# Patient Record
Sex: Male | Born: 1935 | Race: Black or African American | Hispanic: No | Marital: Married | Smoking: Former smoker
Health system: Southern US, Community
[De-identification: ages and names within clinical notes are randomized; demographics above are authoritative.]

## PROBLEM LIST (undated history)

## (undated) DIAGNOSIS — I6529 Occlusion and stenosis of unspecified carotid artery: Secondary | ICD-10-CM

## (undated) DIAGNOSIS — N529 Male erectile dysfunction, unspecified: Secondary | ICD-10-CM

## (undated) DIAGNOSIS — K449 Diaphragmatic hernia without obstruction or gangrene: Secondary | ICD-10-CM

## (undated) DIAGNOSIS — K209 Esophagitis, unspecified without bleeding: Secondary | ICD-10-CM

## (undated) DIAGNOSIS — D126 Benign neoplasm of colon, unspecified: Secondary | ICD-10-CM

## (undated) DIAGNOSIS — K2970 Gastritis, unspecified, without bleeding: Secondary | ICD-10-CM

## (undated) DIAGNOSIS — T7840XA Allergy, unspecified, initial encounter: Secondary | ICD-10-CM

## (undated) DIAGNOSIS — K222 Esophageal obstruction: Secondary | ICD-10-CM

## (undated) DIAGNOSIS — K2990 Gastroduodenitis, unspecified, without bleeding: Secondary | ICD-10-CM

## (undated) DIAGNOSIS — K219 Gastro-esophageal reflux disease without esophagitis: Secondary | ICD-10-CM

## (undated) DIAGNOSIS — E119 Type 2 diabetes mellitus without complications: Secondary | ICD-10-CM

## (undated) DIAGNOSIS — I4891 Unspecified atrial fibrillation: Secondary | ICD-10-CM

## (undated) DIAGNOSIS — C61 Malignant neoplasm of prostate: Secondary | ICD-10-CM

## (undated) DIAGNOSIS — C459 Mesothelioma, unspecified: Secondary | ICD-10-CM

## (undated) DIAGNOSIS — I10 Essential (primary) hypertension: Secondary | ICD-10-CM

## (undated) DIAGNOSIS — Z7189 Other specified counseling: Secondary | ICD-10-CM

## (undated) DIAGNOSIS — K5730 Diverticulosis of large intestine without perforation or abscess without bleeding: Secondary | ICD-10-CM

## (undated) DIAGNOSIS — I639 Cerebral infarction, unspecified: Secondary | ICD-10-CM

## (undated) HISTORY — DX: Esophagitis, unspecified: K20.9

## (undated) HISTORY — DX: Esophageal obstruction: K22.2

## (undated) HISTORY — DX: Benign neoplasm of colon, unspecified: D12.6

## (undated) HISTORY — DX: Diaphragmatic hernia without obstruction or gangrene: K44.9

## (undated) HISTORY — DX: Gastro-esophageal reflux disease without esophagitis: K21.9

## (undated) HISTORY — PX: ESOPHAGOGASTRODUODENOSCOPY (EGD) WITH ESOPHAGEAL DILATION: SHX5812

## (undated) HISTORY — PX: PROSTATE SURGERY: SHX751

## (undated) HISTORY — DX: Esophagitis, unspecified without bleeding: K20.90

## (undated) HISTORY — DX: Male erectile dysfunction, unspecified: N52.9

## (undated) HISTORY — DX: Other specified counseling: Z71.89

## (undated) HISTORY — DX: Type 2 diabetes mellitus without complications: E11.9

## (undated) HISTORY — DX: Malignant neoplasm of prostate: C61

## (undated) HISTORY — DX: Cerebral infarction, unspecified: I63.9

## (undated) HISTORY — DX: Gastritis, unspecified, without bleeding: K29.70

## (undated) HISTORY — DX: Allergy, unspecified, initial encounter: T78.40XA

## (undated) HISTORY — DX: Gastroduodenitis, unspecified, without bleeding: K29.90

## (undated) HISTORY — DX: Mesothelioma, unspecified: C45.9

## (undated) HISTORY — DX: Essential (primary) hypertension: I10

## (undated) HISTORY — DX: Occlusion and stenosis of unspecified carotid artery: I65.29

## (undated) HISTORY — DX: Diverticulosis of large intestine without perforation or abscess without bleeding: K57.30

## (undated) HISTORY — DX: Unspecified atrial fibrillation: I48.91

---

## 2000-01-04 ENCOUNTER — Other Ambulatory Visit: Admission: RE | Admit: 2000-01-04 | Discharge: 2000-01-04 | Payer: Self-pay | Admitting: Urology

## 2000-02-22 ENCOUNTER — Encounter: Admission: RE | Admit: 2000-02-22 | Discharge: 2000-05-22 | Payer: Self-pay | Admitting: Radiation Oncology

## 2000-10-16 ENCOUNTER — Encounter (HOSPITAL_COMMUNITY): Admission: RE | Admit: 2000-10-16 | Discharge: 2001-01-14 | Payer: Self-pay | Admitting: Dentistry

## 2000-10-17 ENCOUNTER — Encounter (HOSPITAL_COMMUNITY): Payer: Self-pay | Admitting: Dentistry

## 2003-03-04 ENCOUNTER — Observation Stay (HOSPITAL_COMMUNITY): Admission: EM | Admit: 2003-03-04 | Discharge: 2003-03-05 | Payer: Self-pay | Admitting: Emergency Medicine

## 2004-01-03 ENCOUNTER — Emergency Department (HOSPITAL_COMMUNITY): Admission: EM | Admit: 2004-01-03 | Discharge: 2004-01-03 | Payer: Self-pay | Admitting: Internal Medicine

## 2004-07-22 ENCOUNTER — Ambulatory Visit: Payer: Self-pay | Admitting: Family Medicine

## 2004-09-13 ENCOUNTER — Ambulatory Visit: Payer: Self-pay | Admitting: Family Medicine

## 2005-01-23 ENCOUNTER — Ambulatory Visit: Payer: Self-pay | Admitting: Family Medicine

## 2005-04-11 ENCOUNTER — Ambulatory Visit: Payer: Self-pay | Admitting: Family Medicine

## 2005-05-02 ENCOUNTER — Ambulatory Visit: Payer: Self-pay | Admitting: Family Medicine

## 2005-06-22 ENCOUNTER — Ambulatory Visit: Payer: Self-pay | Admitting: Family Medicine

## 2005-08-15 ENCOUNTER — Ambulatory Visit: Payer: Self-pay | Admitting: Family Medicine

## 2005-09-21 ENCOUNTER — Ambulatory Visit: Payer: Self-pay | Admitting: Family Medicine

## 2006-04-03 ENCOUNTER — Ambulatory Visit: Payer: Self-pay | Admitting: Family Medicine

## 2006-06-08 ENCOUNTER — Ambulatory Visit: Payer: Self-pay | Admitting: Family Medicine

## 2006-12-12 ENCOUNTER — Ambulatory Visit: Payer: Self-pay | Admitting: Family Medicine

## 2007-01-03 ENCOUNTER — Ambulatory Visit: Payer: Self-pay | Admitting: Family Medicine

## 2007-03-20 DIAGNOSIS — I10 Essential (primary) hypertension: Secondary | ICD-10-CM

## 2007-03-20 DIAGNOSIS — E785 Hyperlipidemia, unspecified: Secondary | ICD-10-CM | POA: Insufficient documentation

## 2007-03-26 ENCOUNTER — Ambulatory Visit: Payer: Self-pay | Admitting: Family Medicine

## 2007-03-26 DIAGNOSIS — N529 Male erectile dysfunction, unspecified: Secondary | ICD-10-CM

## 2007-03-26 DIAGNOSIS — Z8546 Personal history of malignant neoplasm of prostate: Secondary | ICD-10-CM

## 2007-03-27 ENCOUNTER — Encounter: Payer: Self-pay | Admitting: Family Medicine

## 2007-05-22 ENCOUNTER — Ambulatory Visit: Payer: Self-pay | Admitting: Family Medicine

## 2007-05-22 DIAGNOSIS — J45909 Unspecified asthma, uncomplicated: Secondary | ICD-10-CM | POA: Insufficient documentation

## 2007-05-22 DIAGNOSIS — K219 Gastro-esophageal reflux disease without esophagitis: Secondary | ICD-10-CM

## 2007-06-05 ENCOUNTER — Ambulatory Visit: Payer: Self-pay | Admitting: Family Medicine

## 2007-08-15 DIAGNOSIS — D126 Benign neoplasm of colon, unspecified: Secondary | ICD-10-CM

## 2007-08-15 HISTORY — DX: Benign neoplasm of colon, unspecified: D12.6

## 2007-10-15 DIAGNOSIS — R4789 Other speech disturbances: Secondary | ICD-10-CM | POA: Insufficient documentation

## 2007-10-18 ENCOUNTER — Ambulatory Visit: Payer: Self-pay | Admitting: Family Medicine

## 2007-11-12 ENCOUNTER — Ambulatory Visit: Payer: Self-pay | Admitting: Gastroenterology

## 2007-12-02 ENCOUNTER — Ambulatory Visit: Payer: Self-pay | Admitting: Gastroenterology

## 2007-12-02 ENCOUNTER — Encounter: Payer: Self-pay | Admitting: Gastroenterology

## 2007-12-02 ENCOUNTER — Encounter: Payer: Self-pay | Admitting: Family Medicine

## 2008-01-16 ENCOUNTER — Ambulatory Visit: Payer: Self-pay | Admitting: Family Medicine

## 2008-01-16 DIAGNOSIS — J309 Allergic rhinitis, unspecified: Secondary | ICD-10-CM

## 2008-03-25 ENCOUNTER — Encounter: Payer: Self-pay | Admitting: Family Medicine

## 2008-03-26 ENCOUNTER — Ambulatory Visit: Payer: Self-pay | Admitting: Family Medicine

## 2008-03-26 LAB — CONVERTED CEMR LAB
ALT: 20 units/L (ref 0–53)
Albumin: 3.8 g/dL (ref 3.5–5.2)
BUN: 19 mg/dL (ref 6–23)
Basophils Absolute: 0 10*3/uL (ref 0.0–0.1)
Basophils Relative: 0 % (ref 0.0–3.0)
Bilirubin Urine: NEGATIVE
CO2: 27 meq/L (ref 19–32)
Calcium: 9.3 mg/dL (ref 8.4–10.5)
Cholesterol: 187 mg/dL (ref 0–200)
Creatinine, Ser: 1.3 mg/dL (ref 0.4–1.5)
Eosinophils Absolute: 0.1 10*3/uL (ref 0.0–0.7)
Glucose, Urine, Semiquant: NEGATIVE
Hemoglobin: 14.2 g/dL (ref 13.0–17.0)
Lymphocytes Relative: 20.9 % (ref 12.0–46.0)
MCHC: 34.6 g/dL (ref 30.0–36.0)
MCV: 96.3 fL (ref 78.0–100.0)
Neutro Abs: 3.3 10*3/uL (ref 1.4–7.7)
PSA: 0.68 ng/mL (ref 0.10–4.00)
RBC: 4.27 M/uL (ref 4.22–5.81)
TSH: 1.59 microintl units/mL (ref 0.35–5.50)
Total Bilirubin: 0.9 mg/dL (ref 0.3–1.2)
pH: 5.5

## 2008-05-15 ENCOUNTER — Ambulatory Visit: Payer: Self-pay | Admitting: Family Medicine

## 2009-03-30 ENCOUNTER — Ambulatory Visit: Payer: Self-pay | Admitting: Internal Medicine

## 2009-03-30 DIAGNOSIS — M549 Dorsalgia, unspecified: Secondary | ICD-10-CM | POA: Insufficient documentation

## 2009-04-09 ENCOUNTER — Ambulatory Visit: Payer: Self-pay | Admitting: Family Medicine

## 2009-04-09 ENCOUNTER — Telehealth: Payer: Self-pay | Admitting: Family Medicine

## 2009-04-15 ENCOUNTER — Ambulatory Visit: Payer: Self-pay | Admitting: Family Medicine

## 2009-05-26 ENCOUNTER — Ambulatory Visit: Payer: Self-pay | Admitting: Family Medicine

## 2009-08-03 DIAGNOSIS — R1115 Cyclical vomiting syndrome unrelated to migraine: Secondary | ICD-10-CM

## 2009-08-04 ENCOUNTER — Emergency Department (HOSPITAL_COMMUNITY): Admission: EM | Admit: 2009-08-04 | Discharge: 2009-08-04 | Payer: Self-pay | Admitting: Emergency Medicine

## 2009-08-04 ENCOUNTER — Ambulatory Visit: Payer: Self-pay | Admitting: Family Medicine

## 2010-04-20 ENCOUNTER — Telehealth: Payer: Self-pay | Admitting: Family Medicine

## 2010-04-20 ENCOUNTER — Ambulatory Visit: Payer: Self-pay | Admitting: Family Medicine

## 2010-08-29 ENCOUNTER — Encounter: Payer: Self-pay | Admitting: Internal Medicine

## 2010-08-29 ENCOUNTER — Ambulatory Visit
Admission: RE | Admit: 2010-08-29 | Discharge: 2010-08-29 | Payer: Self-pay | Source: Home / Self Care | Attending: Family Medicine | Admitting: Family Medicine

## 2010-08-29 ENCOUNTER — Ambulatory Visit (HOSPITAL_COMMUNITY)
Admission: EM | Admit: 2010-08-29 | Discharge: 2010-08-29 | Payer: Self-pay | Source: Home / Self Care | Admitting: Emergency Medicine

## 2010-08-29 DIAGNOSIS — K222 Esophageal obstruction: Secondary | ICD-10-CM | POA: Insufficient documentation

## 2010-08-30 ENCOUNTER — Telehealth: Payer: Self-pay | Admitting: Internal Medicine

## 2010-08-31 ENCOUNTER — Encounter (INDEPENDENT_AMBULATORY_CARE_PROVIDER_SITE_OTHER): Payer: Self-pay | Admitting: *Deleted

## 2010-08-31 ENCOUNTER — Telehealth: Payer: Self-pay | Admitting: Gastroenterology

## 2010-09-06 ENCOUNTER — Ambulatory Visit
Admission: RE | Admit: 2010-09-06 | Discharge: 2010-09-06 | Payer: Self-pay | Source: Home / Self Care | Attending: Gastroenterology | Admitting: Gastroenterology

## 2010-09-11 LAB — CONVERTED CEMR LAB
ALT: 18 units/L (ref 0–53)
ALT: 22 units/L (ref 0–53)
ALT: 26 units/L (ref 0–53)
AST: 15 units/L (ref 0–37)
AST: 21 units/L (ref 0–37)
Albumin: 3.7 g/dL (ref 3.5–5.2)
Alkaline Phosphatase: 53 units/L (ref 39–117)
Alkaline Phosphatase: 56 units/L (ref 39–117)
BUN: 16 mg/dL (ref 6–23)
BUN: 17 mg/dL (ref 6–23)
Basophils Absolute: 0 10*3/uL (ref 0.0–0.1)
Basophils Absolute: 0 10*3/uL (ref 0.0–0.1)
Basophils Relative: 0.5 % (ref 0.0–1.0)
Bilirubin, Direct: 0.1 mg/dL (ref 0.0–0.3)
Bilirubin, Direct: 0.1 mg/dL (ref 0.0–0.3)
Blood in Urine, dipstick: NEGATIVE
CO2: 30 meq/L (ref 19–32)
Calcium: 9 mg/dL (ref 8.4–10.5)
Calcium: 9.1 mg/dL (ref 8.4–10.5)
Calcium: 9.3 mg/dL (ref 8.4–10.5)
Chloride: 104 meq/L (ref 96–112)
Chloride: 108 meq/L (ref 96–112)
Cholesterol: 203 mg/dL — ABNORMAL HIGH (ref 0–200)
Creatinine, Ser: 1.2 mg/dL (ref 0.4–1.5)
Creatinine, Ser: 1.2 mg/dL (ref 0.4–1.5)
Eosinophils Absolute: 0.2 10*3/uL (ref 0.0–0.6)
Eosinophils Relative: 1.6 % (ref 0.0–5.0)
Eosinophils Relative: 3.4 % (ref 0.0–5.0)
GFR calc Af Amer: 75 mL/min
GFR calc non Af Amer: 62 mL/min
GFR calc non Af Amer: 74.64 mL/min (ref >60)
GFR calc non Af Amer: 78.99 mL/min (ref >60)
Glucose, Bld: 109 mg/dL — ABNORMAL HIGH (ref 70–99)
Glucose, Bld: 99 mg/dL (ref 70–99)
HCT: 39.6 % (ref 39.0–52.0)
HCT: 40.6 % (ref 39.0–52.0)
HDL: 39.7 mg/dL (ref >39.00)
HDL: 46.1 mg/dL (ref >39.00)
Hemoglobin: 14.1 g/dL (ref 13.0–17.0)
Hemoglobin: 14.2 g/dL (ref 13.0–17.0)
LDL Cholesterol: 121 mg/dL — ABNORMAL HIGH (ref 0–99)
Lymphocytes Relative: 19.7 % (ref 12.0–46.0)
Lymphocytes Relative: 21.5 % (ref 12.0–46.0)
Lymphs Abs: 1.2 10*3/uL (ref 0.7–4.0)
MCHC: 35.6 g/dL (ref 30.0–36.0)
MCV: 92.6 fL (ref 78.0–100.0)
Monocytes Absolute: 0.8 10*3/uL — ABNORMAL HIGH (ref 0.2–0.7)
Monocytes Relative: 11.1 % (ref 3.0–12.0)
Monocytes Relative: 12.8 % — ABNORMAL HIGH (ref 3.0–11.0)
Neutro Abs: 3.9 10*3/uL (ref 1.4–7.7)
Neutrophils Relative %: 61.8 % (ref 43.0–77.0)
Platelets: 208 10*3/uL (ref 150.0–400.0)
Platelets: 232 10*3/uL (ref 150–400)
Potassium: 3.6 meq/L (ref 3.5–5.1)
Potassium: 3.7 meq/L (ref 3.5–5.1)
Protein, U semiquant: NEGATIVE
RBC: 4.28 M/uL (ref 4.22–5.81)
RDW: 13 % (ref 11.5–14.6)
RDW: 13.9 % (ref 11.5–14.6)
Sodium: 139 meq/L (ref 135–145)
Sodium: 140 meq/L (ref 135–145)
TSH: 1.18 microintl units/mL (ref 0.35–5.50)
TSH: 1.45 microintl units/mL (ref 0.35–5.50)
TSH: 1.96 microintl units/mL (ref 0.35–5.50)
Total Bilirubin: 0.9 mg/dL (ref 0.3–1.2)
Total Bilirubin: 1 mg/dL (ref 0.3–1.2)
Total Bilirubin: 1.2 mg/dL (ref 0.3–1.2)
Total CHOL/HDL Ratio: 5
Total Protein: 7 g/dL (ref 6.0–8.3)
Triglycerides: 134 mg/dL (ref 0.0–149.0)
Urobilinogen, UA: 0.2
VLDL: 25.8 mg/dL (ref 0.0–40.0)
VLDL: 26.8 mg/dL (ref 0.0–40.0)
WBC Urine, dipstick: NEGATIVE
WBC: 5.9 10*3/uL (ref 4.5–10.5)
WBC: 6.2 10*3/uL (ref 4.5–10.5)

## 2010-09-12 ENCOUNTER — Other Ambulatory Visit: Payer: Self-pay | Admitting: Gastroenterology

## 2010-09-12 ENCOUNTER — Other Ambulatory Visit: Payer: Self-pay | Admitting: Internal Medicine

## 2010-09-12 ENCOUNTER — Ambulatory Visit
Admission: RE | Admit: 2010-09-12 | Discharge: 2010-09-12 | Payer: Self-pay | Source: Home / Self Care | Attending: Gastroenterology | Admitting: Gastroenterology

## 2010-09-12 DIAGNOSIS — K2940 Chronic atrophic gastritis without bleeding: Secondary | ICD-10-CM

## 2010-09-12 DIAGNOSIS — A048 Other specified bacterial intestinal infections: Secondary | ICD-10-CM

## 2010-09-14 ENCOUNTER — Encounter: Payer: Self-pay | Admitting: Gastroenterology

## 2010-09-15 NOTE — Miscellaneous (Signed)
Summary: [facility]  Clinical Lists Changes  Observations: Added new observation of ALLERGY REV: Done (09/06/2010 17:07)

## 2010-09-15 NOTE — Progress Notes (Signed)
Summary: Drug Interaction Please advise with Simvastain & Verapamil  Phone Note From Pharmacy   [facility]  [address] [phone]* Reason for Call: Patient requests substitution Summary of Call: [facility] that Simvastatin 80 MG and Verapamil ER 240 MG may interact based on potential interaction Verapamil may inhibit metabolism of of Simvastatin which may result in rhabdomyolysis. Please Advise.......... Initial call taken by: Kathrynn Speed CMA,  April 20, 2010 1:36 PM  Follow-up for Phone Call        changed simvastatin to Pravachol 80 mg nightly, dispense 100 directions one nightly, refills x 3 Follow-up by: Roderick Pee MD,  April 21, 2010 8:40 AM  Additional Follow-up for Phone Call Additional follow up Details #1::        Called pharmacy informed them to change med to Pravachol 80 mg. Additional Follow-up by: Kathrynn Speed CMA,  April 21, 2010 2:19 PM

## 2010-09-15 NOTE — Procedures (Signed)
Summary: Upper Endoscopy  Patient: Thomas [name] Note: All result statuses are Final unless otherwise noted.  Tests: (1) Upper Endoscopy (EGD)   EGD Upper Endoscopy       DONE     [facility]     [address] [place], [place]  [address]           ENDOSCOPY PROCEDURE REPORT           PATIENT:  [name], [name]  MR#:  [id number]     BIRTHDATE:  September 26, 1925, 84 yrs. old  GENDER:  male           ENDOSCOPIST:  Wilhemina Bonito. Eda Keys, MD     Referred by:  Quita Skye, M.D.           PROCEDURE DATE:  08/29/2010     PROCEDURE:  EGD with foreign body removal     ASA CLASS:  Class II     INDICATIONS:  dysphagia, food impaction           MEDICATIONS:   Fentanyl 50 mcg, Versed 5 mg     TOPICAL ANESTHETIC:  Cetacaine Spray           DESCRIPTION OF PROCEDURE:   After the risks benefits and     alternatives of the procedure were thoroughly explained, informed     consent was obtained.  The Pentax Gastroscope [id number] endoscope     was introduced through the mouth and advanced to the second     portion of the duodenum, without limitations.  The instrument was     slowly withdrawn as the mucosa was fully examined.     <<PROCEDUREIMAGES>>           A meat impaction was present in distal esophagus and gently     advanced into stomch.  An underlying stricture was found in the     distal esophagus as well as macerated esophageal mucosa.  A hiatal     hernia was found.  The stomach was entered and closely examined.     The antrum, angularis, and lesser curvature were well visualized,     including a retroflexed view of the cardia and fundus. The stomach     wall was normally distensable. The scope passed easily through the     pylorus into the duodenum.  The duodenal bulb was normal in     appearance, as was the postbulbar duodenum.    Retroflexed views     revealed the hiatal hernia.    The scope was then withdrawn from     the patient and the procedure completed.        COMPLICATIONS:  None           ENDOSCOPIC IMPRESSION:     1) Meat impaction - relieved endoscopically     2) Stricture in the distal esophagus     3) Esophagitis in the distal esophagus     4) Hiatal hernia     5) Normal stomach     6) Normal duodenum           RECOMMENDATIONS:     1) Clear liquids for 24 hour then soft foods until endoscopy     with dilation can be performed.     2) Prilosec OTC 20mg  twice daily     3) Call  the office to arrange out patient esophageal dilation     with Dr Jarold Motto in about 2 weeks  _____________________________     Wilhemina Bonito. Eda Keys, MD           CC:  Quita Skye, M.D.; Jolee Ewing, Christen Butter; Tha     Patient           n.     [name]:   Wilhemina Bonito. Eda Keys at 08/29/2010 08:57 PM           [name], [phone]  Note: An exclamation mark (!) indicates a result that was not dispersed into the flowsheet. Document Creation Date: 08/29/2010 8:57 PM _______________________________________________________________________  (1) Order result status: Final Collection or observation date-time: 08/29/2010 20:47 Requested date-time:  Receipt date-time:  Reported date-time:  Referring Physician:   Ordering Physician: Fransico Setters [id number]) Specimen Source:  Source: Launa Grill Order Number: [id number] Lab site:

## 2010-09-15 NOTE — Assessment & Plan Note (Signed)
Summary: N/V // RS   Vital Signs:  Patient profile:   77 year old male Weight:      187 pounds Temp:     98.6 degrees F oral BP sitting:   150 / 100  (left arm) Cuff size:   regular  Vitals Entered By: Kern Reap CMA Duncan Dull) (August 29, 2010 4:02 PM) CC: nausea, vomitting   CC:  nausea and vomitting.  History of Present Illness: Thomas [name] is an 77 year old male, who comes in today accompanied by his wife and daughter because last night.  He got something stuck in his esophagus and it will not pass.  He's tried drinking water, but it comes right back up.  Review of systems otherwise negative.  I gave him a sublingual nitroglycerin to see if this would help  After about 3 minutes he felt lightheaded.  He was placed in the supine position.  His legs were elevated.  BP 130/90  Allergies: 1)  ! Penicillin 2)  ! Ace Inhibitors  Past History:  Past medical, surgical, family and social histories (including risk factors) reviewed for relevance to current acute and chronic problems. Past medical, surgical, family and social histories (including risk factors) reviewed, and no changes noted (except as noted below).  Past Medical History: Reviewed history from 01/16/2008 and no changes required. Hypertension ED Hyperlipidemia Abnormal PSA Prostate cancer, hx of radiation treatment Allergic rhinitis  Past Surgical History: Reviewed history from 03/20/2007 and no changes required. Colonoscopy-03/20/2003  Family History: Reviewed history from 10/18/2007 and no changes required. father died at 69 of congestive heart failure mother died in her 10s unknown cause 3 brothers one died in the [place] .  The other two in good healthone sister died of breast cancer  Social History: Reviewed history from 10/18/2007 and no changes required. Former Smoker Retired Tax inspector Married Alcohol use-no Drug use-no Regular exercise-yes  Review of Systems      See HPI  Physical  Exam  General:  Well-developed,well-nourished,in no acute distress; alert,appropriate and cooperative throughout examination Abdomen:  Bowel sounds positive,abdomen soft and non-tender without masses, organomegaly or hernias noted.   Impression & Recommendations:  Problem # 1:  STRICTURE AND STENOSIS OF ESOPHAGUS (ICD-530.3) Assessment New  Complete Medication List: 1)  Clonidine Hcl 0.2 Mg Tabs (Clonidine hcl) .Marland Kitchen.. 1 once daily 2)  Verapamil Hcl Cr 240 Mg Tbcr (Verapamil hcl) .... Take 1 tablet by mouth once a day 3)  Aspirin 325 Mg Tabs (Aspirin) .... Take 1 tablet by mouth once a day 4)  Ditropan Xl 10 Mg Tb24 (Oxybutynin chloride) .... Take 1 tablet by mouth once a day 5)  Hydrochlorothiazide 25 Mg Tabs (Hydrochlorothiazide) .... Take 1 tablet by mouth once a day 6)  Advil  .... As needed 7)  Crestor 20 Mg Tabs (Rosuvastatin calcium) .... Take one tab by mouth at bedtime  Patient Instructions: 1)  go directly to the emergency room since there is a total obstruction of your esophagus   Orders Added: 1)  Est. Patient Level IV [[address]]

## 2010-09-15 NOTE — Progress Notes (Signed)
Summary: Appointments   Phone Note Call from Patient Call back at Home Phone [phone]   Caller: Patients daughter Call For: Dr. Marina Goodell Reason for Call: Talk to Nurse Summary of Call: Pts daughter calling, pt had emergency procedure last night and was told to follow up with Marina Goodell in two weeks Initial call taken by: [place] Zaro,  August 30, 2010 9:53 AM  Follow-up for Phone Call        Pt. is to have dil. in 2 weeks by Dr.Patterson per procedure report. Follow-up by: Teryl Lucy RN,  August 30, 2010 10:19 AM  Additional Follow-up for Phone Call Additional follow up Details #1::        Spoke with patient's daughter, Marily Memos, to inform her of appoinments with Dr Jarold Motto for EGD w/ dil on 09/12/10 and with the Pre Visit RN on 09/05/10.  Also, went over Dr Lamar Sprinkles diet instructions order for OTC Prilosec. Marily Memos will call for further questions. Dr Jarold Motto has not seen this patient since 12/02/2007, when he had a EGD w/ [facility]. Additional Follow-up by: Graciella Freer RN,  August 30, 2010 10:52 AM

## 2010-09-15 NOTE — Letter (Signed)
Summary: EGD Instructions  [facility]  [address]   [place], [place] [address]   Phone: [phone]  Fax: [phone]       NIVIN BRANIFF    1925/10/19    MRN: [id number]       Procedure Day Thomas [name]: Monday, 09-12-10     Arrival Time: 1:00 p.m.     Procedure Time: 2:00 p.m.     Location of Procedure:                     x [facility] (4th Floor)    PREPARATION FOR ENDOSCOPY   On 09-12-10  THE DAY OF THE PROCEDURE:  1.   No solid foods, milk or milk products are allowed after midnight the night before your procedure.  2.   Do not drink anything colored red or purple.  Avoid juices with pulp.  No orange juice.  3.  You may drink clear liquids until 12:00 p.m., which is 2 hours before your procedure.                                                                                                CLEAR LIQUIDS INCLUDE: Water Jello Ice Popsicles Tea (sugar ok, no milk/cream) Powdered fruit flavored drinks Coffee (sugar ok, no milk/cream) Gatorade Juice: apple, white grape, white cranberry  Lemonade Clear bullion, consomm, broth Carbonated beverages (any kind) Strained chicken noodle soup Hard Candy   MEDICATION INSTRUCTIONS  Unless otherwise instructed, you should take regular prescription medications with a small sip of water as early as possible the morning of your procedure.   Additional medication instructions: Do not take HCTZ day of procedure.             OTHER INSTRUCTIONS  You will need a responsible adult at least 75 years of age to accompany you and drive you home.   This person must remain in the waiting room during your procedure.  Wear loose fitting clothing that is easily removed.  Leave jewelry and other valuables at home.  However, you may wish to bring a book to read or an iPod/MP3 player to listen to music as you wait for your procedure to start.  Remove all body piercing jewelry and leave at home.  Total  time from sign-in until discharge is approximately 2-3 hours.  You should go home directly after your procedure and rest.  You can resume normal activities the day after your procedure.  The day of your procedure you should not:   Drive   Make legal decisions   Operate machinery   Drink alcohol   Return to work  You will receive specific instructions about eating, activities and medications before you leave.    The above instructions have been reviewed and explained to me by   Ezra Sites RN  September 06, 2010 4:36 PM     I fully understand and can verbalize these instructions _____________________________ Date _________

## 2010-09-15 NOTE — Progress Notes (Signed)
Summary: triage   Phone Note Call from Patient Call back at cell [phone]   Caller: Daughter Jasmine December Call For: Dr Jarold Motto Reason for Call: Talk to Nurse Summary of Call: Patient's daughter states that her dad  has severe diarrhea since colon on January 16, wants to know if that's normal. Initial call taken by: Tawni Levy,  August 31, 2010 3:18 PM  Follow-up for Phone Call        Spoke with daughter Jasmine December who stated patient reported 3 stools today- he was not present to describe the consistency. He was on a liquid diet until 9PM last night and then started on soft foods. Patient has not started Prilosec, they are at the store and I asked her to  buy Imodium to keep on hand. If patient's stools become watery, take Imodium per box and update [place] tomorrow. Per [name]'s request, r/s Pre Visit appointment. Follow-up by: Graciella Freer RN,  August 31, 2010 4:04 PM

## 2010-09-15 NOTE — Assessment & Plan Note (Signed)
Summary: pt will come in fasting/njr   Vital Signs:  Patient profile:   77 year old male Height:      68 inches Weight:      196 pounds BMI:     29.91 Temp:     98.2 degrees F oral BP sitting:   130 / 80  (left arm) Cuff size:   regular  Vitals Entered By: Kathrynn Speed CMA (April 20, 2010 8:52 AM) CC: cpx, fasting for labs, src Is Patient Diabetic? No   CC:  cpx, fasting for labs, and src.  History of Present Illness: Thomas [name] is an 77 year old male, who comes in today for general physical examination because of underlying hypertension and hyperlipidemia, and BPH.  His hypertension is treated with verapamil 240 mg daily, Lipitor, thiazide 25 mg daily, clonidine, .2 daily.  BP 130/80.  He also takes ditropan  10 mg daily for BPH, and one aspirin tablet.  He also takes Zocor 80 nightly for hyperlipidemia.  He sees urologist once yearly  Tetanus 2010, seasonal flu shot today, Pneumovax 2009, colonoscopy, a couple years ago, normal. Here for Medicare AWV:  1.   Risk factors based on Past M, S, F history:..reviewed in detail.  No change 2.   Physical Activities: walks daily 3.   Depression/mood: good mood.  No depression 4.   Hearing: normal 5.   ADL's: reviewed able to function normally take care of household finances 6.   Fall Risk:  reviewed.  None 7.   Home Safety: reviewed.  No guns in the house 8.   Height, weight, &visual acuity:height weight, normal.  Vision normal 9.   Counseling: continue current medications 10.   Labs ordered based on risk factors: done today 11.           Referral Coordination..........none indicated 12.           Care Plan.........Marland Kitchenreviewed medications 13.            Cognitive Assessment ...........normal mentation, living will power of attorney, done  Preventive Screening-Counseling & Management  Alcohol-Tobacco     Smoking Status: quit     Passive Smoke Exposure: no  Current Medications (verified): 1)  Clonidine Hcl 0.2 Mg Tabs  (Clonidine Hcl) .Marland Kitchen.. 1 Once Daily 2)  Verapamil Hcl Cr 240 Mg  Tbcr (Verapamil Hcl) .... Take 1 Tablet By Mouth Once A Day 3)  Aspirin 325 Mg  Tabs (Aspirin) .... Take 1 Tablet By Mouth Once A Day 4)  Ditropan Xl 10 Mg  Tb24 (Oxybutynin Chloride) .... Take 1 Tablet By Mouth Once A Day 5)  Hydrochlorothiazide 25 Mg  Tabs (Hydrochlorothiazide) .... Take 1 Tablet By Mouth Once A Day 6)  Zocor 80 Mg  Tabs (Simvastatin) .... Take 1 Tablet By Mouth Once A Day 7)  Advil .... As Needed  Allergies (verified): 1)  ! Penicillin 2)  ! Ace Inhibitors  Past History:  Past medical, surgical, family and social histories (including risk factors) reviewed, and no changes noted (except as noted below).  Past Medical History: Reviewed history from 01/16/2008 and no changes required. Hypertension ED Hyperlipidemia Abnormal PSA Prostate cancer, hx of radiation treatment Allergic rhinitis  Past Surgical History: Reviewed history from 03/20/2007 and no changes required. Colonoscopy-03/20/2003  Family History: Reviewed history from 10/18/2007 and no changes required. father died at 66 of congestive heart failure mother died in her 65s unknown cause 3 brothers one died in the [place] .  The other two in good healthone sister died  of breast cancer  Social History: Reviewed history from 10/18/2007 and no changes required. Former Smoker Retired Tax inspector Married Alcohol use-no Drug use-no Regular exercise-yes  Review of Systems      See HPI       Flu Vaccine Consent Questions     Do you have a history of severe allergic reactions to this vaccine? no    Any prior history of allergic reactions to egg and/or gelatin? no    Do you have a sensitivity to the preservative Thimersol? no    Do you have a past history of Guillan-Barre Syndrome? no    Do you currently have an acute febrile illness? no    Have you ever had a severe reaction to latex? no    Vaccine information given and explained to patient?  yes    Are you currently pregnant? no    Lot Number:[id number]   Exp Date:02/11/2011   Site Given  Left Deltoid IM   Physical Exam  General:  Well-developed,well-nourished,in no acute distress; alert,appropriate and cooperative throughout examination Head:  Normocephalic and atraumatic without obvious abnormalities. No apparent alopecia or balding. Eyes:  No corneal or conjunctival inflammation noted. EOMI. Perrla. Funduscopic exam benign, without hemorrhages, exudates or papilledema. Vision grossly normal. Ears:  External ear exam shows no significant lesions or deformities.  Otoscopic examination reveals clear canals, tympanic membranes are intact bilaterally without bulging, retraction, inflammation or discharge. Hearing is grossly normal bilaterally. Nose:  External nasal examination shows no deformity or inflammation. Nasal mucosa are pink and moist without lesions or exudates. Mouth:  Oral mucosa and oropharynx without lesions or exudates.  Teeth in good repair. Neck:  No deformities, masses, or tenderness noted. Chest Wall:  No deformities, masses, tenderness or gynecomastia noted. Breasts:  No masses or gynecomastia noted Lungs:  Normal respiratory effort, chest expands symmetrically. Lungs are clear to auscultation, no crackles or wheezes. Heart:  Normal rate and regular rhythm. S1 and S2 normal without gallop, murmur, click, rub or other extra sounds. Abdomen:  Bowel sounds positive,abdomen soft and non-tender without masses, organomegaly or hernias noted. Rectal:  uro Genitalia:  uro Prostate:  uro Msk:  No deformity or scoliosis noted of thoracic or lumbar spine.   Pulses:  R and L carotid,radial,femoral,dorsalis pedis and posterior tibial pulses are full and equal bilaterally Extremities:  No clubbing, cyanosis, edema, or deformity noted with normal full range of motion of all joints.   Neurologic:  No cranial nerve deficits noted. Station and gait are normal. Plantar reflexes  are down-going bilaterally. DTRs are symmetrical throughout. Sensory, motor and coordinative functions appear intact. Skin:  Intact without suspicious lesions or rashes Cervical Nodes:  No lymphadenopathy noted Axillary Nodes:  No palpable lymphadenopathy Inguinal Nodes:  No significant adenopathy Psych:  Cognition and judgment appear intact. Alert and cooperative with normal attention span and concentration. No apparent delusions, illusions, hallucinations   Impression & Recommendations:  Problem # 1:  HYPERTENSION (ICD-401.9) Assessment Improved  His updated medication list for this problem includes:    Clonidine Hcl 0.2 Mg Tabs (Clonidine hcl) .Marland Kitchen... 1 once daily    Verapamil Hcl Cr 240 Mg Tbcr (Verapamil hcl) .Marland Kitchen... Take 1 tablet by mouth once a day    Hydrochlorothiazide 25 Mg Tabs (Hydrochlorothiazide) .Marland Kitchen... Take 1 tablet by mouth once a day  Orders: EKG w/ Interpretation ([address]) Venipuncture ([address]) Prescription Created Electronically [phone]) Medicare -1st Annual Wellness Visit [phone]) Urinalysis-dipstick only (Medicare patient) (91478GN) TLB-Lipid Panel (80061-LIPID) TLB-BMP (Basic Metabolic Panel-BMET) (80048-METABOL) TLB-CBC  Platelet - w/Differential (85025-CBCD) TLB-Hepatic/Liver Function Pnl (80076-HEPATIC) TLB-TSH (Thyroid Stimulating Hormone) (84443-TSH)  Problem # 2:  HYPERLIPIDEMIA (ICD-272.4) Assessment: Improved  His updated medication list for this problem includes:    Zocor 80 Mg Tabs (Simvastatin) .Marland Kitchen... Take 1 tablet by mouth once a day  Orders: EKG w/ Interpretation ([address]) Venipuncture (54098) Prescription Created Electronically [phone]) Medicare -1st Annual Wellness Visit [phone]) Urinalysis-dipstick only (Medicare patient) (62130QM) TLB-Lipid Panel (80061-LIPID) TLB-BMP (Basic Metabolic Panel-BMET) (80048-METABOL) TLB-CBC Platelet - w/Differential (85025-CBCD) TLB-Hepatic/Liver Function Pnl (80076-HEPATIC) TLB-TSH (Thyroid Stimulating Hormone)  (84443-TSH)  Problem # 3:  Preventive Health Care (ICD-V70.0) Assessment: Unchanged  Complete Medication List: 1)  Clonidine Hcl 0.2 Mg Tabs (Clonidine hcl) .Marland Kitchen.. 1 once daily 2)  Verapamil Hcl Cr 240 Mg Tbcr (Verapamil hcl) .... Take 1 tablet by mouth once a day 3)  Aspirin 325 Mg Tabs (Aspirin) .... Take 1 tablet by mouth once a day 4)  Ditropan Xl 10 Mg Tb24 (Oxybutynin chloride) .... Take 1 tablet by mouth once a day 5)  Hydrochlorothiazide 25 Mg Tabs (Hydrochlorothiazide) .... Take 1 tablet by mouth once a day 6)  Zocor 80 Mg Tabs (Simvastatin) .... Take 1 tablet by mouth once a day 7)  Advil  .... As needed  Other Orders: Admin 1st Vaccine (57846) Flu Vaccine 34yrs + ([address]) Specimen Handling (28413)  Patient Instructions: 1)  Please schedule a follow-up appointment in 1 year. Prescriptions: ZOCOR 80 MG  TABS (SIMVASTATIN) Take 1 tablet by mouth once a day  #100 x 3   Entered and Authorized by:   Roderick Pee MD   Signed by:   Roderick Pee MD on 04/20/2010   Method used:   [facility]        [facility]  [address] [phone]* (retail)       [address]       [place], [place]  [address]       Ph: [phone] or [phone]       Fax: [phone]   RxID:   [id number] HYDROCHLOROTHIAZIDE 25 MG  TABS (HYDROCHLOROTHIAZIDE) Take 1 tablet by mouth once a day  #100 x 3   Entered and Authorized by:   Roderick Pee MD   Signed by:   Roderick Pee MD on 04/20/2010   Method used:   [facility]        [facility]  [address] [phone]* (retail)       [address]       [place], [place]  [address]       Ph: [phone] or [phone]       Fax: [phone]   RxID:   [id number] DITROPAN XL 10 MG  TB24 (OXYBUTYNIN CHLORIDE) Take 1 tablet by mouth once a day  #100 x 3   Entered and Authorized by:   Roderick Pee MD   Signed by:   Roderick Pee MD on 04/20/2010   Method used:   [facility]         [facility]  [address] [phone]* (retail)       [address]       [place], [place]  [address]       Ph: [phone] or [phone]       Fax: [phone]   RxID:   [id number] VERAPAMIL HCL CR 240 MG  TBCR (VERAPAMIL HCL) Take 1 tablet by mouth once a day  #100 x 3   Entered and Authorized by:   Roderick Pee MD   Signed by:   Roderick Pee MD on 04/20/2010   Method used:   [facility]ron[facility] to        [facility]  [address] [phone]* (retail)       [address]       [place], [place]  [address]       Ph: [phone] or [phone]       Fax: [phone]   RxID:   [id number] CLONIDINE HCL 0.2 MG TABS (CLONIDINE HCL) 1 once daily  #100 x 3   Entered and Authorized by:   Roderick Pee MD   Signed by:   Roderick Pee MD on 04/20/2010   Method used:   [facility]        [facility]  [address] [phone]* (retail)       [address]       [place], [place]  [address]       Ph: [phone] or [phone]       Fax: [phone]   RxID:   [id number]     Laboratory Results   Urine Tests  Date/Time Recieved: April 20, 2010 12:11 PM  Date/Time Reported: April 20, 2010 12:11 PM   Routine Urinalysis   Color: yellow Appearance: Clear Glucose: negative   (Normal Range: Negative) Bilirubin: negative   (Normal Range: Negative) Ketone: negative   (Normal Range: Negative) Spec. Gravity: 1.025   (Normal Range: 1.003-1.035) Blood: negative   (Normal Range: Negative) pH: 5.0   (Normal Range: 5.0-8.0) Protein: negative   (Normal Range: Negative) Urobilinogen: 0.2   (Normal Range: 0-1) Nitrite: negative   (Normal Range: Negative) Leukocyte Esterace: negative   (Normal Range: Negative)    Comments: Wynona Canes, CMA  April 20, 2010 12:11 PM

## 2010-09-21 NOTE — Letter (Addendum)
Summary: Patient Notice-Endo Biopsy Results  [facility]  [address] [place], [place] [address]   Phone: [phone]  Fax: [phone]        September 14, 2010 MRN: [id number]    Thomas [name] [address] [place], [place]  [address]    Dear Thomas [name],  I am pleased to inform you that the biopsies taken during your recent endoscopic examination did not show any evidence of cancer upon pathologic examination.  Additional information/recommendations:  __No further action is needed at this time.  Please follow-up with      your primary care physician for your other healthcare needs.  __ Please call [phone] to schedule a return visit to review      your condition.  x__ Continue with the treatment plan as outlined on the day of your      exam.  __ You should have a repeat endoscopic examination for this problem              in _ months/years.   Please call us if you are having persistent problems or have questions about your condition that have not been fully answered at this time.  Sincerely,  Mardella Layman MD [facility]  This letter has been electronically signed by your physician.  Appended Document: Patient Notice-Endo Biopsy Results letter mailed

## 2010-09-21 NOTE — Procedures (Addendum)
Summary: Upper Endoscopy w/DIL  Patient: Thomas [name] Note: All result statuses are Final unless otherwise noted.  Tests: (1) Upper Endoscopy w/DIL (UED)  UED Upper Endoscopy w/DIL                             DONE     [facility]     [address]     [place], [place]  [address]           ENDOSCOPY PROCEDURE REPORT           PATIENT:  [name], [name]  MR#:  [id number]     BIRTHDATE:  10-11-1925, 84 yrs. old  GENDER:  male           ENDOSCOPIST:  Vania Rea. Jarold Motto, MD, Clementeen Graham     ASSISTANT:  Maryan Char           PROCEDURE DATE:  09/12/2010     PROCEDURE:  EGD with biopsy for H. pylori, Esophaggoscopy with     balloon dilation (<12mm), Maloney Dilation of the Esophagus     ASA CLASS:  Class II     INDICATIONS:  1) foreign body  2) dysphagia           MEDICATIONS:   Fentanyl 50 mcg IV, Versed 5 mg IV     TOPICAL ANESTHETIC:  Exactacain Spray           DESCRIPTION OF PROCEDURE:   After the risks benefits and     alternatives of the procedure were thoroughly explained, informed     consent was obtained.  The LB GIF-H180 [id number] endoscope was     introduced through the mouth and advanced to the second portion of     the duodenum, without limitations.  The instrument was slowly     withdrawn as the mucosa was carefully examined.     <<PROCEDUREIMAGES>>           Severe gastritis was found in the body and the antrum of the     stomach. CLO AND REGULAR BIOPSIES DONE.  A hiatal hernia was     found. 5 -7 CM HH NOTED.  A stricture was found at the     gastroesophageal junction. It was circumferential and firm. VERY     TIGHT DISTAL STRICTURE DILATED Maloney dilation was performed. 58F     MALONEY.HARD TO PASS.RESCOPED AND CRE GUIDEWIRE BALLON DILATION     6PSI FOR 2 MTS.SIZE    Dilation was then performed at the     gastroesphageal junction           1) Dilator:  Elease Hashimoto  Size(s):     Resistance:  moderate  Heme:  none     Appearance:  no effect seen     2)  Dilator:  Balloon over guidewire  Size(s):     Resistance:  moderate  Heme:  none     Appearance:  adequate           COMPLICATIONS:  None           ENDOSCOPIC IMPRESSION:     1) Severe gastritis in the body and the antrum of the stomach     2) Hiatal hernia     3) Stricture at the gastroesophageal junction     1.R/O H.PYLORI     2.GERD AND STRICTURE     RECOMMENDATIONS:     1) await pathology results  2) dilatations PRN     3) Rx CLO if positive     4) post dilation instructions     PPI RX.           REPEAT EXAM:  No           ______________________________     Vania Rea. Jarold Motto, MD, Clementeen Graham           CC:  Yancey Flemings, MDJeffrey Shawnie Dapper, MD           n.     [name] at 09/12/2010 02:35 PM           [name], [phone]  Note: An exclamation mark (!) indicates a result that was not dispersed into the flowsheet. Document Creation Date: 09/12/2010 2:36 PM _______________________________________________________________________  (1) Order result status: Final Collection or observation date-time: 09/12/2010 14:21 Requested date-time:  Receipt date-time:  Reported date-time:  Referring Physician:   Ordering Physician: Sheryn Bison [phone]) Specimen Source:  Source: Launa Grill Order Number: [id number] Lab site:

## 2010-09-21 NOTE — Miscellaneous (Signed)
Summary: Orders Update  Clinical Lists Changes  Orders: Added new Test order of TLB-H Pylori Screen Gastric Biopsy (83013-CLOTEST) - Signed 

## 2010-11-14 LAB — DIFFERENTIAL
Basophils Absolute: 0 10*3/uL (ref 0.0–0.1)
Eosinophils Absolute: 0.1 10*3/uL (ref 0.0–0.7)
Lymphocytes Relative: 15 % (ref 12–46)
Lymphs Abs: 1.1 10*3/uL (ref 0.7–4.0)
Neutrophils Relative %: 71 % (ref 43–77)

## 2010-11-14 LAB — LIPASE, BLOOD: Lipase: 16 U/L (ref 11–59)

## 2010-11-14 LAB — URINALYSIS, ROUTINE W REFLEX MICROSCOPIC
Ketones, ur: 40 mg/dL — AB
Leukocytes, UA: NEGATIVE
Nitrite: NEGATIVE
Protein, ur: 30 mg/dL — AB
Urobilinogen, UA: 1 mg/dL (ref 0.0–1.0)
pH: 6 (ref 5.0–8.0)

## 2010-11-14 LAB — COMPREHENSIVE METABOLIC PANEL
ALT: 21 U/L (ref 0–53)
CO2: 23 mEq/L (ref 19–32)
Calcium: 9.2 mg/dL (ref 8.4–10.5)
Chloride: 107 mEq/L (ref 96–112)
Creatinine, Ser: 1.26 mg/dL (ref 0.4–1.5)
GFR calc non Af Amer: 55 mL/min — ABNORMAL LOW (ref >60)
Glucose, Bld: 94 mg/dL (ref 70–99)
Total Bilirubin: 1.5 mg/dL — ABNORMAL HIGH (ref 0.3–1.2)

## 2010-11-14 LAB — POCT I-STAT, CHEM 8
Calcium, Ion: 1.15 mmol/L (ref 1.12–1.32)
Chloride: 110 mEq/L (ref 96–112)
Glucose, Bld: 97 mg/dL (ref 70–99)
HCT: 47 % (ref 39.0–52.0)
Hemoglobin: 16 g/dL (ref 13.0–17.0)

## 2010-11-14 LAB — CBC
HCT: 45.3 % (ref 39.0–52.0)
Hemoglobin: 15.7 g/dL (ref 13.0–17.0)
MCHC: 34.6 g/dL (ref 30.0–36.0)
MCV: 96.5 fL (ref 78.0–100.0)
RBC: 4.7 MIL/uL (ref 4.22–5.81)

## 2010-11-14 LAB — LACTIC ACID, PLASMA: Lactic Acid, Venous: 1.8 mmol/L (ref 0.5–2.2)

## 2010-12-27 NOTE — Assessment & Plan Note (Signed)
[name]                         GASTROENTEROLOGY OFFICE NOTE   Thomas [name], Thomas [name]                      MRN:          [id number]  DATE:11/12/2007                            DOB:          October 28, 1925    Thomas [name] is an 77 year old black male who was referred through the  courtesy of Dr. Tawanna Cooler for midabdominal pain and early satiety.   Thomas [name] for the last 3 months has had some midabdominal pain shortly  after eating with early satiety, some nausea but no real vomiting.  He  really denies true dysphagia, dyspepsia, or hepatobiliary complaints.  We have seen him in the past, and he had a GI bleed from diverticulosis  in 2004.  At the time of his colonoscopy on March 20, 2003, he had a  couple of rather large colon polyps removed.  He has not had followup  since that time.  He was sent a letter for followup colonoscopy in  August of 2007.  In any case, his bowels are moving well, and he denies  melena or hematochezia.  I cannot see where he has had previous  endoscopic exam.   At the time of his colonoscopy, he had some hypotension problems and was  referred to Dr. Chales Abrahams.  He underwent extensive cardiac evaluations,  including stress echocardiograms.  It was felt that the patient's  bradycardia at the time of his colonoscopy was related to hypertension  and hyperlipidemia, and he is currently on multiple medications and has  had no further cardiovascular issues.   The patient's specifically denies reflux symptoms, clay-colored stools,  dark urine, icterus, anorexia, or weight loss.  He denies any specific  food intolerances.  On reviewing his chart, I cannot see previous upper  GI series or ultrasound exams.  He did have recent labs by Dr. Tawanna Cooler  which showed a normal CBC and metabolic profile.   PAST MEDICAL HISTORY:  The patient has essential hypertension and  hypercholesterolemia but otherwise has been in good health and has had  no other  medical problems or surgical procedures.  He does have a  history of BPH, prostate cancer, and has had external beam radiation.  He is followed closely by Dr. Alonza Smoker at this time.   FAMILY HISTORY:  Noncontributory.   SOCIAL HISTORY:  He is married and lives with his wife.  He has a high  school education and is retired from [employer].  Does not smoke and uses  ethanol socially.  He denies problems with ethanol abuse or dependency.   REVIEW OF SYSTEMS:  Noncontributory, except for some excessive urination  and shortness of breath with exertion.  He otherwise denies any active  cardiovascular, pulmonary, neurologic, psychiatric, endocrine,  dermatologic problems.   PHYSICAL EXAMINATION:  GENERAL:  He is a healthy-appearing black male  appearing much younger than his stated age.  VITAL SIGNS:  He is 5 feet 10 inches and weighs 197.6 pounds.  Blood  pressure is 126/82, pulse 60 and regular.  SKIN:  I could not appreciate stigmata of chronic liver disease.  NECK:  No thyromegaly.  CHEST:  Entirely clear.  HEART:  He was in a regular rhythm without murmurs, gallops, or rubs.  ABDOMEN:  I could not appreciate hepatosplenomegaly, abdominal masses,  or tenderness.  He did have a small umbilical hernia noted.  Bowel  sounds were normal.  EXTREMITIES:  Peripheral extremities were unremarkable.  RECTUM:  Inspection of his rectum was unremarkable as was his rectal  exam without masses or tenderness.  There was soft stool present, and it  was guaiac-negative.  NEUROLOGIC:  Mental status was clear.   ASSESSMENT:  1. Thomas [name] has midabdominal pain with early satiety and certainly      suggests a partial gastric outlet obstruction.  He does not abuse      nonsteroidal anti-inflammatory drugs and certainly gives no history      of any dyspepsia or peptic ulcer disease-type symptomatology.  2. History of prominent colon polyps with past due colonoscopy      followup due.  3. Previous  gastrointestinal bleeding from diverticular hemorrhage.  4. Well-controlled essential hypertension and hyperlipidemia.  5. History of prostate carcinoma with radiation therapy.   RECOMMENDATIONS:  1. Outpatient endoscopy and colonoscopy at his convenience.  2. Step 3 gastroparesis diet until workup can be completed.  3. Continue multiple other medications per Dr. Tawanna Cooler.     Vania Rea. Jarold Motto, MD, Caleen Essex, FAGA  Electronically Signed    DRP/MedQ  DD: 11/12/2007  DT: 11/12/2007  Job #: [id number]   cc:   Tinnie Gens A. Tawanna Cooler, MD  Veverly Fells. Vernie Ammons, M.D.

## 2010-12-30 NOTE — Discharge Summary (Signed)
   NAME:  Thomas [name], Thomas [name] NO.:  [id number]   MEDICAL RECORD NO.:  [id number]                   PATIENT TYPE:  INP   LOCATION:  5154                                 FACILITY:  [facility]   PHYSICIAN:  Stacie Glaze, M.D. [facility]           DATE OF BIRTH:  [birth year]   DATE OF ADMISSION:  03/04/2003  DATE OF DISCHARGE:                                 DISCHARGE SUMMARY   DISCHARGE DIAGNOSES:  1. Painless rectal bleeding.  2. Probable diverticular bleed.  3. Anemia.   BRIEF ADMISSION HISTORY:  [name] is a 77 year old African-American male  who presented with a one day history of bright red blood per rectum. The  patient was found to have heme positive stools and a hemoglobin of 11.5.   PAST MEDICAL HISTORY:  1. Hypertension.  2. Benign prostatic hypertrophy.  3. Prostate cancer status post XRT.  4. Hyperlipidemia.  5. Status post colonoscopy in 2001 that was reportedly negative.   HOSPITAL COURSE:  1. Gastrointestinal. The patient presented with self-limited painless rectal     bleeding presumed to be diverticular. The patient has remained     hemodynamically stable. It is not felt that he needed GI evaluation at     this time but should perhaps followup with Dr. Jarold Motto as an     outpatient. The patient was felt to be stable for discharge.   DISCHARGE LABORATORY DATA:  Hemoglobin 10.9, hematocrit 31.2.   MEDICATIONS AT DISCHARGE:  1. Ditropan XL 10 mg q.d.  2. Lipitor 20 mg q.d.  3. Isoptin 240 mg q.d.  4. Aspirin 325 mg q.d. to be held for seven days.    FOLLOW UP:  Follow up with Dr. Tawanna Cooler on Monday, March 09, 2003 at 12:30 p.m.  and should have a CBC drawn at that time and perhaps need for referral to  see Dr. Jarold Motto.     Cornell Barman, P.A. [facility]                  Stacie Glaze, M.D. [facility]    LC/MEDQ  D:  03/05/2003  T:  03/05/2003  Job:  [id number]   cc:   Tinnie Gens A. Tawanna Cooler, M.D. [facility]   Vania Rea. Jarold Motto, M.D. [facility]    cc:   Eugenio Hoes.  Tawanna Cooler, M.D. [facility]   Vania Rea. Jarold Motto, M.D. [facility]

## 2011-01-17 ENCOUNTER — Ambulatory Visit (INDEPENDENT_AMBULATORY_CARE_PROVIDER_SITE_OTHER): Payer: Medicare Other | Admitting: Family Medicine

## 2011-01-17 ENCOUNTER — Encounter: Payer: Self-pay | Admitting: Internal Medicine

## 2011-01-17 DIAGNOSIS — J45909 Unspecified asthma, uncomplicated: Secondary | ICD-10-CM

## 2011-01-17 DIAGNOSIS — J309 Allergic rhinitis, unspecified: Secondary | ICD-10-CM

## 2011-01-17 MED ORDER — PREDNISONE 20 MG PO TABS: ORAL_TABLET | ORAL | Status: DC

## 2011-01-17 NOTE — Patient Instructions (Signed)
Beginning the prednisone today.  Return p.r.n.

## 2011-01-17 NOTE — Progress Notes (Signed)
  Subjective:    Patient ID: Thomas [name], male    DOB: [birth year], 77 y.o.   MRN: [id number]  HPI  Thomas [name] is a delightful, 77 year old male, who comes in today for a flareup of his asthma.  He has chronic allergic rhinitis, for which he takes a daily antihistamine and he has an occasional flare of his asthma.  Last Saturday.  He was mowing the grass, which he has been noted to be allergic to in the past and that evening developed coughing and wheezing.  Review of systems otherwise negative    Review of Systems General and no immunologic review of systems otherwise negative   Objective:   Physical Exam    Well developed, well nourished, male in no acute distress.  Inspiratory rate 12.  No shortness of breath.  HEENT negative.  Neck supple.  No adenopathy.  Lungs were clear except for some mild late expiratory wheezing    Assessment & Plan:  Asthma,,,,,, prednisone burst and taper return p.r.n.

## 2011-04-27 ENCOUNTER — Ambulatory Visit (INDEPENDENT_AMBULATORY_CARE_PROVIDER_SITE_OTHER): Payer: Medicare Other | Admitting: Family Medicine

## 2011-04-27 ENCOUNTER — Encounter: Payer: Self-pay | Admitting: Family Medicine

## 2011-04-27 DIAGNOSIS — Z23 Encounter for immunization: Secondary | ICD-10-CM

## 2011-04-27 DIAGNOSIS — Z0000 Encounter for general adult medical examination without abnormal findings: Secondary | ICD-10-CM

## 2011-04-27 DIAGNOSIS — Z8546 Personal history of malignant neoplasm of prostate: Secondary | ICD-10-CM

## 2011-04-27 DIAGNOSIS — I10 Essential (primary) hypertension: Secondary | ICD-10-CM

## 2011-04-27 DIAGNOSIS — E785 Hyperlipidemia, unspecified: Secondary | ICD-10-CM

## 2011-04-27 LAB — BASIC METABOLIC PANEL
CO2: 27 mEq/L (ref 19–32)
Calcium: 9.4 mg/dL (ref 8.4–10.5)
Creatinine, Ser: 1.2 mg/dL (ref 0.4–1.5)
GFR: 71.51 mL/min (ref >60.00)
Sodium: 141 mEq/L (ref 135–145)

## 2011-04-27 LAB — PSA: PSA: 1.57 ng/mL (ref 0.10–4.00)

## 2011-04-27 LAB — CBC WITH DIFFERENTIAL/PLATELET
Basophils Relative: 0.4 % (ref 0.0–3.0)
Eosinophils Absolute: 0.1 10*3/uL (ref 0.0–0.7)
Eosinophils Relative: 1.6 % (ref 0.0–5.0)
Hemoglobin: 15.2 g/dL (ref 13.0–17.0)
Lymphocytes Relative: 20.3 % (ref 12.0–46.0)
MCHC: 33.3 g/dL (ref 30.0–36.0)
Monocytes Relative: 12.5 % — ABNORMAL HIGH (ref 3.0–12.0)
Neutro Abs: 5.2 10*3/uL (ref 1.4–7.7)
Neutrophils Relative %: 65.2 % (ref 43.0–77.0)
RBC: 4.62 Mil/uL (ref 4.22–5.81)
WBC: 8 10*3/uL (ref 4.5–10.5)

## 2011-04-27 LAB — LIPID PANEL
HDL: 52 mg/dL (ref >39.00)
Total CHOL/HDL Ratio: 4

## 2011-04-27 LAB — HEPATIC FUNCTION PANEL
ALT: 16 U/L (ref 0–53)
AST: 15 U/L (ref 0–37)
Albumin: 4.2 g/dL (ref 3.5–5.2)
Alkaline Phosphatase: 53 U/L (ref 39–117)
Bilirubin, Direct: 0.2 mg/dL (ref 0.0–0.3)
Total Protein: 7.5 g/dL (ref 6.0–8.3)

## 2011-04-27 LAB — POCT URINALYSIS DIPSTICK
Bilirubin, UA: NEGATIVE
Ketones, UA: NEGATIVE
Leukocytes, UA: NEGATIVE
Nitrite, UA: NEGATIVE
pH, UA: 5.5

## 2011-04-27 MED ORDER — VERAPAMIL HCL 240 MG PO TBCR: 240.0000 mg | EXTENDED_RELEASE_TABLET | Freq: Every day | ORAL | Status: DC

## 2011-04-27 MED ORDER — SIMVASTATIN 20 MG PO TABS: 20.0000 mg | ORAL_TABLET | Freq: Every evening | ORAL | Status: DC

## 2011-04-27 MED ORDER — HYDROCHLOROTHIAZIDE 25 MG PO TABS: 25.0000 mg | ORAL_TABLET | Freq: Every day | ORAL | Status: DC

## 2011-04-27 MED ORDER — CLONIDINE HCL 0.2 MG PO TABS: 0.2000 mg | ORAL_TABLET | Freq: Two times a day (BID) | ORAL | Status: DC

## 2011-04-27 MED ORDER — OXYBUTYNIN CHLORIDE ER 10 MG PO TB24: 10.0000 mg | ORAL_TABLET | Freq: Every day | ORAL | Status: DC

## 2011-04-27 NOTE — Patient Instructions (Signed)
Continue your current medications.  I would strongly recommend the shingles.  Vaccine.  Return in one year, sooner if any problem

## 2011-04-27 NOTE — Progress Notes (Signed)
  Subjective:    Patient ID: Thomas [name], male    DOB: [birth year], 77 y.o.   MRN: [id number]  HPI Kyser is a P. 77-year-old male, who comes in today for a Medicare wellness examination.  He is married and a nonsmoker.  He takes Catapres, @BARCODE2D (Error - No data available.)@ b.i.d., Lipitor, thiazide 25 mg daily, verapamil, 240 mg daily for hypertension.  BP 140/90.  He also takes to trip and 10 mg daily because of a history of prostate cancer and outlet obstruction.  He gets routine eye care, hearing normal, regular dental care, colonoscopy, normal, tetanus, 2010, Pneumovax, x 2, information given on shingles, flu shot today.  Cognitive function, normal, he walks on a daily basis, home health safety reviewed.  No issues identified.  No guns in the house.  He does have a healthcare power of attorney and living will.   Review of Systems  Constitutional: Negative.   HENT: Negative.   Eyes: Negative.   Respiratory: Negative.   Cardiovascular: Negative.   Gastrointestinal: Negative.   Genitourinary: Negative.   Musculoskeletal: Negative.   Skin: Negative.   Neurological: Negative.   Hematological: Negative.   Psychiatric/Behavioral: Negative.        Objective:   Physical Exam  Constitutional: He is oriented to person, place, and time. He appears well-developed and well-nourished.  HENT:  Head: Normocephalic and atraumatic.  Right Ear: External ear normal.  Left Ear: External ear normal.  Nose: Nose normal.  Mouth/Throat: Oropharynx is clear and moist.  Eyes: Conjunctivae and EOM are normal. Pupils are equal, round, and reactive to light.  Neck: Normal range of motion. Neck supple. No JVD present. No tracheal deviation present. No thyromegaly present.  Cardiovascular: Normal rate, regular rhythm, normal heart sounds and intact distal pulses.  Exam reveals no gallop and no friction rub.   No murmur heard. Pulmonary/Chest: Effort normal and breath sounds normal. No  stridor. No respiratory distress. He has no wheezes. He has no rales. He exhibits no tenderness.  Abdominal: Soft. Bowel sounds are normal. He exhibits no distension and no mass. There is no tenderness. There is no rebound and no guarding.  Genitourinary: Rectum normal and penis normal. Guaiac negative stool. No penile tenderness.       Prostate surgically removed  Musculoskeletal: Normal range of motion. He exhibits no edema and no tenderness.  Lymphadenopathy:    He has no cervical adenopathy.  Neurological: He is alert and oriented to person, place, and time. He has normal reflexes. No cranial nerve deficit. He exhibits normal muscle tone.  Skin: Skin is warm and dry. No rash noted. No erythema. No pallor.  Psychiatric: He has a normal mood and affect. His behavior is normal. Judgment and thought content normal.          Assessment & Plan:  Healthy male.  Hypertension.  Continue current medications.  Hyperlipidemia.  Continue simvastatin 20 mg daily with aspirin.  Check labs today.  History prostate cancer.  History of outlet obstruction.  Continue Ditropan 10 mg daily.  Flu shot today and recommended shingles.  Vaccine

## 2011-05-10 ENCOUNTER — Emergency Department (HOSPITAL_COMMUNITY)
Admission: EM | Admit: 2011-05-10 | Discharge: 2011-05-10 | Disposition: A | Discharge status: Home / Self Care | Payer: Medicare Other | Attending: Emergency Medicine | Admitting: Emergency Medicine

## 2011-05-10 DIAGNOSIS — IMO0002 Reserved for concepts with insufficient information to code with codable children: Secondary | ICD-10-CM | POA: Insufficient documentation

## 2011-05-10 DIAGNOSIS — K219 Gastro-esophageal reflux disease without esophagitis: Secondary | ICD-10-CM | POA: Insufficient documentation

## 2011-05-10 DIAGNOSIS — R059 Cough, unspecified: Secondary | ICD-10-CM | POA: Insufficient documentation

## 2011-05-10 DIAGNOSIS — T18108A Unspecified foreign body in esophagus causing other injury, initial encounter: Secondary | ICD-10-CM | POA: Insufficient documentation

## 2011-05-10 DIAGNOSIS — I10 Essential (primary) hypertension: Secondary | ICD-10-CM | POA: Insufficient documentation

## 2011-05-10 DIAGNOSIS — C61 Malignant neoplasm of prostate: Secondary | ICD-10-CM | POA: Insufficient documentation

## 2011-05-10 DIAGNOSIS — R05 Cough: Secondary | ICD-10-CM | POA: Insufficient documentation

## 2011-05-10 DIAGNOSIS — E785 Hyperlipidemia, unspecified: Secondary | ICD-10-CM | POA: Insufficient documentation

## 2011-05-11 ENCOUNTER — Telehealth: Payer: Self-pay | Admitting: Gastroenterology

## 2011-05-11 NOTE — Telephone Encounter (Signed)
Pt went to the ER last pm for a meat impaction and after a 3 hour wait, he was able to drink water and eat crackers w/o any intervention per notes and his daughter. Hx of GI Bleed from Diverticulosis, COLON with adenomatous polyp removal, Food impactions requiring removal and EGD with dilations in 2009, 08/29/10 and 09/12/10 and Chronic Active H. Pylori Gastritis in 2009 and 09/12/10. Per Dr Jarold Motto, OK to do Direct EGD on 05/17/11. Daughter, Jasmine December informed and instructions mailed to pt's home with Prep packet.

## 2011-05-17 ENCOUNTER — Other Ambulatory Visit: Payer: Self-pay | Admitting: Gastroenterology

## 2011-05-17 ENCOUNTER — Encounter: Payer: Self-pay | Admitting: Gastroenterology

## 2011-05-17 ENCOUNTER — Ambulatory Visit (AMBULATORY_SURGERY_CENTER): Payer: Medicare Other | Admitting: Gastroenterology

## 2011-05-17 VITALS — BP 109/69 | HR 55 | Temp 97.8°F | Resp 20 | Ht 68.0 in | Wt 190.0 lb

## 2011-05-17 DIAGNOSIS — K219 Gastro-esophageal reflux disease without esophagitis: Secondary | ICD-10-CM | POA: Insufficient documentation

## 2011-05-17 DIAGNOSIS — K222 Esophageal obstruction: Secondary | ICD-10-CM

## 2011-05-17 DIAGNOSIS — R1310 Dysphagia, unspecified: Secondary | ICD-10-CM | POA: Insufficient documentation

## 2011-05-17 MED ORDER — SODIUM CHLORIDE 0.9 % IV SOLN: 500.0000 mL | INTRAVENOUS | Status: DC

## 2011-05-17 MED ORDER — OMEPRAZOLE 40 MG PO CPDR: 40.0000 mg | DELAYED_RELEASE_CAPSULE | Freq: Every day | ORAL | Status: DC

## 2011-05-17 NOTE — Patient Instructions (Signed)
Please review discharge instructions (blue and green sheets)  Follow dilation diet today- see handout  Dr. Norval Gable nurse will call in your anti-reflux medication- take 1 tablet 30 minutes before breakfast daily

## 2011-05-17 NOTE — Progress Notes (Signed)
Dr Jarold Motto asked that patient be given omeprazole 40 mg once daily x 11 refills. Rx has been sent.

## 2011-05-18 ENCOUNTER — Telehealth: Payer: Self-pay | Admitting: *Deleted

## 2011-05-18 NOTE — Telephone Encounter (Signed)
Follow up Call- Patient questions:  Do you have a fever, pain , or abdominal swelling? no Pain Score  0 *  Have you tolerated food without any problems? yes  Have you been able to return to your normal activities? yes  Do you have any questions about your discharge instructions: Diet   no Medications  no Follow up visit  no  Do you have questions or concerns about your Care? yes Pt states that he did great after his procedure. Pt states that his lips are moderately swollen. Educated pt to apply ice to lips intermittently to help with swelling. Advised pt to call back if swelling worsens or doesn't seem to get better or if he has any other problems.  Actions: * If pain score is 4 or above: No action needed, pain <4.

## 2011-06-02 ENCOUNTER — Telehealth: Payer: Self-pay | Admitting: *Deleted

## 2011-06-02 DIAGNOSIS — F0390 Unspecified dementia without behavioral disturbance: Secondary | ICD-10-CM

## 2011-06-02 NOTE — Telephone Encounter (Signed)
[name] is calling to see if her father was checked for dementia at his last CPX, and if not, how can they do this.  He seems to be developing symptoms.

## 2011-06-05 ENCOUNTER — Encounter: Payer: Self-pay | Admitting: Neurology

## 2011-06-05 DIAGNOSIS — F0390 Unspecified dementia without behavioral disturbance: Secondary | ICD-10-CM | POA: Insufficient documentation

## 2011-06-05 NOTE — Telephone Encounter (Signed)
Set up neurologic consult with Dr. Denton Meek

## 2011-06-05 NOTE — Telephone Encounter (Signed)
Daughter notified and referral request sent

## 2011-06-21 ENCOUNTER — Ambulatory Visit: Payer: Medicare Other | Admitting: Neurology

## 2011-09-06 ENCOUNTER — Ambulatory Visit (INDEPENDENT_AMBULATORY_CARE_PROVIDER_SITE_OTHER): Payer: Medicare Other | Admitting: Family

## 2011-09-06 ENCOUNTER — Encounter: Payer: Self-pay | Admitting: Family

## 2011-09-06 VITALS — BP 120/80 | Temp 97.7°F | Wt 194.0 lb

## 2011-09-06 DIAGNOSIS — M1990 Unspecified osteoarthritis, unspecified site: Secondary | ICD-10-CM

## 2011-09-06 DIAGNOSIS — M25529 Pain in unspecified elbow: Secondary | ICD-10-CM

## 2011-09-06 MED ORDER — MELOXICAM 15 MG PO TABS: 15.0000 mg | ORAL_TABLET | Freq: Every day | ORAL | Status: DC

## 2011-09-06 NOTE — Progress Notes (Signed)
Subjective:    Patient ID: Thomas [name], male    DOB: 1925/10/15, 78 y.o.   MRN: [id number]  HPI  78 year old [place] American male, patient of Dr. Tawanna Cooler, nonsmoker is in today with complaint of bilateral elbow pain. The left elbow is more tender and painful than the right. He's taken Tylenol and aspirin that has helped in the past. Recent pain that he 9/10, worse with movement. Describes it as constant and achy.  Denies any numbness or tingling.  Review of Systems  Constitutional: Negative.   Respiratory: Negative.   Cardiovascular: Negative.   Gastrointestinal: Negative.   Musculoskeletal:       Left elbow pain  Skin: Negative.   Neurological: Negative.   Hematological: Negative.   Psychiatric/Behavioral: Negative.    Past Medical History  Diagnosis Date  . Hypertension   . Allergy   . ED (erectile dysfunction)   . Cancer     prostate    History   Social History  . Marital Status: Married    Spouse Name: N/A    Number of Children: N/A  . Years of Education: N/A   Occupational History  . Not on file.   Social History Main Topics  . Smoking status: Former Games developer  . Smokeless tobacco: Not on file  . Alcohol Use: No  . Drug Use: No  . Sexually Active:    Other Topics Concern  . Not on file   Social History Narrative  . No narrative on file    No past surgical history on file.  Family History  Problem Relation Age of Onset  . Heart disease Father     Allergies  Allergen Reactions  . Ace Inhibitors     REACTION: Hives  . Penicillins     REACTION: Swelling-face    Current Outpatient Prescriptions on File Prior to Visit  Medication Sig Dispense Refill  . aspirin 325 MG tablet Take 81 mg by mouth daily.       . cloNIDine (CATAPRES) 0.2 MG tablet Take 1 tablet (0.2 mg total) by mouth 2 (two) times daily.  200 tablet  3  . hydrochlorothiazide 25 MG tablet Take 1 tablet (25 mg total) by mouth daily.  100 tablet  3  . ibuprofen (ADVIL,MOTRIN) 200 MG  tablet Take 200 mg by mouth every 6 (six) hours as needed.        Marland Kitchen omeprazole (PRILOSEC) 40 MG capsule Take 1 capsule (40 mg total) by mouth daily.  30 capsule  11  . oxybutynin (DITROPAN-XL) 10 MG 24 hr tablet Take 1 tablet (10 mg total) by mouth daily.  100 tablet  3  . predniSONE (DELTASONE) 20 MG tablet Two tabs x 3 days, one tab x 3 days, one half a tablet x 3 days, then half a tablet Monday, Wednesday, Friday, for a two week taper  40 tablet  1  . simvastatin (ZOCOR) 20 MG tablet Take 1 tablet (20 mg total) by mouth every evening.  100 tablet  3  . verapamil (CALAN-SR) 240 MG CR tablet Take 1 tablet (240 mg total) by mouth at bedtime.  100 tablet  3    BP 120/80  Temp(Src) 97.7 F (36.5 C) (Oral)  Wt 194 lb (87.998 kg)chart    Objective:   Physical Exam  Constitutional: He is oriented to person, place, and time. He appears well-developed and well-nourished.  Neck: Normal range of motion. Neck supple.  Cardiovascular: Normal rate, regular rhythm and normal heart sounds.   Pulmonary/Chest:  Effort normal and breath sounds normal.  Abdominal: Soft. Bowel sounds are normal.  Musculoskeletal: Normal range of motion.  Neurological: He is alert and oriented to person, place, and time.  Skin: Skin is warm and dry.  Psychiatric: He has a normal mood and affect.          Assessment & Plan:  Assessment: Left elbow pain, likely osteoarthritis  Plan: Mobic 15 mg with food by mouth daily x10-14 days. Osteoarthritis pamphlet given. Patient to call the office is symptoms worsen or persist. Recheck as scheduled when necessary.

## 2011-09-06 NOTE — Patient Instructions (Signed)
Osteoarthritis Osteoarthritis is the most common form of arthritis. It is redness, soreness, and swelling (inflammation) affecting the cartilage. Cartilage acts as a cushion, covering the ends of bones where they meet to form a joint. CAUSES  Over time, the cartilage begins to wear away. This causes bone to rub on bone. This produces pain and stiffness in the affected joints. Factors that contribute to this problem are:  Excessive body weight.   Age.   Overuse of joints.  SYMPTOMS   People with osteoarthritis usually experience joint pain, swelling, or stiffness.   Over time, the joint may lose its normal shape.   Small deposits of bone (osteophytes) may grow on the edges of the joint.   Bits of bone or cartilage can break off and float inside the joint space. This may cause more pain and damage.   Osteoarthritis can lead to depression, anxiety, feelings of helplessness, and limitations on daily activities.  The most commonly affected joints are in the:  Ends of the fingers.   Thumbs.   Neck.   Lower back.   Knees.   Hips.  DIAGNOSIS  Diagnosis is mostly based on your symptoms and exam. Tests may be helpful, including:  X-rays of the affected joint.   A computerized magnetic scan (MRI).   Blood tests to rule out other types of arthritis.   Joint fluid tests. This involves using a needle to draw fluid from the joint and examining the fluid under a microscope.  TREATMENT  Goals of treatment are to control pain, improve joint function, maintain a normal body weight, and maintain a healthy lifestyle. Treatment approaches may include:  A prescribed exercise program with rest and joint relief.   Weight control with nutritional education.   Pain relief techniques such as:   Properly applied heat and cold.   Electric pulses delivered to nerve endings under the skin (transcutaneous electrical nerve stimulation, TENS).   Massage.   Certain supplements. Ask your  caregiver before using any supplements, especially in combination with prescribed drugs.   Medicines to control pain, such as:   Acetaminophen.   Nonsteroidal anti-inflammatory drugs (NSAIDs), such as naproxen.   Narcotic or central-acting agents, such as tramadol. This drug carries a risk of addiction and is generally prescribed for short-term use.   Corticosteroids. These can be given orally or as injection. This is a short-term treatment, not recommended for routine use.   Surgery to reposition the bones and relieve pain (osteotomy) or to remove loose pieces of bone and cartilage. Joint replacement may be needed in advanced states of osteoarthritis.  HOME CARE INSTRUCTIONS  Your caregiver can recommend specific types of exercise. These may include:  Strengthening exercises. These are done to strengthen the muscles that support joints affected by arthritis. They can be performed with weights or with exercise bands to add resistance.   Aerobic activities. These are exercises, such as brisk walking or low-impact aerobics, that get your heart pumping. They can help keep your lungs and circulatory system in shape.   Range-of-motion activities. These keep your joints limber.   Balance and agility exercises. These help you maintain daily living skills.  Learning about your condition and being actively involved in your care will help improve the course of your osteoarthritis. SEEK MEDICAL CARE IF:   You feel hot or your skin turns red.   You develop a rash in addition to your joint pain.   You have an oral temperature above 102 F (38.9 C).  FOR   MORE INFORMATION  National Institute of Arthritis and Musculoskeletal and Skin Diseases: www.niams.nih.gov National Institute on Aging: www.nia.nih.gov American College of Rheumatology: www.rheumatology.org Document Released: 07/31/2005 Document Revised: 04/12/2011 Document Reviewed: 11/11/2009 [employer] Patient Information 2012 [employer]. 

## 2011-10-12 ENCOUNTER — Encounter: Payer: Self-pay | Admitting: Neurology

## 2011-10-12 ENCOUNTER — Ambulatory Visit (INDEPENDENT_AMBULATORY_CARE_PROVIDER_SITE_OTHER): Payer: Medicare Other | Admitting: Neurology

## 2011-10-12 ENCOUNTER — Other Ambulatory Visit (INDEPENDENT_AMBULATORY_CARE_PROVIDER_SITE_OTHER): Payer: Medicare Other

## 2011-10-12 ENCOUNTER — Other Ambulatory Visit: Payer: Self-pay | Admitting: Neurology

## 2011-10-12 DIAGNOSIS — G609 Hereditary and idiopathic neuropathy, unspecified: Secondary | ICD-10-CM

## 2011-10-12 DIAGNOSIS — R7309 Other abnormal glucose: Secondary | ICD-10-CM

## 2011-10-12 LAB — COMPREHENSIVE METABOLIC PANEL
Albumin: 3.9 g/dL (ref 3.5–5.2)
CO2: 26 mEq/L (ref 19–32)
Calcium: 9.2 mg/dL (ref 8.4–10.5)
GFR: 66.46 mL/min (ref >60.00)
Glucose, Bld: 99 mg/dL (ref 70–99)
Potassium: 3.5 mEq/L (ref 3.5–5.1)
Sodium: 138 mEq/L (ref 135–145)
Total Protein: 7.3 g/dL (ref 6.0–8.3)

## 2011-10-12 LAB — CBC WITH DIFFERENTIAL/PLATELET
Basophils Relative: 0.4 % (ref 0.0–3.0)
Eosinophils Relative: 2.1 % (ref 0.0–5.0)
HCT: 42.6 % (ref 39.0–52.0)
Lymphs Abs: 1.4 10*3/uL (ref 0.7–4.0)
Monocytes Relative: 14 % — ABNORMAL HIGH (ref 3.0–12.0)
Neutrophils Relative %: 65.7 % (ref 43.0–77.0)
Platelets: 214 10*3/uL (ref 150.0–400.0)
RBC: 4.46 Mil/uL (ref 4.22–5.81)
WBC: 8.1 10*3/uL (ref 4.5–10.5)

## 2011-10-12 LAB — HEMOGLOBIN A1C: Hgb A1c MFr Bld: 6.7 % — ABNORMAL HIGH (ref 4.6–6.5)

## 2011-10-12 NOTE — Patient Instructions (Signed)
Go to the basement to have your labs drawn today.  . 

## 2011-10-12 NOTE — Progress Notes (Signed)
Dear Dr. Tawanna Cooler,  Thank you for having me see Thomas [name] in consultation today at [facility] for his problem with memory loss.  As you may recall, he is a 78 y.o. year old male with a history of hypertension and prostate cancer.  His wife and daughter accompany him.  He has not noticed a change in cognition.  His daughter and wife say that he is repeating questions and sometimes mentioning the same point during a conversation.  He also seems to be more argumentative at times.  They deny problems with him getting lost, having problems driving.  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  He reads the paper daily and can talk about the current events.  They are concerned about dementia.  He denies hallucinations and has had no bladder incontinence or changes in gait.   He denies depression although his wife thinks he may worry about his prostate cancer.  He lost his son in an aircraft accident in 1992 and he says this weighs on him still but his mood has not changed.  Past Medical History  Diagnosis Date  . Hypertension   . Allergy   . ED (erectile dysfunction)   . Cancer     prostate  - no history of head trauma, stroke or seizure  Past Surgical History  Procedure Date  . Prostate surgery   . Dilation of the esophagus     History   Social History  . Marital Status: Married    Spouse Name: N/A    Number of Children: N/A  . Years of Education: N/A   Social History Main Topics  . Smoking status: Former Games developer  . Smokeless tobacco: Never Used  . Alcohol Use: No     daily--one alcohol drink a day  . Drug Use: No  . Sexually Active: None   Other Topics Concern  . None   Social History Narrative  . None   - while he only admits to one drink a day, his family says that he has more.  Family History  Problem Relation Age of Onset  . Heart disease Father   - no history of dementia.  Current Outpatient  Prescriptions on File Prior to Visit  Medication Sig Dispense Refill  . aspirin 325 MG tablet Take 81 mg by mouth daily.       . cloNIDine (CATAPRES) 0.2 MG tablet Take 1 tablet (0.2 mg total) by mouth 2 (two) times daily.  200 tablet  3  . hydrochlorothiazide 25 MG tablet Take 1 tablet (25 mg total) by mouth daily.  100 tablet  3  . ibuprofen (ADVIL,MOTRIN) 200 MG tablet Take 200 mg by mouth every 6 (six) hours as needed.        Marland Kitchen omeprazole (PRILOSEC) 40 MG capsule Take 1 capsule (40 mg total) by mouth daily.  30 capsule  11  . oxybutynin (DITROPAN-XL) 10 MG 24 hr tablet Take 1 tablet (10 mg total) by mouth daily.  100 tablet  3  . simvastatin (ZOCOR) 20 MG tablet Take 1 tablet (20 mg total) by mouth every evening.  100 tablet  3  . verapamil (CALAN-SR) 240 MG CR tablet Take 1 tablet (240 mg total) by mouth at bedtime.  100 tablet  3  . meloxicam (MOBIC) 15 MG tablet Take 1 tablet (15 mg total) by mouth daily.  30 tablet  0  . predniSONE (DELTASONE)  20 MG tablet Two tabs x 3 days, one tab x 3 days, one half a tablet x 3 days, then half a tablet Monday, Wednesday, Friday, for a two week taper  40 tablet  1    Allergies  Allergen Reactions  . Ace Inhibitors     REACTION: Hives  . Penicillins     REACTION: Swelling-face      ROS:  13 systems were reviewed and are notable for no gait changes or bladder or bowel incontinence. All other review of systems are unremarkable.   Examination:  Filed Vitals:   10/12/11 1428  BP: 140/76  Pulse: 60  Weight: 192 lb (87.091 kg)     In general, well appearing older man.  FRS: - snout, - PM, - glabellar  Cardiovascular: The patient has a regular rate and rhythm and no carotid bruits.  Fundoscopy:  Disks are flat. Vessel caliber within normal limits.  Mental status:   MMSE 25/27, one point lost for counting backwards by 2 from 20 -- patient has a difficult time spelling.  Cranial Nerves: Pupils are equally round and reactive to light.  Visual fields full to confrontation. Extraocular movements are intact without nystagmus. Facial sensation and muscles of mastication are intact. Muscles of facial expression are symmetric. Hearing intact to bilateral finger rub. Tongue protrusion, uvula, palate midline.  Shoulder shrug intact  Motor:  The patient has normal bulk and tone, no pronator drift.  There are no adventitious movements.  5/5 muscle strength bilaterally.  Reflexes:   Biceps  Triceps Brachioradialis Knee Ankle  Right 2+  2+  2+   2+ o  Left  2+  2+  2+   2+ o  Toes down  Coordination:  Normal finger to nose.  No dysdiadokinesia.  Sensation is lost to temp and vibration in length dep manner.  Normal position.  Gait and Station are normal. Romberg negative.   Impression/Recs: 1.  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  While I offered NP testing and MRI I don't think this is essential at this time and the patient would like to wait and see.  The family is ok with this as well.  If he gets worse I would pursue further workup. 2.  ?Peripheral neuropathy - I am have sent off PN labs, including B12 which was not checked for his memory.  He is asymptomatic so I just want to rule out reversible causes   We will see the patient back on as needed basis.  Thank you for having [place] see Thomas [name] in consultation.  Feel free to contact me with any questions.  Lupita Raider Modesto Charon, MD [facility], [place] [address] [place], [place] [address] Phone: [phone] Fax: [phone].

## 2011-10-17 LAB — SPEP & IFE WITH QIG
Alpha-1-Globulin: 6.6 % — ABNORMAL HIGH (ref 2.9–4.9)
Alpha-2-Globulin: 14.4 % — ABNORMAL HIGH (ref 7.1–11.8)
Gamma Globulin: 14.2 % (ref 11.1–18.8)
IgM, Serum: 72 mg/dL (ref 41–251)
Total Protein, Serum Electrophoresis: 7 g/dL (ref 6.0–8.3)

## 2011-10-23 ENCOUNTER — Telehealth: Payer: Self-pay | Admitting: Neurology

## 2011-10-23 NOTE — Telephone Encounter (Signed)
Message copied by Benay Spice on Mon Oct 23, 2011  9:40 AM ------      Message from: Milas Gain      Created: Sun Oct 22, 2011  6:17 PM       Let Mr. Housel or his family know that his HbA1C was slightly high.  This indicates that is glucose has been on the high side.  Not sure if this represents diabetes but he should follow up with Dr. Tawanna Cooler about this.

## 2011-10-23 NOTE — Telephone Encounter (Signed)
Called and spoke with the patient's wife. Information given as per Dr. Modesto Charon re: lab results. I let her know that patient's sugar was a little high and that they should follow up with Dr. Tawanna Cooler about that. She states they will. No issues or concerns voiced at this time.

## 2012-04-25 ENCOUNTER — Encounter: Payer: Self-pay | Admitting: Family Medicine

## 2012-04-25 ENCOUNTER — Ambulatory Visit (INDEPENDENT_AMBULATORY_CARE_PROVIDER_SITE_OTHER): Payer: Medicare Other | Admitting: Family Medicine

## 2012-04-25 VITALS — BP 90/64

## 2012-04-25 DIAGNOSIS — Z23 Encounter for immunization: Secondary | ICD-10-CM

## 2012-04-25 DIAGNOSIS — I959 Hypotension, unspecified: Secondary | ICD-10-CM

## 2012-04-25 DIAGNOSIS — I10 Essential (primary) hypertension: Secondary | ICD-10-CM

## 2012-04-25 DIAGNOSIS — R55 Syncope and collapse: Secondary | ICD-10-CM | POA: Insufficient documentation

## 2012-04-25 NOTE — Progress Notes (Signed)
  Subjective:    Patient ID: Thomas [name], male    DOB: 03-May-1926, 78 y.o.   MRN: [id number]  HPI Thomas [name] is an 78 year old male who comes in today accompanied by his wife for evaluation of her blood pressure. In the exam room he stood up he got lightheaded Thomas [name] fortunately caught him before he hit the table. He was never completely unconscious. He was taken to the treatment room BP was not audible palpable ninety over zero. He states he did not take his blood pressure medication this morning. When asked he says he's taking the verapamil in the morning and at bedtime. He is only supposed be taking 1 tablet a day at bedtime   Review of Systems General and cardiovascular review of systems otherwise negative    Objective:   Physical Exam Well-developed well-nourished male in no acute distress HEENT negative cardiopulmonary exam negative EKG unchanged       Assessment & Plan:  Hypotension probably secondary to double in his medication inadvertently plan hold blood pressure medication today restart on Saturday

## 2012-04-25 NOTE — Patient Instructions (Signed)
Rest at home  Hold it your blood pressure medication today and tomorrow  Restart your blood pressure medication on Friday,,,,,,,, Catapres one in the morning and one at bedtime, hydrochlorothiazide one in the morning, verapamil 1 at bedtime  Return on Monday for followup  Check your blood pressure 3 times daily

## 2012-04-26 ENCOUNTER — Telehealth: Payer: Self-pay | Admitting: Family Medicine

## 2012-04-26 DIAGNOSIS — I10 Essential (primary) hypertension: Secondary | ICD-10-CM

## 2012-04-26 DIAGNOSIS — E785 Hyperlipidemia, unspecified: Secondary | ICD-10-CM

## 2012-04-26 DIAGNOSIS — Z8546 Personal history of malignant neoplasm of prostate: Secondary | ICD-10-CM

## 2012-04-26 MED ORDER — HYDROCHLOROTHIAZIDE 25 MG PO TABS: 25.0000 mg | ORAL_TABLET | Freq: Every day | ORAL | Status: DC

## 2012-04-26 NOTE — Telephone Encounter (Signed)
Pts daughter called and said that her dad needs refill of hydrochlorothiazide 25 MG tablet to [facility] on [address]. Pls call in today.

## 2012-04-29 ENCOUNTER — Encounter: Payer: Self-pay | Admitting: Family Medicine

## 2012-04-29 ENCOUNTER — Ambulatory Visit (INDEPENDENT_AMBULATORY_CARE_PROVIDER_SITE_OTHER): Payer: Medicare Other | Admitting: Family Medicine

## 2012-04-29 ENCOUNTER — Ambulatory Visit: Payer: Medicare Other | Admitting: Family Medicine

## 2012-04-29 VITALS — BP 128/82 | Temp 98.3°F | Wt 178.0 lb

## 2012-04-29 DIAGNOSIS — Z23 Encounter for immunization: Secondary | ICD-10-CM

## 2012-04-29 DIAGNOSIS — R55 Syncope and collapse: Secondary | ICD-10-CM

## 2012-04-29 DIAGNOSIS — I10 Essential (primary) hypertension: Secondary | ICD-10-CM

## 2012-04-29 NOTE — Patient Instructions (Addendum)
Decrease the clonidine to only one tablet daily  Blood pressure check daily in the morning  Return in 3 weeks for followup with all your blood pressure reading

## 2012-04-29 NOTE — Progress Notes (Signed)
  Subjective:    Patient ID: Thomas [name], male    DOB: Sep 17, 1925, 78 y.o.   MRN: [id number]  HPI Thomas [name] is a 78 year old male who comes in today for followup of hypertension and a syncopal episode  When he was here last Thursday with his wife he stood up got lightheaded and passed out partially he was not unconscious he missed getting the exam table because [name] his head. We then placed in the bed rest at home and asked his family to recheck his medication.   Review of Systems General and cardiovascular review of systems otherwise negative no more episodes of syncope    Objective:   Physical Exam  Well-developed well-nourished male in no acute distress BP 120/82      Assessment & Plan:

## 2012-05-03 ENCOUNTER — Telehealth: Payer: Self-pay | Admitting: Family Medicine

## 2012-05-03 NOTE — Telephone Encounter (Signed)
Caller: Jasmine December [name]/Patient; Phone: [phone]; Reason for Call: Daughter states patient was seen on 9/16 and was given a medication list.  She has questions about what medications patient suppose to be taking.  Please call her back.  Thanks

## 2012-05-03 NOTE — Telephone Encounter (Signed)
Spoke with daughter

## 2012-05-10 ENCOUNTER — Other Ambulatory Visit (INDEPENDENT_AMBULATORY_CARE_PROVIDER_SITE_OTHER): Payer: Medicare Other

## 2012-05-10 DIAGNOSIS — Z79899 Other long term (current) drug therapy: Secondary | ICD-10-CM

## 2012-05-10 DIAGNOSIS — Z0000 Encounter for general adult medical examination without abnormal findings: Secondary | ICD-10-CM

## 2012-05-10 DIAGNOSIS — Z125 Encounter for screening for malignant neoplasm of prostate: Secondary | ICD-10-CM

## 2012-05-10 LAB — HEPATIC FUNCTION PANEL
ALT: 13 U/L (ref 0–53)
AST: 13 U/L (ref 0–37)
Albumin: 3.5 g/dL (ref 3.5–5.2)
Alkaline Phosphatase: 44 U/L (ref 39–117)
Total Protein: 6.4 g/dL (ref 6.0–8.3)

## 2012-05-10 LAB — CBC WITH DIFFERENTIAL/PLATELET
Basophils Relative: 0.5 % (ref 0.0–3.0)
HCT: 41.7 % (ref 39.0–52.0)
Hemoglobin: 13.8 g/dL (ref 13.0–17.0)
Lymphocytes Relative: 27.1 % (ref 12.0–46.0)
Lymphs Abs: 1.2 10*3/uL (ref 0.7–4.0)
Monocytes Relative: 13.3 % — ABNORMAL HIGH (ref 3.0–12.0)
Neutro Abs: 2.4 10*3/uL (ref 1.4–7.7)
RBC: 4.18 Mil/uL — ABNORMAL LOW (ref 4.22–5.81)
RDW: 14.4 % (ref 11.5–14.6)

## 2012-05-10 LAB — POCT URINALYSIS DIPSTICK
Blood, UA: NEGATIVE
Ketones, UA: NEGATIVE
Protein, UA: NEGATIVE
Spec Grav, UA: <=1.005
Urobilinogen, UA: 0.2
pH, UA: 5.5

## 2012-05-10 LAB — BASIC METABOLIC PANEL
CO2: 22 mEq/L (ref 19–32)
Calcium: 8.6 mg/dL (ref 8.4–10.5)
GFR: 55.97 mL/min — ABNORMAL LOW (ref >60.00)
Glucose, Bld: 121 mg/dL — ABNORMAL HIGH (ref 70–99)
Potassium: 3.8 mEq/L (ref 3.5–5.1)
Sodium: 136 mEq/L (ref 135–145)

## 2012-05-10 LAB — LDL CHOLESTEROL, DIRECT: Direct LDL: 175.1 mg/dL

## 2012-05-10 LAB — LIPID PANEL
Total CHOL/HDL Ratio: 6
VLDL: 28 mg/dL (ref 0.0–40.0)

## 2012-05-10 LAB — TSH: TSH: 1.23 u[IU]/mL (ref 0.35–5.50)

## 2012-05-16 ENCOUNTER — Encounter: Payer: Self-pay | Admitting: Family Medicine

## 2012-05-16 ENCOUNTER — Ambulatory Visit (INDEPENDENT_AMBULATORY_CARE_PROVIDER_SITE_OTHER): Payer: Medicare Other | Admitting: Family Medicine

## 2012-05-16 VITALS — BP 108/70 | Temp 97.5°F | Wt 186.0 lb

## 2012-05-16 DIAGNOSIS — Z8546 Personal history of malignant neoplasm of prostate: Secondary | ICD-10-CM

## 2012-05-16 DIAGNOSIS — K219 Gastro-esophageal reflux disease without esophagitis: Secondary | ICD-10-CM

## 2012-05-16 DIAGNOSIS — F0390 Unspecified dementia without behavioral disturbance: Secondary | ICD-10-CM

## 2012-05-16 DIAGNOSIS — I10 Essential (primary) hypertension: Secondary | ICD-10-CM

## 2012-05-16 DIAGNOSIS — Z0000 Encounter for general adult medical examination without abnormal findings: Secondary | ICD-10-CM

## 2012-05-16 NOTE — Patient Instructions (Signed)
Continue current medications  Followup in 1 year sooner if any problems 

## 2012-05-16 NOTE — Progress Notes (Signed)
  Subjective:    Patient ID: Thomas [name], male    DOB: 10-Jun-1926, 78 y.o.   MRN: [id number]  HPI Daron Offer is a 78 year old married male nonsmoker who comes in today for a Medicare wellness examination  He has underlying hypertension for which she takes Catapres 0.2 daily, hydrochlorothiazide 25 mg daily, verapamil 240 mg daily. BP this afternoon 108/70 he states he is not lightheaded when he stands up   he takes 200 mg of Motrin twice a day for osteoarthritis  He takes Prilosec 40 mg daily because of history of reflux esophagitis and an esophageal stricture.  He has issues with urinary frequency BPH status post treatment for prostate cancer and takes Ditropan 10 mg daily. He also takes an aspirin tablet daily.   Review of Systems  Constitutional: Negative.   HENT: Negative.   Eyes: Negative.   Respiratory: Negative.   Cardiovascular: Negative.   Gastrointestinal: Negative.   Genitourinary: Negative.   Musculoskeletal: Negative.   Skin: Negative.   Neurological: Negative.   Hematological: Negative.   Psychiatric/Behavioral: Negative.        Objective:   Physical Exam  Constitutional: He is oriented to person, place, and time. He appears well-developed and well-nourished.  HENT:  Head: Normocephalic and atraumatic.  Right Ear: External ear normal.  Left Ear: External ear normal.  Nose: Nose normal.  Mouth/Throat: Oropharynx is clear and moist.  Eyes: Conjunctivae normal and EOM are normal. Pupils are equal, round, and reactive to light.  Neck: Normal range of motion. Neck supple. No JVD present. No tracheal deviation present. No thyromegaly present.  Cardiovascular: Normal rate, regular rhythm, normal heart sounds and intact distal pulses.  Exam reveals no gallop and no friction rub.   No murmur heard. Pulmonary/Chest: Effort normal and breath sounds normal. No stridor. No respiratory distress. He has no wheezes. He has no rales. He exhibits no tenderness.  Abdominal: Soft.  Bowel sounds are normal. He exhibits no distension and no mass. There is no tenderness. There is no rebound and no guarding.  Genitourinary: Rectum normal.       Rectal exam done by urologist  Musculoskeletal: Normal range of motion. He exhibits no edema and no tenderness.  Lymphadenopathy:    He has no cervical adenopathy.  Neurological: He is alert and oriented to person, place, and time. He has normal reflexes. No cranial nerve deficit. He exhibits normal muscle tone.  Skin: Skin is warm and dry. No rash noted. No erythema. No pallor.  Psychiatric: He has a normal mood and affect. His behavior is normal. Judgment and thought content normal.          Assessment & Plan:  Healthy male  History of hypertension continue current medications  Reflux esophagitis with history of esophageal stricture continue Prilosec 40 mg daily  Urinary frequency with history of prostate cancer followed by urology continue Ditropan 10 mg daily  Mild dementia no therapy at this time

## 2012-07-05 ENCOUNTER — Other Ambulatory Visit: Payer: Self-pay | Admitting: Family Medicine

## 2012-07-05 ENCOUNTER — Other Ambulatory Visit: Payer: Self-pay | Admitting: Family

## 2012-07-05 ENCOUNTER — Encounter (INDEPENDENT_AMBULATORY_CARE_PROVIDER_SITE_OTHER): Payer: Medicare Other | Admitting: Ophthalmology

## 2012-07-05 DIAGNOSIS — H35039 Hypertensive retinopathy, unspecified eye: Secondary | ICD-10-CM

## 2012-07-05 DIAGNOSIS — H35379 Puckering of macula, unspecified eye: Secondary | ICD-10-CM

## 2012-07-05 DIAGNOSIS — I10 Essential (primary) hypertension: Secondary | ICD-10-CM

## 2012-07-05 DIAGNOSIS — H43819 Vitreous degeneration, unspecified eye: Secondary | ICD-10-CM

## 2012-07-05 DIAGNOSIS — H33309 Unspecified retinal break, unspecified eye: Secondary | ICD-10-CM

## 2012-07-08 ENCOUNTER — Other Ambulatory Visit: Payer: Self-pay | Admitting: Family Medicine

## 2012-07-09 ENCOUNTER — Encounter (INDEPENDENT_AMBULATORY_CARE_PROVIDER_SITE_OTHER): Payer: Medicare Other | Admitting: Ophthalmology

## 2012-07-09 DIAGNOSIS — H33309 Unspecified retinal break, unspecified eye: Secondary | ICD-10-CM

## 2012-07-15 ENCOUNTER — Encounter: Payer: Self-pay | Admitting: Family Medicine

## 2012-07-15 ENCOUNTER — Ambulatory Visit (INDEPENDENT_AMBULATORY_CARE_PROVIDER_SITE_OTHER): Payer: Medicare Other | Admitting: Family Medicine

## 2012-07-15 VITALS — BP 110/80 | Temp 97.9°F | Wt 186.0 lb

## 2012-07-15 DIAGNOSIS — L509 Urticaria, unspecified: Secondary | ICD-10-CM

## 2012-07-15 MED ORDER — PREDNISONE 20 MG PO TABS: ORAL_TABLET | ORAL | Status: DC

## 2012-07-15 NOTE — Progress Notes (Signed)
  Subjective:    Patient ID: Thomas [name], male    DOB: 07/26/1926, 78 y.o.   MRN: [id number]  HPI Daron Offer  is an 78 year old male who comes in today for evaluation of a skin rash  He states about a week ago he developed red raised swollen periodically since on his arms and legs. No shortness of breath. He does not recall having a problem like this in the past. He is taken no new medications and has not taken anything new dietary wise. He's not had a history of allergic reactions in the past   Review of Systems Review of systems otherwise negative    Objective:   Physical Exam Well-developed well-nourished male in no acute distress examination of the skin shows quarter-sized red raised lesions consistent with urticaria       Assessment & Plan:  Urticaria etiology unknown plan prednisone burst and taper

## 2012-07-15 NOTE — Patient Instructions (Signed)
Take the prednisone as directed return when necessary 

## 2012-07-31 ENCOUNTER — Ambulatory Visit (INDEPENDENT_AMBULATORY_CARE_PROVIDER_SITE_OTHER): Payer: Medicare Other | Admitting: Ophthalmology

## 2012-07-31 DIAGNOSIS — H33309 Unspecified retinal break, unspecified eye: Secondary | ICD-10-CM

## 2012-09-20 ENCOUNTER — Encounter: Payer: Self-pay | Admitting: Internal Medicine

## 2012-09-20 ENCOUNTER — Ambulatory Visit (INDEPENDENT_AMBULATORY_CARE_PROVIDER_SITE_OTHER): Payer: Medicare Other | Admitting: Internal Medicine

## 2012-09-20 ENCOUNTER — Ambulatory Visit (INDEPENDENT_AMBULATORY_CARE_PROVIDER_SITE_OTHER)
Admission: RE | Admit: 2012-09-20 | Discharge: 2012-09-20 | Disposition: A | Discharge status: Home / Self Care | Payer: Medicare Other | Source: Ambulatory Visit | Attending: Internal Medicine | Admitting: Internal Medicine

## 2012-09-20 VITALS — BP 116/80 | HR 61 | Temp 97.6°F | Wt 185.0 lb

## 2012-09-20 DIAGNOSIS — R059 Cough, unspecified: Secondary | ICD-10-CM

## 2012-09-20 DIAGNOSIS — J45909 Unspecified asthma, uncomplicated: Secondary | ICD-10-CM

## 2012-09-20 DIAGNOSIS — R05 Cough: Secondary | ICD-10-CM

## 2012-09-20 DIAGNOSIS — J069 Acute upper respiratory infection, unspecified: Secondary | ICD-10-CM

## 2012-09-20 MED ORDER — HYDROCODONE-HOMATROPINE 5-1.5 MG/5ML PO SYRP: 5.0000 mL | ORAL_SOLUTION | ORAL | Status: DC | PRN

## 2012-09-20 NOTE — Progress Notes (Signed)
Chief Complaint  Patient presents with  . Cough    Has been unable to sleep due to cough.    . Generalized Body Aches    HPI: Chief Complaint  Patient presents with  . Cough    Has been unable to sleep due to cough.    . Generalized Body Aches    HPI: Patient comes in today for SDA for  new problem evaluation. PCP NA today . Comes with son , Cough for 4 days and hard    To stop worse at night has body ache feeling and chills but no specific fever ,  otc no help .  ROS: See pertinent positives and negatives per HPI. No cp sob  Denies dx  Asthma or hemoptysis  Min phlegm . Had flu vaccine this year .   Past Medical History  Diagnosis Date  . Hypertension   . Allergy   . ED (erectile dysfunction)   . Cancer     prostate    Family History  Problem Relation Age of Onset  . Heart disease Father     History   Social History  . Marital Status: Married    Spouse Name: N/A    Number of Children: N/A  . Years of Education: N/A   Social History Main Topics  . Smoking status: Former Games developer  . Smokeless tobacco: Never Used  . Alcohol Use: No     Comment: daily--one alcohol drink a day  . Drug Use: No  . Sexually Active: None   Other Topics Concern  . None   Social History Narrative  . None    Outpatient Encounter Prescriptions as of 09/20/2012  Medication Sig Dispense Refill  . aspirin 325 MG tablet Take 81 mg by mouth daily.       . cloNIDine (CATAPRES) 0.2 MG tablet TAKE 1 TABLET (0.2 MG TOTAL) BY MOUTH 2 (TWO) TIMES DAILY.  200 tablet  4  . hydrochlorothiazide (HYDRODIURIL) 25 MG tablet Take 1 tablet (25 mg total) by mouth daily.  100 tablet  3  . ibuprofen (ADVIL,MOTRIN) 200 MG tablet Take 200 mg by mouth every 6 (six) hours as needed.        . meloxicam (MOBIC) 15 MG tablet TAKE 1 TABLET (15 MG TOTAL) BY MOUTH DAILY.  30 tablet  0  . omeprazole (PRILOSEC) 40 MG capsule Take 40 mg by mouth daily.      . predniSONE (DELTASONE) 20 MG tablet 2 tabs x3 days, 1 tab x3  days, a half a tab x3 days, then a half a tablet Monday Wednesday Friday for a 2 week taper  30 tablet  1  . verapamil (CALAN-SR) 240 MG CR tablet TAKE 1 TABLET (240 MG TOTAL) BY MOUTH AT BEDTIME.  100 tablet  3  . HYDROcodone-homatropine (HYCODAN) 5-1.5 MG/5ML syrup Take 5 mLs by mouth every 4 (four) hours as needed for cough.  180 mL  0   No facility-administered encounter medications on file as of 09/20/2012.    EXAM:  BP 116/80  Pulse 61  Temp(Src) 97.6 F (36.4 C) (Oral)  Wt 185 lb (83.915 kg)  BMI 28.14 kg/m2  SpO2 97%  Body mass index is 28.14 kg/(m^2).  GENERAL: vitals reviewed and listed above, alert, oriented, appears well hydrated and in no acute distress  HEENT: atraumatic, conjunctiva  clear, no obvious abnormalities on inspection of external nose and ears  Nose congested face non tender tms normal  OP : no lesion edema or  exudate  Dentition sub par    NECK: no obvious masses on inspection palpation  No adenopathy   LUNGS: clear to auscultation bilaterally, no wheezes, rales or rhonchi, ? Coarse bs bases    CV: HRRR, no clubbing cyanosis or  peripheral edema nl cap refill   MS: moves all extremities without noticeable focal  abnormality  PSYCH: pleasant and cooperative, no obvious depression or anxiety  ASSESSMENT AND PLAN:  Discussed the following assessment and plan:  1. Cough  DG Chest 2 View   DG Chest 2 View   flu like  sx  had vaccine  r/o pna   2. Upper respiratory infection with cough and congestion    3. ASTHMA, EXTRINSIC NOS     pt says doesnt really have this  but apparently has wheezed int past   disc alarm features   If x ray neg then rx as viral rti at risk follow closely as needed .   Expectant management.  -Patient advised to return or notify health care team  if symptoms worsen or persist or new concerns arise.  Patient Instructions  Get x ray as we discussed .    To make sure no pneumonia  If clear then can take cough med and rest  Cough  may last  10 - 14 days or so but if getting concerned contact us if high fever shortness of breath .    Cough, Adult  A cough is a reflex that helps clear your throat and airways. It can help heal the body or may be a reaction to an irritated airway. A cough may only last 2 or 3 weeks (acute) or may last more than 8 weeks (chronic).  CAUSES Acute cough:  Viral or bacterial infections. Chronic cough:  Infections.  Allergies.  Asthma.  Post-nasal drip.  Smoking.  Heartburn or acid reflux.  Some medicines.  Chronic lung problems (COPD).  Cancer. SYMPTOMS   Cough.  Fever.  Chest pain.  Increased breathing rate.  High-pitched whistling sound when breathing (wheezing).  Colored mucus that you cough up (sputum). TREATMENT   A bacterial cough may be treated with antibiotic medicine.  A viral cough must run its course and will not respond to antibiotics.  Your caregiver may recommend other treatments if you have a chronic cough. HOME CARE INSTRUCTIONS   Only take over-the-counter or prescription medicines for pain, discomfort, or fever as directed by your caregiver. Use cough suppressants only as directed by your caregiver.  Use a cold steam vaporizer or humidifier in your bedroom or home to help loosen secretions.  Sleep in a semi-upright position if your cough is worse at night.  Rest as needed.  Stop smoking if you smoke. SEEK IMMEDIATE MEDICAL CARE IF:   You have pus in your sputum.  Your cough starts to worsen.  You cannot control your cough with suppressants and are losing sleep.  You begin coughing up blood.  You have difficulty breathing.  You develop pain which is getting worse or is uncontrolled with medicine.  You have a fever. MAKE SURE YOU:   Understand these instructions.  Will watch your condition.  Will get help right away if you are not doing well or get worse. Document Released: 01/27/2011 Document Revised: 10/23/2011 Document  Reviewed: 01/27/2011 [facility] Patient Information 2013 [place], [place].      Neta Mends. Ranulfo Kall M.D.

## 2012-09-20 NOTE — Patient Instructions (Addendum)
Get x ray as we discussed .    To make sure no pneumonia  If clear then can take cough med and rest  Cough may last  10 - 14 days or so but if getting concerned contact us if high fever shortness of breath .    Cough, Adult  A cough is a reflex that helps clear your throat and airways. It can help heal the body or may be a reaction to an irritated airway. A cough may only last 2 or 3 weeks (acute) or may last more than 8 weeks (chronic).  CAUSES Acute cough:  Viral or bacterial infections. Chronic cough:  Infections.  Allergies.  Asthma.  Post-nasal drip.  Smoking.  Heartburn or acid reflux.  Some medicines.  Chronic lung problems (COPD).  Cancer. SYMPTOMS   Cough.  Fever.  Chest pain.  Increased breathing rate.  High-pitched whistling sound when breathing (wheezing).  Colored mucus that you cough up (sputum). TREATMENT   A bacterial cough may be treated with antibiotic medicine.  A viral cough must run its course and will not respond to antibiotics.  Your caregiver may recommend other treatments if you have a chronic cough. HOME CARE INSTRUCTIONS   Only take over-the-counter or prescription medicines for pain, discomfort, or fever as directed by your caregiver. Use cough suppressants only as directed by your caregiver.  Use a cold steam vaporizer or humidifier in your bedroom or home to help loosen secretions.  Sleep in a semi-upright position if your cough is worse at night.  Rest as needed.  Stop smoking if you smoke. SEEK IMMEDIATE MEDICAL CARE IF:   You have pus in your sputum.  Your cough starts to worsen.  You cannot control your cough with suppressants and are losing sleep.  You begin coughing up blood.  You have difficulty breathing.  You develop pain which is getting worse or is uncontrolled with medicine.  You have a fever. MAKE SURE YOU:   Understand these instructions.  Will watch your condition.  Will get help right away  if you are not doing well or get worse. Document Released: 01/27/2011 Document Revised: 10/23/2011 Document Reviewed: 01/27/2011 [facility] Patient Information 2013 [place], [place].

## 2012-11-19 ENCOUNTER — Encounter: Payer: Self-pay | Admitting: Family Medicine

## 2012-11-19 ENCOUNTER — Ambulatory Visit (INDEPENDENT_AMBULATORY_CARE_PROVIDER_SITE_OTHER): Payer: Medicare Other | Admitting: Family Medicine

## 2012-11-19 ENCOUNTER — Telehealth: Payer: Self-pay | Admitting: Family Medicine

## 2012-11-19 VITALS — BP 140/90 | Temp 98.7°F | Wt 186.0 lb

## 2012-11-19 DIAGNOSIS — I10 Essential (primary) hypertension: Secondary | ICD-10-CM

## 2012-11-19 LAB — BASIC METABOLIC PANEL
Calcium: 9.2 mg/dL (ref 8.4–10.5)
Creatinine, Ser: 1.1 mg/dL (ref 0.4–1.5)
GFR: 82.67 mL/min (ref >60.00)
Glucose, Bld: 97 mg/dL (ref 70–99)
Sodium: 137 mEq/L (ref 135–145)

## 2012-11-19 NOTE — Telephone Encounter (Signed)
Patient to stop HCTZ and continue potassium and follow up in 1 week.   Patient is aware and an appointment made.

## 2012-11-19 NOTE — Telephone Encounter (Signed)
Pt takes:  Potassium Chloride  1 tablespoon /daily

## 2012-11-19 NOTE — Patient Instructions (Signed)
Continue your current medications  We will call you and we get your lab work back

## 2012-11-19 NOTE — Progress Notes (Signed)
  Subjective:    Patient ID: Thomas [name], male    DOB: [birth year], 79 y.o.   MRN: [id number]  HPI Thomas [name] is an 79 year old male married nonsmoker who comes in today for followup of hypertension  His medications are unchanged except he went to the [place] and they gave him a potassium pill because his serum potassium was 3.0. He cannot swallow pills therefore they switched him to a liquid. He's taken a half a teaspoon daily but does not recall the dose  He feels well no complications BP today 140/90   Review of Systems    review of systems negative Objective:   Physical Exam  Well-developed and nourished male no acute distress BP right arm sitting position 140/90 pulse 70 and regular      Assessment & Plan:  Hypertension at goal continue current therapy  History of hypokalemia recheck potassium

## 2012-11-26 ENCOUNTER — Encounter: Payer: Self-pay | Admitting: Family Medicine

## 2012-11-26 ENCOUNTER — Ambulatory Visit (INDEPENDENT_AMBULATORY_CARE_PROVIDER_SITE_OTHER): Payer: Medicare Other | Admitting: Family Medicine

## 2012-11-26 VITALS — BP 108/80 | Temp 98.6°F | Wt 186.0 lb

## 2012-11-26 DIAGNOSIS — I10 Essential (primary) hypertension: Secondary | ICD-10-CM

## 2012-11-26 DIAGNOSIS — E876 Hypokalemia: Secondary | ICD-10-CM

## 2012-11-26 LAB — BASIC METABOLIC PANEL
Chloride: 101 mEq/L (ref 96–112)
Creatinine, Ser: 1.3 mg/dL (ref 0.4–1.5)
GFR: 69.93 mL/min (ref >60.00)

## 2012-11-26 NOTE — Patient Instructions (Signed)
Take all your medications in the morning,,,,,,,,,, 1 aspirin, one clonidine, 1 Prilosec, 1 verapamil, and one teaspoon of potassium  Labs today  I will call you the report in a couple days  Return in one month for followup sooner if any problems

## 2012-11-26 NOTE — Progress Notes (Signed)
  Subjective:    Patient ID: Thomas [name], male    DOB: 21-Sep-1925, 79 y.o.   MRN: [id number]  HPI Thomas [name] is a 79 year old male who comes in today for followup of hypertension and hypokalemia  We saw him a week ago and his serum potassium was low. We stopped the diuretic and gave him a potassium supplement 20 mEq per teaspoon daily. He comes back today for followup. His blood pressure is 108/80 and he states she's lightheaded when he stands up tonight   Review of Systems    review of systems negative Objective:   Physical Exam  Well-developed male no acute distress BP right arm sitting position 108/80      Assessment & Plan:  Hypertension,,,,,,,,,, BP too low decrease clonidine to 1 daily

## 2012-12-04 ENCOUNTER — Ambulatory Visit (INDEPENDENT_AMBULATORY_CARE_PROVIDER_SITE_OTHER): Payer: Medicare Other | Admitting: Ophthalmology

## 2012-12-26 ENCOUNTER — Ambulatory Visit (INDEPENDENT_AMBULATORY_CARE_PROVIDER_SITE_OTHER): Payer: Medicare Other | Admitting: Family Medicine

## 2012-12-26 ENCOUNTER — Encounter: Payer: Self-pay | Admitting: Family Medicine

## 2012-12-26 VITALS — BP 120/90 | Temp 98.2°F | Wt 190.0 lb

## 2012-12-26 DIAGNOSIS — Z8546 Personal history of malignant neoplasm of prostate: Secondary | ICD-10-CM

## 2012-12-26 NOTE — Progress Notes (Signed)
  Subjective:    Patient ID: Avon Mergenthaler, male    DOB: 1926-03-06, 79 y.o.   MRN: [id number]  HPI Mr. Cortopassi is an 79 year old male who comes in today for followup of hypertension and hypokalemia  He's taking clonidine 0.2 twice a day and verapamil 240 mg daily for hypertension BP 120/90. His blood pressure was elevated because he was not taking his medication on a daily basis. His wife is now monitoring his medications  He had hypokalemia. We gave him potassium supplement. Serum potassium back to normal 3.9   Review of Systems    review of systems negative Objective:   Physical Exam  Well-developed well-nourished male in no acute distress weight is steady 190 BP 120/90 right arm sitting position pulse 70 and regular      Assessment & Plan:  Hypertension at goal continue current therapy

## 2012-12-26 NOTE — Patient Instructions (Signed)
Continue one aspirin tablet daily, clonidine one twice daily, Prilosec 1 daily, verapamil 1 daily  Do not take any over-the-counter anti-inflammatories or Mobic  Check your blood pressure once weekly in the morning  Return when necessary

## 2013-02-15 ENCOUNTER — Encounter: Payer: Self-pay | Admitting: Family Medicine

## 2013-02-15 ENCOUNTER — Ambulatory Visit (INDEPENDENT_AMBULATORY_CARE_PROVIDER_SITE_OTHER): Payer: Medicare Other | Admitting: Family Medicine

## 2013-02-15 VITALS — BP 140/90 | HR 60 | Temp 97.1°F | Wt 185.0 lb

## 2013-02-15 DIAGNOSIS — R42 Dizziness and giddiness: Secondary | ICD-10-CM

## 2013-02-15 MED ORDER — ASPIRIN 325 MG PO TABS: ORAL_TABLET | ORAL | Status: DC

## 2013-02-15 NOTE — Patient Instructions (Addendum)

## 2013-02-15 NOTE — Progress Notes (Signed)
  Subjective:     Thomas [name] is a 79 y.o. male who presents for evaluation of dizziness. The symptoms started several days ago and are worse. The attacks occur frequently and last several  minutes. Positions that worsen symptoms: any motion. Previous workup/treatments: none. Associated ear symptoms: none. Associated CNS symptoms: none. Recent infections: none. Head trauma: denied. Drug ingestion: none. Noise exposure: no occupational exposure. Family history: non-contributory.  The following portions of the patient's history were reviewed and updated as appropriate: allergies, current medications, past family history, past medical history, past social history, past surgical history and problem list.  Review of Systems Pertinent items are noted in HPI.    Objective:    BP 140/90  Pulse 60  Temp(Src) 97.1 F (36.2 C) (Oral)  Wt 185 lb (83.915 kg)  BMI 28.14 kg/m2  SpO2 92% General appearance: alert, cooperative, appears stated age and no distress Head: Normocephalic, without obvious abnormality, atraumatic Ears: normal TM's and external ear canals both ears Nose: Nares normal. Septum midline. Mucosa normal. No drainage or sinus tenderness. Throat: lips, mucosa, and tongue normal; teeth and gums normal Neck: no adenopathy, supple, symmetrical, trachea midline and thyroid not enlarged, symmetric, no tenderness/mass/nodules Lungs: clear to auscultation bilaterally Heart: S1, S2 normal Extremities: extremities normal, atraumatic, no cyanosis or edema Neurologic: Alert and oriented X 3, normal strength and tone. Normal symmetric reflexes. Normal coordination and gait      Assessment:    Vertigo  ---  ? Near syncope with increase falls   Plan:    pt will f/u pcp for vertigo and frequent falling workup Pt  And family instructed to take him to ER if symptoms occur again

## 2013-02-17 ENCOUNTER — Ambulatory Visit (INDEPENDENT_AMBULATORY_CARE_PROVIDER_SITE_OTHER): Payer: Medicare Other | Admitting: Family Medicine

## 2013-02-17 ENCOUNTER — Encounter: Payer: Self-pay | Admitting: Family Medicine

## 2013-02-17 ENCOUNTER — Ambulatory Visit: Payer: Medicare Other | Admitting: Family Medicine

## 2013-02-17 VITALS — BP 130/80 | Temp 98.1°F | Wt 186.0 lb

## 2013-02-17 DIAGNOSIS — I10 Essential (primary) hypertension: Secondary | ICD-10-CM

## 2013-02-17 DIAGNOSIS — I679 Cerebrovascular disease, unspecified: Secondary | ICD-10-CM | POA: Insufficient documentation

## 2013-02-17 DIAGNOSIS — R55 Syncope and collapse: Secondary | ICD-10-CM

## 2013-02-17 DIAGNOSIS — G459 Transient cerebral ischemic attack, unspecified: Secondary | ICD-10-CM

## 2013-02-17 LAB — CBC WITH DIFFERENTIAL/PLATELET
Basophils Relative: 0.2 % (ref 0.0–3.0)
Eosinophils Relative: 1.8 % (ref 0.0–5.0)
Lymphocytes Relative: 18 % (ref 12.0–46.0)
MCV: 101.3 fl — ABNORMAL HIGH (ref 78.0–100.0)
Monocytes Absolute: 1 10*3/uL (ref 0.1–1.0)
Monocytes Relative: 17.1 % — ABNORMAL HIGH (ref 3.0–12.0)
Neutrophils Relative %: 62.9 % (ref 43.0–77.0)
RBC: 4.26 Mil/uL (ref 4.22–5.81)
WBC: 5.8 10*3/uL (ref 4.5–10.5)

## 2013-02-17 LAB — BASIC METABOLIC PANEL
BUN: 20 mg/dL (ref 6–23)
Chloride: 104 mEq/L (ref 96–112)
Glucose, Bld: 120 mg/dL — ABNORMAL HIGH (ref 70–99)
Potassium: 3.5 mEq/L (ref 3.5–5.1)

## 2013-02-17 LAB — TSH: TSH: 1.65 u[IU]/mL (ref 0.35–5.50)

## 2013-02-17 NOTE — Progress Notes (Signed)
  Subjective:    Patient ID: Thomas [name], male    DOB: 04/22/1926, 79 y.o.   MRN: [id number]  HPI Thomas [name] is a delightful 79 year old married male nonsmoker who comes in today accompanied by his wife and 2 sons for evaluation of 2 problems  He states that 2 weeks ago he was standing in the kitchen and had been upright for a while. He felt lightheaded and passed out and hurt his back. He did not go to the hospital. He woke up felt fine then 2 days ago on July 5 he was standing in the kitchen making coffee and he noticed a sudden onset of numbness in his right arm and right leg. He had no visual difficulty speech difficulty etc. He sat down about 15 minutes later the numbness went away.  He's had a history of syncope in the past and we've had to readjust his blood pressure medication. BP today 130/80  No chest pain  Neurologic review of systems otherwise negative   Review of Systems Cardiac review of systems negative also    Objective:   Physical Exam  Well-developed and nourished male no acute distress vital signs stable BP 130/80 pulse 70 and regular HEENT were negative the neck was supple no carotid bruits chest is clear to auscultation cardiac exam normal no murmurs or abnormalities of the rhythm.  Extremities normal skin normal peripheral pulses normal muscle strength reflexes gait all within normal limits      Assessment & Plan:  Syncopal episode decrease the verapamil to 120 mg daily decrease Catapres to 1 daily  Symptoms suggestive of TIA begin workup

## 2013-02-17 NOTE — Patient Instructions (Signed)
Rest at home  Decrease the Catapres only take one daily  Decrease the verapamil 120 mg daily  Check your blood pressure in the morning  Labs today  Carotid Doppler evaluation in cardiology this week also neurologic evaluation by a specialist  If you have another episode of numbness or fainting call 911 and come directly to come in hospital

## 2013-02-18 ENCOUNTER — Other Ambulatory Visit: Payer: Self-pay | Admitting: Family Medicine

## 2013-02-18 DIAGNOSIS — R55 Syncope and collapse: Secondary | ICD-10-CM

## 2013-02-21 ENCOUNTER — Encounter (INDEPENDENT_AMBULATORY_CARE_PROVIDER_SITE_OTHER): Payer: Medicare Other

## 2013-02-21 ENCOUNTER — Ambulatory Visit (INDEPENDENT_AMBULATORY_CARE_PROVIDER_SITE_OTHER): Payer: Medicare Other | Admitting: Vascular Surgery

## 2013-02-21 ENCOUNTER — Other Ambulatory Visit: Payer: Self-pay | Admitting: Family Medicine

## 2013-02-21 ENCOUNTER — Encounter: Payer: Self-pay | Admitting: Vascular Surgery

## 2013-02-21 DIAGNOSIS — I6529 Occlusion and stenosis of unspecified carotid artery: Secondary | ICD-10-CM | POA: Insufficient documentation

## 2013-02-21 DIAGNOSIS — I350 Nonrheumatic aortic (valve) stenosis: Secondary | ICD-10-CM

## 2013-02-21 DIAGNOSIS — R55 Syncope and collapse: Secondary | ICD-10-CM

## 2013-02-21 DIAGNOSIS — G459 Transient cerebral ischemic attack, unspecified: Secondary | ICD-10-CM

## 2013-02-21 DIAGNOSIS — I10 Essential (primary) hypertension: Secondary | ICD-10-CM

## 2013-02-21 NOTE — Progress Notes (Addendum)
[facility]  New Carotid Patient  Referred by:  Roderick Pee, MD [address] [place], [place] [address]  Reason for referral: L ICA carotid stenosis  History of Present Illness  Thomas [name] is a 79 y.o. (October 20, 1925) male who presents with chief complaint: dysequilibrium and syncope.  Previous carotid studies demonstrated: RICA 1-39% stenosis, LICA 80-99% stenosis.  Patient has no history of TIA or stroke symptom.  The patient has never had amaurosis fugax or monocular blindness.  The patient has never had facial drooping or hemiplegia.  The patient has never had receptive or expressive aphasia.   The patient notes intermittent non-positional vertigo, which he interprets as dysequilibrium.  He also notes passing out on 3 different occasions without reproducible triggers.  The patient's risks factors for carotid disease include: HTN.  Past Medical History  Diagnosis Date  . Hypertension   . Allergy   . ED (erectile dysfunction)   . Cancer     prostate    Past Surgical History  Procedure Laterality Date  . Dilation of the esophagus    . Prostate surgery      Laser surgery    History   Social History  . Marital Status: Married    Spouse Name: N/A    Number of Children: N/A  . Years of Education: N/A   Occupational History  . Not on file.   Social History Main Topics  . Smoking status: Former Games developer  . Smokeless tobacco: Never Used  . Alcohol Use: No     Comment: daily--one alcohol drink a day  . Drug Use: No  . Sexually Active: Not on file   Other Topics Concern  . Not on file   Social History Narrative  . No narrative on file   Family History  Problem Relation Age of Onset  . Heart disease Father   . Cancer Mother   . Cancer Sister   . Hyperlipidemia Daughter   . Hypertension Daughter   . Hypertension Son     Current Outpatient Prescriptions on File Prior to Visit  Medication Sig Dispense Refill  . aspirin 325 MG  tablet 1 po qd  30 tablet  0  . cloNIDine (CATAPRES) 0.2 MG tablet TAKE 1 TABLET (0.2 MG TOTAL) BY MOUTH 2 (TWO) TIMES DAILY.  200 tablet  4  . ibuprofen (ADVIL,MOTRIN) 200 MG tablet Take 200 mg by mouth every 6 (six) hours as needed.        . meloxicam (MOBIC) 15 MG tablet TAKE 1 TABLET (15 MG TOTAL) BY MOUTH DAILY.  30 tablet  0  . omeprazole (PRILOSEC) 40 MG capsule Take 40 mg by mouth daily.      . verapamil (CALAN-SR) 240 MG CR tablet TAKE 1 TABLET (240 MG TOTAL) BY MOUTH AT BEDTIME.  100 tablet  3   No current facility-administered medications on file prior to visit.    Allergies  Allergen Reactions  . Ace Inhibitors     REACTION: Hives  . Penicillins     REACTION: Swelling-face    REVIEW OF SYSTEMS:  (Positives checked otherwise negative)  CARDIOVASCULAR:  []  chest pain, []  chest pressure, []  palpitations, []  shortness of breath when laying flat, []  shortness of breath with exertion,  []  pain in feet when walking, []  pain in feet when laying flat, []  history of blood clot in veins (DVT), []  history of phlebitis, []  swelling in legs, []  varicose veins  PULMONARY:  []  productive cough, []   asthma, []  wheezing  NEUROLOGIC:  []  weakness in arms or legs, []  numbness in arms or legs, []  difficulty speaking or slurred speech, []  temporary loss of vision in one eye, [x]  dizziness  HEMATOLOGIC:  []  bleeding problems, []  problems with blood clotting too easily  MUSCULOSKEL:  []  joint pain, []  joint swelling  GASTROINTEST:  []  vomiting blood, []  blood in stool     GENITOURINARY:  []  burning with urination, []  blood in urine  PSYCHIATRIC:  []  history of major depression  INTEGUMENTARY:  []  rashes, []  ulcers  CONSTITUTIONAL:  []  fever, []  chills  For VQI Use Only  PRE-ADM LIVING: Home  AMB STATUS: Ambulatory  CAD Sx: None  PRIOR CHF: None  STRESS TEST: [x]  No, [ ]  Normal, [ ]  + ischemia, [ ]  + MI, [ ]  Both  Physical Examination  Filed Vitals:   02/21/13 1602 02/21/13  1605 02/21/13 1610  BP: 189/121 187/116 170/90  Pulse: 77 84   Resp: 18    Height: 5\' 10"  (1.778 m)    Weight: 184 lb (83.462 kg)    SpO2: 97%     Body mass index is 26.4 kg/(m^2).  General: A&O x 3, WD, elderly  Head: [place]/AT  Ear/Nose/Throat: Hearing grossly intact, nares w/o erythema or drainage, oropharynx w/o Erythema/Exudate, Mallampati score: 3  Eyes: PERRLA, EOMI, Post surg chg to pupils,   Neck: Supple, no nuchal rigidity, no palpable LAD  Pulmonary: Sym exp, good air movt, CTAB, no rales, rhonchi, & wheezing  Cardiac: RRR, Nl S1, S2, no Murmurs, rubs or gallops  Vascular: Vessel Right Left  Radial Palpable Palpable  Brachial  Palpable  Palpable  Carotid  Palpable, without bruit  Palpable, without bruit  Aorta  Not palpable N/A  Femoral  Palpable  Palpable  Popliteal  Not palpable  Not palpable  PT  Palpable  Palpable  DP  Palpable Faintly Palpable   Gastrointestinal: soft, NTND, -G/R, - HSM, - masses, - CVAT B  Musculoskeletal: M/S 5/5 throughout except slightly less strength in L shoulder due to pain with AROM, Extremities without ischemic changes   Neurologic: CN 2-12 intact , Pain and light touch intact in extremities , Motor exam as listed above  Psychiatric: Judgment intact, Mood & affect appropriate for pt's clinical situation  Dermatologic: See M/S exam for extremity exam, no rashes otherwise noted  Lymph : No Cervical, Axillary, or Inguinal lymphadenopathy   Non-Invasive Vascular Imaging  [facility] CAROTID DUPLEX (Date: 02/21/2013):   R ICA stenosis: 1-39%  R VA: patent and antegrade  L ICA stenosis: 80-99% (528/286)  L VA:  patent and antegrade  Outside Studies/Documentation 1 page of outside documents were reviewed including: outside carotid duplex.  Medical Decision Making  Thomas [name] is a 79 y.o. male who presents with: asx  L ICA stenosis likely >90%, syncope of unknown etiology, likely hyperlipidemia   Based on the patient's  vascular studies and examination, I have offered the patient: preoperative cardiac risk stratification and optimization prior to L CEA.  I doubt this patient's syncope is do to his carotid disease as syncope due to carotid disease requires near global hypoperfusion.  He doesn't have convincing evidence of vertebral artery disease as the source of his vertigo.  I discussed in depth with the patient the nature of atherosclerosis, and emphasized the importance of maximal medical management including strict control of blood pressure, blood glucose, and lipid levels, obtaining regular exercise, antiplatelet agents, and cessation of smoking.    The  patient is currently on an antiplatelet: ASA .    The patient is currently not on a statin.  Based on previous lipid panels, he likely would benefit from such.  We will discuss this on subsequent follow-up.  The patient is aware that without maximal medical management the underlying atherosclerotic disease process will progress, limiting the benefit of any interventions.  He will follow up next Friday to determine if we can proceed with subsequent intervention.  Thank you for allowing [place] to participate in this patient's care.  Leonides Sake, MD [facility] Office: [phone] Pager: [phone]  02/21/2013, 5:06 PM

## 2013-02-27 ENCOUNTER — Ambulatory Visit (INDEPENDENT_AMBULATORY_CARE_PROVIDER_SITE_OTHER): Payer: Medicare Other | Admitting: Internal Medicine

## 2013-02-27 ENCOUNTER — Encounter: Payer: Self-pay | Admitting: Internal Medicine

## 2013-02-27 ENCOUNTER — Encounter: Payer: Self-pay | Admitting: Vascular Surgery

## 2013-02-27 VITALS — BP 148/98 | HR 67 | Ht 70.0 in | Wt 183.0 lb

## 2013-02-27 DIAGNOSIS — I6529 Occlusion and stenosis of unspecified carotid artery: Secondary | ICD-10-CM

## 2013-02-27 MED ORDER — ROSUVASTATIN CALCIUM 10 MG PO TABS: 10.0000 mg | ORAL_TABLET | Freq: Every day | ORAL | Status: DC

## 2013-02-27 NOTE — Patient Instructions (Signed)
New Medication    Crestor 10mg  each evening at bedtime  Return to our office in 6 weeks for fasting cholesterol levels   Follow-up with your Primary care Dr. Tawanna Cooler

## 2013-02-27 NOTE — Progress Notes (Signed)
HPI Patinet is an 79 yo who was referred for cardiac risk stratification.  Patinet being evaluated for possible endarterectomy The patient has no history of CAD  He does have CV disease with severe stenosis of L ICA  He reports that he swims at the [facility] several times per week.  Swims about 6 laps when he does this.(crawl) He walks  He plays some golf  Notes no change in his ability to do this.  No CP  No SOB   No dizziness.  He has had 2 spells recently of unsteadiness  One occurred while making coffee  He felt himself getting a little unsteady  Larey Seat  Did not pass out.  Aware of all events. He had onother spell where he felt numb on R side  Sat down  Resolved in about 15 min  Did not pass out  Denies palpitations. ONly one episode of documented palpitations  Occurred last fall in Dr Barbette Or office Occurred after rapid standing. None since. Allergies  Allergen Reactions  . Ace Inhibitors     REACTION: Hives  . Penicillins     REACTION: Swelling-face    Current Outpatient Prescriptions  Medication Sig Dispense Refill  . aspirin 325 MG tablet 1 po qd  30 tablet  0  . cloNIDine (CATAPRES) 0.2 MG tablet TAKE 1 TABLET (0.2 MG TOTAL) BY MOUTH 2 (TWO) TIMES DAILY.  200 tablet  4  . ibuprofen (ADVIL,MOTRIN) 200 MG tablet Take 200 mg by mouth every 6 (six) hours as needed.        . meloxicam (MOBIC) 15 MG tablet TAKE 1 TABLET (15 MG TOTAL) BY MOUTH DAILY.  30 tablet  0  . omeprazole (PRILOSEC) 40 MG capsule Take 40 mg by mouth daily.      . verapamil (CALAN-SR) 240 MG CR tablet TAKE 1 TABLET (240 MG TOTAL) BY MOUTH AT BEDTIME.  100 tablet  3   No current facility-administered medications for this visit.    Past Medical History  Diagnosis Date  . Hypertension   . Allergy   . ED (erectile dysfunction)   . Cancer     prostate    Past Surgical History  Procedure Laterality Date  . Dilation of the esophagus    . Prostate surgery      Laser surgery    Family History  Problem Relation  Age of Onset  . Heart disease Father   . Cancer Mother   . Cancer Sister   . Hyperlipidemia Daughter   . Hypertension Daughter   . Hypertension Son     History   Social History  . Marital Status: Married    Spouse Name: N/A    Number of Children: N/A  . Years of Education: N/A   Occupational History  . Not on file.   Social History Main Topics  . Smoking status: Former Games developer  . Smokeless tobacco: Never Used  . Alcohol Use: No     Comment: daily--one alcohol drink a day  . Drug Use: No  . Sexually Active: Not on file   Other Topics Concern  . Not on file   Social History Narrative  . No narrative on file    Review of Systems:  All systems reviewed.  They are negative to the above problem except as previously stated.  Vital Signs: BP 148/98  Pulse 67  Ht 5\' 10"  (1.778 m)  Wt 183 lb (83.008 kg)  BMI 26.26 kg/m2  Physical Exam Patinet is in NAD  HEENT:  Normocephalic, atraumatic. EOMI, PERRLA.  Neck: JVP is normal.  No bruits.  Lungs: clear to auscultation. No rales no wheezes.  Heart: Regular rate and rhythm. Normal S1, S2. No S3.   No significant murmurs. PMI not displaced.  Abdomen:  Supple, nontender. Normal bowel sounds. No masses. No hepatomegaly.  Extremities:   Good distal pulses throughout. No lower extremity edema.  Musculoskeletal :moving all extremities.  Neuro:   alert and oriented x3.  CN II-XII grossly intact.  EKG SR 67 bpm.  T wave inversion III, AVF, V5, V6.  No signif change from previous ECG  Assessment and Plan: 1.  Preop evaluation.  Patient is an 79 yo with severe carotid stenoses.  He is being evaluated for possible surgery. From a cardiac standpoint I think he is at a low risk for periop cardiac event.  He probably has CAD but he is relatively active and is asymtomatic I would not schedule any further cardiac tests prior.  2.  HL  The patient has signif hyperlipidemia with LDL greater than 175  Last level prior to this was 145  He  denies any differences in his diet. With these levels he should be on a statin.  I have recomm Crestor 10 mg  I have given him samples  Will plan to check lipid panel and AST in 6 wks  3.  CV disease.  Has f/u appt with Dr Imogene Burn on Friday.  4.  HTN  Follow.    Will be available as needed   The patient normally follows up with Jim Desanctis.

## 2013-02-28 ENCOUNTER — Encounter: Payer: Self-pay | Admitting: Vascular Surgery

## 2013-02-28 ENCOUNTER — Other Ambulatory Visit: Payer: Self-pay | Admitting: *Deleted

## 2013-02-28 ENCOUNTER — Ambulatory Visit (INDEPENDENT_AMBULATORY_CARE_PROVIDER_SITE_OTHER): Payer: Medicare Other | Admitting: Vascular Surgery

## 2013-02-28 DIAGNOSIS — I6529 Occlusion and stenosis of unspecified carotid artery: Secondary | ICD-10-CM

## 2013-02-28 NOTE — Progress Notes (Signed)
[facility] [place]  Established Carotid Patient  History of Present Illness  Thomas [name] is a 79 y.o. (12/27/1925) male who presents with chief complaint: L ICA stenosis.  Previous carotid studies demonstrated: RICA 1-39% stenosis, LICA 80-99% stenosis.  Pt has had no CVA or TIA sx since last visit.  He returns from his cardiology visit.  He has been deemed low risk by his cardiologist.    Past Medical History  Diagnosis Date  . Hypertension   . Allergy   . ED (erectile dysfunction)   . Cancer     prostate    Past Surgical History  Procedure Laterality Date  . Dilation of the esophagus    . Prostate surgery      Laser surgery    History   Social History  . Marital Status: Married    Spouse Name: N/A    Number of Children: N/A  . Years of Education: N/A   Occupational History  . Not on file.   Social History Main Topics  . Smoking status: Former Smoker  . Smokeless tobacco: Never Used  . Alcohol Use: No     Comment: daily--one alcohol drink a day  . Drug Use: No  . Sexually Active: Not on file   Other Topics Concern  . Not on file   Social History Narrative  . No narrative on file    Family History  Problem Relation Age of Onset  . Heart disease Father   . Cancer Mother   . Cancer Sister   . Hyperlipidemia Daughter   . Hypertension Daughter   . Hypertension Son    Current Outpatient Prescriptions on File Prior to Visit  Medication Sig Dispense Refill  . aspirin 325 MG tablet 1 po qd  30 tablet  0  . cloNIDine (CATAPRES) 0.2 MG tablet TAKE 1 TABLET (0.2 MG TOTAL) BY MOUTH 2 (TWO) TIMES DAILY.  200 tablet  4  . ibuprofen (ADVIL,MOTRIN) 200 MG tablet Take 200 mg by mouth every 6 (six) hours as needed.        . meloxicam (MOBIC) 15 MG tablet TAKE 1 TABLET (15 MG TOTAL) BY MOUTH DAILY.  30 tablet  0  . omeprazole (PRILOSEC) 40 MG capsule Take 40 mg by mouth daily.      . rosuvastatin (CRESTOR) 10 MG tablet Take 1 tablet (10 mg  total) by mouth daily.  90 tablet  3  . verapamil (CALAN-SR) 240 MG CR tablet TAKE 1 TABLET (240 MG TOTAL) BY MOUTH AT BEDTIME.  100 tablet  3   No current facility-administered medications on file prior to visit.    Allergies  Allergen Reactions  . Ace Inhibitors     REACTION: Hives  . Penicillins     REACTION: Swelling-face    REVIEW OF SYSTEMS: (Positives checked otherwise negative)  CARDIOVASCULAR: [] chest pain, [] chest pressure, [] palpitations, [] shortness of breath when laying flat, [] shortness of breath with exertion, [] pain in feet when walking, [] pain in feet when laying flat, [] history of blood clot in veins (DVT), [] history of phlebitis, [] swelling in legs, [] varicose veins  PULMONARY: [] productive cough, [] asthma, [] wheezing  NEUROLOGIC: [] weakness in arms or legs, [] numbness in arms or legs, [] difficulty speaking or slurred speech, [] temporary loss of vision in one eye, [x] dizziness  HEMATOLOGIC: [] bleeding problems, [] problems with blood clotting too easily  MUSCULOSKEL: [] joint pain, []   joint swelling  GASTROINTEST: [] vomiting blood, [] blood in stool  GENITOURINARY: [] burning with urination, [] blood in urine  PSYCHIATRIC: [] history of major depression  INTEGUMENTARY: [] rashes, [] ulcers  CONSTITUTIONAL: [] fever, [] chills   For VQI Use Only  PRE-ADM LIVING: Home  AMB STATUS: Ambulatory  CAD Sx: None  PRIOR CHF: None  STRESS TEST: [x] No, [ ] Normal, [ ] + ischemia, [ ] + MI, [ ] Both  Physical Examination  Filed Vitals:   02/28/13 1534 02/28/13 1536  BP: 162/85 151/82  Pulse: 72   Height: 5' 10" (1.778 m)   Weight: 185 lb 6.4 oz (84.097 kg)   SpO2: 95%    Body mass index is 26.6 kg/(m^2).  General: A&O x 3, WDWN  Eyes: PERRLA, EOMI  Pulmonary: Sym exp, good air movt, CTAB, no rales, rhonchi, & wheezing  Cardiac: RRR, Nl S1, S2, no Murmurs, rubs or gallops  Vascular:  Vessel  Right  Left   Radial  Palpable  Palpable    Brachial  Palpable  Palpable   Carotid  Palpable, without bruit  Palpable, without bruit   Aorta  Not palpable  N/A   Femoral  Palpable  Palpable   Popliteal  Not palpable  Not palpable   PT  Palpable  Palpable   DP  Palpable  Faintly Palpable    Gastrointestinal: soft, NTND, -G/R, - HSM, - masses, - CVAT B  Musculoskeletal: M/S 5/5 throughout , Extremities without ischemic changes   Neurologic: CN 2-12 intact , Pain and light touch intact in extremities , Motor exam as listed above  Medical Decision Making  Thomas [name] is a 79 y.o. male who presents with: asx high grade L ICA stenosis.   Based on the patient's vascular studies and examination, I have offered the patient: L CEA. I discussed with the patient the risks, benefits, and alternatives to carotid endarterectomy.   The patient is not a good candidate for carotid artery stenting due to the findings of CREST with age>70 years old.   I discussed the procedural details of carotid endarterectomy with the patient.  The patient is aware that the risks of carotid endarterectomy include but are not limited to: bleeding, infection, stroke, myocardial infarction, death, cranial nerve injuries both temporary and permanent, neck hematoma, possible airway compromise, labile blood pressure post-operatively, cerebral hyperperfusion syndrome, and possible need for additional interventions in the future.  On ultrasound, his L carotid bifurcation is found to be at the angle of the jaw, so he is at some risk for cranial nerve injury.  I discussed the implication with the patient and he understands the risks. The patient is aware of the risks and agrees to proceed forward with the procedure.  I discussed in depth with the patient the nature of atherosclerosis, and emphasized the importance of maximal medical management including strict control of blood pressure, blood glucose, and lipid levels, antiplatelet agents, obtaining regular exercise, and  cessation of smoking.  The patient is aware that without maximal medical management the underlying atherosclerotic disease process will progress, limiting the benefit of any interventions.  Thank you for allowing us to participate in this patient's care.  Brian Chen, MD [facility] [place] Office: [phone] Pager: [phone]  02/28/2013, 5:19 PM     

## 2013-03-03 ENCOUNTER — Telehealth: Payer: Self-pay | Admitting: Family Medicine

## 2013-03-03 ENCOUNTER — Encounter (HOSPITAL_COMMUNITY): Payer: Self-pay | Admitting: Pharmacy Technician

## 2013-03-03 NOTE — Telephone Encounter (Signed)
Noted  

## 2013-03-03 NOTE — Pre-Procedure Instructions (Signed)
Keatin Benham  03/03/2013   Your procedure is scheduled on:  Wednesday, July 23rd.  Report to [facility] at 6:30AM.  Call this number if you have problems the morning of surgery: [phone]   Remember:   Do not eat food or drink liquids after midnight.   Take these medicines the morning of surgery with A SIP OF WATER: cloNIDine (CATAPRES), Omeprazole    Do not wear jewelry, make-up or nail polish.  Do not wear lotions, powders, or perfumes.   Men may shave face and neck.  Do not bring valuables to the hospital.  [facility] is not responsible  for any belongings or valuables.  Contacts, dentures or bridgework may not be worn into surgery.  Leave suitcase in the car. After surgery it may be brought to your room.  For patients admitted to the hospital, checkout time is 11:00 AM the day of discharge.    Special Instructions: Shower using CHG 2 nights before surgery and the night before surgery.  If you shower the day of surgery use CHG.  Use special wash - you have one bottle of CHG for all showers.  You should use approximately 1/3 of the bottle for each shower.   Please read over the following fact sheets that you were given: Pain Booklet, Coughing and Deep Breathing, Blood Transfusion Information and Surgical Site Infection Prevention

## 2013-03-03 NOTE — Telephone Encounter (Signed)
Pt daughter called and stated that he will be proceeding with vascular surgery on 7/23 at 6:30am. FYI.

## 2013-03-04 ENCOUNTER — Encounter (HOSPITAL_COMMUNITY)
Admission: RE | Admit: 2013-03-04 | Discharge: 2013-03-04 | Disposition: A | Discharge status: Home / Self Care | Payer: Medicare Other | Source: Ambulatory Visit | Attending: Vascular Surgery | Admitting: Vascular Surgery

## 2013-03-04 ENCOUNTER — Encounter (HOSPITAL_COMMUNITY): Payer: Self-pay

## 2013-03-04 LAB — SURGICAL PCR SCREEN: MRSA, PCR: NEGATIVE

## 2013-03-04 LAB — COMPREHENSIVE METABOLIC PANEL
ALT: 13 U/L (ref 0–53)
AST: 15 U/L (ref 0–37)
CO2: 25 mEq/L (ref 19–32)
Calcium: 9.4 mg/dL (ref 8.4–10.5)
Chloride: 102 mEq/L (ref 96–112)
GFR calc non Af Amer: 47 mL/min — ABNORMAL LOW (ref >90)
Potassium: 3.8 mEq/L (ref 3.5–5.1)
Sodium: 137 mEq/L (ref 135–145)

## 2013-03-04 LAB — CBC
MCH: 33.6 pg (ref 26.0–34.0)
Platelets: 203 10*3/uL (ref 150–400)
RBC: 4.28 MIL/uL (ref 4.22–5.81)
WBC: 5.8 10*3/uL (ref 4.0–10.5)

## 2013-03-04 LAB — URINALYSIS, ROUTINE W REFLEX MICROSCOPIC
Glucose, UA: NEGATIVE mg/dL
Leukocytes, UA: NEGATIVE
Protein, ur: NEGATIVE mg/dL
Urobilinogen, UA: 0.2 mg/dL (ref 0.0–1.0)

## 2013-03-04 LAB — APTT: aPTT: 31 seconds (ref 24–37)

## 2013-03-04 LAB — TYPE AND SCREEN: ABO/RH(D): O POS

## 2013-03-04 MED ORDER — VANCOMYCIN HCL 10 G IV SOLR
1500.0000 mg | INTRAVENOUS | Status: AC
  Administered 2013-03-05: 1500 mg via INTRAVENOUS
  Filled 2013-03-04: qty 1500

## 2013-03-04 NOTE — Progress Notes (Signed)
Carol at office notified of positive PCR will start Mupirocin in am

## 2013-03-04 NOTE — Progress Notes (Signed)
03/04/13 0833  OBSTRUCTIVE SLEEP APNEA  Have you ever been diagnosed with sleep apnea through a sleep study? No  Do you snore loudly (loud enough to be heard through closed doors)?  0  Do you often feel tired, fatigued, or sleepy during the daytime? 0  Has anyone observed you stop breathing during your sleep? 0  Do you have, or are you being treated for high blood pressure? 1  BMI more than 35 kg/m2? 0  Age over 77 years old? 1  Neck circumference greater than 40 cm/18 inches? 1  Gender: 1  Obstructive Sleep Apnea Score 4  Score 4 or greater  Results sent to PCP

## 2013-03-05 ENCOUNTER — Inpatient Hospital Stay (HOSPITAL_COMMUNITY): Payer: Medicare Other | Admitting: Anesthesiology

## 2013-03-05 ENCOUNTER — Inpatient Hospital Stay (HOSPITAL_COMMUNITY)
Admission: RE | Admit: 2013-03-05 | Discharge: 2013-03-06 | DRG: 039 | Disposition: A | Discharge status: Home / Self Care | Payer: Medicare Other | Source: Ambulatory Visit | Attending: Vascular Surgery | Admitting: Vascular Surgery

## 2013-03-05 ENCOUNTER — Encounter (HOSPITAL_COMMUNITY): Payer: Self-pay | Admitting: Anesthesiology

## 2013-03-05 ENCOUNTER — Telehealth: Payer: Self-pay | Admitting: Vascular Surgery

## 2013-03-05 ENCOUNTER — Encounter (HOSPITAL_COMMUNITY): Payer: Self-pay | Admitting: *Deleted

## 2013-03-05 ENCOUNTER — Encounter (HOSPITAL_COMMUNITY)
Admission: RE | Disposition: A | Discharge status: Home / Self Care | Payer: Self-pay | Source: Ambulatory Visit | Attending: Vascular Surgery

## 2013-03-05 ENCOUNTER — Telehealth: Payer: Self-pay | Admitting: Family Medicine

## 2013-03-05 DIAGNOSIS — I6529 Occlusion and stenosis of unspecified carotid artery: Principal | ICD-10-CM | POA: Diagnosis present

## 2013-03-05 DIAGNOSIS — Z809 Family history of malignant neoplasm, unspecified: Secondary | ICD-10-CM

## 2013-03-05 DIAGNOSIS — Z888 Allergy status to other drugs, medicaments and biological substances status: Secondary | ICD-10-CM

## 2013-03-05 DIAGNOSIS — Z01812 Encounter for preprocedural laboratory examination: Secondary | ICD-10-CM

## 2013-03-05 DIAGNOSIS — Z8249 Family history of ischemic heart disease and other diseases of the circulatory system: Secondary | ICD-10-CM

## 2013-03-05 DIAGNOSIS — Z880 Allergy status to penicillin: Secondary | ICD-10-CM

## 2013-03-05 DIAGNOSIS — N529 Male erectile dysfunction, unspecified: Secondary | ICD-10-CM | POA: Diagnosis present

## 2013-03-05 DIAGNOSIS — Z7982 Long term (current) use of aspirin: Secondary | ICD-10-CM

## 2013-03-05 DIAGNOSIS — Z7902 Long term (current) use of antithrombotics/antiplatelets: Secondary | ICD-10-CM

## 2013-03-05 DIAGNOSIS — Z8546 Personal history of malignant neoplasm of prostate: Secondary | ICD-10-CM

## 2013-03-05 DIAGNOSIS — Z79899 Other long term (current) drug therapy: Secondary | ICD-10-CM

## 2013-03-05 DIAGNOSIS — Z87891 Personal history of nicotine dependence: Secondary | ICD-10-CM

## 2013-03-05 DIAGNOSIS — I10 Essential (primary) hypertension: Secondary | ICD-10-CM | POA: Diagnosis present

## 2013-03-05 HISTORY — PX: ENDARTERECTOMY: SHX5162

## 2013-03-05 SURGERY — ENDARTERECTOMY, CAROTID
Anesthesia: General | Site: Neck | Laterality: Left | Wound class: Clean

## 2013-03-05 MED ORDER — CLOPIDOGREL BISULFATE 75 MG PO TABS: 75.0000 mg | ORAL_TABLET | Freq: Every day | ORAL | Status: DC

## 2013-03-05 MED ORDER — LIDOCAINE HCL (PF) 1 % IJ SOLN
INTRAMUSCULAR | Status: AC
  Filled 2013-03-05: qty 30

## 2013-03-05 MED ORDER — HYDRALAZINE HCL 20 MG/ML IJ SOLN
10.0000 mg | INTRAMUSCULAR | Status: DC | PRN
  Administered 2013-03-05: 10 mg via INTRAVENOUS
  Filled 2013-03-05: qty 1

## 2013-03-05 MED ORDER — SODIUM CHLORIDE 0.9 % IV SOLN: 500.0000 mL | Freq: Once | INTRAVENOUS | Status: AC | PRN

## 2013-03-05 MED ORDER — MUPIROCIN 2 % EX OINT
TOPICAL_OINTMENT | Freq: Two times a day (BID) | CUTANEOUS | Status: DC
  Filled 2013-03-05: qty 22

## 2013-03-05 MED ORDER — PROPOFOL 10 MG/ML IV BOLUS
INTRAVENOUS | Status: DC | PRN
  Administered 2013-03-05: 200 mg via INTRAVENOUS

## 2013-03-05 MED ORDER — GLYCOPYRROLATE 0.2 MG/ML IJ SOLN
INTRAMUSCULAR | Status: DC | PRN
  Administered 2013-03-05: 0.4 mg via INTRAVENOUS

## 2013-03-05 MED ORDER — METOPROLOL TARTRATE 1 MG/ML IV SOLN: 2.0000 mg | INTRAVENOUS | Status: DC | PRN

## 2013-03-05 MED ORDER — PHENOL 1.4 % MT LIQD: 1.0000 | OROMUCOSAL | Status: DC | PRN

## 2013-03-05 MED ORDER — OXYCODONE HCL 5 MG/5ML PO SOLN: 5.0000 mg | Freq: Once | ORAL | Status: DC | PRN

## 2013-03-05 MED ORDER — ALUM & MAG HYDROXIDE-SIMETH 200-200-20 MG/5ML PO SUSP: 15.0000 mL | ORAL | Status: DC | PRN

## 2013-03-05 MED ORDER — HEPARIN SODIUM (PORCINE) 1000 UNIT/ML IJ SOLN
INTRAMUSCULAR | Status: DC | PRN
  Administered 2013-03-05: 7000 [IU] via INTRAVENOUS

## 2013-03-05 MED ORDER — LABETALOL HCL 5 MG/ML IV SOLN
10.0000 mg | INTRAVENOUS | Status: DC | PRN
  Administered 2013-03-05: 10 mg via INTRAVENOUS
  Filled 2013-03-05 (×2): qty 4

## 2013-03-05 MED ORDER — ACETAMINOPHEN 325 MG PO TABS: 325.0000 mg | ORAL_TABLET | ORAL | Status: DC | PRN

## 2013-03-05 MED ORDER — NEOSTIGMINE METHYLSULFATE 1 MG/ML IJ SOLN
INTRAMUSCULAR | Status: DC | PRN
  Administered 2013-03-05: 3 mg via INTRAVENOUS

## 2013-03-05 MED ORDER — METOCLOPRAMIDE HCL 5 MG/ML IJ SOLN: 10.0000 mg | Freq: Once | INTRAMUSCULAR | Status: DC | PRN

## 2013-03-05 MED ORDER — VERAPAMIL HCL ER 240 MG PO TBCR
240.0000 mg | EXTENDED_RELEASE_TABLET | Freq: Every day | ORAL | Status: DC
Start: 2013-03-05 — End: 2013-03-06
  Administered 2013-03-05: 240 mg via ORAL
  Filled 2013-03-05 (×2): qty 1

## 2013-03-05 MED ORDER — SODIUM CHLORIDE 0.9 % IR SOLN
Status: DC | PRN
  Administered 2013-03-05: 09:00:00

## 2013-03-05 MED ORDER — CLOPIDOGREL BISULFATE 75 MG PO TABS
75.0000 mg | ORAL_TABLET | Freq: Every day | ORAL | Status: DC
  Administered 2013-03-06: 75 mg via ORAL
  Filled 2013-03-05 (×2): qty 1

## 2013-03-05 MED ORDER — GUAIFENESIN-DM 100-10 MG/5ML PO SYRP: 15.0000 mL | ORAL_SOLUTION | ORAL | Status: DC | PRN

## 2013-03-05 MED ORDER — DOCUSATE SODIUM 100 MG PO CAPS
100.0000 mg | ORAL_CAPSULE | Freq: Every day | ORAL | Status: DC
  Filled 2013-03-05: qty 1

## 2013-03-05 MED ORDER — ROCURONIUM BROMIDE 100 MG/10ML IV SOLN
INTRAVENOUS | Status: DC | PRN
  Administered 2013-03-05: 50 mg via INTRAVENOUS

## 2013-03-05 MED ORDER — ONDANSETRON HCL 4 MG/2ML IJ SOLN
INTRAMUSCULAR | Status: DC | PRN
  Administered 2013-03-05: 4 mg via INTRAVENOUS

## 2013-03-05 MED ORDER — HYDRALAZINE HCL 20 MG/ML IJ SOLN
INTRAMUSCULAR | Status: AC
  Administered 2013-03-05: 10 mg via INTRAVENOUS
  Filled 2013-03-05: qty 1

## 2013-03-05 MED ORDER — PROTAMINE SULFATE 10 MG/ML IV SOLN
INTRAVENOUS | Status: DC | PRN
  Administered 2013-03-05 (×3): 10 mg via INTRAVENOUS

## 2013-03-05 MED ORDER — LIDOCAINE HCL 4 % MT SOLN
OROMUCOSAL | Status: DC | PRN
  Administered 2013-03-05: 4 mL via TOPICAL

## 2013-03-05 MED ORDER — CLOPIDOGREL BISULFATE 300 MG PO TABS
300.0000 mg | ORAL_TABLET | Freq: Once | ORAL | Status: AC
  Administered 2013-03-05: 300 mg via ORAL
  Filled 2013-03-05: qty 1

## 2013-03-05 MED ORDER — MUPIROCIN 2 % EX OINT
TOPICAL_OINTMENT | CUTANEOUS | Status: AC
  Administered 2013-03-05: 1 via NASAL
  Filled 2013-03-05: qty 22

## 2013-03-05 MED ORDER — VANCOMYCIN HCL IN DEXTROSE 1-5 GM/200ML-% IV SOLN: 1000.0000 mg | Freq: Two times a day (BID) | INTRAVENOUS | Status: DC

## 2013-03-05 MED ORDER — FENTANYL CITRATE 0.05 MG/ML IJ SOLN
INTRAMUSCULAR | Status: DC | PRN
  Administered 2013-03-05: 50 ug via INTRAVENOUS
  Administered 2013-03-05: 150 ug via INTRAVENOUS
  Administered 2013-03-05: 50 ug via INTRAVENOUS

## 2013-03-05 MED ORDER — VANCOMYCIN HCL IN DEXTROSE 1-5 GM/200ML-% IV SOLN
1000.0000 mg | Freq: Once | INTRAVENOUS | Status: AC
  Administered 2013-03-06: 1000 mg via INTRAVENOUS
  Filled 2013-03-05: qty 200

## 2013-03-05 MED ORDER — ACETAMINOPHEN 325 MG RE SUPP
325.0000 mg | RECTAL | Status: DC | PRN
  Filled 2013-03-05: qty 2

## 2013-03-05 MED ORDER — THROMBIN 20000 UNITS EX SOLR
CUTANEOUS | Status: AC
  Filled 2013-03-05: qty 20000

## 2013-03-05 MED ORDER — LIDOCAINE HCL (CARDIAC) 20 MG/ML IV SOLN
INTRAVENOUS | Status: DC | PRN
  Administered 2013-03-05: 100 mg via INTRAVENOUS

## 2013-03-05 MED ORDER — ONDANSETRON HCL 4 MG/2ML IJ SOLN
4.0000 mg | Freq: Four times a day (QID) | INTRAMUSCULAR | Status: DC | PRN
  Administered 2013-03-05 – 2013-03-06 (×2): 4 mg via INTRAVENOUS
  Filled 2013-03-05 (×2): qty 2

## 2013-03-05 MED ORDER — ATORVASTATIN CALCIUM 20 MG PO TABS
20.0000 mg | ORAL_TABLET | Freq: Every day | ORAL | Status: DC
  Administered 2013-03-05: 20 mg via ORAL
  Filled 2013-03-05 (×2): qty 1

## 2013-03-05 MED ORDER — LACTATED RINGERS IV SOLN
INTRAVENOUS | Status: DC | PRN
  Administered 2013-03-05 (×2): via INTRAVENOUS

## 2013-03-05 MED ORDER — 0.9 % SODIUM CHLORIDE (POUR BTL) OPTIME
TOPICAL | Status: DC | PRN
  Administered 2013-03-05: 2000 mL

## 2013-03-05 MED ORDER — DEXTRAN 40 IN SALINE 10-0.9 % IV SOLN
INTRAVENOUS | Status: AC
  Filled 2013-03-05: qty 500

## 2013-03-05 MED ORDER — HYDROMORPHONE HCL PF 1 MG/ML IJ SOLN: 0.5000 mg | INTRAMUSCULAR | Status: DC | PRN

## 2013-03-05 MED ORDER — THROMBIN 20000 UNITS EX SOLR
OROMUCOSAL | Status: DC | PRN
  Administered 2013-03-05: 11:00:00 via TOPICAL

## 2013-03-05 MED ORDER — OXYCODONE-ACETAMINOPHEN 5-325 MG PO TABS: 1.0000 | ORAL_TABLET | ORAL | Status: DC | PRN

## 2013-03-05 MED ORDER — SODIUM CHLORIDE 0.9 % IV SOLN: INTRAVENOUS | Status: DC

## 2013-03-05 MED ORDER — ASPIRIN EC 81 MG PO TBEC
81.0000 mg | DELAYED_RELEASE_TABLET | Freq: Every day | ORAL | Status: DC
  Administered 2013-03-06: 81 mg via ORAL
  Filled 2013-03-05: qty 1

## 2013-03-05 MED ORDER — DEXTRAN 40 IN SALINE 10-0.9 % IV SOLN
INTRAVENOUS | Status: DC | PRN
  Administered 2013-03-05: 500 mL

## 2013-03-05 MED ORDER — OXYCODONE HCL 5 MG PO TABS: 5.0000 mg | ORAL_TABLET | Freq: Once | ORAL | Status: DC | PRN

## 2013-03-05 MED ORDER — POTASSIUM CHLORIDE CRYS ER 20 MEQ PO TBCR: 20.0000 meq | EXTENDED_RELEASE_TABLET | Freq: Once | ORAL | Status: AC | PRN

## 2013-03-05 MED ORDER — DEXTROSE-NACL 5-0.45 % IV SOLN
INTRAVENOUS | Status: DC
  Administered 2013-03-05 – 2013-03-06 (×2): via INTRAVENOUS

## 2013-03-05 MED ORDER — FENTANYL CITRATE 0.05 MG/ML IJ SOLN: 25.0000 ug | INTRAMUSCULAR | Status: DC | PRN

## 2013-03-05 MED ORDER — CLONIDINE HCL 0.2 MG PO TABS
0.2000 mg | ORAL_TABLET | Freq: Two times a day (BID) | ORAL | Status: DC
  Administered 2013-03-05 – 2013-03-06 (×2): 0.2 mg via ORAL
  Filled 2013-03-05 (×3): qty 1

## 2013-03-05 MED ORDER — OXYCODONE-ACETAMINOPHEN 5-325 MG PO TABS
1.0000 | ORAL_TABLET | ORAL | Status: DC | PRN
  Administered 2013-03-05 – 2013-03-06 (×3): 1 via ORAL
  Filled 2013-03-05 (×3): qty 1

## 2013-03-05 MED ORDER — ASPIRIN 81 MG PO TABS: 81.0000 mg | ORAL_TABLET | Freq: Every day | ORAL | Status: DC

## 2013-03-05 SURGICAL SUPPLY — 56 items
ADH SKN CLS APL DERMABOND .7 (GAUZE/BANDAGES/DRESSINGS) ×1
BAG DECANTER FOR FLEXI CONT (MISCELLANEOUS) ×2 IMPLANT
CANISTER SUCTION 2500CC (MISCELLANEOUS) ×2 IMPLANT
CATH ROBINSON RED A/P 18FR (CATHETERS) ×2 IMPLANT
CATH SUCT 10FR WHISTLE TIP (CATHETERS) ×2 IMPLANT
CLIP TI MEDIUM 24 (CLIP) ×2 IMPLANT
CLIP TI WIDE RED SMALL 24 (CLIP) ×2 IMPLANT
CLOTH BEACON ORANGE TIMEOUT ST (SAFETY) ×2 IMPLANT
COVER PROBE W GEL 5X96 (DRAPES) IMPLANT
COVER SURGICAL LIGHT HANDLE (MISCELLANEOUS) ×2 IMPLANT
CRADLE DONUT ADULT HEAD (MISCELLANEOUS) ×2 IMPLANT
DERMABOND ADVANCED (GAUZE/BANDAGES/DRESSINGS) ×1
DERMABOND ADVANCED .7 DNX12 (GAUZE/BANDAGES/DRESSINGS) ×1 IMPLANT
DRAPE WARM FLUID 44X44 (DRAPE) ×2 IMPLANT
ELECT CAUTERY BLADE 6.4 (BLADE) ×1 IMPLANT
ELECT REM PT RETURN 9FT ADLT (ELECTROSURGICAL) ×2
ELECTRODE REM PT RTRN 9FT ADLT (ELECTROSURGICAL) ×1 IMPLANT
GLOVE BIO SURGEON STRL SZ7 (GLOVE) ×2 IMPLANT
GLOVE BIOGEL PI IND STRL 6.5 (GLOVE) IMPLANT
GLOVE BIOGEL PI IND STRL 7.0 (GLOVE) IMPLANT
GLOVE BIOGEL PI IND STRL 7.5 (GLOVE) ×1 IMPLANT
GLOVE BIOGEL PI INDICATOR 6.5 (GLOVE) ×3
GLOVE BIOGEL PI INDICATOR 7.0 (GLOVE) ×1
GLOVE BIOGEL PI INDICATOR 7.5 (GLOVE) ×1
GLOVE ECLIPSE 6.5 STRL STRAW (GLOVE) ×1 IMPLANT
GLOVE SS BIOGEL STRL SZ 6.5 (GLOVE) IMPLANT
GLOVE SUPERSENSE BIOGEL SZ 6.5 (GLOVE) ×1
GOWN STRL NON-REIN LRG LVL3 (GOWN DISPOSABLE) ×5 IMPLANT
KIT BASIN OR (CUSTOM PROCEDURE TRAY) ×2 IMPLANT
KIT ROOM TURNOVER OR (KITS) ×2 IMPLANT
NS IRRIG 1000ML POUR BTL (IV SOLUTION) ×4 IMPLANT
PACK CAROTID (CUSTOM PROCEDURE TRAY) ×2 IMPLANT
PAD ARMBOARD 7.5X6 YLW CONV (MISCELLANEOUS) ×4 IMPLANT
PENCIL BUTTON HOLSTER BLD 10FT (ELECTRODE) ×1 IMPLANT
SET COLLECT BLD 21X3/4 12 (NEEDLE) ×1 IMPLANT
SET COLLECT BLD 21X3/4 12 PB (MISCELLANEOUS) ×1 IMPLANT
SHUNT CAROTID BYPASS 10 (VASCULAR PRODUCTS) IMPLANT
SHUNT CAROTID BYPASS 12FRX15.5 (VASCULAR PRODUCTS) IMPLANT
SPONGE SURGIFOAM ABS GEL 100 (HEMOSTASIS) IMPLANT
STOPCOCK 4 WAY LG BORE MALE ST (IV SETS) IMPLANT
SUT ETHILON 3 0 PS 1 (SUTURE) IMPLANT
SUT MNCRL AB 4-0 PS2 18 (SUTURE) ×2 IMPLANT
SUT PROLENE 6 0 BV (SUTURE) ×3 IMPLANT
SUT PROLENE 7 0 BV 1 (SUTURE) IMPLANT
SUT SILK 3 0 TIES 17X18 (SUTURE) ×2
SUT SILK 3-0 18XBRD TIE BLK (SUTURE) IMPLANT
SUT VIC AB 3-0 SH 27 (SUTURE) ×2
SUT VIC AB 3-0 SH 27X BRD (SUTURE) ×1 IMPLANT
SYR TB 1ML LUER SLIP (SYRINGE) IMPLANT
SYSTEM CHEST DRAIN TLS 7FR (DRAIN) IMPLANT
TOWEL OR 17X24 6PK STRL BLUE (TOWEL DISPOSABLE) ×3 IMPLANT
TOWEL OR 17X26 10 PK STRL BLUE (TOWEL DISPOSABLE) ×2 IMPLANT
TRAY FOLEY CATH 14FRSI W/METER (CATHETERS) ×2 IMPLANT
TUBING ART PRESS 48 MALE/FEM (TUBING) ×1 IMPLANT
TUBING EXTENTION W/L.L. (IV SETS) IMPLANT
WATER STERILE IRR 1000ML POUR (IV SOLUTION) ×2 IMPLANT

## 2013-03-05 NOTE — Anesthesia Procedure Notes (Signed)
Procedure Name: Intubation Date/Time: 03/05/2013 8:34 AM Performed by: Arlice Colt B Pre-anesthesia Checklist: Patient identified, Emergency Drugs available, Suction available, Patient being monitored and Timeout performed Patient Re-evaluated:Patient Re-evaluated prior to inductionOxygen Delivery Method: Circle system utilized Preoxygenation: Pre-oxygenation with 100% oxygen Intubation Type: IV induction Ventilation: Mask ventilation without difficulty and Oral airway inserted - appropriate to patient size Laryngoscope Size: Mac and 3 Grade View: Grade I Tube type: Oral Tube size: 7.5 mm Number of attempts: 1 Airway Equipment and Method: Stylet and LTA kit utilized Placement Confirmation: ETT inserted through vocal cords under direct vision,  positive ETCO2 and breath sounds checked- equal and bilateral Secured at: 22 cm Tube secured with: Tape Dental Injury: Teeth and Oropharynx as per pre-operative assessment

## 2013-03-05 NOTE — Telephone Encounter (Signed)
Message copied by Jena Gauss on Wed Mar 05, 2013  4:01 PM ------      Message from: Marlowe Shores      Created: Wed Mar 05, 2013 11:38 AM       2-3 weeks Dr. Imogene Burn - CEA ------

## 2013-03-05 NOTE — Telephone Encounter (Signed)
Pt's surgery went well and he will be at [facility] until tomorrow. Per pt's family.

## 2013-03-05 NOTE — Op Note (Signed)
OPERATIVE NOTE  PROCEDURE:   1.  left carotid endarterectomy with primary repair 2.  left intraoperative carotid ultrasound  PRE-OPERATIVE DIAGNOSIS: left asymptomatic carotid stenosis >80%  POST-OPERATIVE DIAGNOSIS: same as above   SURGEON: Leonides Sake, MD  ASSISTANT(S): Della Goo, [facility]   ANESTHESIA: general  ESTIMATED BLOOD LOSS: 50 cc  FINDING(S): 1.  Continuous Doppler audible flow signatures are appropriate for each carotid artery. 2.  No evidence of intimal flap visualized on transverse or longitudinal ultrasonography. 3.  Ulcerated carotid plaque.  SPECIMEN(S):  None  INDICATIONS:   Thomas [name] is a 79 y.o. male who presents with left asymptomatic carotid stenosis >80%.  I discussed with the patient the risks, benefits, and alternatives to carotid endarterectomy.  I discussed the procedural details of carotid endarterectomy with the patient.  The patient is aware that the risks of carotid endarterectomy include but are not limited to: bleeding, infection, stroke, myocardial infarction, death, cranial nerve injuries both temporary and permanent, neck hematoma, possible airway compromise, labile blood pressure post-operatively, cerebral hyperperfusion syndrome, and possible need for additional interventions in the future. The patient is aware of the risks and agrees to proceed forward with the procedure.  DESCRIPTION: After full informed written consent was obtained from the patient, the patient was brought back to the operating room and placed supine upon the operating table.  Prior to induction, the patient received IV antibiotics.  After obtaining adequate anesthesia, the patient was placed into semi-Fowler position with a shoulder roll in place and the patient's neck slightly hyperextended and rotated away from the surgical site.  The patient was prepped in the standard fashion for a left carotid endarterectomy.  I made an incision anterior to the sternocleidomastoid  muscle and dissected down through the subcutaneous tissue.  The platysmas was opened with electrocautery.  Then I dissected down to the internal jugular vein.  This was dissected posteriorly until I obtained visualization of the common carotid artery.  This artery was found more posterior than normal.  The common carotid artery was dissected out and then an umbilical tape was placed around the artery and I loosely applied a Rumel tourniquet.  I then dissected in a periadventitial fashion along the common carotid artery up to the bifurcation.  I then identified the external carotid artery and the superior thyroid artery.  A 2-0 silk tie was looped around the superior thyroid artery, and I also dissected out the external carotid artery and placed a vessel loop around it.  In continuing the dissection to the internal carotid artery, I identified the facial vein.  This was ligated and then transected, giving me improved exposure of the internal carotid artery.  In the process of this dissection, the hypoglossal nerve was identified.  I then dissected out the internal carotid artery until I identified an area of soft tissue in the internal carotid artery.  Due to the high carotid bifurcation and right angle take off the internal carotid artery, I did not think a Rumel tourniquet could be applied to the internal carotid artery.   I dissected slightly distal to relatively disease free portion of the internal carotid artery and placed a vessel loop around the artery.  At this point, we gave the patient a therapeutic bolus of Heparin intravenously (roughly 80 units/kg).  After waiting 3 minutes, then I clamped the external carotid artery and then the common carotid artery.  Using a butterfly needle connected to the arterial pressure circuit, I cannulated the common carotid artery distal to  the clamp.  The stump pressure was measured at: 75 mm Hg.  Based on this measurement, I felt no shunt was needed.  I clamped the internal  carotid artery distal the dissected segment.  I then made an arteriotomy in the common carotid artery with a 11 blade, and extended the arteriotomy with a Potts scissor down into the common carotid artery, then I carried the arteriotomy through the bifurcation into the internal carotid artery until I reached an area that was not diseased.  At this point, I started the endarterectomy in the common carotid artery with a Cytogeneticist and carried this dissection down into the common carotid artery circumferentially.  Then I transected the plaque at a segment where it was adherent.  I then carried this dissection up into the external carotid artery.  The plaque was extracted by unclamping the external carotid artery and everting the artery.  The dissection was then carried into the internal carotid artery, extracting the remaining portion of the carotid plaque.  I passed the plaque off the field as a specimen.  I then spent the next 60 minutes removing intimal flaps and loose debris.  The distal internal carotid artery intima was very friable.  I extended the arteriotomy higher and was able to extract the remaining intima, feathering out the disease in the internal carotid artery.  Eventually I reached the point where the residual plaque was densely adherent and any further dissection would compromise the integrity of the wall.  After verifying that there was no more loose intimal flaps or debris, I re-interrogated the entirety of this carotid artery.  At this point, I was satisfied that the minimal remaining disease was densely adherent to the wall and wall integrity was intact.  This common carotid artery and proximal internal carotid artery were ectatic, so I felt a patch would make the artery aneurysm.  I repaired the artery with two running stitches of 6-0 Prolene, one from each end.  Prior to completing this repair, I backbled the internal carotid artery, then the external carotid artery, and then finally  common carotid artery.  First, I released the clamp on the external carotid artery, then I released it on the common carotid artery.  After waiting a few seconds, I then released it on the internal carotid artery.  I then interrogated this patient's arteries with the continuous Doppler.  The audible waveforms in each artery were consistent with the expected characteristics for each artery.  The Sonosite probe was then sterilely draped and used to interrogate the carotid artery in both longitudinal and transverse views.  No intimal flap was visualized.  At this point, I washed out the wound, and placed thrombin and Gelfoam throughout.  I also gave the patient 30 mg of protamine to reverse his anticoagulation.   After waiting a few minutes, I removed the thrombin and Gelfoam and washed out the wound.  There was no more active bleeding in the surgical site.   I then reapproximated the platysma muscle with a running stitch of 3-0 Vicryl.  The skin was then reapproximated with a running subcuticular 4-0 Monocryl stitch.  The skin was then cleaned, dried and Dermabond was used to reinforce the skin closure.  The patient woke without any problems, neurologically intact.     COMPLICATIONS: none  CONDITION: stable  Leonides Sake, MD [facility] [place] Office: [phone] Pager: [phone]  03/05/2013, 11:23 AM

## 2013-03-05 NOTE — Anesthesia Postprocedure Evaluation (Signed)
Anesthesia Post Note  Patient: Thomas [name]  Procedure(s) Performed: Procedure(s) (LRB): ENDARTERECTOMY CAROTID (Left)  Anesthesia type: General  Patient location: PACU  Post pain: Pain level controlled  Post assessment: Patient's Cardiovascular Status Stable  Last Vitals:  Filed Vitals:   03/05/13 1225  BP: 142/76  Pulse: 69  Temp:   Resp: 14    Post vital signs: Reviewed and stable  Level of consciousness: alert  Complications: No apparent anesthesia complications

## 2013-03-05 NOTE — Progress Notes (Signed)
ANTIBIOTIC CONSULT NOTE - FOLLOW UP  Pharmacy Consult for Vancomycin Indication: post-operative prophylaxis  Allergies  Allergen Reactions  . Ace Inhibitors Hives  . Penicillins Swelling    - of the face.    Patient Measurements: Height: 5\' 10"  (177.8 cm) Weight: 192 lb 3.9 oz (87.2 kg) IBW/kg (Calculated) : 73  Vital Signs: Temp: 98.5 F (36.9 C) (07/23 1648) Temp src: Oral (07/23 1648) BP: 180/84 mmHg (07/23 1648) Pulse Rate: 85 (07/23 1648) Intake/Output from previous day:   Intake/Output from this shift: Total I/O In: 1700 [I.V.:1700] Out: 950 [Urine:900; Blood:50]  Labs:  Recent Labs  03/04/13 0850  WBC 5.8  HGB 14.4  PLT 203  CREATININE 1.33   Estimated Creatinine Clearance: 41.9 ml/min (by C-G formula based on Cr of 1.33). No results found for this basename: VANCOTROUGH, Leodis Binet, VANCORANDOM, GENTTROUGH, GENTPEAK, GENTRANDOM, TOBRATROUGH, TOBRAPEAK, TOBRARND, AMIKACINPEAK, AMIKACINTROU, AMIKACIN,  in the last 72 hours   Microbiology: Recent Results (from the past 720 hour(s))  SURGICAL PCR SCREEN     Status: Abnormal   Collection Time    03/04/13  8:51 AM      Result Value Range Status   MRSA, PCR NEGATIVE  NEGATIVE Final   Staphylococcus aureus POSITIVE (*) NEGATIVE Final   Comment:            The Xpert SA Assay (FDA     approved for NASAL specimens     in patients over 89 years of age),     is one component of     a comprehensive surveillance     program.  Test performance has     been validated by [employer] for patients greater     than or equal to 79 year old.     It is not intended     to diagnose infection nor to     guide or monitor treatment.    Anti-infectives   Start     Dose/Rate Route Frequency Ordered Stop   03/06/13 0000  vancomycin (VANCOCIN) IVPB 1000 mg/200 mL premix     1,000 mg 200 mL/hr over 60 Minutes Intravenous  Once 03/05/13 1655     03/05/13 1630  vancomycin (VANCOCIN) IVPB 1000 mg/200 mL premix  Status:   Discontinued     1,000 mg 200 mL/hr over 60 Minutes Intravenous Every 12 hours 03/05/13 1624 03/05/13 1655   03/04/13 1403  vancomycin (VANCOCIN) 1,500 mg in sodium chloride 0.9 % 500 mL IVPB     1,500 mg 250 mL/hr over 120 Minutes Intravenous 120 min pre-op 03/04/13 1403 03/05/13 0815      Assessment: 79 year old male s/p left carotid endarterectomy to continue Vancomycin for post-operative prophylaxis for 1 dose.  He has some renal insufficiency so I will adjust his dose time.  Goal of Therapy:  Vancomycin trough level 10-15 mcg/ml  Plan:  Vancomycin 1gm x 1 at midnight As no additional doses are anticipated pharmacy will sign off.  Thank you for the consult.  Estella Husk, Pharm.D., BCPS, AAHIVP Clinical Pharmacist Phone: [phone] or [phone] 03/05/2013, 4:58 PM

## 2013-03-05 NOTE — Transfer of Care (Signed)
Immediate Anesthesia Transfer of Care Note  Patient: Thomas [name]  Procedure(s) Performed: Procedure(s): ENDARTERECTOMY CAROTID (Left)  Patient Location: PACU  Anesthesia Type:General  Level of Consciousness: awake, alert  and oriented  Airway & Oxygen Therapy: Patient Spontanous Breathing and Patient connected to nasal cannula oxygen  Post-op Assessment: Report given to PACU RN and Post -op Vital signs reviewed and stable  Post vital signs: Reviewed and stable  Complications: No apparent anesthesia complications

## 2013-03-05 NOTE — OR Nursing (Signed)
Patient was neuro intact at 0810 pre-op assessment. Patient states that he had pass out spells prior to now but no other deficits.

## 2013-03-05 NOTE — H&P (View-Only) (Signed)
[name]  Established Carotid Patient  History of Present Illness  Thomas [name] is a 79 y.o. (06-05-1926) male who presents with chief complaint: L ICA stenosis.  Previous carotid studies demonstrated: RICA 1-39% stenosis, LICA 80-99% stenosis.  Pt has had no CVA or TIA sx since last visit.  He returns from his cardiology visit.  He has been deemed low risk by his cardiologist.    Past Medical History  Diagnosis Date  . Hypertension   . Allergy   . ED (erectile dysfunction)   . Cancer     prostate    Past Surgical History  Procedure Laterality Date  . Dilation of the esophagus    . Prostate surgery      Laser surgery    History   Social History  . Marital Status: Married    Spouse Name: N/A    Number of Children: N/A  . Years of Education: N/A   Occupational History  . Not on file.   Social History Main Topics  . Smoking status: Former Games developer  . Smokeless tobacco: Never Used  . Alcohol Use: No     Comment: daily--one alcohol drink a day  . Drug Use: No  . Sexually Active: Not on file   Other Topics Concern  . Not on file   Social History Narrative  . No narrative on file    Family History  Problem Relation Age of Onset  . Heart disease Father   . Cancer Mother   . Cancer Sister   . Hyperlipidemia Daughter   . Hypertension Daughter   . Hypertension Son    Current Outpatient Prescriptions on File Prior to Visit  Medication Sig Dispense Refill  . aspirin 325 MG tablet 1 po qd  30 tablet  0  . cloNIDine (CATAPRES) 0.2 MG tablet TAKE 1 TABLET (0.2 MG TOTAL) BY MOUTH 2 (TWO) TIMES DAILY.  200 tablet  4  . ibuprofen (ADVIL,MOTRIN) 200 MG tablet Take 200 mg by mouth every 6 (six) hours as needed.        . meloxicam (MOBIC) 15 MG tablet TAKE 1 TABLET (15 MG TOTAL) BY MOUTH DAILY.  30 tablet  0  . omeprazole (PRILOSEC) 40 MG capsule Take 40 mg by mouth daily.      . rosuvastatin (CRESTOR) 10 MG tablet Take 1 tablet (10 mg  total) by mouth daily.  90 tablet  3  . verapamil (CALAN-SR) 240 MG CR tablet TAKE 1 TABLET (240 MG TOTAL) BY MOUTH AT BEDTIME.  100 tablet  3   No current facility-administered medications on file prior to visit.    Allergies  Allergen Reactions  . Ace Inhibitors     REACTION: Hives  . Penicillins     REACTION: Swelling-face    REVIEW OF SYSTEMS: (Positives checked otherwise negative)  CARDIOVASCULAR: []  chest pain, []  chest pressure, []  palpitations, []  shortness of breath when laying flat, []  shortness of breath with exertion, []  pain in feet when walking, []  pain in feet when laying flat, []  history of blood clot in veins (DVT), []  history of phlebitis, []  swelling in legs, []  varicose veins  PULMONARY: []  productive cough, []  asthma, []  wheezing  NEUROLOGIC: []  weakness in arms or legs, []  numbness in arms or legs, []  difficulty speaking or slurred speech, []  temporary loss of vision in one eye, [x]  dizziness  HEMATOLOGIC: []  bleeding problems, []  problems with blood clotting too easily  MUSCULOSKEL: []  joint pain, []   joint swelling  GASTROINTEST: []  vomiting blood, []  blood in stool  GENITOURINARY: []  burning with urination, []  blood in urine  PSYCHIATRIC: []  history of major depression  INTEGUMENTARY: []  rashes, []  ulcers  CONSTITUTIONAL: []  fever, []  chills   For VQI Use Only  PRE-ADM LIVING: Home  AMB STATUS: Ambulatory  CAD Sx: None  PRIOR CHF: None  STRESS TEST: [x]  No, [ ]  Normal, [ ]  + ischemia, [ ]  + MI, [ ]  Both  Physical Examination  Filed Vitals:   02/28/13 1534 02/28/13 1536  BP: 162/85 151/82  Pulse: 72   Height: 5\' 10"  (1.778 m)   Weight: 185 lb 6.4 oz (84.097 kg)   SpO2: 95%    Body mass index is 26.6 kg/(m^2).  General: A&O x 3, WDWN  Eyes: PERRLA, EOMI  Pulmonary: Sym exp, good air movt, CTAB, no rales, rhonchi, & wheezing  Cardiac: RRR, Nl S1, S2, no Murmurs, rubs or gallops  Vascular:  Vessel  Right  Left   Radial  Palpable  Palpable    Brachial  Palpable  Palpable   Carotid  Palpable, without bruit  Palpable, without bruit   Aorta  Not palpable  N/A   Femoral  Palpable  Palpable   Popliteal  Not palpable  Not palpable   PT  Palpable  Palpable   DP  Palpable  Faintly Palpable    Gastrointestinal: soft, NTND, -G/R, - HSM, - masses, - CVAT B  Musculoskeletal: M/S 5/5 throughout , Extremities without ischemic changes   Neurologic: CN 2-12 intact , Pain and light touch intact in extremities , Motor exam as listed above  Medical Decision Making  Thomas [name] is a 79 y.o. male who presents with: asx high grade L ICA stenosis.   Based on the patient's vascular studies and examination, I have offered the patient: L CEA. I discussed with the patient the risks, benefits, and alternatives to carotid endarterectomy.   The patient is not a good candidate for carotid artery stenting due to the findings of CREST with age>77 years old.   I discussed the procedural details of carotid endarterectomy with the patient.  The patient is aware that the risks of carotid endarterectomy include but are not limited to: bleeding, infection, stroke, myocardial infarction, death, cranial nerve injuries both temporary and permanent, neck hematoma, possible airway compromise, labile blood pressure post-operatively, cerebral hyperperfusion syndrome, and possible need for additional interventions in the future.  On ultrasound, his L carotid bifurcation is found to be at the angle of the jaw, so he is at some risk for cranial nerve injury.  I discussed the implication with the patient and he understands the risks. The patient is aware of the risks and agrees to proceed forward with the procedure.  I discussed in depth with the patient the nature of atherosclerosis, and emphasized the importance of maximal medical management including strict control of blood pressure, blood glucose, and lipid levels, antiplatelet agents, obtaining regular exercise, and  cessation of smoking.  The patient is aware that without maximal medical management the underlying atherosclerotic disease process will progress, limiting the benefit of any interventions.  Thank you for allowing [place] to participate in this patient's care.  Leonides Sake, MD [facility] [place] Office: [phone] Pager: [phone]  02/28/2013, 5:19 PM

## 2013-03-05 NOTE — Interval H&P Note (Signed)
[facility] [place]  History and Physical Update  The patient was interviewed and re-examined.  The patient's previous History and Physical has been reviewed and is unchanged from my consult on: 02/28/13.  There is no change in the plan of care: L CEA.  Leonides Sake, MD [facility] [place] Office: [phone] Pager: [phone]  03/05/2013, 8:02 AM

## 2013-03-05 NOTE — Discharge Summary (Signed)
[facility] Discharge Summary   Patient ID:  Thomas [name] MRN: [id number] DOB/AGE: Jul 21, 1926 79 y.o.  Admit date: 03/05/2013 Discharge date: 03/06/13 Date of Surgery: 03/05/2013 Surgeon: Surgeon(s): Fransisco Hertz, MD  Admission Diagnosis: Left ICA STENOSIS  Discharge Diagnoses:  Left ICA STENOSIS  Secondary Diagnoses: Past Medical History  Diagnosis Date  . Hypertension   . Allergy   . ED (erectile dysfunction)   . Cancer     prostate    Procedure(s): LEFT ENDARTERECTOMY CAROTID - No Patch  Discharged Condition: good  HPI: Thomas [name] is a 79 y.o. (16-Jul-1926) male who presents with chief complaint: dysequilibrium and syncope. Previous carotid studies demonstrated: RICA 1-39% stenosis, LICA 80-99% stenosis. Patient has no history of TIA or stroke symptom. The patient has never had amaurosis fugax or monocular blindness. The patient has never had facial drooping or hemiplegia. The patient has never had receptive or expressive aphasia. The patient notes intermittent non-positional vertigo, which he interprets as dysequilibrium. He also notes passing out on 3 different occasions without reproducible triggers. The patient's risks factors for carotid disease include: HTN.  Thomas [name]  Seen in F/U with chief complaint: L ICA stenosis. Previous carotid studies demonstrated: RICA 1-39% stenosis, LICA 80-99% stenosis. Pt has had no CVA or TIA sx since last visit. He returns from his cardiology visit. He has been deemed low risk by his cardiologist for surgery.   Hospital Course:  Thomas [name] is a 79 y.o. male is S/P Left Procedure(s): ENDARTERECTOMY CAROTID Extubated: POD # 0 Physical exam: Neuro exam intact and WNL No tongue dev; no facial droop  Post-op wounds healing well Pt. Ambulating, voiding and taking PO diet without difficulty. Pt pain controlled with PO pain meds. Labs as below Complications:none  Consults:     Significant Diagnostic  Studies: CBC Lab Results  Component Value Date   WBC 5.8 03/04/2013   HGB 14.4 03/04/2013   HCT 41.7 03/04/2013   MCV 97.4 03/04/2013   PLT 203 03/04/2013    BMET    Component Value Date/Time   NA 137 03/04/2013 0850   K 3.8 03/04/2013 0850   CL 102 03/04/2013 0850   CO2 25 03/04/2013 0850   GLUCOSE 132* 03/04/2013 0850   BUN 19 03/04/2013 0850   CREATININE 1.33 03/04/2013 0850   CALCIUM 9.4 03/04/2013 0850   GFRNONAA 47* 03/04/2013 0850   GFRAA 55* 03/04/2013 0850   COAG Lab Results  Component Value Date   INR 1.11 03/04/2013     Disposition:  Discharge to :Home Discharge Orders   Future Appointments Provider Department Dept Phone   04/15/2013 9:15 AM [facility] Main Office [place]) [phone]   Future Orders Complete By Expires     CAROTID [place]: Call MD for difficulty swallowing or speaking; weakness in arms or legs that is a new symtom; severe headache.  If you have increased swelling in the neck and/or  are having difficulty breathing, CALL 911  As directed     Call MD for:  redness, tenderness, or signs of infection (pain, swelling, bleeding, redness, odor or green/yellow discharge around incision site)  As directed     Call MD for:  severe or increased pain, loss or decreased feeling  in affected limb(s)  As directed     Call MD for:  temperature >100.5  As directed     Driving Restrictions  As directed     Comments:      No driving for 2 weeks  Increase activity slowly  As directed     Comments:      Walk with assistance use walker or cane as needed    Lifting restrictions  As directed     Comments:      No lifting for 4 weeks    May shower   As directed     Scheduling Instructions:      Friday    No dressing needed  As directed     Resume previous diet  As directed     may wash over wound with mild soap and water  As directed         Medication List         aspirin 81 MG tablet  Take 1 tablet (81 mg total) by mouth daily.      cloNIDine 0.2 MG tablet  Commonly known as:  CATAPRES  Take 0.2 mg by mouth 2 (two) times daily.     clopidogrel 75 MG tablet  Commonly known as:  PLAVIX  Take 1 tablet (75 mg total) by mouth daily.     omeprazole 40 MG capsule  Commonly known as:  PRILOSEC  Take 40 mg by mouth daily.     oxyCODONE-acetaminophen 5-325 MG per tablet  Commonly known as:  ROXICET  Take 1 tablet by mouth every 4 (four) hours as needed for pain.     rosuvastatin 10 MG tablet  Commonly known as:  CRESTOR  Take 10 mg by mouth daily.     verapamil 240 MG CR tablet  Commonly known as:  CALAN-SR  Take 240 mg by mouth at bedtime.       Verbal and written Discharge instructions given to the patient. Wound care per Discharge AVS     Follow-up Information   Follow up with Nilda Simmer, MD In 2 weeks. (office will arrange-sent)    Contact information:   [address] [place] [address] [phone]       Signed: Marlowe Shores 03/05/2013, 11:39 AM  Addendum  I have independently interviewed and examined the patient, and I agree with the physician assistant's discharge summary.  This patient underwent an uneventful L CEA with primary repair.  The left internal carotid artery was found to be ectatic so I felt patching would make it aneurysmal.  The plaque was ulcerated, soft and near occluding.  The patient's hospital course was benign and he had no neck hematoma on exam POD#1.  He also was completely neurologically intact.  He will follow up in the office in two weeks.  I recommended we continue with Plavix and ASA for one month given the composition of the plaque and resulting thrombogenic surface after the endarterectomy.  Leonides Sake, MD [facility] [place] Office: [phone] Pager: [phone]  03/06/2013, 8:31 AM    --- For VQI Registry use --- Instructions: Press F2 to tab through selections.  Delete question if not applicable.   Modified Rankin  score at D/C (0-6): Rankin Score=0  IV medication needed for:  1. Hypertension: No 2. Hypotension: No  Post-op Complications: No  1. Post-op CVA or TIA: No  2. CN injury: No  3. Myocardial infarction: No  4.  CHF: No  5.  Dysrhythmia (new): No  6. Wound infection: No  7. Reperfusion symptoms: No  8. Return to OR: No   Discharge medications: Statin use:  Yes ASA use:  Yes Beta blocker use:  Yes ACE-Inhibitor use: NO P2Y12 Antagonist use: [ ]  None, [  x] Plavix - started post-op, [ ]  Plasugrel, [ ]  Ticlopinine, [ ]  Ticagrelor, [ ]  Other, [ ]  No for medical reason, [ ]  Non-compliant, [ ]  Not-indicated Anti-coagulant use:  [x ] None, [ ]  Warfarin, [ ]  Rivaroxaban, [ ]  Dabigatran, [ ]  Other, [ ]  No for medical reason, [ ]  Non-compliant, [ ]  Not-indicated

## 2013-03-05 NOTE — Anesthesia Preprocedure Evaluation (Addendum)
Anesthesia Evaluation  Patient identified by MRN, date of birth, ID band Patient awake    Reviewed: Allergy & Precautions, H&P , NPO status , Patient's Chart, lab work & pertinent test results, reviewed documented beta blocker date and time   Airway Mallampati: II TM Distance: >3 FB Neck ROM: full    Dental  (+) Poor Dentition, Missing, Loose, Chipped and Dental Advisory Given   Pulmonary asthma ,  breath sounds clear to auscultation        Cardiovascular hypertension, Pt. on medications + Peripheral Vascular Disease Rhythm:regular     Neuro/Psych TIAnegative psych ROS   GI/Hepatic Neg liver ROS, GERD-  Medicated and Controlled,  Endo/Other  negative endocrine ROS  Renal/GU negative Renal ROS  negative genitourinary   Musculoskeletal   Abdominal   Peds  Hematology negative hematology ROS (+)   Anesthesia Other Findings See surgeon's H&P   Reproductive/Obstetrics negative OB ROS                          Anesthesia Physical Anesthesia Plan  ASA: III  Anesthesia Plan: General   Post-op Pain Management:    Induction: Intravenous  Airway Management Planned: Oral ETT  Additional Equipment: Arterial line  Intra-op Plan:   Post-operative Plan: Extubation in OR  Informed Consent: I have reviewed the patients History and Physical, chart, labs and discussed the procedure including the risks, benefits and alternatives for the proposed anesthesia with the patient or authorized representative who has indicated his/her understanding and acceptance.   Dental Advisory Given  Plan Discussed with: CRNA, Surgeon and Anesthesiologist  Anesthesia Plan Comments:        Anesthesia Quick Evaluation

## 2013-03-06 ENCOUNTER — Encounter (HOSPITAL_COMMUNITY): Payer: Self-pay | Admitting: Vascular Surgery

## 2013-03-06 LAB — BASIC METABOLIC PANEL
CO2: 24 mEq/L (ref 19–32)
Calcium: 8.2 mg/dL — ABNORMAL LOW (ref 8.4–10.5)
GFR calc non Af Amer: 55 mL/min — ABNORMAL LOW (ref >90)
Potassium: 3.1 mEq/L — ABNORMAL LOW (ref 3.5–5.1)
Sodium: 135 mEq/L (ref 135–145)

## 2013-03-06 LAB — CBC
MCH: 33.2 pg (ref 26.0–34.0)
Platelets: 185 10*3/uL (ref 150–400)
RBC: 3.89 MIL/uL — ABNORMAL LOW (ref 4.22–5.81)

## 2013-03-06 NOTE — Progress Notes (Signed)
Discharge instructions given to patient's wife and daughter with [employer]back.  No complaints, discharged home with family.

## 2013-03-06 NOTE — Progress Notes (Addendum)
VASCULAR AND VEIN SURGERY POST - OP CEA PROGRESS NOTE  Date of Surgery: 03/05/2013  Surgeon(s): Fransisco Hertz, MD 1 Day Post-Op left cea .  HPI: Thomas [name] is a 79 y.o. male who is 1 Day Post-Op . Patient is doing well.  Patient denies headache; Patient denies difficulty swallowing; denies weakness in upper or lower extremities; Pt. denies other symptoms of stroke or TIA.  IMAGING: None   Significant Diagnostic Studies: CBC Lab Results  Component Value Date   WBC 7.5 03/06/2013   HGB 12.9* 03/06/2013   HCT 37.8* 03/06/2013   MCV 97.2 03/06/2013   PLT 185 03/06/2013    BMET    Component Value Date/Time   NA 135 03/06/2013 0430   K 3.1* 03/06/2013 0430   CL 103 03/06/2013 0430   CO2 24 03/06/2013 0430   GLUCOSE 125* 03/06/2013 0430   BUN 12 03/06/2013 0430   CREATININE 1.17 03/06/2013 0430   CALCIUM 8.2* 03/06/2013 0430   GFRNONAA 55* 03/06/2013 0430   GFRAA 64* 03/06/2013 0430    COAG Lab Results  Component Value Date   INR 1.11 03/04/2013   No results found for this basename: PTT      Intake/Output Summary (Last 24 hours) at 03/06/13 0753 Last data filed at 03/06/13 0630  Gross per 24 hour  Intake 3243.33 ml  Output   2200 ml  Net 1043.33 ml    Physical Exam: Filed Vitals:   03/05/13 2122 03/05/13 2355 03/06/13 0340 03/06/13 0801  BP: 134/70 142/72 126/75   Pulse: 81 77 66   Temp:  99.3 F (37.4 C) 98.6 F (37 C) 98.3 F (36.8 C)  TempSrc:  Oral Oral Oral  Resp: 22 20 17    Height:      Weight:      SpO2: 95% 94% 95%     Pt is A&O x 3 Gait is normal Speech is fluent left Neck Wound is healing well Patient with Negative tongue deviation and Negative facial droop Pt has good and equal strength in all extremities  Assessment/Plan:: Thomas [name] is a 79 y.o. male is S/P Left CEA Pt is voiding, ambulating and taking po well    Discharge to: Home Follow-up in 2 weeks   ROCZNIAK,REGINA J  03/06/2013 7:53 AM  Addendum  I have independently  interviewed and examined the patient, and I agree with the physician assistant's findings.  Pt without headache, stable hemodynamics, and intact neurologic status.  Ok to D/C home after voiding.  F/U in 2 weeks.  Leonides Sake, MD [facility] [place] Office: [phone] Pager: [phone]  03/06/2013, 8:30 AM

## 2013-03-06 NOTE — Progress Notes (Signed)
Utilization review completed.  

## 2013-03-10 ENCOUNTER — Telehealth: Payer: Self-pay

## 2013-03-10 NOTE — Telephone Encounter (Signed)
Pt's. daughter to report that pt. c/o swelling in area beneath chin.  States there is a little swelling in the incisional area, but most of the swelling is beneath chin.  Denies any redness, bruising, or discoloration of swollen area.  Reports that site of swelling is soft.  Daughter states "it looks like it has gone down from yesterday.  Denies any problems with swallowing; reports"he is eating okay."   Denies any difficulty breathing.  Reports "has had a little spotting of blood from the incision, when he lays down."   Advised that some swelling in the early post-op period is not unusual.  Enc. to continue to monitor site, and report any increased swelling/bruising, redness, inflammation, increase in tenderness, or any other concerns.  Advised pt. may apply ice to the area of swelling for short intervals.  Daughter verb. understanding.

## 2013-03-26 ENCOUNTER — Encounter: Payer: Self-pay | Admitting: Vascular Surgery

## 2013-03-27 ENCOUNTER — Encounter: Payer: Self-pay | Admitting: Vascular Surgery

## 2013-03-27 ENCOUNTER — Ambulatory Visit (INDEPENDENT_AMBULATORY_CARE_PROVIDER_SITE_OTHER): Payer: Medicare Other | Admitting: Vascular Surgery

## 2013-03-27 DIAGNOSIS — I6529 Occlusion and stenosis of unspecified carotid artery: Secondary | ICD-10-CM

## 2013-03-27 NOTE — Progress Notes (Signed)
[facility] [place]  Postoperative Visit  History of Present Illness  Thomas [name] is a 79 y.o. male who presents for postoperative follow-up for: L CEA (Date: 03/05/13).  The patient's neck incision is healed.  The patient has had no stroke or TIA symptoms.  For VQI Use Only  PRE-ADM LIVING: Home  AMB STATUS: Ambulatory  History   Social History  . Marital Status: Married    Spouse Name: N/A    Number of Children: N/A  . Years of Education: N/A   Occupational History  . Not on file.   Social History Main Topics  . Smoking status: Former Games developer  . Smokeless tobacco: Never Used     Comment: Was 40 years ago  . Alcohol Use: Yes     Comment: daily--one alcohol drink a day  . Drug Use: No  . Sexual Activity: Not on file   Other Topics Concern  . Not on file   Social History Narrative  . No narrative on file    Current Outpatient Prescriptions on File Prior to Visit  Medication Sig Dispense Refill  . aspirin 81 MG tablet Take 1 tablet (81 mg total) by mouth daily.      . cloNIDine (CATAPRES) 0.2 MG tablet Take 0.2 mg by mouth 2 (two) times daily.      . clopidogrel (PLAVIX) 75 MG tablet Take 1 tablet (75 mg total) by mouth daily.  30 tablet  0  . omeprazole (PRILOSEC) 40 MG capsule Take 40 mg by mouth daily.      Marland Kitchen oxyCODONE-acetaminophen (ROXICET) 5-325 MG per tablet Take 1 tablet by mouth every 4 (four) hours as needed for pain.  30 tablet  0  . rosuvastatin (CRESTOR) 10 MG tablet Take 10 mg by mouth daily.      . verapamil (CALAN-SR) 240 MG CR tablet Take 240 mg by mouth at bedtime.       No current facility-administered medications on file prior to visit.    Physical Examination  Filed Vitals:   03/27/13 1602  BP: 110/72  Pulse:   Temp:     L Neck: Incision is healed Neuro: CN 2-12 are intact , Motor strength is 5/5 bilaterally, sensation is grossly intact  Medical Decision Making  Jaque Dacy is a 79 y.o. male who  presents s/p L CEA, R ICA < 50%  The patient's neck incision is healed and pt with no stroke symptoms. I discussed in depth with the patient the nature of atherosclerosis, and emphasized the importance of maximal medical management including strict control of blood pressure, blood glucose, and lipid levels, obtaining regular exercise, anti-platelet use and cessation of smoking.   The patient is currently on an antiplatelet: ASA, Plavix. The patient is currently on a statin: Crestor. The patient is aware that without maximal medical management the underlying atherosclerotic disease process will progress, limiting the benefit of any interventions. The patient's surveillance will included routine carotid duplex studies which will be completed in: 6 months, at which time the patient will be re-evaluated.   I emphasized the importance of routine surveillance of the carotid arteries as recurrence of stenosis is possible, especially with proper management of underlying atherosclerotic disease. The patient agrees to participate in their maximal medical care and routine surveillance.  Thank you for allowing [place] to participate in this patient's care.  Leonides Sake, MD [facility] [place] Office: [phone] Pager: [phone]

## 2013-03-28 ENCOUNTER — Other Ambulatory Visit: Payer: Self-pay | Admitting: *Deleted

## 2013-03-28 DIAGNOSIS — Z48812 Encounter for surgical aftercare following surgery on the circulatory system: Secondary | ICD-10-CM

## 2013-04-15 ENCOUNTER — Other Ambulatory Visit (INDEPENDENT_AMBULATORY_CARE_PROVIDER_SITE_OTHER): Payer: Medicare Other

## 2013-04-15 DIAGNOSIS — I6529 Occlusion and stenosis of unspecified carotid artery: Secondary | ICD-10-CM

## 2013-04-15 LAB — HEPATIC FUNCTION PANEL
AST: 16 U/L (ref 0–37)
Albumin: 3.8 g/dL (ref 3.5–5.2)
Alkaline Phosphatase: 53 U/L (ref 39–117)
Total Protein: 7.3 g/dL (ref 6.0–8.3)

## 2013-04-15 LAB — LIPID PANEL
Cholesterol: 161 mg/dL (ref 0–200)
LDL Cholesterol: 98 mg/dL (ref 0–99)
Triglycerides: 70 mg/dL (ref 0.0–149.0)

## 2013-04-22 ENCOUNTER — Other Ambulatory Visit: Payer: Self-pay | Admitting: *Deleted

## 2013-04-22 DIAGNOSIS — E7800 Pure hypercholesterolemia, unspecified: Secondary | ICD-10-CM

## 2013-05-29 ENCOUNTER — Ambulatory Visit (INDEPENDENT_AMBULATORY_CARE_PROVIDER_SITE_OTHER): Payer: Medicare Other

## 2013-05-29 DIAGNOSIS — Z23 Encounter for immunization: Secondary | ICD-10-CM

## 2013-06-26 ENCOUNTER — Other Ambulatory Visit (INDEPENDENT_AMBULATORY_CARE_PROVIDER_SITE_OTHER): Payer: Medicare Other

## 2013-06-26 DIAGNOSIS — E7800 Pure hypercholesterolemia, unspecified: Secondary | ICD-10-CM

## 2013-07-01 ENCOUNTER — Encounter: Payer: Self-pay | Admitting: *Deleted

## 2013-07-21 ENCOUNTER — Telehealth: Payer: Self-pay | Admitting: Family Medicine

## 2013-07-21 ENCOUNTER — Encounter: Payer: Self-pay | Admitting: Family Medicine

## 2013-07-21 ENCOUNTER — Emergency Department (HOSPITAL_COMMUNITY): Payer: Medicare Other

## 2013-07-21 ENCOUNTER — Emergency Department (HOSPITAL_COMMUNITY)
Admission: EM | Admit: 2013-07-21 | Discharge: 2013-07-21 | Disposition: A | Discharge status: Home / Self Care | Payer: Medicare Other | Attending: Emergency Medicine | Admitting: Emergency Medicine

## 2013-07-21 ENCOUNTER — Ambulatory Visit (INDEPENDENT_AMBULATORY_CARE_PROVIDER_SITE_OTHER): Payer: Medicare Other | Admitting: Family Medicine

## 2013-07-21 ENCOUNTER — Encounter (HOSPITAL_COMMUNITY): Payer: Self-pay | Admitting: Emergency Medicine

## 2013-07-21 VITALS — BP 160/90 | Temp 98.3°F | Wt 184.0 lb

## 2013-07-21 DIAGNOSIS — K5000 Crohn's disease of small intestine without complications: Secondary | ICD-10-CM | POA: Insufficient documentation

## 2013-07-21 DIAGNOSIS — I129 Hypertensive chronic kidney disease with stage 1 through stage 4 chronic kidney disease, or unspecified chronic kidney disease: Secondary | ICD-10-CM | POA: Insufficient documentation

## 2013-07-21 DIAGNOSIS — N189 Chronic kidney disease, unspecified: Secondary | ICD-10-CM | POA: Insufficient documentation

## 2013-07-21 DIAGNOSIS — R109 Unspecified abdominal pain: Secondary | ICD-10-CM

## 2013-07-21 DIAGNOSIS — I498 Other specified cardiac arrhythmias: Secondary | ICD-10-CM | POA: Insufficient documentation

## 2013-07-21 DIAGNOSIS — Z7982 Long term (current) use of aspirin: Secondary | ICD-10-CM | POA: Insufficient documentation

## 2013-07-21 DIAGNOSIS — K529 Noninfective gastroenteritis and colitis, unspecified: Secondary | ICD-10-CM

## 2013-07-21 DIAGNOSIS — Z880 Allergy status to penicillin: Secondary | ICD-10-CM | POA: Insufficient documentation

## 2013-07-21 DIAGNOSIS — Z79899 Other long term (current) drug therapy: Secondary | ICD-10-CM | POA: Insufficient documentation

## 2013-07-21 DIAGNOSIS — R1031 Right lower quadrant pain: Secondary | ICD-10-CM

## 2013-07-21 DIAGNOSIS — Z87891 Personal history of nicotine dependence: Secondary | ICD-10-CM | POA: Insufficient documentation

## 2013-07-21 DIAGNOSIS — E876 Hypokalemia: Secondary | ICD-10-CM | POA: Insufficient documentation

## 2013-07-21 LAB — CBC WITH DIFFERENTIAL/PLATELET
Eosinophils Relative: 3 % (ref 0–5)
Hemoglobin: 12 g/dL — ABNORMAL LOW (ref 13.0–17.0)
Lymphocytes Relative: 17 % (ref 12–46)
Lymphs Abs: 1 10*3/uL (ref 0.7–4.0)
MCV: 95.8 fL (ref 78.0–100.0)
Monocytes Relative: 20 % — ABNORMAL HIGH (ref 3–12)
Neutro Abs: 3.5 10*3/uL (ref 1.7–7.7)
Platelets: 172 10*3/uL (ref 150–400)
RBC: 3.81 MIL/uL — ABNORMAL LOW (ref 4.22–5.81)
RDW: 13.5 % (ref 11.5–15.5)
WBC: 5.8 10*3/uL (ref 4.0–10.5)

## 2013-07-21 LAB — POCT URINALYSIS DIPSTICK
Bilirubin, UA: NEGATIVE
Glucose, UA: NEGATIVE
Ketones, UA: NEGATIVE
Nitrite, UA: NEGATIVE
Protein, UA: 30
Spec Grav, UA: 1.02
Urobilinogen, UA: 1

## 2013-07-21 LAB — URINALYSIS, ROUTINE W REFLEX MICROSCOPIC
Hgb urine dipstick: NEGATIVE
Ketones, ur: NEGATIVE mg/dL
Leukocytes, UA: NEGATIVE
Nitrite: NEGATIVE
Protein, ur: NEGATIVE mg/dL
Urobilinogen, UA: 1 mg/dL (ref 0.0–1.0)
pH: 5.5 (ref 5.0–8.0)

## 2013-07-21 LAB — LIPASE, BLOOD: Lipase: 13 U/L (ref 11–59)

## 2013-07-21 LAB — COMPREHENSIVE METABOLIC PANEL
ALT: 10 U/L (ref 0–53)
Alkaline Phosphatase: 58 U/L (ref 39–117)
CO2: 22 mEq/L (ref 19–32)
GFR calc Af Amer: 56 mL/min — ABNORMAL LOW (ref >90)
GFR calc non Af Amer: 48 mL/min — ABNORMAL LOW (ref >90)
Glucose, Bld: 100 mg/dL — ABNORMAL HIGH (ref 70–99)
Potassium: 3.2 mEq/L — ABNORMAL LOW (ref 3.5–5.1)
Sodium: 136 mEq/L (ref 135–145)

## 2013-07-21 MED ORDER — CIPROFLOXACIN HCL 500 MG PO TABS: 500.0000 mg | ORAL_TABLET | Freq: Two times a day (BID) | ORAL | Status: DC

## 2013-07-21 MED ORDER — POTASSIUM CHLORIDE CRYS ER 20 MEQ PO TBCR
40.0000 meq | EXTENDED_RELEASE_TABLET | Freq: Once | ORAL | Status: AC
  Administered 2013-07-21: 40 meq via ORAL
  Filled 2013-07-21: qty 2

## 2013-07-21 MED ORDER — CIPROFLOXACIN HCL 500 MG PO TABS
500.0000 mg | ORAL_TABLET | Freq: Once | ORAL | Status: AC
  Administered 2013-07-21: 500 mg via ORAL
  Filled 2013-07-21: qty 1

## 2013-07-21 MED ORDER — IOHEXOL 300 MG/ML  SOLN
100.0000 mL | Freq: Once | INTRAMUSCULAR | Status: AC | PRN
  Administered 2013-07-21: 100 mL via INTRAVENOUS

## 2013-07-21 MED ORDER — IOHEXOL 300 MG/ML  SOLN
50.0000 mL | Freq: Once | INTRAMUSCULAR | Status: AC | PRN
  Administered 2013-07-21: 50 mL via ORAL

## 2013-07-21 MED ORDER — METRONIDAZOLE 500 MG PO TABS
500.0000 mg | ORAL_TABLET | Freq: Once | ORAL | Status: AC
  Administered 2013-07-21: 500 mg via ORAL
  Filled 2013-07-21: qty 1

## 2013-07-21 MED ORDER — METRONIDAZOLE 500 MG PO TABS: 500.0000 mg | ORAL_TABLET | Freq: Two times a day (BID) | ORAL | Status: DC

## 2013-07-21 NOTE — Progress Notes (Signed)
   Subjective:    Patient ID: Thomas [name], male    DOB: [birth year], 79 y.o.   MRN: [id number]  HPI [name] is a 79 year old male who comes in today accompanied by his wife and daughter for evaluation of abdominal pain  He states he felt well until last Friday when he began having cramps in the right lower corner. He says the cramping went on for about 6 hours and stopped and then he developed severe pain. Since that time the pain has been fairly constant. He's had no fever chills nausea vomiting or diarrhea and deep she's been eating okay. He said no urinary tract symptoms. He's never had abdominal surgery.   Review of Systems GI GU review of systems otherwise negative    Objective:   Physical Exam Well-developed well nourished male no acute distress examination the abdomen shows the abdomen is soft the bowel sounds are normal there is tenderness right lower quadrant question rebound no palpable masses       Assessment & Plan:  Right lower quadrant abdominal pain question appendicitis....... to the ER for workup

## 2013-07-21 NOTE — Patient Instructions (Signed)
Go directly to the emergency room for evaluation and concerned it may be appendicitis

## 2013-07-21 NOTE — ED Notes (Signed)
Pt reports RLQ since Friday. Sent by PCP for possible appendicitis. Denies n/v/d.

## 2013-07-21 NOTE — Telephone Encounter (Signed)
Please schedule patient at 4 pm today.  Daughter is aware.

## 2013-07-21 NOTE — ED Provider Notes (Addendum)
CSN: [id number]     Arrival date & time 07/21/13  1720 History   First MD Initiated Contact with Patient 07/21/13 1743     Chief Complaint  Patient presents with  . Abdominal Pain   (Consider location/radiation/quality/duration/timing/severity/associated sxs/prior Treatment) HPI Complains of right lower quadrant pain onset 3 days ago pain is nonradiating worse with changing position or walking improved with remaining still. He treated himself with oxycodone one tablet 9 AM today. No vomiting no nausea no anorexia last bowel movement today, normal no urinary symptoms no other associated symptoms. He has no pain presently as he is lying still. Past Medical History  Diagnosis Date  . Hypertension   . Allergy   . ED (erectile dysfunction)   . Cancer     prostate   prostate cancer Past Surgical History  Procedure Laterality Date  . Dilation of the esophagus    . Prostate surgery      Laser surgery  . Endarterectomy Left 03/05/2013    Procedure: ENDARTERECTOMY CAROTID;  Surgeon: Fransisco Hertz, MD;  Location: [facility] OR;  Service: Vascular;  Laterality: Left;   Family History  Problem Relation Age of Onset  . Heart disease Father   . Cancer Mother   . Cancer Sister   . Hyperlipidemia Daughter   . Hypertension Daughter   . Hypertension Son    History  Substance Use Topics  . Smoking status: Former Games developer  . Smokeless tobacco: Never Used     Comment: Was 40 years ago  . Alcohol Use: Yes     Comment: daily--one alcohol drink a day    Review of Systems  Constitutional: Negative.   HENT: Negative.   Respiratory: Negative.   Cardiovascular: Negative.   Gastrointestinal: Positive for abdominal pain.  Musculoskeletal: Negative.   Skin: Negative.   Neurological: Negative.   Psychiatric/Behavioral: Negative.   All other systems reviewed and are negative.    Allergies  Ace inhibitors and Penicillins  Home Medications   Current Outpatient Rx  Name  Route  Sig  Dispense  Refill   . aspirin 81 MG tablet   Oral   Take 1 tablet (81 mg total) by mouth daily.         . cloNIDine (CATAPRES) 0.2 MG tablet   Oral   Take 0.2 mg by mouth 2 (two) times daily.         . clopidogrel (PLAVIX) 75 MG tablet   Oral   Take 1 tablet (75 mg total) by mouth daily.   30 tablet   0   . omeprazole (PRILOSEC) 40 MG capsule   Oral   Take 40 mg by mouth daily.         Marland Kitchen oxyCODONE-acetaminophen (ROXICET) 5-325 MG per tablet   Oral   Take 1 tablet by mouth every 4 (four) hours as needed for pain.   30 tablet   0   . rosuvastatin (CRESTOR) 20 MG tablet   Oral   Take 1 tablet (20 mg total) by mouth daily.         . verapamil (CALAN-SR) 240 MG CR tablet   Oral   Take 240 mg by mouth at bedtime.          BP 150/76  Pulse 52  Temp(Src) 98 F (36.7 C) (Oral)  Resp 16  SpO2 95% Physical Exam  Nursing note and vitals reviewed. Constitutional: He appears well-developed and well-nourished.  HENT:  Head: Normocephalic and atraumatic.  Eyes: Conjunctivae are normal. Pupils  are equal, round, and reactive to light.  Neck: Neck supple. No tracheal deviation present. No thyromegaly present.  Cardiovascular: Regular rhythm.   No murmur heard. Mildly bradycardic  Pulmonary/Chest: Effort normal and breath sounds normal.  Abdominal: Soft. Bowel sounds are normal. He exhibits no distension and no mass. There is tenderness. There is no rebound and no guarding.  Tender right lower quadrant  Genitourinary: Penis normal.  No signs of hernia  Musculoskeletal: Normal range of motion. He exhibits no edema and no tenderness.  Neurological: He is alert. Coordination normal.  Skin: Skin is warm and dry. No rash noted.  Psychiatric: He has a normal mood and affect.    ED Course  Procedures (including critical care time) Labs Review Labs Reviewed  CBC WITH DIFFERENTIAL  COMPREHENSIVE METABOLIC PANEL  LIPASE, BLOOD   Imaging Review No results found.  EKG Interpretation    None      850 p.m. patient resting comfortably. No distress. Results for orders placed during the hospital encounter of 07/21/13  CBC WITH DIFFERENTIAL      Result Value Range   WBC 5.8  4.0 - 10.5 K/uL   RBC 3.81 (*) 4.22 - 5.81 MIL/uL   Hemoglobin 12.0 (*) 13.0 - 17.0 g/dL   HCT 09.8 (*) 11.9 - 14.7 %   MCV 95.8  78.0 - 100.0 fL   MCH 31.5  26.0 - 34.0 pg   MCHC 32.9  30.0 - 36.0 g/dL   RDW 82.9  56.2 - 13.0 %   Platelets 172  150 - 400 K/uL   Neutrophils Relative % 61  43 - 77 %   Neutro Abs 3.5  1.7 - 7.7 K/uL   Lymphocytes Relative 17  12 - 46 %   Lymphs Abs 1.0  0.7 - 4.0 K/uL   Monocytes Relative 20 (*) 3 - 12 %   Monocytes Absolute 1.1 (*) 0.1 - 1.0 K/uL   Eosinophils Relative 3  0 - 5 %   Eosinophils Absolute 0.2  0.0 - 0.7 K/uL   Basophils Relative 0  0 - 1 %   Basophils Absolute 0.0  0.0 - 0.1 K/uL  COMPREHENSIVE METABOLIC PANEL      Result Value Range   Sodium 136  135 - 145 mEq/L   Potassium 3.2 (*) 3.5 - 5.1 mEq/L   Chloride 99  96 - 112 mEq/L   CO2 22  19 - 32 mEq/L   Glucose, Bld 100 (*) 70 - 99 mg/dL   BUN 18  6 - 23 mg/dL   Creatinine, Ser 8.65  0.50 - 1.35 mg/dL   Calcium 8.9  8.4 - 78.4 mg/dL   Total Protein 7.2  6.0 - 8.3 g/dL   Albumin 3.3 (*) 3.5 - 5.2 g/dL   AST 13  0 - 37 U/L   ALT 10  0 - 53 U/L   Alkaline Phosphatase 58  39 - 117 U/L   Total Bilirubin 0.6  0.3 - 1.2 mg/dL   GFR calc non Af Amer 48 (*) >90 mL/min   GFR calc Af Amer 56 (*) >90 mL/min  LIPASE, BLOOD      Result Value Range   Lipase 13  11 - 59 U/L  URINALYSIS, ROUTINE W REFLEX MICROSCOPIC      Result Value Range   Color, Urine YELLOW  YELLOW   APPearance CLEAR  CLEAR   Specific Gravity, Urine 1.021  1.005 - 1.030   pH 5.5  5.0 - 8.0  Glucose, UA NEGATIVE  NEGATIVE mg/dL   Hgb urine dipstick NEGATIVE  NEGATIVE   Bilirubin Urine NEGATIVE  NEGATIVE   Ketones, ur NEGATIVE  NEGATIVE mg/dL   Protein, ur NEGATIVE  NEGATIVE mg/dL   Urobilinogen, UA 1.0  0.0 - 1.0 mg/dL    Nitrite NEGATIVE  NEGATIVE   Leukocytes, UA NEGATIVE  NEGATIVE   Ct Abdomen Pelvis W Contrast  07/21/2013   CLINICAL DATA:  Appendicitis. Right lower quadrant pain. History prostate cancer.  EXAM: CT ABDOMEN AND PELVIS WITH CONTRAST  TECHNIQUE: Multidetector CT imaging of the abdomen and pelvis was performed using the standard protocol following bolus administration of intravenous contrast.  CONTRAST:  50mL OMNIPAQUE IOHEXOL 300 MG/ML SOLN, OMNIPAQUE IOHEXOL 300 MG/ML SOLN  COMPARISON:  CT 08/04/2009.  FINDINGS: Lung Bases: Dependent atelectasis. Nodular atelectasis with unchanged pleural calcification in the posterior left lower lobe. Coronary artery atherosclerosis is present. If office based assessment of coronary risk factors has not been performed, it is now recommended. Mitral annular calcification is present.  Liver: Tiny cyst in segment 4A of the liver appears unchanged. No other lesions are identified.  Spleen:  Unchanged cyst in the posterior spleen.  Gallbladder:  Contracted.  No calcified stones.  Common bile duct:  Normal.  Pancreas:  Normal.  Adrenal glands:  Normal.  Kidneys: Mild renal atrophy. No calculi or hydronephrosis. Normal enhancement. Normal delayed excretion of contrast from the kidneys. Both ureters appear within normal limits.  Stomach:  Decompressed.  Small bowel: Duodenum appears normal. No mesenteric adenopathy. Proximal small bowel appears normal. Mural edema and inflammatory changes present in the terminal ileum, near the ileocecal valve. This is new compared to the prior CT. The mural thickening is circumferential and there is mesenteric fat stranding nearby. No obstruction or bowel dilation.  There is an ingested bone fragment in the terminal ileum however there are no associated nearby inflammatory changes (image 52 series 4). No intra-abdominal free air or mesenteric abscess identified.  Colon: Normal appendix. Fluid is present within the cecum, nonspecific. Severe  colonic diverticulosis. No inflammatory changes of colon.  Pelvic Genitourinary:  Urinary bladder appears normal.  Bones: SI joint degenerative disease. No aggressive osseous lesions.  Vasculature: Infrarenal abdominal aortic ectasia.  No aneurysm.  Body Wall: Fat containing periumbilical hernia. Fat containing bilateral inguinal hernias.  IMPRESSION: 1. Inflammatory changes of the ileum, without a definite cause identified. The differential considerations included enteric infection, inflammatory bowel disease or less likely ischemia. Superior mesenteric artery appears opacified. Inflammatory change associated with ingested bone fragment is also in the differential considerations. No intra-abdominal free air or abscess. 2. Ectatic abdominal aorta at risk for aneurysm development. Recommend follow up by [place] in 5 years. This recommendation follows ACR consensus guidelines: White Paper of the ACR Incidental Findings Committee II on Vascular Findings. J Am Coll Radiol 2013; 10:789-794. 3. Unchanged hepatic and splenic cysts. 4. Normal appendix.   Electronically Signed   By: Andreas Newport M.D.   On: 07/21/2013 20:09     MDM  No diagnosis found. cipro dose discussed with pharmacist Doubt ischemic colitis. No pain out of proprtion to ternderness after 3 days sx. No leukocytosis Plan rx cipro , flagyl,potassium relplacement in the ED. F/u Dr. Tawanna Cooler next week. Pt has oxycodone at home which he can take for pain Dx #1 ileitis #2 hypokalemia #3 renal insufficiency    Doug Sou, MD 07/21/13 2102  Doug Sou, MD 07/21/13 2103

## 2013-07-21 NOTE — Progress Notes (Signed)
Pre visit review using our clinic review tool, if applicable. No additional management support is needed unless otherwise documented below in the visit note. 

## 2013-07-21 NOTE — Telephone Encounter (Signed)
Pt has abdominal pain. Pt has been eating needs and daughter thinks he is not supposed to. Prefers to see dr tod. Pt in a lot of pain. pls advise

## 2013-07-21 NOTE — Telephone Encounter (Signed)
done

## 2013-07-24 ENCOUNTER — Telehealth: Payer: Self-pay | Admitting: Family Medicine

## 2013-07-24 NOTE — Telephone Encounter (Signed)
Pt went to ED, had intestinal infection. Treated.  Instructed to fu w/ pcp in 7 days. pls advise. Not on 12/17 or 18, and after 3pm.

## 2013-07-24 NOTE — Telephone Encounter (Signed)
Spoke with daughter and and an appointment made

## 2013-08-02 ENCOUNTER — Encounter: Payer: Self-pay | Admitting: Internal Medicine

## 2013-08-02 ENCOUNTER — Ambulatory Visit (INDEPENDENT_AMBULATORY_CARE_PROVIDER_SITE_OTHER): Payer: Medicare Other | Admitting: Internal Medicine

## 2013-08-02 VITALS — BP 158/100 | HR 97 | Temp 99.4°F | Wt 180.4 lb

## 2013-08-02 DIAGNOSIS — K529 Noninfective gastroenteritis and colitis, unspecified: Secondary | ICD-10-CM

## 2013-08-02 DIAGNOSIS — R112 Nausea with vomiting, unspecified: Secondary | ICD-10-CM

## 2013-08-02 DIAGNOSIS — K5289 Other specified noninfective gastroenteritis and colitis: Secondary | ICD-10-CM

## 2013-08-02 MED ORDER — METRONIDAZOLE 500 MG PO TABS: 500.0000 mg | ORAL_TABLET | Freq: Two times a day (BID) | ORAL | Status: DC

## 2013-08-02 MED ORDER — CIPROFLOXACIN HCL 500 MG PO TABS: 500.0000 mg | ORAL_TABLET | Freq: Two times a day (BID) | ORAL | Status: DC

## 2013-08-02 MED ORDER — ONDANSETRON HCL 4 MG PO TABS: 4.0000 mg | ORAL_TABLET | Freq: Three times a day (TID) | ORAL | Status: DC | PRN

## 2013-08-02 MED ORDER — ONDANSETRON HCL 4 MG/2ML IJ SOLN
8.0000 mg | Freq: Once | INTRAMUSCULAR | Status: AC
  Administered 2013-08-02: 8 mg via INTRAVENOUS

## 2013-08-02 NOTE — Patient Instructions (Addendum)
It was good to see you today.  We have reviewed your prior records including labs and tests today  Injection of Zofran given to you in office today to help control nausea and vomiting symptoms  Begin antibiotics Cipro and Flagyl for the next 10 days, also Zofran to use at home if needed for nausea and vomiting  Your prescription(s) have been submitted to your pharmacy. Please take as directed and contact our office if you believe you are having problem(s) with the medication(s).  Other medications reviewed and updated, no other changes  We'll make referral to gastroenterology specialist to evaluate your recurrent ileitis symptoms. I office will call regarding this appointment once scheduled  if your symptoms continue to worsen (pain, fever, etc), or if you are unable take anything by mouth (pills, fluids, etc), you should go to the emergency room for further evaluation and treatment.  Nausea and Vomiting Nausea is a sick feeling that often comes before throwing up (vomiting). Vomiting is a reflex where stomach contents come out of your mouth. Vomiting can cause severe loss of body fluids (dehydration). Children and elderly adults can become dehydrated quickly, especially if they also have diarrhea. Nausea and vomiting are symptoms of a condition or disease. It is important to find the cause of your symptoms. CAUSES   Direct irritation of the stomach lining. This irritation can result from increased acid production (gastroesophageal reflux disease), infection, food poisoning, taking certain medicines (such as nonsteroidal anti-inflammatory drugs), alcohol use, or tobacco use.  Signals from the brain.These signals could be caused by a headache, heat exposure, an inner ear disturbance, increased pressure in the brain from injury, infection, a tumor, or a concussion, pain, emotional stimulus, or metabolic problems.  An obstruction in the gastrointestinal tract (bowel obstruction).  Illnesses  such as diabetes, hepatitis, gallbladder problems, appendicitis, kidney problems, cancer, sepsis, atypical symptoms of a heart attack, or eating disorders.  Medical treatments such as chemotherapy and radiation.  Receiving medicine that makes you sleep (general anesthetic) during surgery. DIAGNOSIS Your caregiver may ask for tests to be done if the problems do not improve after a few days. Tests may also be done if symptoms are severe or if the reason for the nausea and vomiting is not clear. Tests may include:  Urine tests.  Blood tests.  Stool tests.  Cultures (to look for evidence of infection).  X-rays or other imaging studies. Test results can help your caregiver make decisions about treatment or the need for additional tests. TREATMENT You need to stay well hydrated. Drink frequently but in small amounts.You may wish to drink water, sports drinks, clear broth, or eat frozen ice pops or gelatin dessert to help stay hydrated.When you eat, eating slowly may help prevent nausea.There are also some antinausea medicines that may help prevent nausea. HOME CARE INSTRUCTIONS   Take all medicine as directed by your caregiver.  If you do not have an appetite, do not force yourself to eat. However, you must continue to drink fluids.  If you have an appetite, eat a normal diet unless your caregiver tells you differently.  Eat a variety of complex carbohydrates (rice, wheat, potatoes, bread), lean meats, yogurt, fruits, and vegetables.  Avoid high-fat foods because they are more difficult to digest.  Drink enough water and fluids to keep your urine clear or pale yellow.  If you are dehydrated, ask your caregiver for specific rehydration instructions. Signs of dehydration may include:  Severe thirst.  Dry lips and mouth.  Dizziness.  Dark urine.  Decreasing urine frequency and amount.  Confusion.  Rapid breathing or pulse. SEEK IMMEDIATE MEDICAL CARE IF:   You have blood  or brown flecks (like coffee grounds) in your vomit.  You have black or bloody stools.  You have a severe headache or stiff neck.  You are confused.  You have severe abdominal pain.  You have chest pain or trouble breathing.  You do not urinate at least once every 8 hours.  You develop cold or clammy skin.  You continue to vomit for longer than 24 to 48 hours.  You have a fever. MAKE SURE YOU:   Understand these instructions.  Will watch your condition.  Will get help right away if you are not doing well or get worse. Document Released: 07/31/2005 Document Revised: 10/23/2011 Document Reviewed: 12/28/2010 [facility] Patient Information 2014 [place], [place].

## 2013-08-02 NOTE — Progress Notes (Signed)
Pre-visit discussion using our clinic review tool. No additional management support is needed unless otherwise documented below in the visit note.  

## 2013-08-02 NOTE — Progress Notes (Signed)
Subjective:    Patient ID: Thomas [name], male    DOB: April 13, 1926, 80 y.o.   MRN: [id number]  Emesis  This is a recurrent problem. The current episode started today (5 AM). Episode frequency: every 30 min. The problem has been waxing and waning. The emesis has an appearance of bile. There has been no fever. Pertinent negatives include no abdominal pain, chest pain, chills, coughing, diarrhea, dizziness, fever or headaches. Risk factors: recent ER visit for same 07/21/13: ilieitis on CT. He has tried nothing for the symptoms.    Past Medical History  Diagnosis Date  . Hypertension   . Allergy   . ED (erectile dysfunction)   . Cancer     prostate    Review of Systems  Constitutional: Negative for fever and chills.  Respiratory: Negative for cough.   Cardiovascular: Negative for chest pain.  Gastrointestinal: Positive for vomiting. Negative for abdominal pain and diarrhea.  Neurological: Negative for dizziness and headaches.       Objective:   Physical Exam BP 158/100  Pulse 97  Temp(Src) 99.4 F (37.4 C) (Oral)  Wt 180 lb 6.4 oz (81.829 kg)  SpO2 98% Wt Readings from Last 3 Encounters:  08/02/13 180 lb 6.4 oz (81.829 kg)  07/21/13 184 lb (83.462 kg)  03/27/13 180 lb 8 oz (81.874 kg)   Constitutional: he appears well-developed and well-nourished. No distress. Dtr Jasmine December at side Cardiovascular: Normal rate, regular rhythm and normal heart sounds.  No murmur heard. No BLE edema. Pulmonary/Chest: Effort normal and breath sounds normal. No respiratory distress. he has no wheezes.  Abdominal: Firm but NTND and +BS. Bowel sounds are normal. he exhibits no distension. There is no tenderness. no masses  Psychiatric: he has a normal mood and affect. behavior is normal. Judgment and thought content normal.  Lab Results  Component Value Date   WBC 5.8 07/21/2013   HGB 12.0* 07/21/2013   HCT 36.5* 07/21/2013   PLT 172 07/21/2013   GLUCOSE 100* 07/21/2013   CHOL 154 06/26/2013   TRIG 98.0 06/26/2013   HDL 46.00 06/26/2013   LDLDIRECT 175.1 05/10/2012   LDLCALC 88 06/26/2013   ALT 10 07/21/2013   AST 13 07/21/2013   NA 136 07/21/2013   K 3.2* 07/21/2013   CL 99 07/21/2013   CREATININE 1.30 07/21/2013   BUN 18 07/21/2013   CO2 22 07/21/2013   TSH 1.65 02/17/2013   PSA 1.72 05/10/2012   INR 1.11 03/04/2013   HGBA1C 6.7* 10/12/2011   Ct Abdomen Pelvis W Contrast  07/21/2013   CLINICAL DATA:  Appendicitis. Right lower quadrant pain. History prostate cancer.  EXAM: CT ABDOMEN AND PELVIS WITH CONTRAST  TECHNIQUE: Multidetector CT imaging of the abdomen and pelvis was performed using the standard protocol following bolus administration of intravenous contrast.  CONTRAST:  50mL OMNIPAQUE IOHEXOL 300 MG/ML SOLN, OMNIPAQUE IOHEXOL 300 MG/ML SOLN  COMPARISON:  CT 08/04/2009.  FINDINGS: Lung Bases: Dependent atelectasis. Nodular atelectasis with unchanged pleural calcification in the posterior left lower lobe. Coronary artery atherosclerosis is present. If office based assessment of coronary risk factors has not been performed, it is now recommended. Mitral annular calcification is present.  Liver: Tiny cyst in segment 4A of the liver appears unchanged. No other lesions are identified.  Spleen:  Unchanged cyst in the posterior spleen.  Gallbladder:  Contracted.  No calcified stones.  Common bile duct:  Normal.  Pancreas:  Normal.  Adrenal glands:  Normal.  Kidneys: Mild renal atrophy. No calculi  or hydronephrosis. Normal enhancement. Normal delayed excretion of contrast from the kidneys. Both ureters appear within normal limits.  Stomach:  Decompressed.  Small bowel: Duodenum appears normal. No mesenteric adenopathy. Proximal small bowel appears normal. Mural edema and inflammatory changes present in the terminal ileum, near the ileocecal valve. This is new compared to the prior CT. The mural thickening is circumferential and there is mesenteric fat stranding nearby. No obstruction or bowel  dilation.  There is an ingested bone fragment in the terminal ileum however there are no associated nearby inflammatory changes (image 52 series 4). No intra-abdominal free air or mesenteric abscess identified.  Colon: Normal appendix. Fluid is present within the cecum, nonspecific. Severe colonic diverticulosis. No inflammatory changes of colon.  Pelvic Genitourinary:  Urinary bladder appears normal.  Bones: SI joint degenerative disease. No aggressive osseous lesions.  Vasculature: Infrarenal abdominal aortic ectasia.  No aneurysm.  Body Wall: Fat containing periumbilical hernia. Fat containing bilateral inguinal hernias.  IMPRESSION: 1. Inflammatory changes of the ileum, without a definite cause identified. The differential considerations included enteric infection, inflammatory bowel disease or less likely ischemia. Superior mesenteric artery appears opacified. Inflammatory change associated with ingested bone fragment is also in the differential considerations. No intra-abdominal free air or abscess. 2. Ectatic abdominal aorta at risk for aneurysm development. Recommend follow up by [place] in 5 years. This recommendation follows ACR consensus guidelines: White Paper of the ACR Incidental Findings Committee II on Vascular Findings. J Am Coll Radiol 2013; 10:789-794. 3. Unchanged hepatic and splenic cysts. 4. Normal appendix.   Electronically Signed   By: Andreas Newport M.D.   On: 07/21/2013 20:09       Assessment & Plan:    Nausea and vomiting, every 30 minutes  Intractable symptoms over past 6 hours  Recent episode similar symptoms less than 2 weeks ago, ER evaluation for same reviewed - diagnosed with ileitis based on CT scans, symptoms improved with Cipro and Flagyl course until this AM  IM Zofran 8 mg given today  Prescription to resume Cipro and Flagyl for 10 days and by mouth Zofran as needed - erx done  Refer to GI to evaluate recurrent ileitis symptoms, ?etiology  Followup with primary as  planned January 6  Patient and family understand good emergency room if symptoms worse or unimproved and if unable to stay hydrated between now and outpatient evaluation of same

## 2013-08-04 ENCOUNTER — Telehealth: Payer: Self-pay | Admitting: Family Medicine

## 2013-08-04 ENCOUNTER — Encounter: Payer: Self-pay | Admitting: Gastroenterology

## 2013-08-04 ENCOUNTER — Telehealth: Payer: Self-pay | Admitting: *Deleted

## 2013-08-04 NOTE — Telephone Encounter (Signed)
Calling to check on gastroenterology Appt - given from list of his Appts.

## 2013-08-04 NOTE — Telephone Encounter (Signed)
[facility] Triage Call Report Triage Record Num: [id number] Operator: Caswell Corwin Patient Name: Thomas [name] Call Date & Time: 08/02/2013 1:57:52PM Patient Phone: [phone] PCP: Rene Paci Patient Gender: Male PCP Fax : [phone] Patient DOB: [birth year] Practice Name: [facility] Reason for Call: Caller: [name]/Other; PCP: Rene Paci (Adults only); CB#: [phone]; Call regarding Medication issue, pts Rx not called in. He was just seen by Dr. Marquette Old not at pharmacy. Callled [facility] at [phone] and RX's have just come through. Protocol(s) Used: Medication Questions - Adult Recommended Outcome per Protocol: Call Provider within 4 Hours Reason for Outcome: Prescription ordered today and not available at pharmacy putting patient at clinical risk Care Advice: ~ 12/

## 2013-08-04 NOTE — Telephone Encounter (Signed)
Looks like can pool told him

## 2013-08-05 ENCOUNTER — Other Ambulatory Visit: Payer: Self-pay | Admitting: *Deleted

## 2013-08-05 NOTE — Telephone Encounter (Signed)
Received fax pt needing PA on his generic zofran. Completed PA on cover-my-meds waiting on approval status...Raechel Chute

## 2013-08-12 NOTE — Telephone Encounter (Signed)
Never received PA back with approval status. Check on PA on cover my meds it states PA has been cancel. Pt is not under insurance plan...Thomas [name]

## 2013-08-15 ENCOUNTER — Encounter: Payer: Self-pay | Admitting: *Deleted

## 2013-08-19 ENCOUNTER — Ambulatory Visit (INDEPENDENT_AMBULATORY_CARE_PROVIDER_SITE_OTHER): Payer: Medicare Other | Admitting: Family Medicine

## 2013-08-19 ENCOUNTER — Encounter: Payer: Self-pay | Admitting: Family Medicine

## 2013-08-19 VITALS — BP 104/70 | HR 58 | Temp 98.1°F | Wt 178.0 lb

## 2013-08-19 DIAGNOSIS — I10 Essential (primary) hypertension: Secondary | ICD-10-CM

## 2013-08-19 NOTE — Patient Instructions (Signed)
Hold the clonidine and the verapamil  Rest at home today and tomorrow  Check his blood pressure 3 times daily  Return on Thursday for followup with a record of all his blood pressure readings and the device  Also bring all the pill bottles

## 2013-08-19 NOTE — Progress Notes (Signed)
Pre visit review using our clinic review tool, if applicable. No additional management support is needed unless otherwise documented below in the visit note. 

## 2013-08-19 NOTE — Progress Notes (Signed)
   Subjective:    Patient ID: Thomas [name], male    DOB: 1926-04-01, 80 y.o.   MRN: [id number]  HPI Thomas [name] is an 80 year old male who comes in today accompanied by his daughter following a hospitalization for abdominal pain nausea and vomiting. This is his third episode. He's due for a GI consult with Dr. Sharlett Iles on Friday for further evaluation.  His blood pressure is 104/70 pulse 70 and regular on clonidine 0.2 twice a day verapamil 240 daily  After reviewing his medical history including his hospitalization and treatment and what to dictate a note. He had a near syncopal episode. We immediately put his head down feet up in the air and he came to rather quickly. Pulse is 70 and regular BP still low 104/70   Review of Systems    review of systems otherwise negative Objective:   Physical Exam Well-developed well-nourished male no acute distress vital signs stable he is afebrile except for BP too low       Assessment & Plan:  Rest at home  Hold the clonidine and verapamil  Check his blood pressure 3 times daily  Return on Thursday for followup with a record of all his blood pressure readings and the device  Also bring all the pill bottles

## 2013-08-21 ENCOUNTER — Ambulatory Visit (INDEPENDENT_AMBULATORY_CARE_PROVIDER_SITE_OTHER): Payer: Medicare Other | Admitting: Family Medicine

## 2013-08-21 ENCOUNTER — Encounter: Payer: Self-pay | Admitting: Family Medicine

## 2013-08-21 VITALS — BP 180/90 | Temp 99.8°F | Wt 181.0 lb

## 2013-08-21 DIAGNOSIS — I10 Essential (primary) hypertension: Secondary | ICD-10-CM

## 2013-08-21 MED ORDER — CLONIDINE HCL 0.2 MG PO TABS: ORAL_TABLET | ORAL | Status: DC

## 2013-08-21 NOTE — Telephone Encounter (Signed)
Pt was seen today and had to schedule a follow-up for BP check and his daughter wants someone to call her if there's a cancellation because she wanted to bring him to appt but she doesn't get off work until after 3:30 p.m. Her name is Patriciaann Clan and she is on the [facility] Please call her at [phone] if someone cancels.

## 2013-08-21 NOTE — Patient Instructions (Signed)
...   Restart the clonidine 0.2 mg........... one tablet daily in the morning  BP check every morning followup in 3 weeks

## 2013-08-21 NOTE — Progress Notes (Signed)
   Subjective:    Patient ID: Jesua Tamblyn, male    DOB: 08-12-1926, 80 y.o.   MRN: [id number]  HPI Garvis is an 80 year old married male nonsmoker who comes in today for reevaluation of hypertension  His blood pressures always been stable on clonidine 0.2 twice a day and verapamil 240 mg daily. He been on those medications for many years. Monday he came in because he didn't feel well. His blood pressure was 100/60 he asked he had a mini syncopal episode in the office. We will [place] put his feet up and year he didn't pass out. We kept him in bed rest at home and held all his blood pressure medication. He comes in today his BP right arm sitting position is 140/80 pulse is 70 and regular   Review of Systems    review of systems negative Objective:   Physical Exam  Well-developed well-nourished male no acute distress vital signs stable is afebrile BP 140/80 right arm sitting position      Assessment & Plan:  Hypertension restart medication but clonidine point daily followup in 3 weeks

## 2013-08-22 ENCOUNTER — Ambulatory Visit (INDEPENDENT_AMBULATORY_CARE_PROVIDER_SITE_OTHER): Payer: Medicare Other | Admitting: Gastroenterology

## 2013-08-22 ENCOUNTER — Encounter: Payer: Self-pay | Admitting: Gastroenterology

## 2013-08-22 ENCOUNTER — Telehealth: Payer: Self-pay | Admitting: Family Medicine

## 2013-08-22 VITALS — BP 120/68 | HR 64 | Ht 70.0 in | Wt 175.0 lb

## 2013-08-22 DIAGNOSIS — I880 Nonspecific mesenteric lymphadenitis: Secondary | ICD-10-CM

## 2013-08-22 DIAGNOSIS — Z7901 Long term (current) use of anticoagulants: Secondary | ICD-10-CM

## 2013-08-22 DIAGNOSIS — K219 Gastro-esophageal reflux disease without esophagitis: Secondary | ICD-10-CM

## 2013-08-22 DIAGNOSIS — Z86010 Personal history of colonic polyps: Secondary | ICD-10-CM

## 2013-08-22 NOTE — Patient Instructions (Signed)
You will receive a call from [facility] regarding your Cologuard screening test

## 2013-08-22 NOTE — Telephone Encounter (Signed)
Relevant patient education assigned to patient using Emmi. ° °

## 2013-08-22 NOTE — Progress Notes (Signed)
This is a 80 year old African American male with a long history of acid reflux and peptic strictures of his esophagus requiring numerous dilations.  He was recently admitted with protracted nausea and vomiting and was found to have suspected mesenteric lymphadenitis on CT scan.  He responded to NG suction and IV fluids and is currently asymptomatic.  He denies any upper or lower GI complaints.  He specifically denies nausea vomiting, dysphagia, acid reflux, melena or hematochezia or bowel or regularity.  He a previous colonoscopy and polypectomy in 2009.  At that time he had a small 6 mm cecal polyp removed.  Family history is noncontributory.  The patient currently has a normal appetite without any associated weight loss.  Review of his labs shows no evidence of significant anemia or other abnormalities. He is anticoagulated because of a previous TIA.  Colonoscopy also is revealed diverticulosis.  He has had previous treatment for prostate cancer.  He denies abuse or use of alcohol, cigarettes, or NSAIDs.  Current Medications, Allergies, Past Medical History, Past Surgical History, Family History and Social History were reviewed in [employer] record.  ROS: All systems were reviewed and are negative unless otherwise stated in the HPI.   Allergies  Allergen Reactions  . Ace Inhibitors Hives  . Penicillins Swelling    - of the face.   Outpatient Prescriptions Prior to Visit  Medication Sig Dispense Refill  . aspirin 81 MG tablet Take 1 tablet (81 mg total) by mouth daily.      . cloNIDine (CATAPRES) 0.2 MG tablet By mouth every morning  100 tablet  3  . clopidogrel (PLAVIX) 75 MG tablet Take 1 tablet (75 mg total) by mouth daily.  30 tablet  0  . omeprazole (PRILOSEC) 40 MG capsule Take 40 mg by mouth daily.      Marland Kitchen oxyCODONE-acetaminophen (ROXICET) 5-325 MG per tablet Take 1 tablet by mouth every 4 (four) hours as needed for pain.  30 tablet  0  . potassium chloride SA  (K-DUR,KLOR-CON) 20 MEQ tablet Take 20 mEq by mouth once.      . rosuvastatin (CRESTOR) 20 MG tablet Take 1 tablet (20 mg total) by mouth daily.      . verapamil (CALAN-SR) 240 MG CR tablet Take 240 mg by mouth at bedtime.       No facility-administered medications prior to visit.   Past Medical History  Diagnosis Date  . Hypertension   . Allergy   . ED (erectile dysfunction)   . Prostate cancer   . Hiatal hernia   . GERD (gastroesophageal reflux disease)   . Esophageal stricture   . Unspecified gastritis and gastroduodenitis without mention of hemorrhage   . Adenomatous colon polyp 2009  . Diverticulosis of colon (without mention of hemorrhage)   . Esophagitis    Past Surgical History  Procedure Laterality Date  . Esophagogastroduodenoscopy (egd) with esophageal dilation    . Prostate surgery      Laser surgery  . Endarterectomy Left 03/05/2013    Procedure: ENDARTERECTOMY CAROTID;  Surgeon: Conrad Hardin, MD;  Location: [place];  Service: Vascular;  Laterality: Left;   History   Social History  . Marital Status: Married    Spouse Name: N/A    Number of Children: N/A  . Years of Education: N/A   Social History Main Topics  . Smoking status: Former Research scientist (life sciences)  . Smokeless tobacco: Never Used     Comment: Was 40 years ago  . Alcohol  Use: Yes     Comment: daily--one alcohol drink a day  . Drug Use: No  . Sexual Activity: None   Other Topics Concern  . None   Social History Narrative  . None   Family History  Problem Relation Age of Onset  . Heart disease Father   . Cancer Mother   . Cancer Sister   . Hyperlipidemia Daughter   . Hypertension Daughter   . Hypertension Son             Physical Exam: Blood pressure 120/68, pulse 64 and regular and weight 175.  Healthy-appearing patient in no acute distress appearing his stated age.  His chest is clear he appears to be in a regular rhythm without murmurs gallops or rubs.  His abdomen is soft and nontender without  organomegaly.  Bowel sounds are entirely normal.  Rectal exam is deferred.  Mental status is normal    Assessment and Plan: His chronic GERD is well controlled with daily PPI therapy which I have urged him to continue.  We will do COLOGUARD stool DNA test for colon cancer although I do not think he has cecal carcinoma.  He is a poor candidate for colonoscopy at his age with his anticoagulated status.  Patient was a previous smoker but does not have known COPD.  His had previous carotid endarterectomy.  I do not think endoscopy is needed this times since he has no dysphagia, but he should continue with daily PPI therapy.  If this colorguard test is positive, we will schedule followup colonoscopy with Dr. Ardis Hughs.  Readjustment in his anticoagulants before the procedure.  Reflux regime also review with patient his wife and his son were present throughout the interview and exam.  His recent bowel obstruction seems to be consistent with mesenteric lymphadenitis which resolved spontaneously.  He did receive a brief course of IV metronidazole and ciprofloxacin.  Complete discharge summary is not available for review at this time.

## 2013-08-25 ENCOUNTER — Ambulatory Visit: Payer: Medicare Other | Admitting: Family Medicine

## 2013-08-28 NOTE — Telephone Encounter (Signed)
Pt has been resc to 330pm

## 2013-09-11 ENCOUNTER — Encounter: Payer: Self-pay | Admitting: Family Medicine

## 2013-09-11 ENCOUNTER — Ambulatory Visit (INDEPENDENT_AMBULATORY_CARE_PROVIDER_SITE_OTHER): Payer: Medicare Other | Admitting: Family Medicine

## 2013-09-11 VITALS — BP 140/80 | HR 74 | Temp 98.2°F | Wt 174.0 lb

## 2013-09-11 DIAGNOSIS — M545 Low back pain, unspecified: Secondary | ICD-10-CM

## 2013-09-11 DIAGNOSIS — I10 Essential (primary) hypertension: Secondary | ICD-10-CM

## 2013-09-11 LAB — POCT URINALYSIS DIPSTICK
BILIRUBIN UA: NEGATIVE
Blood, UA: NEGATIVE
GLUCOSE UA: NEGATIVE
Ketones, UA: NEGATIVE
LEUKOCYTES UA: NEGATIVE
Nitrite, UA: NEGATIVE
Protein, UA: NEGATIVE
Spec Grav, UA: 1.02
Urobilinogen, UA: 0.2
pH, UA: 6.5

## 2013-09-11 NOTE — Patient Instructions (Signed)
Continue your current medications  Check a morning blood pressure Monday Wednesday Friday  Followup in 3 months sooner if any problems

## 2013-09-11 NOTE — Progress Notes (Signed)
   Subjective:    Patient ID: Thomas [name], male    DOB: 14-Jan-1926, 80 y.o.   MRN: [id number]  HPI Thomas [name] is a 80 year old married male nonsmoker who comes in today for evaluation of hypertension  We saw him a couple weeks ago his blood pressure dropped in the 110/70 range and he was having near syncope and indeed he had a near syncopal episode in the office. We are [place] down his put his feet up in the air and he did not lose consciousness nor fall. We cut his clonidine down from 2 a day to one a day we've maintained his [place] at 240 mg daily and he comes back today for followup. He says he feels well blood pressure at home 140/90 no lightheaded spells.   Review of Systems    review of systems otherwise negative Objective:   Physical Exam  Well-developed well-nourished male no acute distress vital signs stable he is afebrile BP today 140/90      Assessment & Plan:  Hypertension at goal continue current therapy followup when necessary

## 2013-10-02 ENCOUNTER — Encounter: Payer: Self-pay | Admitting: Vascular Surgery

## 2013-10-03 ENCOUNTER — Other Ambulatory Visit (HOSPITAL_COMMUNITY): Payer: Medicare Other

## 2013-10-03 ENCOUNTER — Ambulatory Visit: Payer: Medicare Other | Admitting: Vascular Surgery

## 2013-10-28 ENCOUNTER — Emergency Department (HOSPITAL_COMMUNITY)
Admission: EM | Admit: 2013-10-28 | Discharge: 2013-10-28 | Disposition: A | Discharge status: Home / Self Care | Payer: No Typology Code available for payment source | Attending: Emergency Medicine | Admitting: Emergency Medicine

## 2013-10-28 ENCOUNTER — Emergency Department (HOSPITAL_COMMUNITY): Payer: No Typology Code available for payment source

## 2013-10-28 ENCOUNTER — Encounter (HOSPITAL_COMMUNITY): Payer: Self-pay | Admitting: Emergency Medicine

## 2013-10-28 DIAGNOSIS — I10 Essential (primary) hypertension: Secondary | ICD-10-CM | POA: Insufficient documentation

## 2013-10-28 DIAGNOSIS — Z880 Allergy status to penicillin: Secondary | ICD-10-CM | POA: Insufficient documentation

## 2013-10-28 DIAGNOSIS — Z8546 Personal history of malignant neoplasm of prostate: Secondary | ICD-10-CM | POA: Insufficient documentation

## 2013-10-28 DIAGNOSIS — Y9389 Activity, other specified: Secondary | ICD-10-CM | POA: Insufficient documentation

## 2013-10-28 DIAGNOSIS — K219 Gastro-esophageal reflux disease without esophagitis: Secondary | ICD-10-CM | POA: Insufficient documentation

## 2013-10-28 DIAGNOSIS — Z87891 Personal history of nicotine dependence: Secondary | ICD-10-CM | POA: Insufficient documentation

## 2013-10-28 DIAGNOSIS — T07XXXA Unspecified multiple injuries, initial encounter: Secondary | ICD-10-CM | POA: Insufficient documentation

## 2013-10-28 DIAGNOSIS — Z87448 Personal history of other diseases of urinary system: Secondary | ICD-10-CM | POA: Insufficient documentation

## 2013-10-28 DIAGNOSIS — Z860100 Personal history of colon polyps, unspecified: Secondary | ICD-10-CM | POA: Insufficient documentation

## 2013-10-28 DIAGNOSIS — Z7902 Long term (current) use of antithrombotics/antiplatelets: Secondary | ICD-10-CM | POA: Insufficient documentation

## 2013-10-28 DIAGNOSIS — Y92410 Unspecified street and highway as the place of occurrence of the external cause: Secondary | ICD-10-CM | POA: Insufficient documentation

## 2013-10-28 DIAGNOSIS — Z7982 Long term (current) use of aspirin: Secondary | ICD-10-CM | POA: Insufficient documentation

## 2013-10-28 DIAGNOSIS — Z79899 Other long term (current) drug therapy: Secondary | ICD-10-CM | POA: Insufficient documentation
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
## 2013-10-28 NOTE — Discharge Instructions (Signed)
Contusion Your testing today is negative for serious injury. Follow up with your doctor. Use tylenol or motrin as needed for pain. Return to the ED if you develop new or worsening symptoms. A contusion is a deep bruise. Contusions are the result of an injury that caused bleeding under the skin. The contusion may turn blue, purple, or yellow. Minor injuries will give you a painless contusion, but more severe contusions may stay painful and swollen for a few weeks.  CAUSES  A contusion is usually caused by a blow, trauma, or direct force to an area of the body. SYMPTOMS   Swelling and redness of the injured area.  Bruising of the injured area.  Tenderness and soreness of the injured area.  Pain. DIAGNOSIS  The diagnosis can be made by taking a history and physical exam. An X-ray, CT scan, or MRI may be needed to determine if there were any associated injuries, such as fractures. TREATMENT  Specific treatment will depend on what area of the body was injured. In general, the best treatment for a contusion is resting, icing, elevating, and applying cold compresses to the injured area. Over-the-counter medicines may also be recommended for pain control. Ask your caregiver what the best treatment is for your contusion. HOME CARE INSTRUCTIONS   Put ice on the injured area.  Put ice in a plastic bag.  Place a towel between your skin and the bag.  Leave the ice on for 15-20 minutes, 03-04 times a day.  Only take over-the-counter or prescription medicines for pain, discomfort, or fever as directed by your caregiver. Your caregiver may recommend avoiding anti-inflammatory medicines (aspirin, ibuprofen, and naproxen) for 48 hours because these medicines may increase bruising.  Rest the injured area.  If possible, elevate the injured area to reduce swelling. SEEK IMMEDIATE MEDICAL CARE IF:   You have increased bruising or swelling.  You have pain that is getting worse.  Your swelling or pain  is not relieved with medicines. MAKE SURE YOU:   Understand these instructions.  Will watch your condition.  Will get help right away if you are not doing well or get worse. Document Released: 05/10/2005 Document Revised: 10/23/2011 Document Reviewed: 06/05/2011 [facility] Patient Information 2014 [place], [place].
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
## 2013-10-30 ENCOUNTER — Ambulatory Visit: Payer: Medicare Other | Admitting: Family Medicine

## 2013-11-03 ENCOUNTER — Ambulatory Visit (INDEPENDENT_AMBULATORY_CARE_PROVIDER_SITE_OTHER): Payer: Self-pay | Admitting: Family Medicine

## 2013-11-03 ENCOUNTER — Encounter: Payer: Self-pay | Admitting: Family Medicine

## 2013-11-03 VITALS — BP 110/80 | Temp 98.0°F | Wt 179.0 lb

## 2013-11-03 DIAGNOSIS — I10 Essential (primary) hypertension: Secondary | ICD-10-CM

## 2013-11-03 DIAGNOSIS — R55 Syncope and collapse: Secondary | ICD-10-CM

## 2013-11-03 NOTE — Progress Notes (Signed)
Pre visit review using our clinic review tool, if applicable. No additional management support is needed unless otherwise documented below in the visit note. 

## 2013-11-03 NOTE — Patient Instructions (Signed)
Call and set up a time to see a neurologist for followup  Do not take verapamil tomorrow restarted on Wednesday by taking a half a pill a day  Check your blood pressure daily in the morning  Return to see me in 3 weeks  Do not drive until cleared by neurology
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
## 2013-11-05 ENCOUNTER — Encounter: Payer: Self-pay | Admitting: Neurology

## 2013-11-05 ENCOUNTER — Ambulatory Visit (INDEPENDENT_AMBULATORY_CARE_PROVIDER_SITE_OTHER): Payer: Medicare Other | Admitting: Neurology

## 2013-11-05 VITALS — BP 140/80 | HR 78 | Temp 98.2°F | Ht 70.0 in | Wt 182.6 lb

## 2013-11-05 DIAGNOSIS — R55 Syncope and collapse: Secondary | ICD-10-CM

## 2013-11-05 NOTE — Patient Instructions (Signed)
1. Schedule routine EEG 2. Continue to monitor BP at home 3. Follow-up in 3 months
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
## 2013-11-06 ENCOUNTER — Encounter: Payer: Self-pay | Admitting: Vascular Surgery

## 2013-11-07 ENCOUNTER — Ambulatory Visit (HOSPITAL_COMMUNITY)
Admission: RE | Admit: 2013-11-07 | Discharge: 2013-11-07 | Disposition: A | Discharge status: Home / Self Care | Payer: Medicare Other | Source: Ambulatory Visit | Attending: Vascular Surgery | Admitting: Vascular Surgery

## 2013-11-07 ENCOUNTER — Encounter: Payer: Self-pay | Admitting: Vascular Surgery

## 2013-11-07 ENCOUNTER — Ambulatory Visit (INDEPENDENT_AMBULATORY_CARE_PROVIDER_SITE_OTHER): Payer: Medicare Other | Admitting: Vascular Surgery

## 2013-11-07 VITALS — BP 139/91 | HR 72 | Ht 70.0 in | Wt 180.2 lb

## 2013-11-07 DIAGNOSIS — I658 Occlusion and stenosis of other precerebral arteries: Secondary | ICD-10-CM | POA: Insufficient documentation

## 2013-11-07 DIAGNOSIS — I6529 Occlusion and stenosis of unspecified carotid artery: Secondary | ICD-10-CM | POA: Insufficient documentation

## 2013-11-07 DIAGNOSIS — Z48812 Encounter for surgical aftercare following surgery on the circulatory system: Secondary | ICD-10-CM | POA: Insufficient documentation

## 2013-11-07 NOTE — Progress Notes (Signed)
    Established Carotid Patient  History of Present Illness  Thomas [name] is a 80 y.o. (Dec 17, 1925) male who presents with chief complaint: routine surveillance.  Pt s/p L CEA. Patient has no history of TIA or stroke symptom.  The patient has never had amaurosis fugax or monocular blindness.  The patient has never had facial drooping or hemiplegia.  The patient has never had receptive or expressive aphasia.    The patient's PMH, PSH, SH, FamHx, Med, and Allergies are unchanged from 03/05/13.  On ROS today: no TIA or CVA sx, no intermittent claudication or rest pain  Physical Examination  Filed Vitals:   11/07/13 1036 11/07/13 1039  BP: 126/88 139/91  Pulse: 72   Height: 5\' 10"  (1.778 m)   Weight: 180 lb 3.2 oz (81.738 kg)   SpO2: 100%    Body mass index is 25.86 kg/(m^2).  General: A&O x 3, WDWN  Eyes: PERRLA, EOMI  Neck: Supple, no nuchal rigidity, no palpable LAD, well healed incision  Pulmonary: Sym exp, good air movt, CTAB, no rales, rhonchi, & wheezing  Cardiac: RRR, Nl S1, S2, no Murmurs, rubs or gallops  Vascular:  Vessel  Right  Left   Radial  Palpable  Palpable   Brachial  Palpable  Palpable   Carotid  Palpable, without bruit  Palpable, without bruit   Aorta  Not palpable  N/A   Femoral  Palpable  Palpable   Popliteal  Not palpable  Not palpable   PT  Palpable  Palpable   DP  Palpable  Faintly Palpable    Gastrointestinal: soft, NTND, -G/R, - HSM, - masses, - CVAT B  Musculoskeletal: M/S 5/5 throughout , Extremities without ischemic changes   Neurologic: CN 2-12 intact , Pain and light touch intact in extremities , Motor exam as listed above  Non-Invasive Vascular Imaging  CAROTID DUPLEX (Date: 11/07/2013 ):   R ICA stenosis: <40%  R VA: patent and antegrade  L ICA stenosis: patent CEA  L VA: patent and antegrade  Medical Decision Making  Thomas [name] is a 80 y.o. male who presents with: asx R ICA stenosis <40%., s/p L CEA   Based on the  patient's vascular studies and examination, I have offered the patient: q6 month B carotid duplex for VQI purposes.  I discussed in depth with the patient the nature of atherosclerosis, and emphasized the importance of maximal medical management including strict control of blood pressure, blood glucose, and lipid levels, antiplatelet agents, obtaining regular exercise, and cessation of smoking.    The patient is aware that without maximal medical management the underlying atherosclerotic disease process will progress, limiting the benefit of any interventions. The patient is currently on a statin: Crestor. The patient is currently on an anti-platelet: ASA.  Thank you for allowing [place] to participate in this patient's care.  Adele Barthel, MD [facility] Office: [phone] Pager: [phone]  11/07/2013, 10:55 AM

## 2013-11-10 NOTE — Addendum Note (Signed)
Addended by: Mena Goes on: 11/10/2013 05:30 PM   Modules accepted: Orders

## 2013-11-13 ENCOUNTER — Ambulatory Visit (HOSPITAL_COMMUNITY)
Admission: RE | Admit: 2013-11-13 | Discharge: 2013-11-13 | Disposition: A | Discharge status: Home / Self Care | Payer: Medicare Other | Source: Ambulatory Visit | Attending: Neurology | Admitting: Neurology

## 2013-11-13 DIAGNOSIS — R404 Transient alteration of awareness: Secondary | ICD-10-CM

## 2013-11-13 DIAGNOSIS — R55 Syncope and collapse: Secondary | ICD-10-CM | POA: Insufficient documentation

## 2013-11-20 NOTE — Procedures (Signed)
ELECTROENCEPHALOGRAM REPORT  Date of Study: 11/13/2013  Patient's Name: Thomas [name] MRN: [id number] Date of Birth: 05-03-1926  Referring Provider: Dr. Ellouise Newer  Clinical History: This is an 80 year old man with recurrent episodes of loss of consciousness.   Medications: Aspirin, clonidine, Plavix, Flagyl, Crestor, verapamil  Technical Summary: A multichannel digital EEG recording measured by the international 10-20 system with electrodes applied with paste and impedances below 5000 ohms performed in our laboratory with EKG monitoring in an awake and asleep patient.  Hyperventilation was not performed.  Photic stimulation was performed.  The digital EEG was referentially recorded, reformatted, and digitally filtered in a variety of bipolar and referential montages for optimal display.  Spike detection software was employed.  Description: The patient is awake and asleep during the recording.  During maximal wakefulness, there is a symmetric, medium voltage 10 Hz posterior dominant rhythm that attenuates with eye opening.  The record is symmetric.  During drowsiness and sleep, there is an increase in theta slowing of the background.  Vertex waves and symmetric sleep spindles were seen.  Photic stimulation did not elicit any abnormalities.  There were no epileptiform discharges or electrographic seizures seen.    EKG lead was unremarkable.  Impression: This awake and asleep EEG is normal.    Clinical Correlation: A normal EEG does not exclude a clinical diagnosis of epilepsy. If further clinical questions remain, prolonged EEG may be helpful.  Clinical correlation is advised.   Ellouise Newer, M.D.

## 2013-11-21 ENCOUNTER — Ambulatory Visit: Payer: Medicare Other | Admitting: Neurology

## 2013-11-24 ENCOUNTER — Encounter: Payer: Self-pay | Admitting: Family Medicine

## 2013-11-24 ENCOUNTER — Ambulatory Visit (INDEPENDENT_AMBULATORY_CARE_PROVIDER_SITE_OTHER): Payer: Medicare Other | Admitting: Family Medicine

## 2013-11-24 VITALS — BP 120/70 | Temp 98.5°F | Wt 180.0 lb

## 2013-11-24 DIAGNOSIS — H7290 Unspecified perforation of tympanic membrane, unspecified ear: Secondary | ICD-10-CM

## 2013-11-24 DIAGNOSIS — R059 Cough, unspecified: Secondary | ICD-10-CM | POA: Insufficient documentation

## 2013-11-24 DIAGNOSIS — R05 Cough: Secondary | ICD-10-CM

## 2013-11-24 DIAGNOSIS — H7291 Unspecified perforation of tympanic membrane, right ear: Secondary | ICD-10-CM | POA: Insufficient documentation

## 2013-11-24 MED ORDER — HYDROCODONE-HOMATROPINE 5-1.5 MG/5ML PO SYRP: ORAL_SOLUTION | ORAL | Status: DC

## 2013-11-24 MED ORDER — CLARITHROMYCIN 500 MG PO TABS: 500.0000 mg | ORAL_TABLET | Freq: Two times a day (BID) | ORAL | Status: DC

## 2013-11-24 NOTE — Progress Notes (Signed)
   Subjective:    Patient ID: Thomas [name], male    DOB: 1926-07-29, 80 y.o.   MRN: [id number]  HPI Thomas [name] is a 80 year old male who comes in today for evaluation of a cough for 6 days in drainage from his right ear times one day  6 days ago he developed allergy symptoms may be viral with a condition rhinitis and cough. Yesterday he noticed sudden onset of fluid draining out of his right ear. No fever no pain no vertigo no hearing loss   Review of Systems Review of systems negative    Objective:   Physical Exam  Well-developed and nourished in no acute distress vital signs stable he is afebrile ENT exam the pertinent in the has a ruptured right TM. Left normal lungs clear      Assessment & Plan:  Thyroid allergy symptoms with right perforation of tympanic membrane,,,,,,,, see orders

## 2013-11-24 NOTE — Patient Instructions (Signed)
Biaxin 500,,,,,,,,, one twice daily till bilateral empty  Hydromet,,,,,,, 1/2-1 teaspoon 3 times daily. For cough  Return in one week for followup

## 2013-12-01 ENCOUNTER — Encounter: Payer: Self-pay | Admitting: Family Medicine

## 2013-12-01 ENCOUNTER — Ambulatory Visit (INDEPENDENT_AMBULATORY_CARE_PROVIDER_SITE_OTHER): Payer: Medicare Other | Admitting: Family Medicine

## 2013-12-01 VITALS — BP 150/80 | Temp 98.5°F | Wt 178.0 lb

## 2013-12-01 DIAGNOSIS — H7291 Unspecified perforation of tympanic membrane, right ear: Secondary | ICD-10-CM

## 2013-12-01 DIAGNOSIS — H7290 Unspecified perforation of tympanic membrane, unspecified ear: Secondary | ICD-10-CM

## 2013-12-01 NOTE — Patient Instructions (Signed)
Call Dr. Radene Journey,,,,,,,, your nose and throat

## 2013-12-01 NOTE — Progress Notes (Signed)
   Subjective:    Patient ID: Thomas [name], male    DOB: September 19, 1925, 80 y.o.   MRN: [id number]  HPI Thomas [name] is a 80 year old male who comes in today accompanied by his wife, his daughter, and his son, for evaluation of hearing loss in his right ear  We saw him a while back he had an infection burst is tympanic membrane spontaneously and we felt that it would heal. We put him on antibiotics for couple weeks. He's back today saying he still can't hear out of his right ear   Review of Systems Negative    Objective:   Physical Exam  Well-developed well-nourished male no acute distress vital signs stable he is afebrile left ear normal right ear shows evidence of persistent serous otitis media. The perforation is well healed      Assessment & Plan:  Persistent fluid right ear........ [facility] consult

## 2013-12-01 NOTE — Progress Notes (Signed)
Pre visit review using our clinic review tool, if applicable. No additional management support is needed unless otherwise documented below in the visit note. 

## 2013-12-16 ENCOUNTER — Encounter: Payer: Self-pay | Admitting: Family Medicine

## 2013-12-16 ENCOUNTER — Ambulatory Visit (INDEPENDENT_AMBULATORY_CARE_PROVIDER_SITE_OTHER): Payer: Medicare Other | Admitting: Family Medicine

## 2013-12-16 VITALS — BP 130/90 | Temp 98.7°F | Wt 178.0 lb

## 2013-12-16 DIAGNOSIS — I10 Essential (primary) hypertension: Secondary | ICD-10-CM

## 2013-12-16 DIAGNOSIS — Z8546 Personal history of malignant neoplasm of prostate: Secondary | ICD-10-CM

## 2013-12-16 DIAGNOSIS — H7290 Unspecified perforation of tympanic membrane, unspecified ear: Secondary | ICD-10-CM

## 2013-12-16 DIAGNOSIS — R55 Syncope and collapse: Secondary | ICD-10-CM

## 2013-12-16 DIAGNOSIS — H7291 Unspecified perforation of tympanic membrane, right ear: Secondary | ICD-10-CM

## 2013-12-16 MED ORDER — OXYBUTYNIN CHLORIDE ER 10 MG PO TB24: 10.0000 mg | ORAL_TABLET | Freq: Every day | ORAL | Status: DC

## 2013-12-16 NOTE — Patient Instructions (Signed)
Ditropan 5 mg.........Marland Kitchen 1 tablet Monday Wednesday Friday at bedtime  Return when necessary

## 2013-12-16 NOTE — Progress Notes (Signed)
   Subjective:    Patient ID: Thomas [name], male    DOB: 08-Jun-1926, 80 y.o.   MRN: [id number]  HPI Thomas [name] is a 80 year old male who comes in today accompanied by her son for evaluation of multiple issues  We have been adjusting his medication because he's had some episodes of hypotension with syncope. On Catapres 0.2 daily and verapamil 240 mg each bedtime his blood pressure is 130/90 and no more syncopal episodes.  He takes oxybutynin 5 mg from the [facility] and doesn't have a refill  We saw him last he had some fluid in his right ear. He went to ENT the perfect his eardrum fluid drained but that reaccumulated. Now he can hear again that right ear   Review of Systems    negative Objective:   Physical Exam  Well-developed well-nourished male in no acute distress HEENT pertinent and his right ear is full of fluid. Neck was supple no adenopathy BP 130/90 pulse 70 and regular      Assessment & Plan:  Hypertension at goal continue current therapy  Serous otitis media,,,,,,,,, followup by ENT this Thursday  History of prostate cancer oxybutynin 5 mg daily,

## 2013-12-23 ENCOUNTER — Telehealth: Payer: Self-pay | Admitting: Family Medicine
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
## 2013-12-23 NOTE — Telephone Encounter (Signed)
Spoke with daughter.  Patient should not drive per family and Dr Honor Junes conversation.  Patient is aware that he should follow up with Dr Sherren Mocha in September.  Dr Sherren Mocha also explained that there is a DMV test the patient can take or he can go back to neurology for further instructions.

## 2013-12-26 ENCOUNTER — Telehealth: Payer: Self-pay | Admitting: Family Medicine

## 2013-12-26 NOTE — Telephone Encounter (Signed)
Son would like to know who will sign pt's paperwork to get restrictions on his driving? Pt is driving again and dr todd told him not to drive for 6 months due to seizures. Would like to know the best way to handle this bc its a sensitive matter.   [place] is here from  [place], so pls call asap

## 2013-12-26 NOTE — Telephone Encounter (Signed)
Left message on machine returning sons call

## 2014-05-14 ENCOUNTER — Encounter: Payer: Self-pay | Admitting: Vascular Surgery

## 2014-05-15 ENCOUNTER — Other Ambulatory Visit: Payer: Self-pay | Admitting: Vascular Surgery

## 2014-05-15 ENCOUNTER — Encounter: Payer: Self-pay | Admitting: Vascular Surgery

## 2014-05-15 ENCOUNTER — Ambulatory Visit (HOSPITAL_COMMUNITY)
Admission: RE | Admit: 2014-05-15 | Discharge: 2014-05-15 | Disposition: A | Discharge status: Home / Self Care | Payer: Medicare Other | Source: Ambulatory Visit | Attending: Vascular Surgery | Admitting: Vascular Surgery

## 2014-05-15 ENCOUNTER — Ambulatory Visit (INDEPENDENT_AMBULATORY_CARE_PROVIDER_SITE_OTHER): Payer: Medicare Other | Admitting: Vascular Surgery

## 2014-05-15 VITALS — BP 142/88 | HR 57 | Ht 70.0 in | Wt 181.6 lb

## 2014-05-15 DIAGNOSIS — Z48812 Encounter for surgical aftercare following surgery on the circulatory system: Secondary | ICD-10-CM

## 2014-05-15 DIAGNOSIS — I6522 Occlusion and stenosis of left carotid artery: Secondary | ICD-10-CM

## 2014-05-15 DIAGNOSIS — I6529 Occlusion and stenosis of unspecified carotid artery: Secondary | ICD-10-CM

## 2014-05-15 NOTE — Addendum Note (Signed)
Addended by: Dorthula Rue L on: 05/15/2014 02:19 PM   Modules accepted: Orders

## 2014-05-15 NOTE — Progress Notes (Signed)
Established Carotid Patient  History of Present Illness  Thomas [name] is a 80 y.o. (1926-05-19) male who presents with chief complaint: routine surveillance.  Previous carotid studies demonstrated: RICA <70% stenosis, LICA: widely patent s/p CEA.  Patient has no history of TIA or stroke symptom.  The patient has never had amaurosis fugax or monocular blindness.  The patient has never had facial drooping or hemiplegia.  The patient has never had receptive or expressive aphasia.    The patient's PMH, PSH, SH, FamHx, Med, and Allergies are unchanged from 11/07/13.  On ROS today: no TIA or CVA sx, no intermittent claudication or rest pain  Physical Examination  Filed Vitals:   05/15/14 1115 05/15/14 1117  BP: 140/82 142/88  Pulse: 57   Height: 5\' 10"  (1.778 m)   Weight: 181 lb 9.6 oz (82.373 kg)   SpO2: 96%    Body mass index is 26.06 kg/(m^2).  General: A&O x 3, WDWN  Eyes: PERRLA, EOMI  Neck: Supple, no nuchal rigidity, no palpable LAD  Pulmonary: Sym exp, good air movt, CTAB, no rales, rhonchi, & wheezing  Cardiac: RRR, Nl S1, S2, no Murmurs, rubs or gallops  Vascular: Vessel Right Left  Radial Palpable Palpable  Brachial Palpable Palpable  Carotid Palpable, without bruit Palpable, without bruit  Aorta Not palpable N/A  Femoral Palpable Palpable  Popliteal Not palpable Not palpable  PT Palpable Palpable  DP Palpable Palpable   Gastrointestinal: soft, NTND, -G/R, - HSM, - masses, - CVAT B,   Musculoskeletal: M/S 5/5 throughout , Extremities without ischemic changes   Neurologic: CN 2-12 intact , Pain and light touch intact in extremities , Motor exam as listed above  Non-Invasive Vascular Imaging  CAROTID DUPLEX (Date: 05/15/2014 ):   R ICA stenosis: <40%  R VA: patent and antegrade  L ICA: widely patent s/p CEA  L VA: patent and antegrade  Medical Decision Making  Thomas [name] is a 80 y.o. male who presents with: asx R ICA stenosis <40%, s/p L CEA  without any signs of restenosis   Based on the patient's vascular studies and examination, I have offered the patient: annual surveillance.  I discussed in depth with the patient the nature of atherosclerosis, and emphasized the importance of maximal medical management including strict control of blood pressure, blood glucose, and lipid levels, antiplatelet agents, obtaining regular exercise, and cessation of smoking.    The patient is aware that without maximal medical management the underlying atherosclerotic disease process will progress, limiting the benefit of any interventions. The patient is currently on a statin: Crestor. The patient is currently on an anti-platelet: ASA, Plavix.  Thank you for allowing [place] to participate in this patient's care.  Adele Barthel, MD [facility] Office: [phone] Pager: [phone]  05/15/2014, 11:32 AM  --- For VQI Registry use --- Instructions: Press F2 to tab through selections.  Delete question if not applicable.   Modified Rankin score at D/C (0-6): Rankin Score=0  IV medication needed for:  1. Hypertension: No 2. Hypotension: No  Post-op Complications: No  Discharge medications: Statin use:  Yes ASA use:  Yes Beta blocker use:  No  for medical reason Not indicated ACE-Inhibitor use:  No  for medical reason Not indicated P2Y12 Antagonist use: [ ]  None, [x ] Plavix, [ ]  Plasugrel, [ ]  Ticlopinine, [ ]  Ticagrelor, [ ]  Other, [ ]  No for medical reason, [ ]  Non-compliant, [ ]  Not-indicated Anti-coagulant use:  [ x] None, [ ]   Warfarin, [ ]  Rivaroxaban, [ ]  Dabigatran, [ ]  Other, [ ]  No for medical reason, [ ]  Non-compliant, [ ]  Not-indicated

## 2014-06-02 ENCOUNTER — Ambulatory Visit (INDEPENDENT_AMBULATORY_CARE_PROVIDER_SITE_OTHER): Payer: Medicare Other

## 2014-06-02 DIAGNOSIS — Z23 Encounter for immunization: Secondary | ICD-10-CM

## 2014-10-15 ENCOUNTER — Telehealth: Payer: Self-pay | Admitting: Family Medicine

## 2014-10-15 NOTE — Telephone Encounter (Signed)
[facility] - Client [facility] Patient Name: Thomas [name] DOB: 04-02-1926 Initial Comment Caller States father has been spitting up for the past few days and its clear. its not really vomiting just spitting up. no other symptoms. Nurse Assessment Nurse: Malva Cogan, RN, Juliann Pulse Date/Time Eilene Ghazi Time): 10/15/2014 4:03:37 PM Confirm and document reason for call. If symptomatic, describe symptoms. ---Caller states that she is not currently with her father, advised that she needs to call back when she is with her father as triager needs to obtain permission from her father to speak with her about his medical conditions & also that to adequately & thoroughly triage her father's symptoms that she needs to be with him to do so. Has the patient traveled out of the country within the last 30 days? ---No Does the patient require triage? ---No Please document clinical information provided and list any resource used. ---See previous note. Guidelines Guideline Title Affirmed Question Affirmed Notes Final Disposition User Clinical Call Malva Cogan, RN, Juliann Pulse Comments Caller provided incorrect DOB for her father, triager located under [facility] at same address with DOB being 02/09/1926. D/W charge nurse Alcario Drought. about discrepancy with DOB, advised by Estill Bamberg to change pt's DOB as caller was not sure of his DOB when asked & there is no other Arnette Norris with DOB that caller provided but address is same as one that has DOB of 10/06/1925.

## 2014-10-16 ENCOUNTER — Ambulatory Visit (INDEPENDENT_AMBULATORY_CARE_PROVIDER_SITE_OTHER): Payer: Medicare Other | Admitting: Internal Medicine

## 2014-10-16 ENCOUNTER — Encounter: Payer: Self-pay | Admitting: Internal Medicine

## 2014-10-16 VITALS — BP 110/72 | HR 57 | Temp 98.0°F | Resp 18 | Ht 70.0 in | Wt 186.0 lb

## 2014-10-16 DIAGNOSIS — K222 Esophageal obstruction: Secondary | ICD-10-CM

## 2014-10-16 DIAGNOSIS — I10 Essential (primary) hypertension: Secondary | ICD-10-CM

## 2014-10-16 MED ORDER — OMEPRAZOLE 40 MG PO CPDR: 40.0000 mg | DELAYED_RELEASE_CAPSULE | Freq: Every day | ORAL | Status: DC

## 2014-10-16 NOTE — Patient Instructions (Signed)
Avoids foods high in acid such as tomatoes citrus juices, and spicy foods.  Avoid eating within two hours of lying down or before exercising.  Do not overheat.  Try smaller more frequent meals.   Soft diet over the next few days and then advance as tolerated  Continue omeprazole daily as discussed  Return in one month for follow-up  Call if any recurrent episodes of vomiting

## 2014-10-16 NOTE — Progress Notes (Signed)
Subjective:    Patient ID: Thomas [name], male    DOB: 1926/02/26, 81 y.o.   MRN: [id number]  HPI  Wt Readings from Last 3 Encounters:  10/16/14 186 lb (84.369 kg)  05/15/14 181 lb 9.6 oz (82.373 kg)  12/16/13 178 lb (80.45 kg)   81 year old patient who has a history of a hiatal hernia and recurrent esophageal strictures requiring dilatations.  For the past 2 days he has had some solid food dysphagia.  Solid food has become lodged on 2 occasions requiring emesis for relief of symptoms.  Today he has tolerated cereal without difficulty and has had no recurrent vomiting.  No weight loss, or pain with swallowing His last esophageal dilatation was in October 2012.  He has self discontinued PPI therapy.  Some months ago.  Past Medical History  Diagnosis Date  . Hypertension   . Allergy   . ED (erectile dysfunction)   . Prostate cancer   . Hiatal hernia   . GERD (gastroesophageal reflux disease)   . Esophageal stricture   . Unspecified gastritis and gastroduodenitis without mention of hemorrhage   . Adenomatous colon polyp 2009  . Diverticulosis of colon (without mention of hemorrhage)   . Esophagitis   . Peripheral vascular disease   . Carotid artery occlusion     History   Social History  . Marital Status: Married    Spouse Name: N/A  . Number of Children: N/A  . Years of Education: N/A   Occupational History  . Not on file.   Social History Main Topics  . Smoking status: Former Research scientist (life sciences)  . Smokeless tobacco: Never Used     Comment: Was 40 years ago  . Alcohol Use: Yes     Comment: daily--one alcohol drink a day  . Drug Use: No  . Sexual Activity: Not on file   Other Topics Concern  . Not on file   Social History Narrative    Past Surgical History  Procedure Laterality Date  . Esophagogastroduodenoscopy (egd) with esophageal dilation    . Prostate surgery      Laser surgery  . Endarterectomy Left 03/05/2013    Procedure: ENDARTERECTOMY CAROTID;  Surgeon:  Conrad Neskowin, MD;  Location: [facility];  Service: Vascular;  Laterality: Left;  . Carotid endarterectomy      Family History  Problem Relation Age of Onset  . Heart disease Father   . Cancer Mother   . Cancer Sister   . Hyperlipidemia Daughter   . Hypertension Daughter   . Hypertension Son     Allergies  Allergen Reactions  . Ace Inhibitors Hives  . Penicillins Swelling    Swelling  of the face.    Current Outpatient Prescriptions on File Prior to Visit  Medication Sig Dispense Refill  . aspirin 81 MG tablet Take 1 tablet (81 mg total) by mouth daily.    . cloNIDine (CATAPRES) 0.2 MG tablet By mouth every morning 100 tablet 3  . oxybutynin (DITROPAN) 5 MG tablet Take 5 mg by mouth every 8 (eight) hours as needed for bladder spasms.    . verapamil (CALAN-SR) 240 MG CR tablet Take 240 mg by mouth at bedtime.    Marland Kitchen omeprazole (PRILOSEC) 40 MG capsule Take 40 mg by mouth daily.     No current facility-administered medications on file prior to visit.    BP 110/72 mmHg  Pulse 57  Temp(Src) 98 F (36.7 C) (Oral)  Resp 18  Ht 5\' 10"  (1.778 m)  Wt 186 lb (84.369 kg)  BMI 26.69 kg/m2  SpO2 96%     Review of Systems  Constitutional: Negative for fever, chills, appetite change and fatigue.  HENT: Negative for congestion, dental problem, ear pain, hearing loss, sore throat, tinnitus, trouble swallowing and voice change.   Eyes: Negative for pain, discharge and visual disturbance.  Respiratory: Negative for cough, chest tightness, wheezing and stridor.   Cardiovascular: Negative for chest pain, palpitations and leg swelling.  Gastrointestinal: Positive for vomiting. Negative for nausea, abdominal pain, diarrhea, constipation, blood in stool and abdominal distention.  Genitourinary: Negative for urgency, hematuria, flank pain, discharge, difficulty urinating and genital sores.  Musculoskeletal: Negative for myalgias, back pain, joint swelling, arthralgias, gait problem and neck  stiffness.  Skin: Negative for rash.  Neurological: Negative for dizziness, syncope, speech difficulty, weakness, numbness and headaches.  Hematological: Negative for adenopathy. Does not bruise/bleed easily.  Psychiatric/Behavioral: Negative for behavioral problems and dysphoric mood. The patient is not nervous/anxious.        Objective:   Physical Exam  Constitutional: He is oriented to person, place, and time. He appears well-developed.  HENT:  Head: Normocephalic.  Right Ear: External ear normal.  Left Ear: External ear normal.  Eyes: Conjunctivae and EOM are normal.  Neck: Normal range of motion.  Cardiovascular: Normal rate and normal heart sounds.   Pulmonary/Chest: Breath sounds normal.  Abdominal: Soft. Bowel sounds are normal. He exhibits no distension and no mass. There is no tenderness. There is no rebound and no guarding.  Musculoskeletal: Normal range of motion. He exhibits no edema or tenderness.  Neurological: He is alert and oriented to person, place, and time.  Psychiatric: He has a normal mood and affect. His behavior is normal.          Assessment & Plan:   History of esophageal hernia, esophageal stricture.  History of solid food dysphagia.  Will place on a mechanical soft diet and advance diet as tolerated.  Will resume PPI therapy.  Antireflux measures discussed.  Likely will need recurrent esophageal dilatation Hypertension, stable  Recheck one month

## 2014-12-09 ENCOUNTER — Encounter: Payer: Self-pay | Admitting: Cardiology

## 2015-05-18 ENCOUNTER — Encounter: Payer: Self-pay | Admitting: Family Medicine

## 2015-05-18 ENCOUNTER — Ambulatory Visit (INDEPENDENT_AMBULATORY_CARE_PROVIDER_SITE_OTHER): Payer: Medicare Other | Admitting: Family Medicine

## 2015-05-18 ENCOUNTER — Telehealth: Payer: Self-pay | Admitting: *Deleted

## 2015-05-18 VITALS — BP 118/78 | Temp 98.3°F | Ht 66.5 in | Wt 184.0 lb

## 2015-05-18 DIAGNOSIS — Z0000 Encounter for general adult medical examination without abnormal findings: Secondary | ICD-10-CM

## 2015-05-18 DIAGNOSIS — Z8546 Personal history of malignant neoplasm of prostate: Secondary | ICD-10-CM | POA: Diagnosis not present

## 2015-05-18 DIAGNOSIS — I10 Essential (primary) hypertension: Secondary | ICD-10-CM | POA: Diagnosis not present

## 2015-05-18 DIAGNOSIS — K222 Esophageal obstruction: Secondary | ICD-10-CM

## 2015-05-18 DIAGNOSIS — Z23 Encounter for immunization: Secondary | ICD-10-CM

## 2015-05-18 LAB — POCT URINALYSIS DIPSTICK
Bilirubin, UA: NEGATIVE
Glucose, UA: NEGATIVE
KETONES UA: NEGATIVE
Leukocytes, UA: NEGATIVE
Nitrite, UA: NEGATIVE
PH UA: 5.5
SPEC GRAV UA: 1.025
Urobilinogen, UA: 0.2

## 2015-05-18 LAB — CBC WITH DIFFERENTIAL/PLATELET
BASOS PCT: 0.5 % (ref 0.0–3.0)
Basophils Absolute: 0 10*3/uL (ref 0.0–0.1)
EOS ABS: 0.1 10*3/uL (ref 0.0–0.7)
Eosinophils Relative: 1.4 % (ref 0.0–5.0)
HCT: 47 % (ref 39.0–52.0)
Hemoglobin: 15.5 g/dL (ref 13.0–17.0)
Lymphocytes Relative: 19.7 % (ref 12.0–46.0)
Lymphs Abs: 1.1 10*3/uL (ref 0.7–4.0)
MCHC: 33 g/dL (ref 30.0–36.0)
MCV: 98.2 fl (ref 78.0–100.0)
Monocytes Absolute: 0.8 10*3/uL (ref 0.1–1.0)
Monocytes Relative: 14.9 % — ABNORMAL HIGH (ref 3.0–12.0)
NEUTROS ABS: 3.6 10*3/uL (ref 1.4–7.7)
Neutrophils Relative %: 63.5 % (ref 43.0–77.0)
PLATELETS: 239 10*3/uL (ref 150.0–400.0)
RBC: 4.78 Mil/uL (ref 4.22–5.81)
RDW: 14.6 % (ref 11.5–15.5)
WBC: 5.7 10*3/uL (ref 4.0–10.5)

## 2015-05-18 LAB — BASIC METABOLIC PANEL
BUN: 14 mg/dL (ref 6–23)
CO2: 28 mEq/L (ref 19–32)
Calcium: 9.4 mg/dL (ref 8.4–10.5)
Chloride: 102 mEq/L (ref 96–112)
Creatinine, Ser: 1.4 mg/dL (ref 0.40–1.50)
GFR: 61.57 mL/min (ref >60.00)
Glucose, Bld: 106 mg/dL — ABNORMAL HIGH (ref 70–99)
Potassium: 4.5 mEq/L (ref 3.5–5.1)
Sodium: 140 mEq/L (ref 135–145)

## 2015-05-18 LAB — TSH: TSH: 1.07 u[IU]/mL (ref 0.35–4.50)

## 2015-05-18 LAB — PSA: PSA: 4.16 ng/mL — AB (ref 0.10–4.00)

## 2015-05-18 MED ORDER — VERAPAMIL HCL ER 240 MG PO TBCR: 240.0000 mg | EXTENDED_RELEASE_TABLET | Freq: Every day | ORAL | Status: DC

## 2015-05-18 MED ORDER — CLONIDINE HCL 0.1 MG PO TABS: 0.1000 mg | ORAL_TABLET | Freq: Three times a day (TID) | ORAL | Status: DC

## 2015-05-18 MED ORDER — OMEPRAZOLE 40 MG PO CPDR: 40.0000 mg | DELAYED_RELEASE_CAPSULE | Freq: Every day | ORAL | Status: DC

## 2015-05-18 MED ORDER — OXYBUTYNIN CHLORIDE 5 MG PO TABS: ORAL_TABLET | ORAL | Status: DC

## 2015-05-18 NOTE — Telephone Encounter (Signed)
That's fine- will have to wait until next available for patient >65

## 2015-05-18 NOTE — Telephone Encounter (Signed)
Could not reach pt. No answering machine on home phone

## 2015-05-18 NOTE — Progress Notes (Signed)
Pre visit review using our clinic review tool, if applicable. No additional management support is needed unless otherwise documented below in the visit note. 

## 2015-05-18 NOTE — Patient Instructions (Signed)
Decrease the clonidine from 0.2 mg daily to 0.1 mg daily  Continue other medications  Check your blood pressure daily at home  Return in 4 weeks for follow-up......... when you return bring a record of all your blood pressure readings from home and the device that you're using to check your blood pressure  Walk 30 minutes daily outside

## 2015-05-18 NOTE — Progress Notes (Signed)
   Subjective:    Patient ID: Thomas [name], male    DOB: March 21, 1926, 81 y.o.   MRN: [id number]  HPI Thomas [name] is a 81 year old married male nonsmoker who comes in today for general physical examination because of a history of hypertension, reflux esophagitis, overactive bladder and a history of prostate cancer among other problems  He says overall he is doing well and has no complaints.  Medication reviewed the min no changes  Vaccinations updated by Apolonio Schneiders       Review of Systems  Constitutional: Negative.   HENT: Negative.   Eyes: Negative.   Respiratory: Negative.   Cardiovascular: Negative.   Gastrointestinal: Negative.   Endocrine: Negative.   Genitourinary: Negative.   Musculoskeletal: Negative.   Skin: Negative.   Allergic/Immunologic: Negative.   Neurological: Negative.   Hematological: Negative.   Psychiatric/Behavioral: Negative.        Objective:   Physical Exam  Constitutional: He is oriented to person, place, and time. He appears well-developed and well-nourished.  HENT:  Head: Normocephalic and atraumatic.  Right Ear: External ear normal.  Left Ear: External ear normal.  Nose: Nose normal.  Mouth/Throat: Oropharynx is clear and moist.  Eyes: Conjunctivae and EOM are normal. Pupils are equal, round, and reactive to light.  Neck: Normal range of motion. Neck supple. No JVD present. No tracheal deviation present. No thyromegaly present.  Cardiovascular: Normal rate, regular rhythm, normal heart sounds and intact distal pulses.  Exam reveals no gallop and no friction rub.   No murmur heard. Pulmonary/Chest: Effort normal and breath sounds normal. No stridor. No respiratory distress. He has no wheezes. He has no rales. He exhibits no tenderness.  Abdominal: Soft. Bowel sounds are normal. He exhibits no distension and no mass. There is no tenderness. There is no rebound and no guarding.  Genitourinary: Rectum normal and penis normal. Guaiac negative stool. No  penile tenderness.  Prostate surgically removed because of cancer. Stool guaiac-negative  Musculoskeletal: Normal range of motion. He exhibits no edema or tenderness.  Lymphadenopathy:    He has no cervical adenopathy.  Neurological: He is alert and oriented to person, place, and time. He has normal reflexes. No cranial nerve deficit. He exhibits normal muscle tone.  Skin: Skin is warm and dry. No rash noted. No erythema. No pallor.  Psychiatric: He has a normal mood and affect. His behavior is normal. Judgment and thought content normal.  Nursing note and vitals reviewed.         Assessment & Plan:   healthy male  Hypertension ........ BP too low decrease clonidine to 0.1 mg daily follow-up in 4 weeks  History reflux esophagitis .Marland Kitchen... Continue Prilosec  Overactive bladder Ditropan 5 mg daily when necessary

## 2015-05-18 NOTE — Telephone Encounter (Signed)
Patient would like to switch to Dr Hunter. 

## 2015-05-19 ENCOUNTER — Encounter: Payer: Self-pay | Admitting: Family

## 2015-05-21 ENCOUNTER — Ambulatory Visit: Payer: Medicare Other | Admitting: Family

## 2015-05-21 ENCOUNTER — Encounter (HOSPITAL_COMMUNITY): Payer: Medicare Other

## 2015-05-26 ENCOUNTER — Telehealth: Payer: Self-pay | Admitting: Family Medicine

## 2015-05-27 NOTE — Telephone Encounter (Signed)
[facility] Night - Client TELEPHONE ADVICE RECORD [facility] Patient Name: Thomas [name] Gender: Male DOB: 1925-09-06 Age: 81 Y 12 M 10 D Return Phone Number: [phone] (Primary), [phone] (Secondary) Address[place] [address] Client [facility] Night - Client Client Site [facility] - Night Physician [place], [place] Type Call Call Type Triage / Clinical Caller Name Sheriff Rodenberg Relationship To Patient Spouse Return Phone Number [phone] (Primary) Chief Complaint Blood Pressure High Initial Comment Caller states her husbands blood pressure was low, but now it's high. Most recent blood pressure is 190 over 112. Please call Tertiary ph listed [place] Not Listed wife will call for appt tomorrow or Friday PreDisposition Home Care Nurse Assessment Nurse: Venetia Maxon, RN, Manuela Schwartz Date/Time Eilene Ghazi Time): 05/26/2015 5:19:27 PM Confirm and document reason for call. If symptomatic, describe symptoms. ---Caller states her husbands blood pressure was low, but now it's high. Most recent blood pressure is 190 over 112. Please call Tertiary ph listed She states took it at [facility]. no headache or chest. pain. last office visit was 05/21/15 Has the patient traveled out of the country within the last 30 days? ---No Does the patient have any new or worsening symptoms? ---Yes Will a triage be completed? ---Yes Related visit to physician within the last 2 weeks? ---No Does the PT have any chronic conditions? (i.e. diabetes, asthma, etc.) ---Yes List chronic conditions. ---HTN at PCP office his B/ P was low Guidelines Guideline Title Affirmed Question Affirmed Notes Nurse Date/Time (Eastern Time) High Blood Pressure BP # 160/100 Venetia Maxon, RN, Manuela Schwartz 05/26/2015 5:23:13 PM Disp. Time Eilene Ghazi Time) Disposition Final User 05/26/2015 5:32:09 PM Send To RN Personal Venetia Maxon, RN,  Manuela Schwartz 05/26/2015 5:53:12 PM Call Completed Venetia Maxon, RN, Manuela Schwartz 05/26/2015 5:28:52 PM See PCP When Office is Open (within 3 days) Yes Venetia Maxon, RN, Liliane Bade NOTE: All timestamps contained within this report are represented as [place] Standard Time. CONFIDENTIALTY NOTICE: This fax transmission is intended only for the addressee. It contains information that is legally privileged, confidential or otherwise protected from use or disclosure. If you are not the intended recipient, you are strictly prohibited from reviewing, disclosing, copying using or disseminating any of this information or taking any action in reliance on or regarding this information. If you have received this fax in error, please notify us immediately by telephone so that we can arrange for its return to [place]. Phone: [phone], Toll-Free: [phone], Fax: [phone] Page: 2 of 2 Call Id: [id number] Caller Understands: Yes Disagree/Comply: Comply Care Advice Given Per Guideline SEE PCP WITHIN 3 DAYS: * DECREASE SODIUM INTAKE: Aim to eat less than

## 2015-05-27 NOTE — Telephone Encounter (Signed)
Pt has been sch for 05-31-15

## 2015-05-31 ENCOUNTER — Ambulatory Visit (INDEPENDENT_AMBULATORY_CARE_PROVIDER_SITE_OTHER): Payer: Medicare Other | Admitting: Family Medicine

## 2015-05-31 ENCOUNTER — Encounter: Payer: Self-pay | Admitting: Family Medicine

## 2015-05-31 VITALS — BP 140/80 | HR 66 | Temp 97.9°F | Wt 183.0 lb

## 2015-05-31 DIAGNOSIS — I10 Essential (primary) hypertension: Secondary | ICD-10-CM | POA: Diagnosis not present

## 2015-05-31 MED ORDER — HYDROCODONE-HOMATROPINE 5-1.5 MG/5ML PO SYRP: ORAL_SOLUTION | ORAL | Status: DC

## 2015-05-31 NOTE — Patient Instructions (Signed)
Clonidine 0.1 mg...........Marland Kitchen 1 daily in the morning  Verapamil 240 mg........Marland Kitchen 1 daily at bedtime  Check your blood pressure in the morning......Marland Kitchen Monday Wednesday Friday  Return in 2 months for follow-up  When you return bring a record of all your blood pressure readings and the device

## 2015-05-31 NOTE — Progress Notes (Signed)
Pre visit review using our clinic review tool, if applicable. No additional management support is needed unless otherwise documented below in the visit note. 

## 2015-05-31 NOTE — Progress Notes (Signed)
   Subjective:    Patient ID: Thomas [name], male    DOB: 28-Mar-1926, 81 y.o.   MRN: [id number]  HPI Thomas [name] is a 81 year old male who comes in today for follow-up of hypertension  We have been working with him over the last couple weeks because his blood pressure dropped too low. He was recently on Catapres 0.13 times daily. We've increased that to one tablet daily. We've left the verapamil alone at 240 mg daily. BP today 140/80 on his device and are   Review of Systems    negative Objective:   Physical Exam  Well-developed well-nourished male no acute distress vital signs stable he is afebrile specifically BP 140/80      Assessment & Plan:  Hypertension at goal.......... continue current therapy......... check blood pressure Monday Wednesday Friday follow-up in 2 months

## 2015-06-16 ENCOUNTER — Ambulatory Visit: Payer: Medicare Other | Admitting: Family Medicine

## 2015-08-04 ENCOUNTER — Encounter: Payer: Self-pay | Admitting: Family

## 2015-08-13 ENCOUNTER — Ambulatory Visit: Payer: Medicare Other | Admitting: Family

## 2015-08-13 ENCOUNTER — Inpatient Hospital Stay (HOSPITAL_COMMUNITY): Admission: RE | Admit: 2015-08-13 | Payer: Medicare Other | Source: Ambulatory Visit

## 2015-08-24 ENCOUNTER — Ambulatory Visit: Payer: Medicare Other | Admitting: Family Medicine

## 2015-09-01 ENCOUNTER — Ambulatory Visit (INDEPENDENT_AMBULATORY_CARE_PROVIDER_SITE_OTHER): Payer: Medicare Other | Admitting: Family Medicine

## 2015-09-01 VITALS — Temp 97.4°F | Wt 184.0 lb

## 2015-09-01 DIAGNOSIS — I10 Essential (primary) hypertension: Secondary | ICD-10-CM | POA: Diagnosis not present

## 2015-09-01 NOTE — Patient Instructions (Signed)
Clonidine 0.1...........Marland Kitchen 1 tablet daily in the morning   [name] 240 mg........Marland Kitchen 1 tablet  At bedtime  Check your blood pressure daily in the morning   Return in 4-6 weeks for follow-up   When you return bring a record of all your blood pressure readings and the device

## 2015-09-01 NOTE — Progress Notes (Signed)
   Subjective:    Patient ID: Thomas [name], male    DOB: 21-Oct-1925, 82 y.o.   MRN: [id number]  HPI  Thomas [name] is a 82 year old male who comes in today for follow-up of hypertension   we saw him in the fall 2016. We had him on clonidine 0.1 mg daily in the morning and Thomas [name] 240 mg at bedtime. He cut his medication in half. He is only taken half of the [name]. For reasons I don't understand. BP now is up to 160/110   Review of Systems  review of systems negative    Objective:   Physical Exam   well-developed well-nourished male no acute distress vital signs stable he is afebrile BP 160/100      Assessment & Plan:   hypertension not at goal......... Continue clonidine in the morning........... Take a full tablet of Thomas [name] as previously directed... BP check every morning.......... Follow-up in 4-6 weeks

## 2015-09-01 NOTE — Progress Notes (Signed)
Pre visit review using our clinic review tool, if applicable. No additional management support is needed unless otherwise documented below in the visit note. 

## 2015-09-03 ENCOUNTER — Encounter: Payer: Self-pay | Admitting: Family

## 2015-09-13 ENCOUNTER — Encounter: Payer: Self-pay | Admitting: Family

## 2015-09-13 ENCOUNTER — Ambulatory Visit (INDEPENDENT_AMBULATORY_CARE_PROVIDER_SITE_OTHER): Payer: Medicare Other | Admitting: Family

## 2015-09-13 ENCOUNTER — Ambulatory Visit (HOSPITAL_COMMUNITY)
Admission: RE | Admit: 2015-09-13 | Discharge: 2015-09-13 | Disposition: A | Discharge status: Home / Self Care | Payer: Medicare Other | Source: Ambulatory Visit | Attending: Family | Admitting: Family

## 2015-09-13 VITALS — BP 181/100 | HR 62 | Temp 97.7°F | Resp 20 | Ht 68.5 in | Wt 182.0 lb

## 2015-09-13 DIAGNOSIS — IMO0001 Reserved for inherently not codable concepts without codable children: Secondary | ICD-10-CM

## 2015-09-13 DIAGNOSIS — I6522 Occlusion and stenosis of left carotid artery: Secondary | ICD-10-CM

## 2015-09-13 DIAGNOSIS — I10 Essential (primary) hypertension: Secondary | ICD-10-CM | POA: Insufficient documentation

## 2015-09-13 DIAGNOSIS — Z9889 Other specified postprocedural states: Secondary | ICD-10-CM | POA: Diagnosis not present

## 2015-09-13 DIAGNOSIS — Z48812 Encounter for surgical aftercare following surgery on the circulatory system: Secondary | ICD-10-CM | POA: Insufficient documentation

## 2015-09-13 DIAGNOSIS — R030 Elevated blood-pressure reading, without diagnosis of hypertension: Secondary | ICD-10-CM

## 2015-09-13 NOTE — Progress Notes (Signed)
Filed Vitals:   09/13/15 1219 09/13/15 1220 09/13/15 1223  BP: 172/82 190/98 181/100  Pulse: 62    Temp: 97.7 F (36.5 C)    TempSrc: Oral    Resp: 20    Height: 5' 8.5" (1.74 m)    Weight: 182 lb (82.555 kg)

## 2015-09-13 NOTE — Progress Notes (Signed)
Chief Complaint: Extracranial Carotid Artery Stenosis   History of Present Illness  Thomas [name] is a 82 y.o. male patient of Dr. Bridgett Larsson who presents with chief complaint: routine surveillance. Previous carotid studies demonstrated: RICA [id number] stenosis, LICA: widely patent s/p CEA.   The patient denies any history of TIA or stroke symptoms, specifically the patient denies a history of amaurosis fugax or monocular blindness, denies a history unilateral  of facial drooping, denies a history of hemiplegia, and denies a history of receptive or expressive aphasia.   He denies any claudication symptoms with walking.  Pt denies chest pain, states he has been slightly dyspneic in the last couple of weeks, states he saw his PCP for this reason. Pt denies headaches or dizziness. The patient denies New Medical or Surgical History.  Pt Diabetic: no Pt smoker: former smoker, quit in the 1960's   Pt meds include: Statin : no ASA: yes Other anticoagulants/antiplatelets: no   Past Medical History  Diagnosis Date  . Hypertension   . Allergy   . ED (erectile dysfunction)   . Prostate cancer ([facility])   . Hiatal hernia   . GERD (gastroesophageal reflux disease)   . Esophageal stricture   . Unspecified gastritis and gastroduodenitis without mention of hemorrhage   . Adenomatous colon polyp 2009  . Diverticulosis of colon (without mention of hemorrhage)   . Esophagitis   . Peripheral vascular disease ([facility])   . Carotid artery occlusion     Social History Social History  Substance Use Topics  . Smoking status: Former Research scientist (life sciences)  . Smokeless tobacco: Never Used     Comment: Was 40 years ago  . Alcohol Use: Yes     Comment: daily--one alcohol drink a day    Family History Family History  Problem Relation Age of Onset  . Heart disease Father   . Cancer Mother   . Cancer Sister   . Hyperlipidemia Daughter   . Hypertension Daughter   . Hypertension Son     Surgical History Past  Surgical History  Procedure Laterality Date  . Esophagogastroduodenoscopy (egd) with esophageal dilation    . Prostate surgery      Laser surgery  . Endarterectomy Left 03/05/2013    Procedure: ENDARTERECTOMY CAROTID;  Surgeon: Conrad Benwood, MD;  Location: [facility];  Service: Vascular;  Laterality: Left;  . Carotid endarterectomy      Allergies  Allergen Reactions  . Ace Inhibitors Hives  . Penicillins Swelling    Swelling  of the face.    Current Outpatient Prescriptions  Medication Sig Dispense Refill  . aspirin 81 MG tablet Take 1 tablet (81 mg total) by mouth daily.    . cloNIDine (CATAPRES) 0.1 MG tablet Take 1 tablet (0.1 mg total) by mouth 3 (three) times daily. 90 tablet 3  . omeprazole (PRILOSEC) 40 MG capsule Take 1 capsule (40 mg total) by mouth daily. 90 capsule 4  . oxybutynin (DITROPAN) 5 MG tablet 1 tablet daily when necessary 100 tablet 3  . verapamil (CALAN-SR) 240 MG CR tablet Take 1 tablet (240 mg total) by mouth at bedtime. 100 tablet 3   No current facility-administered medications for this visit.    Review of Systems : See HPI for pertinent positives and negatives.  Physical Examination  Filed Vitals:   09/13/15 1219 09/13/15 1220 09/13/15 1223  BP: 172/82 190/98 181/100  Pulse: 62    Temp: 97.7 F (36.5 C)    TempSrc: Oral    Resp: 20  Height: 5' 8.5" (1.74 m)    Weight: 182 lb (82.555 kg)     Body mass index is 27.27 kg/(m^2).   General: A&O x 3, WDWN, advanced stage of dental decay of all teeth  Eyes: PERRLA  Neck: Supple, no nuchal rigidity  Pulmonary: Sym exp, good air movt in bilateral anterior and right posterior fields; no air movement in all left posterior fields.  Cardiac: RRR, Nl S1, S2, no detected murmur, rubs or gallops  Vascular: Vessel Right Left  Radial Palpable Palpable  Brachial Palpable Palpable  Carotid Palpable, without bruit Palpable, without bruit  Aorta Not palpable N/A  Popliteal Not palpable  Not palpable  PT Faintly Palpable Faintly Palpable  DP Faintly Palpable Faintly Palpable   Gastrointestinal: soft, NTND, -G/R, - HSM, - palpable masses, - CVAT B,   Musculoskeletal: M/S 5/5 throughout , Extremities without ischemic changes   Neurologic: CN 2-12 intact , Pain and light touch intact in extremities , Motor exam as listed above          Non-Invasive Vascular Imaging CAROTID DUPLEX 09/13/2015   Right ICA: 1 - 39 % stenosis. Left ICA: CEA site with no restenosis. No significant change compared to 05/15/14   Assessment: Thomas [name] is a 82 y.o. male who has no hx of stroke or TIA. Today's carotid duplex suggests minimal right ICA stenosis and no restenosis of left CEA ICA.   I advised pt and his family to call pt's PCP today and let his office know that his blood pressure is quite elevated now, and that no air movement can be auscultated in all left posterior fields. He denies cough, he states that he seen his PCP re his dyspnea recently.  Plan: Follow-up in 1 year with Carotid Duplex scan.   I discussed in depth with the patient the nature of atherosclerosis, and emphasized the importance of maximal medical management including strict control of blood pressure, blood glucose, and lipid levels, obtaining regular exercise, and continued cessation of smoking.  The patient is aware that without maximal medical management the underlying atherosclerotic disease process will progress, limiting the benefit of any interventions. The patient was given information about stroke prevention and what symptoms should prompt the patient to seek immediate medical care. Thank you for allowing [place] to participate in this patient's care.  Clemon Chambers, RN, MSN, FNP-C [facility] Office: [facility] Physician: Trula Slade  09/13/2015 12:19 PM

## 2015-09-13 NOTE — Patient Instructions (Signed)
Stroke Prevention Some medical conditions and behaviors are associated with an increased chance of having a stroke. You may prevent a stroke by making healthy choices and managing medical conditions. HOW CAN I REDUCE MY RISK OF HAVING A STROKE?   Stay physically active. Get at least 30 minutes of activity on most or all days.  Do not smoke. It may also be helpful to avoid exposure to secondhand smoke.  Limit alcohol use. Moderate alcohol use is considered to be:  No more than 2 drinks per day for men.  No more than 1 drink per day for nonpregnant women.  Eat healthy foods. This involves:  Eating 5 or more servings of fruits and vegetables a day.  Making dietary changes that address high blood pressure (hypertension), high cholesterol, diabetes, or obesity.  Manage your cholesterol levels.  Making food choices that are high in fiber and low in saturated fat, trans fat, and cholesterol may control cholesterol levels.  Take any prescribed medicines to control cholesterol as directed by your health care provider.  Manage your diabetes.  Controlling your carbohydrate and sugar intake is recommended to manage diabetes.  Take any prescribed medicines to control diabetes as directed by your health care provider.  Control your hypertension.  Making food choices that are low in salt (sodium), saturated fat, trans fat, and cholesterol is recommended to manage hypertension.  Ask your health care provider if you need treatment to lower your blood pressure. Take any prescribed medicines to control hypertension as directed by your health care provider.  If you are 18-39 years of age, have your blood pressure checked every 3-5 years. If you are 40 years of age or older, have your blood pressure checked every year.  Maintain a healthy weight.  Reducing calorie intake and making food choices that are low in sodium, saturated fat, trans fat, and cholesterol are recommended to manage  weight.  Stop drug abuse.  Avoid taking birth control pills.  Talk to your health care provider about the risks of taking birth control pills if you are over 35 years old, smoke, get migraines, or have ever had a blood clot.  Get evaluated for sleep disorders (sleep apnea).  Talk to your health care provider about getting a sleep evaluation if you snore a lot or have excessive sleepiness.  Take medicines only as directed by your health care provider.  For some people, aspirin or blood thinners (anticoagulants) are helpful in reducing the risk of forming abnormal blood clots that can lead to stroke. If you have the irregular heart rhythm of atrial fibrillation, you should be on a blood thinner unless there is a good reason you cannot take them.  Understand all your medicine instructions.  Make sure that other conditions (such as anemia or atherosclerosis) are addressed. SEEK IMMEDIATE MEDICAL CARE IF:   You have sudden weakness or numbness of the face, arm, or leg, especially on one side of the body.  Your face or eyelid droops to one side.  You have sudden confusion.  You have trouble speaking (aphasia) or understanding.  You have sudden trouble seeing in one or both eyes.  You have sudden trouble walking.  You have dizziness.  You have a loss of balance or coordination.  You have a sudden, severe headache with no known cause.  You have new chest pain or an irregular heartbeat. Any of these symptoms may represent a serious problem that is an emergency. Do not wait to see if the symptoms will   go away. Get medical help at once. Call your local emergency services (911 in [place] Do not drive yourself to the hospital.   This information is not intended to replace advice given to you by your health care provider. Make sure you discuss any questions you have with your health care provider.   Document Released: 09/07/2004 Document Revised: 08/21/2014 Document Reviewed:  01/31/2013 Elsevier Interactive Patient Education 2016 Elsevier Inc.  

## 2015-09-14 ENCOUNTER — Encounter (HOSPITAL_COMMUNITY): Payer: Medicare Other

## 2015-09-14 ENCOUNTER — Ambulatory Visit: Payer: Medicare Other | Admitting: Family

## 2015-09-14 NOTE — Addendum Note (Signed)
Addended by: Thresa Ross C on: 09/14/2015 10:06 AM   Modules accepted: Orders

## 2015-09-15 ENCOUNTER — Encounter: Payer: Self-pay | Admitting: Family Medicine

## 2015-09-15 ENCOUNTER — Other Ambulatory Visit: Payer: Self-pay | Admitting: *Deleted

## 2015-09-15 ENCOUNTER — Ambulatory Visit (INDEPENDENT_AMBULATORY_CARE_PROVIDER_SITE_OTHER): Payer: Medicare Other | Admitting: Family Medicine

## 2015-09-15 VITALS — BP 140/90 | Temp 98.2°F | Wt 187.0 lb

## 2015-09-15 DIAGNOSIS — I10 Essential (primary) hypertension: Secondary | ICD-10-CM | POA: Diagnosis not present

## 2015-09-15 MED ORDER — AMLODIPINE BESYLATE 2.5 MG PO TABS: 2.5000 mg | ORAL_TABLET | Freq: Every day | ORAL | Status: DC

## 2015-09-15 NOTE — Progress Notes (Signed)
Pre visit review using our clinic review tool, if applicable. No additional management support is needed unless otherwise documented below in the visit note. 

## 2015-09-15 NOTE — Progress Notes (Signed)
   Subjective:    Patient ID: Thomas [name], male    DOB: 17-Oct-1925, 82 y.o.   MRN: [id number]  HPI Thomas [name] is a 82 year old married male nonsmoker who comes in today company by his wife for reevaluation of hypertension  We most recently sent in to [facility]. He's had a history of left carotid surgery. On physical examination I can hear a bruit. Evaluation shows no significant restenosis. BP at that time was elevated. BP here today 170/9 8 right arm lying down  He tells me the nurse told him that he had no airflow in the back of his lungs. Are at or note the left which she said. However he doesn't have any pulmonary symptoms   Review of Systems Review of systems otherwise negative    Objective:   Physical Exam  Well-developed well-nourished male no acute distress vital signs stable he is afebrile lung exam is perfectly normal he has breath sounds normal breath sounds on both lung fields no wheezing  BP right SUPINE position 180/90      Assessment & Plan:  Hypertension not at goal......... add Norvasc 2.5 mg daily follow-up in one month

## 2015-09-15 NOTE — Patient Instructions (Signed)
Continue current blood pressure medication  Add Norvasc 2.5 mg.......... one tablet daily in the morning  Check your blood pressure daily in the morning  Return in 4 weeks for follow-up........Marland Kitchen bring a record of all your blood pressure readings and the device

## 2015-10-25 ENCOUNTER — Encounter: Payer: Self-pay | Admitting: Family Medicine

## 2015-10-25 ENCOUNTER — Ambulatory Visit (INDEPENDENT_AMBULATORY_CARE_PROVIDER_SITE_OTHER): Payer: Medicare Other | Admitting: Family Medicine

## 2015-10-25 VITALS — BP 140/90 | Temp 98.0°F | Wt 185.0 lb

## 2015-10-25 DIAGNOSIS — I10 Essential (primary) hypertension: Secondary | ICD-10-CM | POA: Diagnosis not present

## 2015-10-25 NOTE — Progress Notes (Signed)
   Subjective:    Patient ID: Kabe Arther, male    DOB: [birth year], 82 y.o.   MRN: [id number]  HPI  Mr. Lipuma is a 82 year old married male nonsmoker who comes in today for follow-up of hypertension  He was supposed to be on clonidine 1 tablet 3 times a day. But he is only taken it twice a day was missing his afternoon dose. We therefore changed and had him take 1 tablet in the morning and 2 at bedtime. This is increased his compliance. He also takes Norvasc 2.5 mg daily along with verapamil 240 mg daily. BP now is 140/90  Review of Systems    review of systems negative Objective:   Physical Exam Well-developed well-nourished male no acute distress vital signs stable he is afebrile       Assessment & Plan:  Hypertension at goal......... continue current therapy follow-up physical exam fall 2017

## 2015-10-25 NOTE — Patient Instructions (Signed)
Continue current medications  Call in August to get set up for your physical examination in October,,,,,,,,, Tommi Rumps or Almyra Free are 2 new adult nurse practitioner's or Dr. Martinique

## 2015-10-25 NOTE — Progress Notes (Signed)
Pre visit review using our clinic review tool, if applicable. No additional management support is needed unless otherwise documented below in the visit note. 

## 2016-02-07 ENCOUNTER — Ambulatory Visit (INDEPENDENT_AMBULATORY_CARE_PROVIDER_SITE_OTHER): Payer: Medicare Other | Admitting: Family Medicine

## 2016-02-07 ENCOUNTER — Encounter: Payer: Self-pay | Admitting: Family Medicine

## 2016-02-07 ENCOUNTER — Ambulatory Visit (INDEPENDENT_AMBULATORY_CARE_PROVIDER_SITE_OTHER)
Admission: RE | Admit: 2016-02-07 | Discharge: 2016-02-07 | Disposition: A | Discharge status: Home / Self Care | Payer: Medicare Other | Source: Ambulatory Visit | Attending: Family Medicine | Admitting: Family Medicine

## 2016-02-07 VITALS — BP 140/90 | HR 81 | Temp 98.3°F | Ht 68.5 in | Wt 183.2 lb

## 2016-02-07 DIAGNOSIS — R05 Cough: Secondary | ICD-10-CM

## 2016-02-07 DIAGNOSIS — R059 Cough, unspecified: Secondary | ICD-10-CM

## 2016-02-07 MED ORDER — BENZONATATE 100 MG PO CAPS: 100.0000 mg | ORAL_CAPSULE | Freq: Three times a day (TID) | ORAL | Status: DC

## 2016-02-07 MED ORDER — AZITHROMYCIN 250 MG PO TABS: ORAL_TABLET | ORAL | Status: DC

## 2016-02-07 NOTE — Patient Instructions (Signed)
Please go to [facility] for your X-ray. Also, an antibiotic has been prescribed for you with a medication for your cough.  If symptoms do not improve, worsen, or you develop a fever >101, please follow up for further evaluation and treatment.

## 2016-02-07 NOTE — Progress Notes (Signed)
Pre visit review using our clinic review tool, if applicable. No additional management support is needed unless otherwise documented below in the visit note. 

## 2016-02-07 NOTE — Progress Notes (Addendum)
Subjective:    Patient ID: Thomas [name], male    DOB: April 09, 1926, 82 y.o.   MRN: [id number]  HPI  Thomas [name] is an 82 year old male who presents today with a cough that has been present for 2 to 3 weeks. Cough is productive occasionally with clear/yellow sputum . Denies fever, chills, sweats, sinus pressure/pain, ear pain, tooth pain, N/V/D, SOB, symptoms of GERD, and dyspnea. He is not taking an ACE I.  Cough is worse at night and last night he reports one episode of SOB with coughing that required him to sit up in bed for his "coughing spell" and "breath" to improve. Pertinent history of HTN, GERD, stricture and stenosis of esophagus,esophageal dysphagia, and seasonal allergies.  No history of asthma/bronchitis. He is a nonsmoker. No treatments have been tried at home.   Review of Systems  Constitutional: Negative for fever and chills.  HENT: Negative for congestion, ear pain, postnasal drip, rhinorrhea, sinus pressure, sneezing and sore throat.   Respiratory: Positive for cough and shortness of breath.   Cardiovascular: Negative for chest pain and palpitations.  Gastrointestinal: Negative for nausea, vomiting, abdominal pain and diarrhea.  Musculoskeletal: Negative for myalgias.  Skin: Negative for rash.  Neurological: Negative for dizziness and headaches.   Past Medical History  Diagnosis Date  . Hypertension   . Allergy   . ED (erectile dysfunction)   . Prostate cancer ([place])   . Hiatal hernia   . GERD (gastroesophageal reflux disease)   . Esophageal stricture   . Unspecified gastritis and gastroduodenitis without mention of hemorrhage   . Adenomatous colon polyp 2009  . Diverticulosis of colon (without mention of hemorrhage)   . Esophagitis   . Peripheral vascular disease ([place])   . Carotid artery occlusion      Social History   Social History  . Marital Status: Married    Spouse Name: N/A  . Number of Children: N/A  . Years of Education: N/A   Occupational  History  . Not on file.   Social History Main Topics  . Smoking status: Former Research scientist (life sciences)  . Smokeless tobacco: Never Used     Comment: Was 40 years ago  . Alcohol Use: Yes     Comment: daily--one alcohol drink a day  . Drug Use: No  . Sexual Activity: Not on file   Other Topics Concern  . Not on file   Social History Narrative    Past Surgical History  Procedure Laterality Date  . Esophagogastroduodenoscopy (egd) with esophageal dilation    . Prostate surgery      Laser surgery  . Endarterectomy Left 03/05/2013    Procedure: ENDARTERECTOMY CAROTID;  Surgeon: Conrad Mammoth, MD;  Location: [facility];  Service: Vascular;  Laterality: Left;  . Carotid endarterectomy      Family History  Problem Relation Age of Onset  . Heart disease Father   . Cancer Mother   . Cancer Sister   . Hyperlipidemia Daughter   . Hypertension Daughter   . Hypertension Son     Allergies  Allergen Reactions  . Ace Inhibitors Hives  . Penicillins Swelling    Swelling  of the face.    Current Outpatient Prescriptions on File Prior to Visit  Medication Sig Dispense Refill  . amLODipine (NORVASC) 2.5 MG tablet Take 1 tablet (2.5 mg total) by mouth daily. 90 tablet 3  . aspirin 81 MG tablet Take 1 tablet (81 mg total) by mouth daily.    Marland Kitchen  cloNIDine (CATAPRES) 0.1 MG tablet Take 1 tablet (0.1 mg total) by mouth 3 (three) times daily. 90 tablet 3  . omeprazole (PRILOSEC) 40 MG capsule Take 1 capsule (40 mg total) by mouth daily. 90 capsule 4  . oxybutynin (DITROPAN) 5 MG tablet 1 tablet daily when necessary 100 tablet 3  . verapamil (CALAN-SR) 240 MG CR tablet Take 1 tablet (240 mg total) by mouth at bedtime. 100 tablet 3   No current facility-administered medications on file prior to visit.    BP 140/90 mmHg  Pulse 81  Temp(Src) 98.3 F (36.8 C) (Oral)  Ht 5' 8.5" (1.74 m)  Wt 183 lb 3 oz (83.093 kg)  BMI 27.45 kg/m2  SpO2 94%        Objective:   Physical Exam  Constitutional: He appears  well-developed and well-nourished.  HENT:  Right Ear: Tympanic membrane normal.  Left Ear: Tympanic membrane normal.  Nose: No rhinorrhea. Right sinus exhibits no maxillary sinus tenderness and no frontal sinus tenderness. Left sinus exhibits no maxillary sinus tenderness and no frontal sinus tenderness.  Mouth/Throat: Mucous membranes are normal. No oropharyngeal exudate or posterior oropharyngeal erythema.  Eyes: Pupils are equal, round, and reactive to light. No scleral icterus.  Cardiovascular: Normal rate, regular rhythm and intact distal pulses.   Pulmonary/Chest: Effort normal. No respiratory distress.  Rhonchi and rales noted in left lower lobe.   Musculoskeletal: He exhibits no edema.  Lymphadenopathy:    He has cervical adenopathy.  Skin: Skin is warm and dry. No rash noted.        Assessment & Plan:  1. Cough Symptoms of cough and episode of SOB that was present last night support empiric antibiotic therapy. Patient does not appear acutely ill and has not had recent antibiotic therapy. Chest X-ray will be obtained to rule out pneumonia. Advised patient that he will be notified of the results of his chest X-ray, however he should begin antibiotic therapy today. Further advised patient to follow up for evaluation and treatment if symptoms do not improve in 2 to 3 days, worsen, or he develops a fever >101. Patient and wife voiced understanding and agreed with plan.  - azithromycin (ZITHROMAX) 250 MG tablet; Take 2 tablets by mouth at once today and one tablet by mouth daily for 4 days.  Dispense: 6 tablet; Refill: 0 - DG Chest 2 View; Future - benzonatate (TESSALON) 100 MG capsule; Take 1 capsule (100 mg total) by mouth 3 (three) times daily.  Dispense: 20 capsule; Refill: 0  Delano Metz, FNP-C

## 2016-02-08 ENCOUNTER — Telehealth: Payer: Self-pay | Admitting: Family Medicine

## 2016-02-08 ENCOUNTER — Other Ambulatory Visit: Payer: Self-pay | Admitting: Family Medicine

## 2016-02-08 DIAGNOSIS — J90 Pleural effusion, not elsewhere classified: Secondary | ICD-10-CM

## 2016-02-08 DIAGNOSIS — I10 Essential (primary) hypertension: Secondary | ICD-10-CM

## 2016-02-08 NOTE — Telephone Encounter (Signed)
Pleural effusion found on X-ray. Chest CT ordered today. Patient will be notified by Hilda Blades for time of scan.

## 2016-02-08 NOTE — Telephone Encounter (Signed)
Called patient. Spoke to spouse. Patient hard of hearing on phone. Explained per NP-Julia Kordsmeier: Xray showed some fluid around his lung. This may be due to pneumonia however a CT scan is needed for further evaluation.Advised spouse to have him  to complete his antibiotic that was provided. Spouse already notified of CT scan time and instructions. Patient is to come in to office to have BMET and BUN labs before CT appt on 02/10/16. Advised would have scheduler call to place patient on lab schedule. Spouse verbalized understanding. Ordered labs.

## 2016-02-08 NOTE — Telephone Encounter (Signed)
Also pt need results of his xray  -pt aware of appt  Per [facility] CT - pt need labs prior to his appt ,  Pt scheduled for 02/10/2016@3 :[facility] 3rd floor  Address: [address], [place], [place] [address]  [phone]

## 2016-02-09 ENCOUNTER — Other Ambulatory Visit (INDEPENDENT_AMBULATORY_CARE_PROVIDER_SITE_OTHER): Payer: Medicare Other

## 2016-02-09 DIAGNOSIS — I10 Essential (primary) hypertension: Secondary | ICD-10-CM | POA: Diagnosis not present

## 2016-02-09 LAB — BASIC METABOLIC PANEL
BUN: 13 mg/dL (ref 6–23)
CHLORIDE: 101 meq/L (ref 96–112)
CO2: 28 meq/L (ref 19–32)
Calcium: 9.1 mg/dL (ref 8.4–10.5)
Creatinine, Ser: 1.29 mg/dL (ref 0.40–1.50)
GFR: 67.55 mL/min (ref >60.00)
Glucose, Bld: 96 mg/dL (ref 70–99)
POTASSIUM: 4 meq/L (ref 3.5–5.1)
Sodium: 136 mEq/L (ref 135–145)

## 2016-02-09 LAB — BUN: BUN: 13 mg/dL (ref 6–23)

## 2016-02-10 ENCOUNTER — Ambulatory Visit (INDEPENDENT_AMBULATORY_CARE_PROVIDER_SITE_OTHER)
Admission: RE | Admit: 2016-02-10 | Discharge: 2016-02-10 | Disposition: A | Discharge status: Home / Self Care | Payer: Medicare Other | Source: Ambulatory Visit | Attending: Family Medicine | Admitting: Family Medicine

## 2016-02-10 DIAGNOSIS — J90 Pleural effusion, not elsewhere classified: Secondary | ICD-10-CM | POA: Diagnosis not present

## 2016-02-10 MED ORDER — IOPAMIDOL (ISOVUE-300) INJECTION 61%
100.0000 mL | Freq: Once | INTRAVENOUS | Status: AC | PRN
  Administered 2016-02-10: 80 mL via INTRAVENOUS

## 2016-02-14 ENCOUNTER — Telehealth: Payer: Self-pay | Admitting: Family Medicine

## 2016-02-14 ENCOUNTER — Other Ambulatory Visit: Payer: Self-pay | Admitting: Family Medicine

## 2016-02-14 DIAGNOSIS — J90 Pleural effusion, not elsewhere classified: Secondary | ICD-10-CM

## 2016-02-14 NOTE — Telephone Encounter (Signed)
FYI  Pt scheduled for 02-15-2014@9 :15 Dr wert --pt aware informed pt of scheduled appt  [place]: pulmonary 2nd floor  Address: [address], [place], [place] [address]  [phone]

## 2016-02-16 ENCOUNTER — Other Ambulatory Visit (INDEPENDENT_AMBULATORY_CARE_PROVIDER_SITE_OTHER): Payer: Medicare Other

## 2016-02-16 ENCOUNTER — Encounter (INDEPENDENT_AMBULATORY_CARE_PROVIDER_SITE_OTHER): Payer: Self-pay

## 2016-02-16 ENCOUNTER — Ambulatory Visit (INDEPENDENT_AMBULATORY_CARE_PROVIDER_SITE_OTHER): Payer: Medicare Other | Admitting: Internal Medicine

## 2016-02-16 ENCOUNTER — Encounter: Payer: Self-pay | Admitting: Internal Medicine

## 2016-02-16 VITALS — BP 142/90 | HR 70 | Ht 71.0 in | Wt 181.0 lb

## 2016-02-16 DIAGNOSIS — J90 Pleural effusion, not elsewhere classified: Secondary | ICD-10-CM

## 2016-02-16 DIAGNOSIS — J948 Other specified pleural conditions: Secondary | ICD-10-CM

## 2016-02-16 DIAGNOSIS — R0600 Dyspnea, unspecified: Secondary | ICD-10-CM

## 2016-02-16 DIAGNOSIS — C450 Mesothelioma of pleura: Secondary | ICD-10-CM | POA: Insufficient documentation

## 2016-02-16 LAB — HEPATIC FUNCTION PANEL
ALBUMIN: 4 g/dL (ref 3.5–5.2)
ALK PHOS: 65 U/L (ref 39–117)
ALT: 12 U/L (ref 0–53)
AST: 15 U/L (ref 0–37)
BILIRUBIN DIRECT: 0.1 mg/dL (ref 0.0–0.3)
BILIRUBIN TOTAL: 0.6 mg/dL (ref 0.2–1.2)
Total Protein: 7.8 g/dL (ref 6.0–8.3)

## 2016-02-16 LAB — CBC WITH DIFFERENTIAL/PLATELET
BASOS ABS: 0 10*3/uL (ref 0.0–0.1)
Basophils Relative: 0.1 % (ref 0.0–3.0)
Eosinophils Absolute: 0 10*3/uL (ref 0.0–0.7)
Eosinophils Relative: 0.8 % (ref 0.0–5.0)
HCT: 40 % (ref 39.0–52.0)
Hemoglobin: 13.7 g/dL (ref 13.0–17.0)
LYMPHS ABS: 0.9 10*3/uL (ref 0.7–4.0)
Lymphocytes Relative: 16.2 % (ref 12.0–46.0)
MCHC: 34.1 g/dL (ref 30.0–36.0)
MCV: 93.7 fl (ref 78.0–100.0)
MONO ABS: 0.9 10*3/uL (ref 0.1–1.0)
MONOS PCT: 16.4 % — AB (ref 3.0–12.0)
NEUTROS PCT: 66.5 % (ref 43.0–77.0)
Neutro Abs: 3.5 10*3/uL (ref 1.4–7.7)
Platelets: 265 10*3/uL (ref 150.0–400.0)
RBC: 4.27 Mil/uL (ref 4.22–5.81)
RDW: 14.1 % (ref 11.5–15.5)
WBC: 5.3 10*3/uL (ref 4.0–10.5)

## 2016-02-16 LAB — TSH: TSH: 4.14 u[IU]/mL (ref 0.35–4.50)

## 2016-02-16 LAB — BASIC METABOLIC PANEL
BUN: 15 mg/dL (ref 6–23)
CALCIUM: 9.5 mg/dL (ref 8.4–10.5)
CHLORIDE: 100 meq/L (ref 96–112)
CO2: 30 meq/L (ref 19–32)
CREATININE: 1.33 mg/dL (ref 0.40–1.50)
GFR: 65.21 mL/min (ref >60.00)
Glucose, Bld: 109 mg/dL — ABNORMAL HIGH (ref 70–99)
Potassium: 4.1 mEq/L (ref 3.5–5.1)
Sodium: 136 mEq/L (ref 135–145)

## 2016-02-16 LAB — BRAIN NATRIURETIC PEPTIDE: PRO B NATRI PEPTIDE: 69 pg/mL (ref 0.0–100.0)

## 2016-02-16 LAB — SEDIMENTATION RATE: SED RATE: 37 mm/h — AB (ref 0–20)

## 2016-02-16 NOTE — Progress Notes (Signed)
Subjective:    Patient ID: Thomas [name], male    DOB: [birth year], 82 y.o.   MRN: [id number]  HPI   7 yobm never smoker with hbp otherwise healthy with new onset cough  around 1st June 2017 followed by orthopnea a fews later with dx of L effusion on cxr 02/06/16 referred to pulmonary clinic 02/16/2016 by Dr Sherren Mocha Almyra Free PA   02/16/2016 1st [facility] office visit/ Jacari Kirsten  Re new L effusion Chief Complaint  Patient presents with  . Pulmonary Consult    Referred by Dr. Noah Charon for eval of pleural effusion. Pt c/o non prod cough and DOE x 2 wks.   onset was insidious x 5 weeks initially with cough then orthopnea but this improved on diuretics despite absence of pedal edema  Does have h/o serving in [employer] including [place] but denies knowledge of asbestos exp. Worked in sonar room   No obvious day to day or daytime variability or assoc excess/ purulent sputum or mucus plugs or hemoptysis or cp or chest tightness, subjective wheeze or overt sinus or hb symptoms. No unusual exp hx or h/o childhood pna/ asthma or knowledge of premature birth.  Sleeping ok without nocturnal  or early am exacerbation  of respiratory  c/o's or need for noct saba. Also denies any obvious fluctuation of symptoms with weather or environmental changes or other aggravating or alleviating factors except as outlined above   Current Medications, Allergies, Complete Past Medical History, Past Surgical History, Family History, and Social History were reviewed in [facility] record.          Review of Systems  Constitutional: Negative for fever, chills, activity change, appetite change and unexpected weight change.  HENT: Positive for dental problem. Negative for congestion, postnasal drip, rhinorrhea, sneezing, sore throat, trouble swallowing and voice change.   Eyes: Negative for visual disturbance.  Respiratory: Positive for cough and shortness of breath. Negative for choking.     Cardiovascular: Negative for chest pain and leg swelling.  Gastrointestinal: Negative for nausea, vomiting and abdominal pain.  Genitourinary: Negative for difficulty urinating.  Musculoskeletal: Negative for arthralgias.  Skin: Negative for rash.  Psychiatric/Behavioral: Negative for behavioral problems and confusion.       Objective:   Physical Exam Stoic amb bm nad  Wt Readings from Last 3 Encounters:  02/16/16 181 lb (82.101 kg)  02/07/16 183 lb 3 oz (83.093 kg)  10/25/15 185 lb (83.915 kg)    Vital signs reviewed  HEENT: nl dentition, turbinates, and oropharynx. Nl external ear canals without cough reflex   NECK :  without JVD/Nodes/TM/ nl carotid upstrokes bilaterally   LUNGS: no acc muscle use,  Nl contour chest decreased bs/ dullness L bottom third s localized or gen wheeze  CV:  RRR  no s3 or murmur or increase in P2, no edema   ABD:  soft and nontender with nl inspiratory excursion in the supine position. No bruits or organomegaly, bowel sounds nl  MS:  Nl gait/ ext warm without deformities, calf tenderness, cyanosis or clubbing No obvious joint restrictions   SKIN: warm and dry without lesions    NEURO:  alert, approp, nl sensorium with  no motor deficits        I personally reviewed images and agree with radiology impression as follows:  CT Chest   02/10/16 Moderate to large left pleural effusion with left pleural enhancement - malignant effusion is not excluded. Associated moderate -severe left mid and lower lung atelectasis.  Cardiomegaly and coronary artery disease.  Aortic atherosclerosis.     Labs ordered/ reviewed:      Chemistry      Component Value Date/Time   NA 136 02/16/2016 1003   K 4.1 02/16/2016 1003   CL 100 02/16/2016 1003   CO2 30 02/16/2016 1003   BUN 15 02/16/2016 1003   CREATININE 1.33 02/16/2016 1003      Component Value Date/Time   CALCIUM 9.5 02/16/2016 1003   ALKPHOS 65 02/16/2016 1003   AST 15 02/16/2016 1003    ALT 12 02/16/2016 1003   BILITOT 0.6 02/16/2016 1003                                                                                             Alb            4.0       02/16/2016    Lab Results  Component Value Date   WBC 5.3 02/16/2016   HGB 13.7 02/16/2016   HCT 40.0 02/16/2016   MCV 93.7 02/16/2016   PLT 265.0 02/16/2016       Lab Results  Component Value Date   TSH 4.14 02/16/2016     Lab Results  Component Value Date   PROBNP 69.0 02/16/2016       Lab Results  Component Value Date   ESRSEDRATE 37* 02/16/2016   ESRSEDRATE 57* 10/12/2011           Assessment & Plan:

## 2016-02-16 NOTE — Patient Instructions (Signed)
Please see patient coordinator before you leave today  to schedule Left thoracentesis > I will call for follow up when all the results are back   Please remember to go to the lab department downstairs for your tests - we will call you with the results when they are available.

## 2016-02-17 LAB — LACTATE DEHYDROGENASE, ISOENZYMES
LD1/LD2 RATIO: 0.59
LDH 1: 18 % — ABNORMAL LOW (ref 19–38)
LDH 2: 31 % (ref 30–43)
LDH 3: 21 % (ref 16–26)
LDH 4: 10 % (ref 3–12)
LDH 5: 19 % — AB (ref 3–14)
LDH Isoenzymes, Total: 122 U/L (ref 120–250)

## 2016-02-17 NOTE — Assessment & Plan Note (Addendum)
Onset of symptoms around January 13 2016  Initial  studies are non specif and insidious onset isolated L effusion in never smoker with asbestos exp likely (but not def exposed)  is worrisome for mesothelioma but first step is dx / therapeutic thoracentesis as pt reports better from diuresis which would be unusual in any form of malignant effusion but certainly can be seen in transudative and sometimes with exudative processes, if only temporarily.  Discussed in detail all the  indications, usual  risks and alternatives  relative to the benefits with patient who agrees to proceed with L tcentesis   Total time devoted to counseling  = 35/65m review case with pt/multiple fm members discussion of options/alternatives/ personally creating written instructions  in presence of pt  then going over those specific  Instructions directly with the pt including how to use all of the meds but in particular covering each new medication in detail and the difference between the maintenance/automatic meds and the prns using an action plan format for the latter.

## 2016-02-17 NOTE — Assessment & Plan Note (Signed)
No evidence  Chf/ low alb,hypothyroidism or low alb contributing to symptoms/ nor significant renal insufficiency

## 2016-02-17 NOTE — Progress Notes (Signed)
Quick Note:  Spoke with pt and notified of results per Dr. Wert. Pt verbalized understanding and denied any questions.  ______ 

## 2016-02-21 ENCOUNTER — Ambulatory Visit (HOSPITAL_COMMUNITY)
Admission: RE | Admit: 2016-02-21 | Discharge: 2016-02-21 | Disposition: A | Discharge status: Home / Self Care | Payer: Medicare Other | Source: Ambulatory Visit | Attending: Internal Medicine | Admitting: Internal Medicine

## 2016-02-21 ENCOUNTER — Ambulatory Visit (HOSPITAL_COMMUNITY)
Admission: RE | Admit: 2016-02-21 | Discharge: 2016-02-21 | Disposition: A | Discharge status: Home / Self Care | Payer: Medicare Other | Source: Ambulatory Visit | Attending: Radiology | Admitting: Radiology

## 2016-02-21 DIAGNOSIS — Z9889 Other specified postprocedural states: Secondary | ICD-10-CM | POA: Insufficient documentation

## 2016-02-21 DIAGNOSIS — J948 Other specified pleural conditions: Secondary | ICD-10-CM | POA: Diagnosis present

## 2016-02-21 DIAGNOSIS — R896 Abnormal cytological findings in specimens from other organs, systems and tissues: Secondary | ICD-10-CM | POA: Insufficient documentation

## 2016-02-21 DIAGNOSIS — J90 Pleural effusion, not elsewhere classified: Secondary | ICD-10-CM

## 2016-02-21 LAB — BODY FLUID CELL COUNT WITH DIFFERENTIAL
Eos, Fluid: 0 %
LYMPHS FL: 59 %
MONOCYTE-MACROPHAGE-SEROUS FLUID: 41 % — AB (ref 50–90)
Neutrophil Count, Fluid: 0 % (ref 0–25)
Total Nucleated Cell Count, Fluid: 1655 cu mm — ABNORMAL HIGH (ref 0–1000)

## 2016-02-21 LAB — GLUCOSE, SEROUS FLUID: GLUCOSE FL: 88 mg/dL

## 2016-02-21 LAB — LACTATE DEHYDROGENASE, PLEURAL OR PERITONEAL FLUID: LD, Fluid: 156 U/L — ABNORMAL HIGH (ref 3–23)

## 2016-02-21 LAB — PROTEIN, BODY FLUID: TOTAL PROTEIN, FLUID: 4.1 g/dL

## 2016-02-21 NOTE — Procedures (Signed)
Ultrasound-guided diagnostic and therapeutic left  thoracentesis performed yielding 1.5 liters of hazy, yellow fluid. No immediate complications. Follow-up chest x-ray pending.The fluid was sent to the lab for preordered studies.

## 2016-02-22 LAB — TRIGLYCERIDES, BODY FLUIDS: TRIGLYCERIDES FL: 23 mg/dL

## 2016-02-23 NOTE — Progress Notes (Signed)
Quick Note:  Spoke with pt and notified of results per Dr. Wert. Pt verbalized understanding and denied any questions.  ______ 

## 2016-03-08 ENCOUNTER — Ambulatory Visit (INDEPENDENT_AMBULATORY_CARE_PROVIDER_SITE_OTHER): Payer: Medicare Other | Admitting: Internal Medicine

## 2016-03-08 ENCOUNTER — Ambulatory Visit (INDEPENDENT_AMBULATORY_CARE_PROVIDER_SITE_OTHER)
Admission: RE | Admit: 2016-03-08 | Discharge: 2016-03-08 | Disposition: A | Discharge status: Home / Self Care | Payer: Medicare Other | Source: Ambulatory Visit | Attending: Internal Medicine | Admitting: Internal Medicine

## 2016-03-08 ENCOUNTER — Encounter: Payer: Self-pay | Admitting: Internal Medicine

## 2016-03-08 VITALS — BP 124/80 | HR 70 | Ht 71.0 in | Wt 180.0 lb

## 2016-03-08 DIAGNOSIS — J90 Pleural effusion, not elsewhere classified: Secondary | ICD-10-CM

## 2016-03-08 DIAGNOSIS — J948 Other specified pleural conditions: Secondary | ICD-10-CM

## 2016-03-08 NOTE — Progress Notes (Signed)
Subjective:    Patient ID: Thomas [name], male    DOB: 1925/11/21, 82 y.o.   MRN: [id number]  HPI   46 yobm never smoker with hbp otherwise healthy with new onset cough  around 1st June 2017 followed by orthopnea a fews later with dx of L effusion on cxr 02/06/16 referred to pulmonary clinic 02/16/2016 by Dr Thomas [name]   02/16/2016 1st [facility] office visit/ Thomas [name]  Re new L effusion Chief Complaint  Patient presents with  . Pulmonary Consult    Referred by Dr. Noah [name] for eval of pleural effusion. Pt c/o non prod cough and DOE x 2 wks.   onset was insidious x 5 weeks prior to ov initially with cough then orthopnea but this improved on diuretics despite absence of pedal edema  Does have h/o serving in [employer] including [place] but denies knowledge of asbestos exp. Worked in sonar room  rec - L tcentesis  02/21/2016 : 1.5 liters of hazy, yellow fluid >  Exudate/ wbc 1655 L >>P with cyt atypical worrisome for adenoca    03/08/2016  f/u ov/[facility] [place] re: L effusion  Chief Complaint  Patient presents with  . Follow-up    SOB and cough are much improved. No new co's today.    able lie back normally / cough is min/ clear  No obvious day to day or daytime variability or assoc excess/ purulent sputum or mucus plugs or hemoptysis or cp or chest tightness, subjective wheeze or overt sinus or hb symptoms. No unusual exp hx or h/o childhood pna/ asthma or knowledge of premature birth.  Sleeping ok without nocturnal  or early am exacerbation  of respiratory  c/o's or need for noct saba. Also denies any obvious fluctuation of symptoms with weather or environmental changes or other aggravating or alleviating factors except as outlined above   Current Medications, Allergies, Complete Past Medical History, Past Surgical History, Family History, and Social History were reviewed in [employer] record.  ROS  The following are not active complaints unless  bolded sore throat, dysphagia, dental problems, itching, sneezing,  nasal congestion or excess/ purulent secretions, ear ache,   fever, chills, sweats, unintended wt loss, classically pleuritic or exertional cp,  orthopnea pnd or leg swelling, presyncope, palpitations, abdominal pain, anorexia, nausea, vomiting, diarrhea  or change in bowel or bladder habits, change in stools or urine, dysuria,hematuria,  rash, arthralgias, visual complaints, headache, numbness, weakness or ataxia or problems with walking or coordination,  change in mood/affect or memory.                         Objective:   Physical Exam Stoic amb bm nad mild pseudowheeze  03/08/2016        180  02/16/16 181 lb (82.101 kg)  02/07/16 183 lb 3 oz (83.093 kg)  10/25/15 185 lb (83.915 kg)    Vital signs reviewed  HEENT: nl dentition, turbinates, and oropharynx. Nl external ear canals without cough reflex   NECK :  without JVD/Nodes/TM/ nl carotid upstrokes bilaterally   LUNGS: no acc muscle use,  Nl contour chest decreased bs/ dullness L post at base s localized or gen wheeze  CV:  RRR  no s3 or murmur or increase in P2, no edema   ABD:  soft and nontender with nl inspiratory excursion in the supine position. No bruits or organomegaly, bowel sounds nl  MS:  Nl gait/ ext warm without deformities, calf  tenderness, cyanosis or clubbing No obvious joint restrictions   SKIN: warm and dry without lesions    NEURO:  alert, approp, nl sensorium with  no motor deficits        CXR [name] and Lateral:   03/08/2016 :    I personally reviewed images and agree with radiology impression as follows:    Reaccumulation of the moderate to large left pleural effusion.      Labs ordered/ reviewed:      Chemistry      Component Value Date/Time   NA 136 02/16/2016 1003   K 4.1 02/16/2016 1003   CL 100 02/16/2016 1003   CO2 30 02/16/2016 1003   BUN 15 02/16/2016 1003   CREATININE 1.33 02/16/2016 1003      Component  Value Date/Time   CALCIUM 9.5 02/16/2016 1003   ALKPHOS 65 02/16/2016 1003   AST 15 02/16/2016 1003   ALT 12 02/16/2016 1003   BILITOT 0.6 02/16/2016 1003                                                                                             Alb            4.0       02/16/2016    Lab Results  Component Value Date   WBC 5.3 02/16/2016   HGB 13.7 02/16/2016   HCT 40.0 02/16/2016   MCV 93.7 02/16/2016   PLT 265.0 02/16/2016       Lab Results  Component Value Date   TSH 4.14 02/16/2016     Lab Results  Component Value Date   PROBNP 69.0 02/16/2016       Lab Results  Component Value Date   ESRSEDRATE 37* 02/16/2016   ESRSEDRATE 57* 10/12/2011           Assessment & Plan:

## 2016-03-08 NOTE — Patient Instructions (Signed)
Call me when beginning to have problem lying back comfortably and we will repeat the L thoracentesis at that point

## 2016-03-14 NOTE — Assessment & Plan Note (Signed)
Onset of symptoms around January 13 2016 - L tcentesis  02/21/2016 : 1.5 liters of hazy, yellow fluid >  Exudate/ wbc 1655 L >>P with cyt atypical worrisome for adenoca - cxr 03/08/2016 reaccumulating by asymptomatic > pt elected to defer further eval until symptoms of worse orthopnea or doe  I had an extended discussion with the patient reviewing all relevant studies completed to date and  lasting 15 to 20 minutes of a 25 minute visit on the following ongoing concerns:   Discussed in detail all the  indications, usual  risks and alternatives (referral for vats now and/or PET now)  relative to the benefits with patient who agrees to proceed with conservative f/u as outlined   Each maintenance medication was reviewed in detail including most importantly the difference between maintenance and as needed and under what circumstances the prns are to be used.  Please see instructions for details which were reviewed in writing and the patient given a copy.

## 2016-05-23 ENCOUNTER — Other Ambulatory Visit: Payer: Self-pay | Admitting: Family Medicine

## 2016-05-29 ENCOUNTER — Encounter: Payer: Self-pay | Admitting: Family Medicine

## 2016-05-29 ENCOUNTER — Ambulatory Visit (INDEPENDENT_AMBULATORY_CARE_PROVIDER_SITE_OTHER): Payer: Medicare Other | Admitting: Family Medicine

## 2016-05-29 DIAGNOSIS — I10 Essential (primary) hypertension: Secondary | ICD-10-CM | POA: Diagnosis not present

## 2016-05-29 DIAGNOSIS — I6522 Occlusion and stenosis of left carotid artery: Secondary | ICD-10-CM | POA: Diagnosis not present

## 2016-05-29 DIAGNOSIS — E785 Hyperlipidemia, unspecified: Secondary | ICD-10-CM | POA: Diagnosis not present

## 2016-05-29 DIAGNOSIS — J90 Pleural effusion, not elsewhere classified: Secondary | ICD-10-CM | POA: Diagnosis not present

## 2016-05-29 MED ORDER — ROSUVASTATIN CALCIUM 10 MG PO TABS: 10.0000 mg | ORAL_TABLET | Freq: Every day | ORAL | 3 refills | Status: DC

## 2016-05-29 NOTE — Progress Notes (Addendum)
Subjective:  Thomas [name] is a 82 y.o. year old very pleasant male patient who presents for/with See problem oriented charting. Also to establish care as prior Dr. Sherren Mocha patient. Will be seeing his wife next week. Reviewed full history.  ROS- baseline SOB since effusion is stable, no chest pain, no fever, chills, cough.see any ROS included in HPI as well.   Past Medical History-  Patient Active Problem List   Diagnosis Date Noted  . Pleural effusion, left 02/16/2016    Priority: High  . Carotid stenosis 02/21/2013    Priority: High  . TIA (transient ischemic attack) 02/17/2013    Priority: High  . PROSTATE CANCER, HX OF 03/26/2007    Priority: High  . GERD (gastroesophageal reflux disease) 05/17/2011    Priority: Medium  . Hyperlipidemia 03/20/2007    Priority: Medium  . Essential hypertension 03/20/2007    Priority: Medium  . Dyspnea 02/16/2016    Priority: Low  . Perforation of right tympanic membrane 11/24/2013    Priority: Low  . Esophageal dysphagia 05/17/2011    Priority: Low  . Stricture and stenosis of esophagus 08/29/2010    Priority: Low  . Allergic rhinitis 01/16/2008    Priority: Low  . 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 amLODipine (NORVASC) 2.5 MG tablet Take 1 tablet (2.5 mg total) by mouth daily. 90 tablet 3  . aspirin 81 MG tablet Take 1 tablet (81 mg total) by mouth daily.    . cloNIDine (CATAPRES) 0.1 MG tablet Take 1 tablet (0.1 mg total) by mouth 3 (three) times daily. 90 tablet 3  . fluticasone (FLONASE) 50 MCG/ACT nasal spray   0  . omeprazole (PRILOSEC) 40 MG capsule TAKE ONE CAPSULE BY MOUTH ONCE DAILY 60 capsule 7  . oxybutynin (DITROPAN) 5 MG tablet 1 tablet daily when necessary 100 tablet 3  . potassium chloride SA (K-DUR,KLOR-CON) 20 MEQ tablet Take 20 mEq by mouth 2 (two) times daily.    . verapamil (CALAN-SR) 240  MG CR tablet Take 1 tablet (240 mg total) by mouth at bedtime. 100 tablet 3  . rosuvastatin (CRESTOR) 10 MG tablet Take 1 tablet (10 mg total) by mouth daily. 92 tablet 3   No current facility-administered medications for this visit.     Objective: BP 128/78 (BP Location: Left Arm, Patient Position: Sitting, Cuff Size: Large)   Pulse 62   Temp 98.6 F (37 C) (Oral)   Ht 5' 8.5" (1.74 m)   Wt 183 lb 12.8 oz (83.4 kg)   SpO2 96%   BMI 27.54 kg/m  Gen: NAD, resting comfortably CV: RRR no murmurs rubs or gallops Lungs: decreased breath sounds on left from mid lung field down, normal on right CTA but on both sides no crackles, wheeze, rhonchi Abdomen: soft/nontender/nondistended/normal bowel sounds. overweight Ext: no edema Skin: warm, dry, no rash  Assessment/Plan:  Pleural effusion, left S: chronic effusion on left. Has had pulmonary workup and so far of benign etiology. Plan is to monitor and if worsening symptoms will return to care A/P: patient with stable SOB today- he agrees to call to schedule follow up with Dr. Melvyn Novas if worsening shortness of breath  Carotid stenosis S:History left Caroid surgery- January 2017 vascular minimal right ICA stenosis and no restenosis of left CEA. 1 year follow up A/P: BP controlled but has stopped statin for unclear reason- will restart  Hyperlipidemia S: suspect poorly controlled on no statin. No myalgias.  Lab Results  Component Value Date   CHOL 154 06/26/2013   HDL 46.00 06/26/2013   LDLCALC 88 06/26/2013   LDLDIRECT 175.1 05/10/2012   TRIG 98.0 06/26/2013   CHOLHDL 3 06/26/2013   A/P: unclear why stopped- restart crestor 10mg    Essential hypertension S: controlled on clonidine 1 tablet in AM, 2 at bedtime, norvasc 2.5mg , verapamil 240mg  BP Readings from Last 3 Encounters:  05/29/16 128/78  03/08/16 124/80  02/21/16 118/67  A/P:Continue current meds  6 months AWV with Manuela Schwartz, CPE with me in 1 year  Meds ordered this  encounter  Medications  . potassium chloride SA (K-DUR,KLOR-CON) 20 MEQ tablet    Sig: Take 20 mEq by mouth 2 (two) times daily.  . rosuvastatin (CRESTOR) 10 MG tablet    Sig: Take 1 tablet (10 mg total) by mouth daily.    Dispense:  92 tablet    Refill:  3    Return precautions advised.  Garret Reddish, MD

## 2016-05-29 NOTE — Assessment & Plan Note (Signed)
S: controlled on clonidine 1 tablet in AM, 2 at bedtime, norvasc 2.5mg , verapamil 240mg  BP Readings from Last 3 Encounters:  05/29/16 128/78  03/08/16 124/80  02/21/16 118/67  A/P:Continue current meds

## 2016-05-29 NOTE — Assessment & Plan Note (Signed)
S: chronic effusion on left. Has had pulmonary workup and so far of benign etiology. Plan is to monitor and if worsening symptoms will return to care A/P: patient with stable SOB today- he agrees to call to schedule follow up with Dr. Melvyn Novas if worsening shortness of breath

## 2016-05-29 NOTE — Assessment & Plan Note (Signed)
S:History left Caroid surgery- January 2017 vascular minimal right ICA stenosis and no restenosis of left CEA. 1 year follow up A/P: BP controlled but has stopped statin for unclear reason- will restart

## 2016-05-29 NOTE — Patient Instructions (Addendum)
Restart rosuvastatin 10mg  for cholesterol  I would also like for you to sign up for an annual wellness visit with our nurse, Manuela Schwartz, in 6 months who specializes in the annual wellness exam. This is a free benefit under medicare that may help [place] find additional ways to help you. Some highlights are reviewing medications, lifestyle, and doing a dementia screen. She will also check your blood pressure for me.   Then see me for physical in 1 year  Schedule follow up with Dr. Melvyn Novas if worsening shortness of breath

## 2016-05-29 NOTE — Assessment & Plan Note (Addendum)
S: suspect poorly controlled on no statin. No myalgias.  Lab Results  Component Value Date   CHOL 154 06/26/2013   HDL 46.00 06/26/2013   LDLCALC 88 06/26/2013   LDLDIRECT 175.1 05/10/2012   TRIG 98.0 06/26/2013   CHOLHDL 3 06/26/2013   A/P: unclear why stopped- restart crestor 10mg 

## 2016-05-29 NOTE — Progress Notes (Signed)
Pre visit review using our clinic review tool, if applicable. No additional management support is needed unless otherwise documented below in the visit note. 

## 2016-06-12 ENCOUNTER — Encounter: Payer: Self-pay | Admitting: Internal Medicine

## 2016-07-18 ENCOUNTER — Encounter: Payer: Self-pay | Admitting: Family Medicine

## 2016-07-18 ENCOUNTER — Ambulatory Visit (INDEPENDENT_AMBULATORY_CARE_PROVIDER_SITE_OTHER): Payer: Medicare Other | Admitting: Family Medicine

## 2016-07-18 VITALS — BP 128/80 | HR 51 | Temp 97.7°F | Resp 12 | Ht 68.5 in | Wt 180.0 lb

## 2016-07-18 DIAGNOSIS — I4891 Unspecified atrial fibrillation: Secondary | ICD-10-CM | POA: Diagnosis not present

## 2016-07-18 DIAGNOSIS — J069 Acute upper respiratory infection, unspecified: Secondary | ICD-10-CM | POA: Diagnosis not present

## 2016-07-18 DIAGNOSIS — R001 Bradycardia, unspecified: Secondary | ICD-10-CM | POA: Diagnosis not present

## 2016-07-18 LAB — BASIC METABOLIC PANEL
BUN: 22 mg/dL (ref 6–23)
CHLORIDE: 101 meq/L (ref 96–112)
CO2: 26 meq/L (ref 19–32)
Calcium: 8.6 mg/dL (ref 8.4–10.5)
Creatinine, Ser: 1.66 mg/dL — ABNORMAL HIGH (ref 0.40–1.50)
GFR: 50.44 mL/min — ABNORMAL LOW (ref >60.00)
Glucose, Bld: 94 mg/dL (ref 70–99)
Potassium: 3.7 mEq/L (ref 3.5–5.1)
SODIUM: 138 meq/L (ref 135–145)

## 2016-07-18 LAB — POCT INFLUENZA A/B
INFLUENZA B, POC: NEGATIVE
Influenza A, POC: NEGATIVE

## 2016-07-18 LAB — TSH: TSH: 2.45 u[IU]/mL (ref 0.35–4.50)

## 2016-07-18 NOTE — Progress Notes (Signed)
Pre visit review using our clinic review tool, if applicable. No additional management support is needed unless otherwise documented below in the visit note. 

## 2016-07-18 NOTE — Patient Instructions (Addendum)
  [name] I have seen you today for an acute visit.  1. Bradycardia  - EKG 12-Lead - TSH - Basic metabolic panel - Ambulatory referral to Cardiology  2. URI, acute  - POCT Influenza A/B  3. Atrial fibrillation, unspecified type ([place])  If chest pain, worsening dizziness, shortness of breath or mental changes please go to ER. Stop Verapamil for now.   Appointment with cardiologists is being arranged.     In general please monitor for signs of worsening symptoms and seek immediate medical attention if any concerning/warning symptom as we discussed.     Please be sure you have an appointment already scheduled with your PCP before you leave today.

## 2016-07-18 NOTE — Progress Notes (Signed)
HPI:  ACUTE VISIT:  Chief Complaint  Patient presents with  . Cough    x 2 days    Thomas [name] is a 82 y.o. male, who is here today with his wife and son complaining of 2 days of respiratory symptoms.    Non productive cough. He has not noted wheezing or dyspnea. Denies fever, chill, odynophagia, or body aches.  Mild nasal congestion, rhinorrhea and post nasal drainage.   No Hx of recent travel. Sick contact: wife. No known insect bite. + Hx of allergic rhinitis, he is on Flonase nasal spray.  OTC medications for this problem: Coricidin  Symptoms otherwise stable.  -During examination it was noted bradycardia, he reports history of irregular heart rate. He also mentions that this morning he felt some lightheadedness, lasted for a few seconds. He denies any headache, dyspnea, chest pain, diaphoresis, or palpitations. He follows with vascular surgeon because Hx of carotid artery stenosis, he was last seen in January 2017. He has seen Dr. Kalman Shan, cardiologist, a few years ago, 2014.  He denies orthopnea, PND, or LE edema.  Hx of HTN, he is currently on Verapamil 240 mg daily, Clonidine 0/1 mg tid, and Amlodipine 2.5 mg daily. HypoK+ on KCL 20 meq daily.   Review of Systems  Constitutional: Negative for activity change, appetite change, chills, fatigue and fever.  HENT: Positive for congestion, postnasal drip and rhinorrhea. Negative for ear pain, mouth sores, nosebleeds, sneezing, sore throat, trouble swallowing and voice change.   Eyes: Negative for discharge, redness and itching.  Respiratory: Positive for cough. Negative for chest tightness, shortness of breath and wheezing.   Gastrointestinal: Negative for abdominal pain, diarrhea, nausea and vomiting.  Endocrine: Negative for cold intolerance and heat intolerance.  Genitourinary: Negative for decreased urine volume and hematuria.  Musculoskeletal: Negative for myalgias and neck pain.  Skin: Negative for  rash.  Neurological: Positive for light-headedness. Negative for syncope, weakness and headaches.  Psychiatric/Behavioral: Negative for confusion. The patient is not nervous/anxious.       Current Outpatient Prescriptions on File Prior to Visit  Medication Sig Dispense Refill  . amLODipine (NORVASC) 2.5 MG tablet Take 1 tablet (2.5 mg total) by mouth daily. 90 tablet 3  . aspirin 81 MG tablet Take 1 tablet (81 mg total) by mouth daily.    . cloNIDine (CATAPRES) 0.1 MG tablet Take 1 tablet (0.1 mg total) by mouth 3 (three) times daily. 90 tablet 3  . fluticasone (FLONASE) 50 MCG/ACT nasal spray   0  . omeprazole (PRILOSEC) 40 MG capsule TAKE ONE CAPSULE BY MOUTH ONCE DAILY 60 capsule 7  . oxybutynin (DITROPAN) 5 MG tablet 1 tablet daily when necessary 100 tablet 3  . potassium chloride SA (K-DUR,KLOR-CON) 20 MEQ tablet Take 20 mEq by mouth 2 (two) times daily.    . rosuvastatin (CRESTOR) 10 MG tablet Take 1 tablet (10 mg total) by mouth daily. 92 tablet 3   No current facility-administered medications on file prior to visit.      Past Medical History:  Diagnosis Date  . Adenomatous colon polyp 2009  . Allergy   . Carotid artery occlusion   . Diverticulosis of colon (without mention of hemorrhage)   . ED (erectile dysfunction)   . Esophageal stricture   . Esophagitis   . GERD (gastroesophageal reflux disease)   . Hiatal hernia   . Hypertension   . Prostate cancer ([place])   . Unspecified gastritis and gastroduodenitis without mention of hemorrhage  Allergies  Allergen Reactions  . Ace Inhibitors Hives  . Penicillins Swelling    Swelling  of the face.    Social History   Social History  . Marital status: Married    Spouse name: N/A  . Number of children: N/A  . Years of education: N/A   Social History Main Topics  . Smoking status: Never Smoker  . Smokeless tobacco: Never Used  . Alcohol use 0.0 oz/week     Comment: daily--one alcohol drink a day  . Drug use: No    . Sexual activity: Not Asked   Other Topics Concern  . None   Social History Narrative   Married 1948. 6 children. 9 grandkids. 2 greatgrandkids.       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       Hobbies: golfing about twice a month.     Vitals:   07/18/16 1004  BP: 128/80  Pulse: (!) 51  Resp: 12  Temp: 97.7 F (36.5 C)    O2 sat 95% at RA   Body mass index is 26.97 kg/m.   Physical Exam  Nursing note and vitals reviewed. Constitutional: He is oriented to person, place, and time. He appears well-developed. He does not appear ill. No distress.  HENT:  Head: Atraumatic.  Right Ear: Tympanic membrane, external ear and ear canal normal.  Left Ear: Tympanic membrane, external ear and ear canal normal.  Nose: Rhinorrhea present. Right sinus exhibits no maxillary sinus tenderness and no frontal sinus tenderness. Left sinus exhibits no maxillary sinus tenderness and no frontal sinus tenderness.  Mouth/Throat: Oropharynx is clear and moist and mucous membranes are normal.  Post nasal drainage.   Eyes: Conjunctivae and EOM are normal.  Cardiovascular: An irregular rhythm present. Bradycardia present.   No murmur heard. HR 36/min  Respiratory: Effort normal. No stridor. No respiratory distress. He has decreased breath sounds (left base).  Reporting Hx of "collapsed lung"  Lymphadenopathy:       Head (right side): No submandibular adenopathy present.       Head (left side): No submandibular adenopathy present.    He has no cervical adenopathy.  Neurological: He is alert and oriented to person, place, and time. He has normal strength. Coordination normal.  Stable gait with no assistance.  Skin: Skin is warm. No rash noted. No erythema.  Psychiatric: He has a normal mood and affect. His speech is normal.  Well groomed, good eye contact.      ASSESSMENT AND PLAN:     Elder was seen today for cough.  Diagnoses and all orders  for this visit:  Bradycardia  Severe, hemodynamically stable, reporting one episode of lightheadedness this morning. I do not think he needs to got to ER now. Verapamil was discontinued.  No changes in the rest of his medications. Clearly instructed about warning signs. Cardiology referral placed, stat.   -     EKG 12-Lead -     TSH -     Basic metabolic panel -     Ambulatory referral to Cardiology  Atrial fibrillation, unspecified type ([place])  EKG done today, compared with prior EKG's atrial fib now present. CHA2DS2VASc score 4 (HTN,age >75,PAD/TIA), we dicussed some side effects of anticoagulation as well as benefits, he is not very interested in adding another medication, for now he will continue Aspirin. Clearly instructed about warning signs, new recommendations will be given according to lab results.    URI, acute  Symptoms suggests a  viral etiology, I explained patient that symptomatic treatment is usually recommended in this case, so I do not think abx is needed at this time. Instructed to monitor for signs of complications, including new onset of fever among some, clearly instructed about warning signs. I also explained that cough and nasal congestion can last a few days and sometimes weeks. F/U as needed.   -     POCT Influenza A/B     I discussed pt with Dr Marlou Porch, cardiologists on call; who graciously agreed to review case and EKG. Since he is hemodynamically stable,Dr Marlou Porch agrees with plan and recommends repeating EKG in 2-3 days. If still severe bradycardia and/or symptomatic he will need to be admitted to hospital for pacemaker evaluation. We will contact pt and bring him back in 2-3 days to repeat EKG.   Return in about 1 week (around 07/25/2016) for PCP.     -Mr.Arnette Norris and son were advised to return or notify a doctor immediately if symptoms worsen or new concerns arise, they voice understanding.       Thomas G. Martinique, MD  [facility]. [place] office.

## 2016-07-21 ENCOUNTER — Ambulatory Visit (INDEPENDENT_AMBULATORY_CARE_PROVIDER_SITE_OTHER): Payer: Medicare Other | Admitting: Family Medicine

## 2016-07-21 ENCOUNTER — Ambulatory Visit (INDEPENDENT_AMBULATORY_CARE_PROVIDER_SITE_OTHER)
Admission: RE | Admit: 2016-07-21 | Discharge: 2016-07-21 | Disposition: A | Discharge status: Home / Self Care | Payer: Medicare Other | Source: Ambulatory Visit | Attending: Family Medicine | Admitting: Family Medicine

## 2016-07-21 VITALS — BP 160/82 | HR 70 | Temp 98.1°F | Resp 16 | Wt 177.6 lb

## 2016-07-21 DIAGNOSIS — R05 Cough: Secondary | ICD-10-CM

## 2016-07-21 DIAGNOSIS — I10 Essential (primary) hypertension: Secondary | ICD-10-CM

## 2016-07-21 DIAGNOSIS — I4891 Unspecified atrial fibrillation: Secondary | ICD-10-CM

## 2016-07-21 DIAGNOSIS — I4821 Permanent atrial fibrillation: Secondary | ICD-10-CM | POA: Insufficient documentation

## 2016-07-21 DIAGNOSIS — R059 Cough, unspecified: Secondary | ICD-10-CM

## 2016-07-21 DIAGNOSIS — R944 Abnormal results of kidney function studies: Secondary | ICD-10-CM

## 2016-07-21 MED ORDER — APIXABAN 2.5 MG PO TABS: 2.5000 mg | ORAL_TABLET | Freq: Two times a day (BID) | ORAL | 1 refills | Status: DC

## 2016-07-21 MED ORDER — AMLODIPINE BESYLATE 2.5 MG PO TABS: 5.0000 mg | ORAL_TABLET | Freq: Every day | ORAL | 0 refills | Status: DC

## 2016-07-21 MED ORDER — BENZONATATE 100 MG PO CAPS: 200.0000 mg | ORAL_CAPSULE | Freq: Two times a day (BID) | ORAL | 0 refills | Status: AC | PRN

## 2016-07-21 NOTE — Progress Notes (Signed)
HPI:   Thomas [name] is a 82 y.o. male, who is here today with his son to follow on bradycardia and atrial fib noted last OV, 07/18/16.  Last seen 07/18/16 for acute URI symptoms, noted severe bradycardia. Verapamil 240 mg discontinued.  He is checking BP at home, "good", does not recall readings. Here today BP elevated. He is on Catapres 0.1 mg tid and Amlodipine 2.5 mg daily.   He is reporting feeling much better. He denies any lightheadedness, chest pain, palpitations, dyspnea, or diaphoresis.    Concerns today: Cough getting worse, requesting something for cough. Some is reporting, getting worse, nonproductive. He has not had chills, fever, myalgias, or fatigue. No wheezing reported.   Last renal function mainly abnormal. He denies gross hematuria, foam in urine, decreased urine output, or edema.    Lab Results  Component Value Date   CREATININE 1.66 (H) 07/18/2016   BUN 22 07/18/2016   NA 138 07/18/2016   K 3.7 07/18/2016   CL 101 07/18/2016   CO2 26 07/18/2016     Review of Systems  Constitutional: Negative for appetite change, fatigue, fever and unexpected weight change.  HENT: Negative for nosebleeds, sore throat and trouble swallowing.   Eyes: Negative for pain and visual disturbance.  Respiratory: Positive for cough. Negative for shortness of breath and wheezing.   Cardiovascular: Negative for chest pain, palpitations and leg swelling.  Gastrointestinal: Negative for abdominal pain, nausea and vomiting.  Genitourinary: Negative for decreased urine volume and hematuria.  Neurological: Negative for syncope, weakness, light-headedness and headaches.  Psychiatric/Behavioral: Negative for confusion.      Current Outpatient Prescriptions on File Prior to Visit  Medication Sig Dispense Refill  . cloNIDine (CATAPRES) 0.1 MG tablet Take 1 tablet (0.1 mg total) by mouth 3 (three) times daily. 90 tablet 3  . fluticasone (FLONASE) 50 MCG/ACT nasal  spray   0  . omeprazole (PRILOSEC) 40 MG capsule TAKE ONE CAPSULE BY MOUTH ONCE DAILY 60 capsule 7  . oxybutynin (DITROPAN) 5 MG tablet 1 tablet daily when necessary 100 tablet 3  . potassium chloride SA (K-DUR,KLOR-CON) 20 MEQ tablet Take 20 mEq by mouth 2 (two) times daily.    . rosuvastatin (CRESTOR) 10 MG tablet Take 1 tablet (10 mg total) by mouth daily. 92 tablet 3   No current facility-administered medications on file prior to visit.      Past Medical History:  Diagnosis Date  . Adenomatous colon polyp 2009  . Allergy   . Carotid artery occlusion   . Diverticulosis of colon (without mention of hemorrhage)   . ED (erectile dysfunction)   . Esophageal stricture   . Esophagitis   . GERD (gastroesophageal reflux disease)   . Hiatal hernia   . Hypertension   . Prostate cancer ([place])   . Unspecified gastritis and gastroduodenitis without mention of hemorrhage    Allergies  Allergen Reactions  . Ace Inhibitors Hives  . Penicillins Swelling    Swelling  of the face.    Social History   Social History  . Marital status: Married    Spouse name: N/A  . Number of children: N/A  . Years of education: N/A   Social History Main Topics  . Smoking status: Never Smoker  . Smokeless tobacco: Never Used  . Alcohol use 0.0 oz/week     Comment: daily--one alcohol drink a day  . Drug use: No  . Sexual activity: Not on file   Other Topics Concern  .  Not on file   Social History Narrative   Married 1948. 6 children. 9 grandkids. 2 greatgrandkids.       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       Hobbies: golfing about twice a month.     Vitals:   07/21/16 1411  BP: (!) 160/82  Pulse: 70  Resp: 16  Temp: 98.1 F (36.7 C)   Body mass index is 26.61 kg/m.    Physical Exam  Nursing note and vitals reviewed. Constitutional: He is oriented to person, place, and time. He appears well-developed and well-nourished. No distress.    HENT:  Head: Atraumatic.  Mouth/Throat: Oropharynx is clear and moist and mucous membranes are normal.  Eyes: Conjunctivae and EOM are normal.  Cardiovascular: Normal rate.  An irregular rhythm present.  No murmur heard. DP pulses hard to find, present bilateral.  Respiratory: Effort normal and breath sounds normal. No respiratory distress. He has no wheezes. He has no rhonchi. He has no rales.  Stable hypoventilation left base.  Musculoskeletal: He exhibits no edema.  Neurological: He is alert and oriented to person, place, and time. He has normal strength. Coordination normal.  Stable gait with no assistance.  Skin: Skin is warm. No erythema.  Psychiatric: He has a normal mood and affect.  Well groomed, good eye contact.      ASSESSMENT AND PLAN:     Diagnoses and all orders for this visit:   Atrial fibrillation, unspecified type ([place])  Flutter/A fib.  CHAD2DS2VASc 4  After extensive discussion about benefits and risk of anticoagulation he agrees with starting Eliquis. His last Cr 1.6, so 2.5 mg bid recommended. EKG today atrial fib, bradycardia resolved, not longer LAD., some mild changes on some lead but so signs of acute ischemia. Cardiology appt is still pending. Clearly instructed about warning signs. F/U with PCP in 2 weeks.  -     apixaban (ELIQUIS) 2.5 MG TABS tablet; Take 1 tablet (2.5 mg total) by mouth 2 (two) times daily. -     EKG 12-Lead  Essential hypertension  BP elevated. Amlodipine increased from 2.5 mg to 5 mg. No changes in Catapres. Monitor BP at home. F/U in 2 weeks with PCP.   -     amLODipine (NORVASC) 2.5 MG tablet; Take 2 tablets (5 mg total) by mouth daily.  Cough  Auscultation today does not suggest pneumonia, son is concerned so CXR ordered today. Instructed about warning signs. F/U as needed.  -     benzonatate (TESSALON) 100 MG capsule; Take 2 capsules (200 mg total) by mouth 2 (two) times daily as needed for cough. -      DG Chest 2 View; Future   Abnormal renal function test  AKF, mild. Adequate hydration, avoid NSAID;s. BMP in 3-4 days.     -Mr. Fernado Zaldivar was advised to return sooner than planned today if new concerns arise.       Dolphus Linch G. [place], MD  [facility]. [place] office.

## 2016-07-21 NOTE — Patient Instructions (Addendum)
A few things to remember from today's visit:   Cough - Plan: benzonatate (TESSALON) 100 MG capsule, DG Chest 2 View  Atrial fibrillation, unspecified type ([place]) - Plan: apixaban (ELIQUIS) 2.5 MG TABS tablet, EKG 12-Lead  Essential hypertension - Plan: amLODipine (NORVASC) 2.5 MG tablet  Eliquis is a blood thinner, recommended because atrial fibrillation.  Kidney test to repeat next week.  Pending heart doctor appointment.  Stop Aspirin for now.   Today I do not hear pneumonia. Cough can last a few days of weeks after colds. Chest X ray today.   Please be sure medication list is accurate. If a new problem present, please set up appointment sooner than planned today.

## 2016-07-25 ENCOUNTER — Ambulatory Visit: Payer: Medicare Other | Admitting: Family Medicine

## 2016-07-25 ENCOUNTER — Other Ambulatory Visit (INDEPENDENT_AMBULATORY_CARE_PROVIDER_SITE_OTHER): Payer: Medicare Other

## 2016-07-25 DIAGNOSIS — R944 Abnormal results of kidney function studies: Secondary | ICD-10-CM

## 2016-07-25 DIAGNOSIS — I10 Essential (primary) hypertension: Secondary | ICD-10-CM | POA: Diagnosis not present

## 2016-07-25 LAB — BASIC METABOLIC PANEL
BUN: 18 mg/dL (ref 6–23)
CO2: 25 meq/L (ref 19–32)
Calcium: 8.9 mg/dL (ref 8.4–10.5)
Chloride: 103 mEq/L (ref 96–112)
Creatinine, Ser: 1.31 mg/dL (ref 0.40–1.50)
GFR: 66.29 mL/min (ref >60.00)
GLUCOSE: 114 mg/dL — AB (ref 70–99)
POTASSIUM: 3.5 meq/L (ref 3.5–5.1)
SODIUM: 138 meq/L (ref 135–145)

## 2016-08-04 ENCOUNTER — Encounter: Payer: Self-pay | Admitting: Family Medicine

## 2016-08-04 ENCOUNTER — Ambulatory Visit (INDEPENDENT_AMBULATORY_CARE_PROVIDER_SITE_OTHER): Payer: Medicare Other | Admitting: Family Medicine

## 2016-08-04 DIAGNOSIS — I4891 Unspecified atrial fibrillation: Secondary | ICD-10-CM | POA: Diagnosis not present

## 2016-08-04 DIAGNOSIS — F1010 Alcohol abuse, uncomplicated: Secondary | ICD-10-CM | POA: Diagnosis not present

## 2016-08-04 DIAGNOSIS — I10 Essential (primary) hypertension: Secondary | ICD-10-CM | POA: Diagnosis not present

## 2016-08-04 MED ORDER — BENZONATATE 100 MG PO CAPS: 100.0000 mg | ORAL_CAPSULE | Freq: Two times a day (BID) | ORAL | 0 refills | Status: DC | PRN

## 2016-08-04 NOTE — Assessment & Plan Note (Addendum)
S: controlled on clonidine 1 tablet in AM, 2 at bedtime, amlodipine 2.5mg - no longer on verapamil. Occasional mild orthostatic symptoms BP Readings from Last 3 Encounters:  08/04/16 118/76  07/21/16 (!) 160/82  07/18/16 128/80  A/P:Continue current medications. Some variability in last few BPs- we opted to monitor for now and reassess in 6-8 weeks. Could consider stopping amlodipine but want to watch trend some more.

## 2016-08-04 NOTE — Progress Notes (Signed)
Subjective:  Thomas [name] is a 82 y.o. year old very pleasant male patient who presents for/with See problem oriented charting ROS- continued cough and congestion for about 3 weeks, tessalon helps with cough, no fever or chills, no shortness of breath   Past Medical History-  Patient Active Problem List   Diagnosis Date Noted  . Atrial fibrillation ([place]) 07/21/2016    Priority: High  . Pleural effusion, left 02/16/2016    Priority: High  . Carotid stenosis 02/21/2013    Priority: High  . TIA (transient ischemic attack) 02/17/2013    Priority: High  . PROSTATE CANCER, HX OF 03/26/2007    Priority: High  . GERD (gastroesophageal reflux disease) 05/17/2011    Priority: Medium  . Hyperlipidemia 03/20/2007    Priority: Medium  . Essential hypertension 03/20/2007    Priority: Medium  . Dyspnea 02/16/2016    Priority: Low  . Perforation of right tympanic membrane 11/24/2013    Priority: Low  . Esophageal dysphagia 05/17/2011    Priority: Low  . Stricture and stenosis of esophagus 08/29/2010    Priority: Low  . Allergic rhinitis 01/16/2008    Priority: Low  . ERECTILE DYSFUNCTION, ORGANIC 03/26/2007    Priority: Low  . Alcohol abuse 08/04/2016    Medications- reviewed and updated Current Outpatient Prescriptions  Medication Sig Dispense Refill  . amLODipine (NORVASC) 2.5 MG tablet Take 2 tablets (5 mg total) by mouth daily. 90 tablet 0  . apixaban (ELIQUIS) 2.5 MG TABS tablet Take 1 tablet (2.5 mg total) by mouth 2 (two) times daily. 60 tablet 1  . benzonatate (TESSALON) 100 MG capsule Take 1 capsule (100 mg total) by mouth 2 (two) times daily as needed for cough. 20 capsule 0  . cloNIDine (CATAPRES) 0.1 MG tablet Take 1 tablet (0.1 mg total) by mouth 3 (three) times daily. 90 tablet 3  . fluticasone (FLONASE) 50 MCG/ACT nasal spray   0  . omeprazole (PRILOSEC) 40 MG capsule TAKE ONE CAPSULE BY MOUTH ONCE DAILY 60 capsule 7  . oxybutynin (DITROPAN) 5 MG tablet 1 tablet daily  when necessary 100 tablet 3  . potassium chloride SA (K-DUR,KLOR-CON) 20 MEQ tablet Take 20 mEq by mouth 2 (two) times daily.    . rosuvastatin (CRESTOR) 10 MG tablet Take 1 tablet (10 mg total) by mouth daily. (Patient not taking: Reported on 08/04/2016) 92 tablet 3   No current facility-administered medications for this visit.     Objective: BP 118/76 (BP Location: Right Arm, Patient Position: Sitting, Cuff Size: Normal)   Pulse 84   Temp 98.2 F (36.8 C) (Oral)   Wt 180 lb (81.6 kg)   SpO2 96%   BMI 26.97 kg/m  Gen: NAD, resting comfortably Oropharynx normal, nares largely normal- some clear drainage, TM normal CV: irregularly irregular, no murmurs rubs or gallops Lungs: CTAB no crackles, wheeze, rhonchi Abdomen: soft/nontender/nondistended/normal bowel sounds. No rebound or guarding.  Ext: no edema Skin: warm, dry Neuro: walks across room without any obvious balance issues, stands from chair without balance issues  Assessment/Plan:   Atrial fibrillation (HCC) S: bradycardia noted on 07/18/16 and ekg showed a fib. Verapamil was stopped. Eliquis was started at 2.5mg  BID and cardiology consulted (will see in January) A/P: seems to be doing very well- remains in a fib. Wants to discuss options with cardiology though I suspect likelihood of intervention is low.    Essential hypertension S: controlled on clonidine 1 tablet in AM, 2 at bedtime, amlodipine 2.5mg - no longer  on verapamil. Occasional mild orthostatic symptoms BP Readings from Last 3 Encounters:  08/04/16 118/76  07/21/16 (!) 160/82  07/18/16 128/80  A/P:Continue current medications. Some variability in last few BPs- we opted to monitor for now and reassess in 6-8 weeks. Could consider stopping amlodipine but want to watch trend some more.   Alcohol abuse S: patient has felt some balance issues. At times with standings. At times feels mild room spinning. Family states he usually takes at least 2 but mostly 3 shots a  day of brandy. He also has not been sleeping well at night and taking long daytime naps A/P: I advised patient to cut down to 0-1 alcoholic beverage a day. Advised him to avoid daytime naps and try to sleep at night. We may also adjust BP meds at next visit- suspect that the above 3 adjustments should help his balance issues- if not consider PT for balance issues- no acute change and doubt CVA.    3-4 week visit or sooner if needed  Meds ordered this encounter  Medications  . DISCONTD: benzonatate (TESSALON) 100 MG capsule    Sig: Take by mouth 2 (two) times daily as needed for cough.  . benzonatate (TESSALON) 100 MG capsule    Sig: Take 1 capsule (100 mg total) by mouth 2 (two) times daily as needed for cough.    Dispense:  20 capsule    Refill:  0    Return precautions advised.  Garret Reddish, MD

## 2016-08-04 NOTE — Assessment & Plan Note (Addendum)
S: bradycardia noted on 07/18/16 and ekg showed a fib. Verapamil was stopped. Eliquis was started at 2.5mg  BID and cardiology consulted (will see in January)  Irregular rhythm on exam today.  A/P: seems to be doing very well- remains in a fib. Wants to discuss options with cardiology though I suspect likelihood of intervention is low.

## 2016-08-04 NOTE — Assessment & Plan Note (Addendum)
S: patient has felt some balance issues. At times with standings. At times feels mild room spinning. Family states he usually takes at least 2 but mostly 3 shots a day of brandy. He also has not been sleeping well at night and taking long daytime naps A/P: I advised patient to cut down to 0-1 alcoholic beverage a day. Advised him to avoid daytime naps and try to sleep at night. We may also adjust BP meds at next visit- suspect that the above 3 adjustments should help his balance issues- if not consider PT for balance issues- no acute change and doubt CVA. Wonder if alcohol intake contributed to a fib

## 2016-08-04 NOTE — Progress Notes (Signed)
Pre visit review using our clinic review tool, if applicable. No additional management support is needed unless otherwise documented below in the visit note. 

## 2016-08-04 NOTE — Patient Instructions (Signed)
Refilled cough medicine  Why not taking cholesterol medicine?  Advise you to see your ear doctor- there appears to be possible blood behind the membrane which I do not remember fromprevious.   No naps in the day- focus on sleeping at night  Cut alcohol shots down to 0-1 per day  Happy to reevaluate in 3-4 weeks to check in on your balance issues/room spinning if not doing better with the above

## 2016-08-08 NOTE — Progress Notes (Signed)
Cardiology Office Note   Date:  08/09/2016   ID:  Javin Palms, DOB 01-Jun-1926, MRN [id number]  PCP:  Garret Reddish, MD  Cardiologist:   Minus Breeding, MD  Referring:  Garret Reddish, MD  Chief Complaint  Patient presents with  . Atrial Fibrillation      History of Present Illness: Thomas [name] is a 82 y.o. male who presents for evaluation of atrial fibrillation. He was recently noted to have this at this at his primary care office. This was found incidentally.  He does not feel this. He denies any cardiovascular symptoms such as palpitations, presyncope or syncope. He does not get any chest pressure, neck or arm discomfort. He has been pretty healthy for his age.  He was started on anticoagulation. His rate seems to be well controlled.  He gets around unassisted although he has some slight balance issues.  Past Medical History:  Diagnosis Date  . Adenomatous colon polyp 2009  . Allergy   . Carotid artery occlusion   . Diverticulosis of colon (without mention of hemorrhage)   . ED (erectile dysfunction)   . Esophageal stricture   . Esophagitis   . GERD (gastroesophageal reflux disease)   . Hiatal hernia   . Hypertension   . Prostate cancer ([place])   . Unspecified gastritis and gastroduodenitis without mention of hemorrhage     Past Surgical History:  Procedure Laterality Date  . ENDARTERECTOMY Left 03/05/2013   Procedure: ENDARTERECTOMY CAROTID;  Surgeon: Conrad , MD;  Location: [facility];  Service: Vascular;  Laterality: Left;  . ESOPHAGOGASTRODUODENOSCOPY (EGD) WITH ESOPHAGEAL DILATION    . PROSTATE SURGERY     Laser surgery     Current Outpatient Prescriptions  Medication Sig Dispense Refill  . amLODipine (NORVASC) 2.5 MG tablet Take 2 tablets (5 mg total) by mouth daily. 90 tablet 0  . apixaban (ELIQUIS) 2.5 MG TABS tablet Take 1 tablet (2.5 mg total) by mouth 2 (two) times daily. 60 tablet 1  . benzonatate (TESSALON) 100 MG capsule Take 1 capsule (100  mg total) by mouth 2 (two) times daily as needed for cough. 20 capsule 0  . cloNIDine (CATAPRES) 0.1 MG tablet Take 1 tablet (0.1 mg total) by mouth 3 (three) times daily. 90 tablet 3  . fluticasone (FLONASE) 50 MCG/ACT nasal spray   0  . omeprazole (PRILOSEC) 40 MG capsule TAKE ONE CAPSULE BY MOUTH ONCE DAILY 60 capsule 7  . oxybutynin (DITROPAN) 5 MG tablet 1 tablet daily when necessary 100 tablet 3  . potassium chloride SA (K-DUR,KLOR-CON) 20 MEQ tablet Take 20 mEq by mouth 2 (two) times daily.    . rosuvastatin (CRESTOR) 10 MG tablet Take 1 tablet (10 mg total) by mouth daily. (Patient not taking: Reported on 08/04/2016) 92 tablet 3   No current facility-administered medications for this visit.     Allergies:   Ace inhibitors and Penicillins    Social History:  The patient  reports that he has never smoked. He has never used smokeless tobacco. He reports that he drinks alcohol. He reports that he does not use drugs.   Family History:  The patient's family history includes Breast cancer in his sister; Cancer in his mother; Heart disease in his father; Hyperlipidemia in his daughter; Hypertension in his daughter and son.    ROS:  Please see the history of present illness.   Otherwise, review of systems are positive for balance issues.   All other systems are reviewed and negative.  PHYSICAL EXAM: VS:  BP (!) 146/98 Comment: Left arm.  Pulse 76   Ht 5\' 8"  (1.727 m)   Wt 181 lb (82.1 kg)   BMI 27.52 kg/m  , BMI Body mass index is 27.52 kg/m. GENERAL:  Well appearing HEENT:  Pupils equal round and reactive, fundi not visualized, oral mucosa unremarkable NECK:  No jugular venous distention, waveform within normal limits, carotid upstroke brisk and symmetric, no bruits, no thyromegaly LYMPHATICS:  No cervical, inguinal adenopathy LUNGS:  Decreased breath sounds on the left with dullness to percussion. BACK:  No CVA tenderness CHEST:  Unremarkable HEART:  PMI not displaced or  sustained,S1 and S2 within normal limits, no S3, no clicks, no rubs, no murmurs, irregular ABD:  Flat, positive bowel sounds normal in frequency in pitch, no bruits, no rebound, no guarding, no midline pulsatile mass, no hepatomegaly, no splenomegaly EXT:  2 plus pulses throughout, no edema, no cyanosis no clubbing SKIN:  No rashes no nodules NEURO:  Cranial nerves II through XII grossly intact, motor grossly intact throughout PSYCH:  Cognitively intact, oriented to person place and time    EKG:  EKG is ordered today. The ekg ordered today demonstrates atrial fibrillation, rate 72, axis within normal limits, intervals within normal limits, early transition in lead V2, no acute ST-T wave changes.   Recent Labs: 02/16/2016: ALT 12; Hemoglobin 13.7; Platelets 265.0; Pro B Natriuretic peptide (BNP) 69.0 07/18/2016: TSH 2.45 07/25/2016: BUN 18; Creatinine, Ser 1.31; Potassium 3.5; Sodium 138    Lipid Panel    Component Value Date/Time   CHOL 154 06/26/2013 0825   TRIG 98.0 06/26/2013 0825   HDL 46.00 06/26/2013 0825   CHOLHDL 3 06/26/2013 0825   VLDL 19.6 06/26/2013 0825   LDLCALC 88 06/26/2013 0825   LDLDIRECT 175.1 05/10/2012 1003      Wt Readings from Last 3 Encounters:  08/09/16 181 lb (82.1 kg)  08/04/16 180 lb (81.6 kg)  07/21/16 177 lb 9.6 oz (80.6 kg)      Other studies Reviewed: Additional studies/ records that were reviewed today include: Office records. Review of the above records demonstrates:  Please see elsewhere in the note.     ASSESSMENT AND PLAN:   ATRIAL FIB:   [name] has a CHA2DS2 - VASc score of 3 with a risk of stroke of 3.3%.  He tolerates anticoagulation but should be on Eliquis 5 mg bid and I will increase this dose.  I will check an echocardiogram.  Otherwise no other evaluation is necessary.  He seems to tolerate this med.    HTN:   The blood pressure is well controlled.  He will continue the meds as listed.   DYSLIPIDEMIA:  I will  check a lipid profile with direct LDL.  He is not taking his Lipitor as he thought that he had some imbalance with this although he only took one dose.  In the absence of CAD the benefit of this might be marginal if his LDL is not particularly elevated (above 190).  I will check an LDL.    Current medicines are reviewed at length with the patient today.  The patient does not have concerns regarding medicines.  The following changes have been made:  As above.  Labs/ tests ordered today include:   Orders Placed This Encounter  Procedures  . Lipid panel  . Direct LDL  . Basic Metabolic Panel (BMET)  . Hepatic function panel  . ECHOCARDIOGRAM COMPLETE     Disposition:  FU with me on 1 month.      Signed, Minus Breeding, MD  08/09/2016 12:48 PM    [facility]

## 2016-08-09 ENCOUNTER — Ambulatory Visit (INDEPENDENT_AMBULATORY_CARE_PROVIDER_SITE_OTHER): Payer: Medicare Other | Admitting: Cardiology

## 2016-08-09 ENCOUNTER — Encounter: Payer: Self-pay | Admitting: Cardiology

## 2016-08-09 VITALS — BP 146/98 | HR 76 | Ht 68.0 in | Wt 181.0 lb

## 2016-08-09 DIAGNOSIS — E785 Hyperlipidemia, unspecified: Secondary | ICD-10-CM | POA: Diagnosis not present

## 2016-08-09 DIAGNOSIS — I4891 Unspecified atrial fibrillation: Secondary | ICD-10-CM

## 2016-08-09 DIAGNOSIS — I482 Chronic atrial fibrillation: Secondary | ICD-10-CM

## 2016-08-09 DIAGNOSIS — R001 Bradycardia, unspecified: Secondary | ICD-10-CM | POA: Diagnosis not present

## 2016-08-09 DIAGNOSIS — I4821 Permanent atrial fibrillation: Secondary | ICD-10-CM

## 2016-08-09 LAB — LDL CHOLESTEROL, DIRECT: Direct LDL: 143 mg/dL — ABNORMAL HIGH (ref <130)

## 2016-08-09 LAB — LIPID PANEL
Cholesterol: 199 mg/dL (ref <200)
HDL: 37 mg/dL — ABNORMAL LOW (ref >40)
LDL CALC: 144 mg/dL — AB (ref <100)
TRIGLYCERIDES: 90 mg/dL (ref <150)
Total CHOL/HDL Ratio: 5.4 Ratio — ABNORMAL HIGH (ref <5.0)
VLDL: 18 mg/dL (ref <30)

## 2016-08-09 LAB — HEPATIC FUNCTION PANEL
ALK PHOS: 79 U/L (ref 40–115)
ALT: 10 U/L (ref 9–46)
AST: 11 U/L (ref 10–35)
Albumin: 3.4 g/dL — ABNORMAL LOW (ref 3.6–5.1)
BILIRUBIN DIRECT: 0.1 mg/dL (ref <=0.2)
BILIRUBIN INDIRECT: 0.3 mg/dL (ref 0.2–1.2)
BILIRUBIN TOTAL: 0.4 mg/dL (ref 0.2–1.2)
Total Protein: 6.6 g/dL (ref 6.1–8.1)

## 2016-08-09 LAB — BASIC METABOLIC PANEL
BUN: 14 mg/dL (ref 7–25)
CHLORIDE: 101 mmol/L (ref 98–110)
CO2: 24 mmol/L (ref 20–31)
Calcium: 8.7 mg/dL (ref 8.6–10.3)
Creat: 1.24 mg/dL — ABNORMAL HIGH (ref 0.70–1.11)
Glucose, Bld: 100 mg/dL — ABNORMAL HIGH (ref 65–99)
POTASSIUM: 3.6 mmol/L (ref 3.5–5.3)
SODIUM: 141 mmol/L (ref 135–146)

## 2016-08-09 MED ORDER — APIXABAN 5 MG PO TABS: 5.0000 mg | ORAL_TABLET | Freq: Two times a day (BID) | ORAL | 11 refills | Status: DC

## 2016-08-09 NOTE — Addendum Note (Signed)
Addended by: Vennie Homans on: 08/09/2016 01:37 PM   Modules accepted: Orders

## 2016-08-09 NOTE — Patient Instructions (Addendum)
Medication Instructions:  INCREASE- Eliquis 5 mg twice a day  Labwork: Fasting Lipid Liver, Direct LDL, BMP  Testing/Procedures: Your physician has requested that you have an echocardiogram. Echocardiography is a painless test that uses sound waves to create images of your heart. It provides your doctor with information about the size and shape of your heart and how well your heart's chambers and valves are working. This procedure takes approximately one hour. There are no restrictions for this procedure.  Follow-Up: Your physician recommends that you schedule a follow-up appointment in: 1 Month   Any Other Special Instructions Will Be Listed Below (If Applicable).       [place]   If you need a refill on your cardiac medications before your next appointment, please call your pharmacy.

## 2016-08-10 ENCOUNTER — Other Ambulatory Visit: Payer: Self-pay

## 2016-08-10 ENCOUNTER — Ambulatory Visit (HOSPITAL_COMMUNITY): Payer: Medicare Other | Attending: Cardiovascular Disease

## 2016-08-10 DIAGNOSIS — I10 Essential (primary) hypertension: Secondary | ICD-10-CM | POA: Diagnosis not present

## 2016-08-10 DIAGNOSIS — J90 Pleural effusion, not elsewhere classified: Secondary | ICD-10-CM | POA: Insufficient documentation

## 2016-08-10 DIAGNOSIS — I340 Nonrheumatic mitral (valve) insufficiency: Secondary | ICD-10-CM | POA: Diagnosis not present

## 2016-08-10 DIAGNOSIS — I4821 Permanent atrial fibrillation: Secondary | ICD-10-CM

## 2016-08-10 DIAGNOSIS — R001 Bradycardia, unspecified: Secondary | ICD-10-CM | POA: Diagnosis not present

## 2016-08-10 DIAGNOSIS — I482 Chronic atrial fibrillation: Secondary | ICD-10-CM | POA: Diagnosis not present

## 2016-09-11 ENCOUNTER — Encounter: Payer: Self-pay | Admitting: Family

## 2016-09-15 ENCOUNTER — Ambulatory Visit (HOSPITAL_COMMUNITY): Payer: Medicare Other

## 2016-09-15 ENCOUNTER — Ambulatory Visit: Payer: Medicare Other | Admitting: Family

## 2016-09-17 ENCOUNTER — Other Ambulatory Visit: Payer: Self-pay | Admitting: Family Medicine

## 2016-09-17 NOTE — Progress Notes (Signed)
Cardiology Office Note   Date:  09/18/2016   ID:  Rocklan Gaber, DOB May 06, 1926, MRN [id number]  PCP:  Garret Reddish, MD  Cardiologist:   Minus Breeding, MD  Referring:  Garret Reddish, MD  Chief Complaint  Patient presents with  . Atrial Fibrillation      History of Present Illness: Thomas [name] is a 83 y.o. male who presents for evaluation of atrial fibrillation. He was recently noted to have this at this at his primary care office. This was found incidentally.  After the last visit I did order an echo which demonstrated a mildly reduced EF of 40 - 45% with mild MR.   Since I saw him he feels okay. He continues to have some dyspnea sleeping propped up but this is unchanged. He has a chronic left pleural effusion. He denies any palpitations, presyncope or syncope. He's had no chest pressure, neck or arm discomfort. He's had no weight gain or edema. He tolerates his anticoagulation. He's not had any cough fevers or chills.  Past Medical History:  Diagnosis Date  . Adenomatous colon polyp 2009  . Allergy   . Carotid artery occlusion   . Diverticulosis of colon (without mention of hemorrhage)   . ED (erectile dysfunction)   . Esophageal stricture   . Esophagitis   . GERD (gastroesophageal reflux disease)   . Hiatal hernia   . Hypertension   . Prostate cancer ([place])   . Unspecified gastritis and gastroduodenitis without mention of hemorrhage     Past Surgical History:  Procedure Laterality Date  . ENDARTERECTOMY Left 03/05/2013   Procedure: ENDARTERECTOMY CAROTID;  Surgeon: Conrad Euclid, MD;  Location: [facility];  Service: Vascular;  Laterality: Left;  . ESOPHAGOGASTRODUODENOSCOPY (EGD) WITH ESOPHAGEAL DILATION    . PROSTATE SURGERY     Laser surgery     Current Outpatient Prescriptions  Medication Sig Dispense Refill  . amLODipine (NORVASC) 2.5 MG tablet Take 2 tablets (5 mg total) by mouth daily. 90 tablet 0  . benzonatate (TESSALON) 100 MG capsule Take 1  capsule (100 mg total) by mouth 2 (two) times daily as needed for cough. 20 capsule 0  . ELIQUIS 2.5 MG TABS tablet Take 2.5 mg by mouth 2 (two) times daily.  1  . fluticasone (FLONASE) 50 MCG/ACT nasal spray   0  . omeprazole (PRILOSEC) 40 MG capsule TAKE ONE CAPSULE BY MOUTH ONCE DAILY 60 capsule 7  . oxybutynin (DITROPAN) 5 MG tablet 1 tablet daily when necessary 100 tablet 3  . potassium chloride SA (K-DUR,KLOR-CON) 20 MEQ tablet Take 20 mEq by mouth 2 (two) times daily.    . rosuvastatin (CRESTOR) 10 MG tablet Take 1 tablet (10 mg total) by mouth daily. 92 tablet 3  . amLODipine (NORVASC) 2.5 MG tablet TAKE ONE TABLET BY MOUTH ONCE DAILY 90 tablet 3  . cloNIDine (CATAPRES) 0.1 MG tablet TAKE ONE TABLET BY MOUTH THREE TIMES DAILY 90 tablet 4   No current facility-administered medications for this visit.     Allergies:   Ace inhibitors and Penicillins   ROS:  Please see the history of present illness.   Otherwise, review of systems are positive for balance issues.   All other systems are reviewed and negative.    PHYSICAL EXAM: VS:  BP (!) 180/96   Pulse 84   Ht 5\' 10"  (1.778 m)   Wt 175 lb (79.4 kg)   BMI 25.11 kg/m  , BMI Body mass index is 25.11 kg/m.  GENERAL:  Well appearing NECK:  No jugular venous distention, waveform within normal limits, carotid upstroke brisk and symmetric, no bruits, no thyromegaly LUNGS:  Decreased breath sounds on the left with dullness to percussion. CHEST:  Unremarkable HEART:  PMI not displaced or sustained,S1 and S2 within normal limits, no S3, no clicks, no rubs, no murmurs, irregular ABD:  Flat, positive bowel sounds normal in frequency in pitch, no bruits, no rebound, no guarding, no midline pulsatile mass, no hepatomegaly, no splenomegaly EXT:  2 plus pulses throughout, no edema, no cyanosis no clubbing   EKG:  EKG is not ordered today.    Recent Labs: 02/16/2016: Hemoglobin 13.7; Platelets 265.0; Pro B Natriuretic peptide (BNP)  69.0 07/18/2016: TSH 2.45 08/09/2016: ALT 10; BUN 14; Creat 1.24; Potassium 3.6; Sodium 141    Lipid Panel    Component Value Date/Time   CHOL 199 08/09/2016 1202   TRIG 90 08/09/2016 1202   HDL 37 (L) 08/09/2016 1202   CHOLHDL 5.4 (H) 08/09/2016 1202   VLDL 18 08/09/2016 1202   LDLCALC 144 (H) 08/09/2016 1202   LDLDIRECT 143 (H) 08/09/2016 1202      Wt Readings from Last 3 Encounters:  09/18/16 175 lb (79.4 kg)  08/09/16 181 lb (82.1 kg)  08/04/16 180 lb (81.6 kg)      Other studies Reviewed: Additional studies/ records that were reviewed today include: None Review of the above records demonstrates:     ASSESSMENT AND PLAN:   ATRIAL FIB:   Thomas [name] has a CHA2DS2 - VASc score of 3 with a risk of stroke of 3.3%.  Tolerates anticoagulation. No change in therapy is indicated.  CM:  EF 40 - 45%.  Mild MR.   He has no overt symptoms related to this. At this point I will avoid med titration as discussed below.  HTN:   The blood pressure elevated but this is unusual. He's going to keep a blood pressure diary. If his blood pressure remains elevated when he comes back with diary we could consider adding an ARB although we'll have to watch because he has had some mild renal insufficiency. We need to avoid a beta blocker because he's had bradycardia. I did repeat his blood pressure and it was 180/96 and had come down from the initial reading.  DYSLIPIDEMIA:  His lipids are not well controlled.  He's on Crestor now. He.Lipitor was causing some balance problems. He needs another fasting lipid profile liver enzymes.    Current medicines are reviewed at length with the patient today.  The patient does not have concerns regarding medicines.  The following changes have been made:  None  Labs/ tests ordered today include:    Orders Placed This Encounter  Procedures  . Lipid panel  . Hepatic function panel     Disposition:   FU with APP in one month with his BP readings.  Ronnell Guadalajara, MD  09/18/2016 12:46 PM    [place]

## 2016-09-18 ENCOUNTER — Ambulatory Visit (INDEPENDENT_AMBULATORY_CARE_PROVIDER_SITE_OTHER): Payer: Medicare Other | Admitting: Cardiology

## 2016-09-18 ENCOUNTER — Encounter: Payer: Self-pay | Admitting: Cardiology

## 2016-09-18 VITALS — BP 180/96 | HR 84 | Ht 70.0 in | Wt 175.0 lb

## 2016-09-18 DIAGNOSIS — E785 Hyperlipidemia, unspecified: Secondary | ICD-10-CM | POA: Diagnosis not present

## 2016-09-18 DIAGNOSIS — I482 Chronic atrial fibrillation: Secondary | ICD-10-CM | POA: Diagnosis not present

## 2016-09-18 DIAGNOSIS — Z79899 Other long term (current) drug therapy: Secondary | ICD-10-CM

## 2016-09-18 DIAGNOSIS — I10 Essential (primary) hypertension: Secondary | ICD-10-CM

## 2016-09-18 DIAGNOSIS — I4821 Permanent atrial fibrillation: Secondary | ICD-10-CM

## 2016-09-18 NOTE — Patient Instructions (Signed)
Medication Instructions:  Continue current medciations  Labwork: None Ordered  Testing/Procedures: None Ordered  Follow-Up: Your physician recommends that you schedule a follow-up appointment in: 1 Month with APP   Any Other Special Instructions Will Be Listed Below (If Applicable).  Please keep a diary of your blood pressure and bring with you at next office visit   If you need a refill on your cardiac medications before your next appointment, please call your pharmacy.

## 2016-09-19 ENCOUNTER — Emergency Department (HOSPITAL_COMMUNITY): Payer: Medicare Other

## 2016-09-19 ENCOUNTER — Telehealth: Payer: Self-pay | Admitting: Cardiology

## 2016-09-19 ENCOUNTER — Emergency Department (HOSPITAL_COMMUNITY)
Admission: EM | Admit: 2016-09-19 | Discharge: 2016-09-20 | Disposition: A | Discharge status: Home / Self Care | Payer: Medicare Other | Attending: Emergency Medicine | Admitting: Emergency Medicine

## 2016-09-19 ENCOUNTER — Other Ambulatory Visit: Payer: Self-pay | Admitting: Cardiology

## 2016-09-19 ENCOUNTER — Other Ambulatory Visit: Payer: Self-pay

## 2016-09-19 ENCOUNTER — Encounter (HOSPITAL_COMMUNITY): Payer: Self-pay | Admitting: Emergency Medicine

## 2016-09-19 DIAGNOSIS — Z8546 Personal history of malignant neoplasm of prostate: Secondary | ICD-10-CM | POA: Diagnosis not present

## 2016-09-19 DIAGNOSIS — Z8673 Personal history of transient ischemic attack (TIA), and cerebral infarction without residual deficits: Secondary | ICD-10-CM | POA: Diagnosis not present

## 2016-09-19 DIAGNOSIS — I10 Essential (primary) hypertension: Secondary | ICD-10-CM | POA: Diagnosis present

## 2016-09-19 DIAGNOSIS — Z79899 Other long term (current) drug therapy: Secondary | ICD-10-CM | POA: Diagnosis not present

## 2016-09-19 DIAGNOSIS — Z7901 Long term (current) use of anticoagulants: Secondary | ICD-10-CM | POA: Insufficient documentation

## 2016-09-19 LAB — CBC
HCT: 35.2 % — ABNORMAL LOW (ref 39.0–52.0)
Hemoglobin: 11.7 g/dL — ABNORMAL LOW (ref 13.0–17.0)
MCH: 31 pg (ref 26.0–34.0)
MCHC: 33.2 g/dL (ref 30.0–36.0)
MCV: 93.4 fL (ref 78.0–100.0)
PLATELETS: 263 10*3/uL (ref 150–400)
RBC: 3.77 MIL/uL — ABNORMAL LOW (ref 4.22–5.81)
RDW: 14 % (ref 11.5–15.5)
WBC: 5.4 10*3/uL (ref 4.0–10.5)

## 2016-09-19 LAB — BASIC METABOLIC PANEL
Anion gap: 11 (ref 5–15)
BUN: 16 mg/dL (ref 6–20)
CO2: 24 mmol/L (ref 22–32)
CREATININE: 1.34 mg/dL — AB (ref 0.61–1.24)
Calcium: 9 mg/dL (ref 8.9–10.3)
Chloride: 102 mmol/L (ref 101–111)
GFR calc Af Amer: 53 mL/min — ABNORMAL LOW (ref >60)
GFR, EST NON AFRICAN AMERICAN: 46 mL/min — AB (ref >60)
GLUCOSE: 139 mg/dL — AB (ref 65–99)
Potassium: 3 mmol/L — ABNORMAL LOW (ref 3.5–5.1)
SODIUM: 137 mmol/L (ref 135–145)

## 2016-09-19 LAB — I-STAT TROPONIN, ED: Troponin i, poc: 0.01 ng/mL (ref 0.00–0.08)

## 2016-09-19 MED ORDER — POTASSIUM CHLORIDE CRYS ER 20 MEQ PO TBCR
40.0000 meq | EXTENDED_RELEASE_TABLET | Freq: Once | ORAL | Status: AC
  Administered 2016-09-19: 40 meq via ORAL
  Filled 2016-09-19: qty 2

## 2016-09-19 MED ORDER — HYDRALAZINE HCL 10 MG PO TABS
10.0000 mg | ORAL_TABLET | Freq: Once | ORAL | Status: AC
  Administered 2016-09-19: 10 mg via ORAL
  Filled 2016-09-19: qty 1

## 2016-09-19 NOTE — Telephone Encounter (Signed)
Thomas [name] notified that Dr. Sherren Mocha refilled Rx cloNIDine (CATAPRES) 0.1 MG tablet 90 tablet 4 09/18/2016    Sig: TAKE ONE TABLET BY MOUTH THREE TIMES DAILY   Notes to Pharmacy: Please consider 90 day supplies to promote better adherence   E-Prescribing Status: Receipt confirmed by pharmacy (09/18/2016 9:51 AM EST)   Pharmacy   [place], [place]

## 2016-09-19 NOTE — Telephone Encounter (Signed)
New Message     Per daughter his bp is up (per daughter pt is at hospital with his wife)   Pt c/o BP issue: STAT if pt c/o blurred vision, one-sided weakness or slurred speech  1. What are your last 5 BP readings? 220/126  2. Are you having any other symptoms (ex. Dizziness, headache, blurred vision, passed out)? Very tired   3. What is your BP issue? 220/126

## 2016-09-19 NOTE — ED Provider Notes (Signed)
[facility] DEPT Provider Note   CSN: [id number] Arrival date & time: 09/19/16  1654     History   Chief Complaint Chief Complaint  Patient presents with  . Hypertension  . Shortness of Breath    HPI Thomas [name] is a 83 y.o. male.  HPI    Thomas [name] is a 83 y.o. male, with a history of HTN and A. fib, presenting to the ED with hypertension noted this afternoon. Pt was at his wife's doctor's office accompanying his wife during an appointment. They took his BP because they thought he didn't look like he felt well. It was 220/126 at that time. States he has felt off balance since last summer.    Called cardiologist, they told him to take an additional clonidine, recheck pressure in 15 minutes, and if his systolic did not go below 180, to come to the ED. BP was then 195/106. Saw Dr. Percival Spanish, cardiologist, yesterday and his BP was 180/96 during that visit. Pt denies current complaints. Denies CP, SOB, dizziness, LOC, HA, vision changes, N/V, or any other complaints.     Past Medical History:  Diagnosis Date  . Adenomatous colon polyp 2009  . Allergy   . Carotid artery occlusion   . Diverticulosis of colon (without mention of hemorrhage)   . ED (erectile dysfunction)   . Esophageal stricture   . Esophagitis   . GERD (gastroesophageal reflux disease)   . Hiatal hernia   . Hypertension   . Prostate cancer ([place])   . Unspecified gastritis and gastroduodenitis without mention of hemorrhage     Patient Active Problem List   Diagnosis Date Noted  . Alcohol abuse 08/04/2016  . Permanent atrial fibrillation ([place]) 07/21/2016  . Pleural effusion, left 02/16/2016  . Dyspnea 02/16/2016  . Perforation of right tympanic membrane 11/24/2013  . Carotid stenosis 02/21/2013  . TIA (transient ischemic attack) 02/17/2013  . GERD (gastroesophageal reflux disease) 05/17/2011  . Esophageal dysphagia 05/17/2011  . Stricture and stenosis of esophagus 08/29/2010  . Allergic  rhinitis 01/16/2008  . 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 PROSTATE CANCER, HX OF 03/26/2007  . Hyperlipidemia 03/20/2007  . Essential hypertension 03/20/2007    Past Surgical History:  Procedure Laterality Date  . ENDARTERECTOMY Left 03/05/2013   Procedure: ENDARTERECTOMY CAROTID;  Surgeon: Conrad Presquille, MD;  Location: [facility];  Service: Vascular;  Laterality: Left;  . ESOPHAGOGASTRODUODENOSCOPY (EGD) WITH ESOPHAGEAL DILATION    . PROSTATE SURGERY     Laser surgery       Home Medications    Prior to Admission medications   Medication Sig Start Date End Date Taking? Authorizing Provider  amLODipine (NORVASC) 2.5 MG tablet Take 2 tablets (5 mg total) by mouth daily. 07/21/16  Yes Betty G Martinique, MD  benzonatate (TESSALON) 100 MG capsule Take 1 capsule (100 mg total) by mouth 2 (two) times daily as needed for cough. 08/04/16  Yes Marin Olp, MD  cloNIDine (CATAPRES) 0.1 MG tablet TAKE ONE TABLET BY MOUTH THREE TIMES DAILY 09/18/16  Yes Dorena Cookey, MD  ELIQUIS 2.5 MG TABS tablet Take 2.5 mg by mouth 2 (two) times daily. 09/11/16  Yes Historical Provider, MD  fluticasone Asencion Islam) 50 MCG/ACT nasal spray  02/02/16  Yes Historical Provider, MD  omeprazole (PRILOSEC) 40 MG capsule TAKE ONE CAPSULE BY MOUTH ONCE DAILY 05/23/16  Yes Dorena Cookey, MD  oxybutynin (DITROPAN) 5 MG tablet 1 tablet daily when necessary 05/18/15  Yes Dorena Cookey, MD  potassium  chloride SA (K-DUR,KLOR-CON) 20 MEQ tablet Take 20 mEq by mouth 2 (two) times daily.   Yes Historical Provider, MD  rosuvastatin (CRESTOR) 10 MG tablet Take 1 tablet (10 mg total) by mouth daily. 05/29/16  Yes Marin Olp, MD  amLODipine (NORVASC) 2.5 MG tablet TAKE ONE TABLET BY MOUTH ONCE DAILY Patient not taking: Reported on 09/19/2016 09/18/16   Dorena Cookey, MD    Family History Family History  Problem Relation Age of Onset  . Heart disease Father     unclear specifics  . Cancer Mother   . Breast cancer Sister     . Hyperlipidemia Daughter   . Hypertension Daughter   . Hypertension Son     Social History Social History  Substance Use Topics  . Smoking status: Never Smoker  . Smokeless tobacco: Never Used  . Alcohol use 0.0 oz/week     Comment: daily--one alcohol drink a day     Allergies   Ace inhibitors and Penicillins   Review of Systems Review of Systems  Constitutional: Negative for chills, diaphoresis and fever.  HENT: Negative for nosebleeds.   Eyes: Negative for visual disturbance.  Respiratory: Negative for cough and shortness of breath.   Cardiovascular: Negative for chest pain.       Hypertension  Gastrointestinal: Negative for abdominal pain, nausea and vomiting.  Skin: Negative for pallor.  Neurological: Negative for dizziness, syncope, weakness, light-headedness, numbness and headaches.  Psychiatric/Behavioral: Negative for confusion.  All other systems reviewed and are negative.    Physical Exam Updated Vital Signs BP 161/92   Pulse 81   Temp 98.1 F (36.7 C) (Oral)   Resp 21   SpO2 98%   Physical Exam  Constitutional: He is oriented to person, place, and time. He appears well-developed and well-nourished. No distress.  HENT:  Head: Normocephalic and atraumatic.  Mouth/Throat: Oropharynx is clear and moist.  Eyes: Conjunctivae and EOM are normal. Pupils are equal, round, and reactive to light.  Neck: Normal range of motion. Neck supple.  Cardiovascular: Normal rate, regular rhythm, normal heart sounds and intact distal pulses.   Pulmonary/Chest: Effort normal and breath sounds normal. No respiratory distress.  Abdominal: Soft. There is no tenderness. There is no guarding.  Musculoskeletal: He exhibits no edema.  Lymphadenopathy:    He has no cervical adenopathy.  Neurological: He is alert and oriented to person, place, and time.  No sensory deficits. Strength 5/5 in all extremities. No gait disturbance. Coordination intact including heel to shin and  finger to nose. Cranial nerves III-XII grossly intact. No facial droop.   Skin: Skin is warm and dry. He is not diaphoretic.  Psychiatric: He has a normal mood and affect. His behavior is normal.  Nursing note and vitals reviewed.    ED Treatments / Results  Labs (all labs ordered are listed, but only abnormal results are displayed) Labs Reviewed  BASIC METABOLIC PANEL - Abnormal; Notable for the following:       Result Value   Potassium 3.0 (*)    Glucose, Bld 139 (*)    Creatinine, Ser 1.34 (*)    GFR calc non Af Amer 46 (*)    GFR calc Af Amer 53 (*)    All other components within normal limits  CBC - Abnormal; Notable for the following:    RBC 3.77 (*)    Hemoglobin 11.7 (*)    HCT 35.2 (*)    All other components within normal limits  Randolm Idol, ED  Randolm Idol, ED    EKG  EKG Interpretation  Date/Time:  Tuesday September 19 2016 17:44:22 EST Ventricular Rate:  85 PR Interval:    QRS Duration: 108 QT Interval:  392 QTC Calculation: 466 R Axis:     Text Interpretation:  Atrial fibrillation Moderate voltage criteria for LVH, may be normal variant ST & T wave abnormality, consider inferior ischemia Prolonged QT Abnormal ECG Wandering baseline - will repeat No STEMI Confirmed by [facility] MD, [place] [phone]) on 09/19/2016 11:56:52 PM       EKG Interpretation  Date/Time:  Wednesday September 20 2016 00:09:49 EST Ventricular Rate:  67 PR Interval:    QRS Duration: 116 QT Interval:  417 QTC Calculation: 441 R Axis:   11 Text Interpretation:  Atrial fibrillation Incomplete right bundle branch block LVH with IVCD and secondary repol abnrm Wandering baseline resolve when compared to prior.  No ST depressions in inferior leads.  No STEMI Confirmed by Sherry Ruffing MD, [place] [phone]) on 09/20/2016 12:13:15 AM       Radiology Dg Chest 2 View  Result Date: 09/19/2016 CLINICAL DATA:  Shortness of breath for 1 week, elevated blood pressure, history hypertension,  GERD, prostate cancer EXAM: CHEST  2 VIEW COMPARISON:  07/21/2016 FINDINGS: Upper normal size of cardiac silhouette. Mediastinal contours normal. Slight pulmonary vascular congestion. Atherosclerotic calcification aorta. Large LEFT pleural effusion significantly increased since previous exam with subtotal atelectasis of LEFT lung. RIGHT lung clear with central peribronchial thickening noted. No pneumothorax or acute osseous findings. IMPRESSION: Increase in large LEFT pleural effusion and subtotal atelectasis of LEFT lung since prior study. Electronically Signed   By: Lavonia Dana M.D.   On: 09/19/2016 17:47    Procedures Procedures (including critical care time)  Medications Ordered in ED Medications  potassium chloride SA (K-DUR,KLOR-CON) CR tablet 40 mEq (40 mEq Oral Given 09/19/16 2307)  hydrALAZINE (APRESOLINE) tablet 10 mg (10 mg Oral Given 09/19/16 2307)     Initial Impression / Assessment and Plan / ED Course  I have reviewed the triage vital signs and the nursing notes.  Pertinent labs & imaging results that were available during my care of the patient were reviewed by me and considered in my medical decision making (see chart for details).        Pt has been symptom-free and is nontoxic appearing. The worsening pleural effusion is noted, but appears to be asymptomatic. Patient ambulated while maintaining adequate SPO2 and without onset of chest pain, shortness breath, or any other symptoms. Patient to follow up with cardiology tomorrow. Patient reassessed multiple times while here in the ED and remained symptom-free. Delta troponins negative and no acute abnormality on repeat EKG. Return precautions discussed. Patient and patient's family at the bedside voice understanding of all instructions and are comfortable with discharge.  Findings and plan of care discussed with Courtney Paris, MD. Dr. Sherry Ruffing personally evaluated and examined this patient.   Vitals:   09/19/16 2208  09/19/16 2215 09/19/16 2230 09/19/16 2245  BP:  (!) 170/108 (!) 185/111 (!) 190/122  Pulse:  71 70 70  Resp:  26  24  Temp:      TempSrc:      SpO2: 98% 98% 98% 97%   Vitals:   09/19/16 2245 09/19/16 2346 09/19/16 2356 09/19/16 2358  BP: (!) 190/122 (!) 182/102 (!) 191/128 161/97  Pulse: 70 72 75 75  Resp: 24  22 (!) 31  Temp:      TempSrc:  SpO2: 97% 97% 99% 98%   Vitals:   09/19/16 2346 09/19/16 2356 09/19/16 2358 09/20/16 0045  BP: (!) 182/102 (!) 191/128 161/97 156/97  Pulse: 72 75 75 67  Resp:  22 (!) 31 (!) 28  Temp:      TempSrc:      SpO2: 97% 99% 98% 98%        Final Clinical Impressions(s) / ED Diagnoses   Final diagnoses:  Hypertension, unspecified type    New Prescriptions Discharge Medication List as of 09/19/2016 11:49 PM       Lorayne Bender, PA-C 09/20/16 0140    Gwenyth Allegra Tegeler, MD 09/20/16 1128

## 2016-09-19 NOTE — Telephone Encounter (Signed)
Discussed w Dr. Percival Spanish. Recommendation given to take extra tab of clonidine today, wait 30 mins & recheck BP, if symptoms not improved/BP still elevated seek urgent care evaluation. Update [place] tomorrow w outcome of urgent care visit. Daughter voices understanding of instructions.

## 2016-09-19 NOTE — Telephone Encounter (Signed)
°*  STAT* If patient is at the pharmacy, call can be transferred to refill team.   1. Which medications need to be refilled? (please list name of each medication and dose if known) Clonidine-need this today  2. Which pharmacy/location (including street and city if local pharmacy) is medication to be sent to?[place] 3. Do they need a 30 day or 90 day supply? 30 and refills-please call daughter once you called this in

## 2016-09-19 NOTE — Telephone Encounter (Signed)
Spoke to caller, daughter of patient. She notes she's not with patient, but called her brother on party line, who is with the patient and the patient's wife. Patient's wife went to [facility] to get an iron shot today, patient himself looked "under the weather", so the RN at [facility] took a BP reading for him there, he was 220/126. He denied missed doses of any medications.  Saw Dr. Percival Spanish yesterday - no recommendation to start new medications at this time, but to follow BP readings.  Pt seeks advice from Dr. Percival Spanish. From discussion w daughter, does not sound like he was instructed by hospital staff to go to ED, she states he would refuse to go regardless.  Will discuss w provider to see if any advice.  I've advised caller to have patient recheck BP once home.

## 2016-09-19 NOTE — ED Triage Notes (Addendum)
Pt reports bp in the 200's stated that cardiologist instructed pt to take a clonidine and come to ED if bp did not get to 180/100. Pt also reports sob x1 week. Pt a/ox4, resp e/u, nad.

## 2016-09-19 NOTE — ED Notes (Signed)
While ambulating the pt, pt's O2 percent was between 93% and 95%. Pt was primarily at 94%. Pt's pulse became elevated to around 121. Pt was asked how he felt while ambulating and pt stated, "I felt normal". [name] member stated that pt will say anything "because he doesn't like hospitals". Dr. Sherry Ruffing informed.

## 2016-09-19 NOTE — Discharge Instructions (Signed)
There was some worsening in the pleural effusion on the chest xray today, but it does not seem to be causing symptoms. Please follow up with the cardiologist tomorrow.

## 2016-09-20 ENCOUNTER — Encounter: Payer: Self-pay | Admitting: Cardiology

## 2016-09-20 ENCOUNTER — Ambulatory Visit (INDEPENDENT_AMBULATORY_CARE_PROVIDER_SITE_OTHER): Payer: Medicare Other | Admitting: Cardiology

## 2016-09-20 VITALS — BP 169/93 | HR 93 | Ht 70.0 in | Wt 175.6 lb

## 2016-09-20 DIAGNOSIS — N289 Disorder of kidney and ureter, unspecified: Secondary | ICD-10-CM | POA: Diagnosis not present

## 2016-09-20 DIAGNOSIS — I10 Essential (primary) hypertension: Secondary | ICD-10-CM

## 2016-09-20 LAB — I-STAT TROPONIN, ED: TROPONIN I, POC: 0 ng/mL (ref 0.00–0.08)

## 2016-09-20 MED ORDER — VALSARTAN 40 MG PO TABS: 40.0000 mg | ORAL_TABLET | Freq: Every day | ORAL | 0 refills | Status: DC

## 2016-09-20 NOTE — Patient Instructions (Addendum)
Medication Instructions:  START Valsartan 40mg  Take 1 tablet once a day  Labwork: Your physician recommends that you return for lab work in: 1week-BMET   Testing/Procedures: None   Follow-Up: Your physician recommends that you schedule a follow-up appointment in: 2 weeks in hypertension clinic,  keep appointment with Extender Almyra Deforest, PA   Any Other Special Instructions Will Be Listed Below (If Applicable).     If you need a refill on your cardiac medications before your next appointment, please call your pharmacy.

## 2016-09-20 NOTE — Progress Notes (Signed)
09/20/2016 Thomas [name]   04/05/1926  [id number]  Primary Physician Garret Reddish, MD Primary Cardiologist: Dr. Percival Spanish    Reason for Visit/CC: Post ED F/u for HTN  HPI:  Thomas [name] is a 83 year old male, followed by Dr. Percival Spanish, with a history of atrial fibrillation with a chads score of 3 with risk of stroke or 3.3%. He is on chronic anticoagulation therapy with Eliquis. He also has a history of cardiomyopathy with echocardiogram showing an ejection fraction of 40-45% with mild MR. He also has a history of chronic left pleural effusion, hypertension and dyslipidemia, as well as chronic renal insufficiency.   He was recently seen by Dr. Percival Spanish in clinic 2 days ago on 09/18/2016. At that time, he denied any symptoms however his initial blood pressure was elevated at 180/96. It was outlined in Dr. Rosezella [place] note that his blood pressure was rechecked prior to patient leaving and his blood pressure had improved from initial reading. Dr. Percival Spanish had instructed him to keep a diary of his blood pressure readings at home and to return for 1 month follow-up with an APP for reassessment. It was outlined in Dr. Rosezella Florida note that if his blood pressure was still elevated, we could consider adding an ARB, although we would have to watch this because he has some mild renal insufficiency. Dr. Michelle Piper recommended that beta blockers be avoided given history of bradycardia.  The following day after his office visit on February 6, presented to the [facility] emergency department with a chief complaint of recurrent issues with his BP. His PCP had advised him to take clonidine, however he had no significant improvement. He was also symptomatic, noting that he felt very poorly, prompting him to go to the ED.   It was noted that his SBP was in the 200s on arrival. Per ED note, "Workup revealed slightly worsened effusion and atelectasis in lungs however, patient ambulated on pulse oximeter and had no hypoxia.  Patient remained symptom-free during his ED stay. Patient was given a dose of his blood pressure medication and his blood pressure.  Improved. Patient continued to feel well. Patient had negative troponins and EKG showed no evidence of ischemia on repeat".  He was released from the ED and advised to f/u in our clinic for repeat assessment. He is here today with his son and daughter. His BP is elevated at 169/93. He is asymptomatic. He reports full medication compliance but did note that he missed a dose or 2 of clonidine last week. We discussed dietary factors. He reports that he refrains from a diet high in sodium. No caffeine excess nor tobacco use. He reports occasional social ETOH use. No increased life stressors.     Current Meds  Medication Sig  . amLODipine (NORVASC) 2.5 MG tablet Take 2 tablets (5 mg total) by mouth daily.  Marland Kitchen amLODipine (NORVASC) 2.5 MG tablet TAKE ONE TABLET BY MOUTH ONCE DAILY  . benzonatate (TESSALON) 100 MG capsule Take 1 capsule (100 mg total) by mouth 2 (two) times daily as needed for cough.  . cloNIDine (CATAPRES) 0.1 MG tablet TAKE ONE TABLET BY MOUTH THREE TIMES DAILY  . ELIQUIS 2.5 MG TABS tablet Take 2.5 mg by mouth 2 (two) times daily.  . fluticasone (FLONASE) 50 MCG/ACT nasal spray   . omeprazole (PRILOSEC) 40 MG capsule TAKE ONE CAPSULE BY MOUTH ONCE DAILY  . oxybutynin (DITROPAN) 5 MG tablet 1 tablet daily when necessary  . potassium chloride SA (K-DUR,KLOR-CON) 20 MEQ tablet Take 20  mEq by mouth 2 (two) times daily.  . rosuvastatin (CRESTOR) 10 MG tablet Take 1 tablet (10 mg total) by mouth daily.   Allergies  Allergen Reactions  . Ace Inhibitors Hives  . Penicillins Swelling    Swelling  of the face. Has patient had a PCN reaction causing immediate rash, facial/tongue/throat swelling, SOB or lightheadedness with hypotension: YES Has patient had a PCN reaction causing severe rash involving mucus membranes or skin necrosis: NO Has patient had a PCN  reaction that required hospitalizationNO Has patient had a PCN reaction occurring within the last 10 years: NO If all of the above answers are "NO", then may proceed with Cephalosporin use.   Past Medical History:  Diagnosis Date  . Adenomatous colon polyp 2009  . Allergy   . Carotid artery occlusion   . Diverticulosis of colon (without mention of hemorrhage)   . ED (erectile dysfunction)   . Esophageal stricture   . Esophagitis   . GERD (gastroesophageal reflux disease)   . Hiatal hernia   . Hypertension   . Prostate cancer ([place])   . Unspecified gastritis and gastroduodenitis without mention of hemorrhage    Family History  Problem Relation Age of Onset  . Heart disease Father     unclear specifics  . Cancer Mother   . Breast cancer Sister   . Hyperlipidemia Daughter   . Hypertension Daughter   . Hypertension Son    Past Surgical History:  Procedure Laterality Date  . ENDARTERECTOMY Left 03/05/2013   Procedure: ENDARTERECTOMY CAROTID;  Surgeon: Conrad Neilton, MD;  Location: [facility];  Service: Vascular;  Laterality: Left;  . ESOPHAGOGASTRODUODENOSCOPY (EGD) WITH ESOPHAGEAL DILATION    . PROSTATE SURGERY     Laser surgery   Social History   Social History  . Marital status: Married    Spouse name: N/A  . Number of children: N/A  . Years of education: N/A   Occupational History  . Not on file.   Social History Main Topics  . Smoking status: Never Smoker  . Smokeless tobacco: Never Used  . Alcohol use 0.0 oz/week     Comment: daily--one alcohol drink a day  . Drug use: No  . Sexual activity: Not on file   Other Topics Concern  . Not on file   Social History Narrative   Married 1948. 6 children. 9 grandkids. 2 greatgrandkids.  (One son was killed in the [place])      [name]. [employer]- honorable discharge.       Hobbies: golfing about twice a month.      Review of Systems: General: negative for chills, fever, night sweats or  weight changes.  Cardiovascular: negative for chest pain, dyspnea on exertion, edema, orthopnea, palpitations, paroxysmal nocturnal dyspnea or shortness of breath Dermatological: negative for rash Respiratory: negative for cough or wheezing Urologic: negative for hematuria Abdominal: negative for nausea, vomiting, diarrhea, bright red blood per rectum, melena, or hematemesis Neurologic: negative for visual changes, syncope, or dizziness All other systems reviewed and are otherwise negative except as noted above.   Physical Exam:  Blood pressure (!) 169/93, pulse 93, height 5\' 10"  (1.778 m), weight 175 lb 9.6 oz (79.7 kg).  General appearance: alert, cooperative and no distress Neck: no carotid bruit and no JVD Lungs: clear to auscultation bilaterally Heart: regular rate and rhythm, S1, S2 normal, no murmur, click, rub or gallop Extremities: extremities normal, atraumatic, no cyanosis or edema Pulses: 2+ and symmetric Skin: Skin  color, texture, turgor normal. No rashes or lesions Neurologic: Grossly normal  EKG not performed   ASSESSMENT AND PLAN:   1. HTN: on amlodipine and clonidine. He was seen by Dr. Percival Spanish 2 days ago with initial elevated BP that improved by the end of his visit. Dr. Percival Spanish had outlined to consider low dose ARB if recurrent issues, however to use caution given his chronic renal insufficiency (SCr 1.3). He has had continued problems since then with elevated BP readings leading to ED evaluation, with systolic BP readings in the 200s. BP today is 169/93. He is asymptomatic currently. Allergies have been reviewed. He has a listed allergy of hives with ACE-Is. I've discussed case with our pharmacist. He has no h/o angioedema, thus we feel that it is ok to try a low dose ARB. Pt advised to contact our office if he develops any adverse side effects. We will start him on Valsartan, 40 mg, daily. We will check a repeat BMP in 1 week to reassess renal function and K as well as  f/u in our HTN clinic in 2 weeks. Pt advised to monitor BP closely at home and to keep a log and bring report back to clinic in 2 weeks. He was advised to be fully compliant with clonidine to prevent rebound HTN.   2. Atrial Fibrillation: CHA2DS2 - VASc score of 3 with a risk of stroke of 3.3%. He is on Eliquis for a/c. RRR on physical exam. No symptoms of palpitations or dyspnea. He is not on BB nor Cardizem given issues with bradycardia   3. Cardiomyopathy:  EF 40-45%.  Mild MR. He has no overt symptoms related to this. We will add low dose ARB to help with BP. No BBs given h/o bradycardia.   4. Chronic Renal Insufficiency: BMP yesterday in ED showed a Scr of 1.34. K was 3.0. We are adding Valsartan as outlined above. We will obtain a repeat BMP in 1 week to reassess renal function.   5. HLD: on statin therapy with Crestor.     Lyda Jester PA-C 09/20/2016 1:36 PM

## 2016-09-28 LAB — BASIC METABOLIC PANEL
BUN: 15 mg/dL (ref 7–25)
CALCIUM: 8.8 mg/dL (ref 8.6–10.3)
CHLORIDE: 98 mmol/L (ref 98–110)
CO2: 26 mmol/L (ref 20–31)
Creat: 1.35 mg/dL — ABNORMAL HIGH (ref 0.70–1.11)
GLUCOSE: 106 mg/dL — AB (ref 65–99)
POTASSIUM: 3.3 mmol/L — AB (ref 3.5–5.3)
SODIUM: 137 mmol/L (ref 135–146)

## 2016-10-04 ENCOUNTER — Ambulatory Visit (INDEPENDENT_AMBULATORY_CARE_PROVIDER_SITE_OTHER): Payer: Medicare Other | Admitting: Pharmacist

## 2016-10-04 VITALS — BP 145/68 | HR 78

## 2016-10-04 DIAGNOSIS — I10 Essential (primary) hypertension: Secondary | ICD-10-CM

## 2016-10-04 NOTE — Patient Instructions (Addendum)
Return for a  follow up appointment in as needed (pharmacist will call for phone follow up in 1 week)  Your blood pressure today is 145/68 pulse 78  Check your blood pressure at home daily (if able) and keep record of the readings.  Take your BP meds as follows: amlodipine 5mg  daily Clonidine 0.1mg  three times a day Valsartan 40mg  daily  **Call clinic if Blood pressure above 150 consistent**  Bring all of your meds, your BP cuff and your record of home blood pressures to your next appointment.  Exercise as you're able, try to walk approximately 30 minutes per day.  Keep salt intake to a minimum, especially watch canned and prepared boxed foods.  Eat more fresh fruits and vegetables and fewer canned items.  Avoid eating in fast food restaurants.    HOW TO TAKE YOUR BLOOD PRESSURE: . Rest 5 minutes before taking your blood pressure. .  Don't smoke or drink caffeinated beverages for at least 30 minutes before. . Take your blood pressure before (not after) you eat. . Sit comfortably with your back supported and both feet on the floor (don't cross your legs). . Elevate your arm to heart level on a table or a desk. . Use the proper sized cuff. It should fit smoothly and snugly around your bare upper arm. There should be enough room to slip a fingertip under the cuff. The bottom edge of the cuff should be 1 inch above the crease of the elbow. . Ideally, take 3 measurements at one sitting and record the average.

## 2016-10-04 NOTE — Progress Notes (Signed)
Patient ID: Thomas [name]                 DOB: Jan 25, 1926                      MRN: [id number]     HPI: Thomas [name] is a 83 y.o. male patient of Dr. Percival Spanish referred by B. Simmons NP to HTN clinic. PMH includes atrial fibrillation with a CHA2DS2 - VASc score of 3, cardiomyopathy, history of chronic left pleural effusion, hypertension, dyslipidemia, and chronic renal insufficiency. Valsartan 40mg  po daily was initiated on 09/20/16 by NP-C to improve BP management.  Patient presents today for HTN follow up. Denies headaches, fatigue, swelling or any other problem.  Patient stated BP at home is better but no records available for assessment today.  Current HTN meds:  amlodipine 5mg  daily  Clonidine 0.1mg  three times a day Valsartan 40mg  daily  Previously tried:  Verapamil 240mg  daily  BP goal: <140/90  Family History: Father - Heart diease; HTN - son; HTN and hyperlipidemia - daughter  Social History: denies smoking, smokeless tobacco and alcohol  Diet: mainly home meals, no canned products, no added salt to food.  Exercise: golf and swimming twice daily  Home BP readings: none available   Wt Readings from Last 3 Encounters:  09/20/16 175 lb 9.6 oz (79.7 kg)  09/18/16 175 lb (79.4 kg)  08/09/16 181 lb (82.1 kg)   BP Readings from Last 3 Encounters:  10/04/16 (!) 145/68  09/20/16 (!) 169/93  09/20/16 156/97   Pulse Readings from Last 3 Encounters:  10/04/16 78  09/20/16 93  09/20/16 67    Renal function: Estimated Creatinine Clearance: 39.1 mL/min (by C-G formula based on SCr of 1.35 mg/dL (H)).  Past Medical History:  Diagnosis Date  . Adenomatous colon polyp 2009  . Allergy   . Carotid artery occlusion   . Diverticulosis of colon (without mention of hemorrhage)   . ED (erectile dysfunction)   . Esophageal stricture   . Esophagitis   . GERD (gastroesophageal reflux disease)   . Hiatal hernia   . Hypertension   . Prostate cancer ([place])   . Unspecified  gastritis and gastroduodenitis without mention of hemorrhage     Current Outpatient Prescriptions on File Prior to Visit  Medication Sig Dispense Refill  . amLODipine (NORVASC) 2.5 MG tablet Take 2 tablets (5 mg total) by mouth daily. 90 tablet 0  . amLODipine (NORVASC) 2.5 MG tablet TAKE ONE TABLET BY MOUTH ONCE DAILY 90 tablet 3  . benzonatate (TESSALON) 100 MG capsule Take 1 capsule (100 mg total) by mouth 2 (two) times daily as needed for cough. 20 capsule 0  . cloNIDine (CATAPRES) 0.1 MG tablet TAKE ONE TABLET BY MOUTH THREE TIMES DAILY 90 tablet 4  . ELIQUIS 2.5 MG TABS tablet Take 2.5 mg by mouth 2 (two) times daily.  1  . fluticasone (FLONASE) 50 MCG/ACT nasal spray   0  . omeprazole (PRILOSEC) 40 MG capsule TAKE ONE CAPSULE BY MOUTH ONCE DAILY 60 capsule 7  . oxybutynin (DITROPAN) 5 MG tablet 1 tablet daily when necessary 100 tablet 3  . potassium chloride SA (K-DUR,KLOR-CON) 20 MEQ tablet Take 20 mEq by mouth 2 (two) times daily.    . rosuvastatin (CRESTOR) 10 MG tablet Take 1 tablet (10 mg total) by mouth daily. 92 tablet 3  . valsartan (DIOVAN) 40 MG tablet Take 1 tablet (40 mg total) by mouth daily. 30 tablet 0  No current facility-administered medications on file prior to visit.     Allergies  Allergen Reactions  . Ace Inhibitors Hives  . Penicillins Swelling    Swelling  of the face. Has patient had a PCN reaction causing immediate rash, facial/tongue/throat swelling, SOB or lightheadedness with hypotension: YES Has patient had a PCN reaction causing severe rash involving mucus membranes or skin necrosis: NO Has patient had a PCN reaction that required hospitalizationNO Has patient had a PCN reaction occurring within the last 10 years: NO If all of the above answers are "NO", then may proceed with Cephalosporin use.    Blood pressure (!) 145/68, pulse 78, SpO2 97 %.  Essential hypertension:  BP today greatly improved from last office reading of 169/93, but still  slightly above goal.  Patient tolerating current therapy without problems.  Also noted recent BMET done 09/27/16 show stable renal function.  No BP records from home available today for assessment, but patient stated systolic BP is better (0000000 to 150s mostly).  Will continue current therapy with valsartan 40mg  daily, amlodipine 5mg  daily and clonidine 0.1mg  TID.  Patient to bring BP records from home for next follow up visit, also instructed to bring Home monitoring device to determine accuracy.   I will call patient in 1 week for phone follow up and next visit with NP already scheduled for 10/16/2016.  HTN clinic will continue to follow as needed.   [name] Washabaugh Rodriguez-Guzman PharmD, [place] [id number] 10/04/2016 5:07 PM

## 2016-10-12 ENCOUNTER — Telehealth: Payer: Self-pay | Admitting: Pharmacist

## 2016-10-12 NOTE — Telephone Encounter (Signed)
Patient stated he is feeling "real good"    Most of the conversation was with his wife - she was unable to find BP reading at the time but stated systolic BP staying around 140s so far.   Reminded to bring records of home BP reading to f/u office visit on 10/16/2016

## 2016-10-16 ENCOUNTER — Ambulatory Visit (INDEPENDENT_AMBULATORY_CARE_PROVIDER_SITE_OTHER): Payer: Medicare Other | Admitting: Physician Assistant

## 2016-10-16 ENCOUNTER — Encounter: Payer: Self-pay | Admitting: Physician Assistant

## 2016-10-16 VITALS — BP 138/92 | HR 68 | Ht 71.0 in | Wt 174.8 lb

## 2016-10-16 DIAGNOSIS — I480 Paroxysmal atrial fibrillation: Secondary | ICD-10-CM

## 2016-10-16 DIAGNOSIS — E785 Hyperlipidemia, unspecified: Secondary | ICD-10-CM | POA: Diagnosis not present

## 2016-10-16 DIAGNOSIS — I10 Essential (primary) hypertension: Secondary | ICD-10-CM | POA: Diagnosis not present

## 2016-10-16 DIAGNOSIS — J90 Pleural effusion, not elsewhere classified: Secondary | ICD-10-CM

## 2016-10-16 MED ORDER — AMLODIPINE BESYLATE 5 MG PO TABS: 5.0000 mg | ORAL_TABLET | Freq: Every day | ORAL | 3 refills | Status: DC

## 2016-10-16 NOTE — Patient Instructions (Signed)
Medication Instructions: Increase Amlodipine to 5 mg daily.   Follow-Up: Your physician recommends that you schedule a follow-up appointment in: 3 months with Dr. Percival Spanish.   Any Other Special Instructions will be listed below:  Your physician has requested that you regularly monitor and record your blood pressure readings at home. Please use the same machine at the same time of day to check your readings and record them to bring to your follow-up visit. If your systolic blood pressure (top number) is consistently more than 140 over the next two weeks, please give [place] a call and we will try changing your medication. [phone].   If you need a refill on your cardiac medications before your next appointment, please call your pharmacy.

## 2016-10-16 NOTE — Progress Notes (Signed)
Cardiology Office Note    Date:  10/17/2016   ID:  Thomas [name], DOB 02-27-1926, MRN [id number]  PCP:  Garret Reddish, MD  Cardiologist:  Dr. Percival Spanish  Chief Complaint  Patient presents with  . Follow-up    seen for Dr. Renato Gails month f/u    History of Present Illness:  Thomas [name] is a 83 y.o. male with PMH of HTN, HLD, chronic L pleural effusion, carotid artery disease and PAF on eliquis. He also have a history of cardiomyopathy with echocardiogram showing EF 40-45% with mild MR. He is not on beta blocker due to history of bradycardia. He is allergic to ACE inhibitor with Hives. Large left pleural effusion dating back to June 2017. He did undergo left thoracentesis in July 2017 with removal of 1.5 L of hazy yellow fluid. However this has recurred. He was referred to pulmonology service and was seen by Dr. Melvyn Novas on 03/08/2016, conservative therapy was recommended as patient has been largely asymptomatic.  Last office visit was on 09/20/2016, valsartan 40 mg was added to his medical regimen. He was seen by our clinical pharmacists in hypertension clinic on 10/04/2016, his blood pressure has improved, however still above goal. Unfortunately he did not bring a blood pressure diary during that visit. He was instructed to follow continue the previous medications and a follow-up today along with his blood pressure diary and home blood pressure cuffs to determine accuracy.  Patient presents today accompanied by his wife and son for cardiology visit. He has been doing well without any shortness of breath or chest discomfort. He has brought his home blood pressure cuff which read 140/88 in the office today. I will increase his amlodipine to 5 mg daily. He will check his blood pressure on daily basis and let us know if his blood pressure still above [id number] systolic in the next 2 weeks. We can potentially uptitrate amlodipine further prior to his next visit. Furthermore, on review of his previous chest  x-ray, he has moderately large left pleural effusion on chest x-ray in December 2017, however chest x-ray obtained in the emergency room a month ago on 09/19/2016 showed essentially whiteout on the left side. This is consistent with his physical exam, as I did not appreciate significant air movement on the left side at all. His right side is clear. Otherwise he does not have any obvious sign of heart failure, there is no lower extremity edema or crackle on the right side. I am not clear why he is having recurrent left pleural effusion. He has a follow-up with Dr. Melvyn Novas later this month, I have asked the family to arrange an earlier follow-up. Dr. Melvyn Novas and the family previously wished to pursue conservative management, I'm not sure if recent chest x-ray would make them pushed for more invasive treatment plan or continue medical therapy in light of his age. Fortunately, again patient is largely asymptomatic despite having significant pleural effusion for the past month. At this point, I am not sure how much of his left side of the lung has collapsed. The only reason we did not pursue any urgent treatment plan is because the patient is asymptomatic and this chest x-ray a month ago was obtained in the emergency room.   Past Medical History:  Diagnosis Date  . Adenomatous colon polyp 2009  . Allergy   . Carotid artery occlusion   . Diverticulosis of colon (without mention of hemorrhage)   . ED (erectile dysfunction)   . Esophageal stricture   .  Esophagitis   . GERD (gastroesophageal reflux disease)   . Hiatal hernia   . Hypertension   . Prostate cancer ([place])   . Unspecified gastritis and gastroduodenitis without mention of hemorrhage     Past Surgical History:  Procedure Laterality Date  . ENDARTERECTOMY Left 03/05/2013   Procedure: ENDARTERECTOMY CAROTID;  Surgeon: Conrad Three Lakes, MD;  Location: [facility];  Service: Vascular;  Laterality: Left;  . ESOPHAGOGASTRODUODENOSCOPY (EGD) WITH ESOPHAGEAL DILATION      . PROSTATE SURGERY     Laser surgery    Current Medications: Outpatient Medications Prior to Visit  Medication Sig Dispense Refill  . benzonatate (TESSALON) 100 MG capsule Take 1 capsule (100 mg total) by mouth 2 (two) times daily as needed for cough. 20 capsule 0  . cloNIDine (CATAPRES) 0.1 MG tablet TAKE ONE TABLET BY MOUTH THREE TIMES DAILY 90 tablet 4  . ELIQUIS 2.5 MG TABS tablet Take 2.5 mg by mouth 2 (two) times daily.  1  . fluticasone (FLONASE) 50 MCG/ACT nasal spray   0  . omeprazole (PRILOSEC) 40 MG capsule TAKE ONE CAPSULE BY MOUTH ONCE DAILY 60 capsule 7  . oxybutynin (DITROPAN) 5 MG tablet 1 tablet daily when necessary 100 tablet 3  . potassium chloride SA (K-DUR,KLOR-CON) 20 MEQ tablet Take 20 mEq by mouth 2 (two) times daily.    . rosuvastatin (CRESTOR) 10 MG tablet Take 1 tablet (10 mg total) by mouth daily. 92 tablet 3  . valsartan (DIOVAN) 40 MG tablet Take 1 tablet (40 mg total) by mouth daily. 30 tablet 0  . amLODipine (NORVASC) 2.5 MG tablet TAKE ONE TABLET BY MOUTH ONCE DAILY 90 tablet 3  . amLODipine (NORVASC) 2.5 MG tablet Take 2 tablets (5 mg total) by mouth daily. 90 tablet 0   No facility-administered medications prior to visit.      Allergies:   Ace inhibitors and Penicillins   Social History   Social History  . Marital status: Married    Spouse name: N/A  . Number of children: N/A  . Years of education: N/A   Social History Main Topics  . Smoking status: Never Smoker  . Smokeless tobacco: Never Used  . Alcohol use 0.0 oz/week     Comment: daily--one alcohol drink a day  . Drug use: No  . Sexual activity: Not Asked   Other Topics Concern  . None   Social History Narrative   Married 1948. 6 children. 9 grandkids. 2 greatgrandkids.  (One son was killed in the [place])      [place]. [employer]- honorable discharge.       Hobbies: golfing about twice a month.      Family History:  The patient's family history  includes Breast cancer in his sister; Cancer in his mother; Heart disease in his father; Hyperlipidemia in his daughter; Hypertension in his daughter and son.   ROS:   Please see the history of present illness.    ROS All other systems reviewed and are negative.   PHYSICAL EXAM:   VS:  BP (!) 138/92   Pulse 68   Ht 5\' 11"  (1.803 m)   Wt 174 lb 12.8 oz (79.3 kg)   BMI 24.38 kg/m    GEN: Well nourished, well developed, in no acute distress  HEENT: normal  Neck: no JVD, carotid bruits, or masses Cardiac: RRR; no murmurs, rubs, or gallops,no edema  Respiratory:  Markedly diminished breath sound on the left side GI: soft, nontender,  nondistended, + BS MS: no deformity or atrophy  Skin: warm and dry, no rash Neuro:  Alert and Oriented x 3, Strength and sensation are intact Psych: euthymic mood, full affect  Wt Readings from Last 3 Encounters:  10/16/16 174 lb 12.8 oz (79.3 kg)  09/20/16 175 lb 9.6 oz (79.7 kg)  09/18/16 175 lb (79.4 kg)      Studies/Labs Reviewed:   EKG:  EKG is not ordered today.    Recent Labs: 02/16/2016: Pro B Natriuretic peptide (BNP) 69.0 07/18/2016: TSH 2.45 08/09/2016: ALT 10 09/19/2016: Hemoglobin 11.7; Platelets 263 09/27/2016: BUN 15; Creat 1.35; Potassium 3.3; Sodium 137   Lipid Panel    Component Value Date/Time   CHOL 199 08/09/2016 1202   TRIG 90 08/09/2016 1202   HDL 37 (L) 08/09/2016 1202   CHOLHDL 5.4 (H) 08/09/2016 1202   VLDL 18 08/09/2016 1202   LDLCALC 144 (H) 08/09/2016 1202   LDLDIRECT 143 (H) 08/09/2016 1202    Additional studies/ records that were reviewed today include:   CT of chest 02/10/2016 IMPRESSION: Moderate to large left pleural effusion with left pleural enhancement - malignant effusion is not excluded. Associated moderate -severe left mid and lower lung atelectasis.  Cardiomegaly and coronary artery disease.  Aortic atherosclerosis.     Thoracentesis 02/21/2016 IMPRESSION: Successful ultrasound guided  diagnostic and therapeutic left thoracentesis yielding 1.5 liters of pleural fluid.     Echo 08/10/2016 LV EF: 40% -   45%  - Left ventricle: The cavity size was normal. There was mild   concentric hypertrophy. Systolic function was mildly to   moderately reduced. The estimated ejection fraction was in the   range of 40% to 45%. - Aortic valve: There was trivial regurgitation. - Mitral valve: Calcified annulus. Mildly thickened leaflets .   There was mild regurgitation directed centrally. - Left atrium: The atrium was moderately dilated. - Atrial septum: No defect or patent foramen ovale was identified. - Pulmonary arteries: Systolic pressure was mildly increased. PA   peak pressure: 34 mm Hg (S). - Pericardium, extracardiac: There was a left pleural effusion.   Possible masses attached to the pleuro-pericardial surface.  Impressions:  - Recommend evaluation of left pleural effusion with CT chest.    CXR 09/19/2016 FINDINGS: Upper normal size of cardiac silhouette.  Mediastinal contours normal.  Slight pulmonary vascular congestion.  Atherosclerotic calcification aorta.  Large LEFT pleural effusion significantly increased since previous exam with subtotal atelectasis of LEFT lung.  RIGHT lung clear with central peribronchial thickening noted.  No pneumothorax or acute osseous findings.  IMPRESSION: Increase in large LEFT pleural effusion and subtotal atelectasis of LEFT lung since prior study.   ASSESSMENT:    1. Recurrent left pleural effusion   2. Essential hypertension   3. Hyperlipidemia, unspecified hyperlipidemia type   4. PAF (paroxysmal atrial fibrillation) (HCC)      PLAN:  In order of problems listed above:  1. Recurrent left pleural effusion: Had previous thoracentesis, however left pleural effusion has recurred. Followed by Dr. Melvyn Novas. Presented to the ED a month ago, chest x-ray at that time showed essentially whiteout on the left  side. However patient was released from the hospital as he was asymptomatic. I think he need to be followed up Dr. Melvyn Novas closely. I'm not sure how much of his left lung has collapsed by this point. I'm quite surprised he is asymptomatic. Dr. Melvyn Novas had a long conversation with patient last year and they opted for conservative therapy. However in light  of recent chest x-ray finding, I will defer to Dr. Melvyn Novas for further workup.  2. PAF on eliquis, he is quite stable from cardiology perspective. Continue current therapy.  3. Hypertension: Blood pressure still not at goal, I added amlodipine 5 mg to his medical regimen.  4. Hyperlipidemia: Continue Crestor.    Medication Adjustments/Labs and Tests Ordered: Current medicines are reviewed at length with the patient today.  Concerns regarding medicines are outlined above.  Medication changes, Labs and Tests ordered today are listed in the Patient Instructions below. Patient Instructions  Medication Instructions: Increase Amlodipine to 5 mg daily.   Follow-Up: Your physician recommends that you schedule a follow-up appointment in: 3 months with Dr. Percival Spanish.   Any Other Special Instructions will be listed below:  Your physician has requested that you regularly monitor and record your blood pressure readings at home. Please use the same machine at the same time of day to check your readings and record them to bring to your follow-up visit. If your systolic blood pressure (top number) is consistently more than 140 over the next two weeks, please give [place] a call and we will try changing your medication. [phone].   If you need a refill on your cardiac medications before your next appointment, please call your pharmacy.     Hilbert Corrigan, [place]  10/17/2016 12:11 AM    [address], [place], [place]  [id number] Phone: [phone]; Fax: [phone]

## 2016-10-17 ENCOUNTER — Ambulatory Visit (INDEPENDENT_AMBULATORY_CARE_PROVIDER_SITE_OTHER)
Admission: RE | Admit: 2016-10-17 | Discharge: 2016-10-17 | Disposition: A | Discharge status: Home / Self Care | Payer: Medicare Other | Source: Ambulatory Visit | Attending: Pulmonary Disease | Admitting: Pulmonary Disease

## 2016-10-17 ENCOUNTER — Encounter: Payer: Self-pay | Admitting: Pulmonary Disease

## 2016-10-17 ENCOUNTER — Encounter: Payer: Self-pay | Admitting: Physician Assistant

## 2016-10-17 ENCOUNTER — Ambulatory Visit (INDEPENDENT_AMBULATORY_CARE_PROVIDER_SITE_OTHER): Payer: Medicare Other | Admitting: Pulmonary Disease

## 2016-10-17 DIAGNOSIS — J90 Pleural effusion, not elsewhere classified: Secondary | ICD-10-CM

## 2016-10-17 DIAGNOSIS — I482 Chronic atrial fibrillation: Secondary | ICD-10-CM

## 2016-10-17 DIAGNOSIS — I4821 Permanent atrial fibrillation: Secondary | ICD-10-CM

## 2016-10-17 NOTE — Patient Instructions (Signed)
CXR today Procedure planned for Friday 8am [place] -same day surgery STOP taking ELIQUIS (blood thinner) today

## 2016-10-17 NOTE — Addendum Note (Signed)
Addended by: Virl Cagey on: 10/17/2016 09:55 AM   Modules accepted: Orders

## 2016-10-17 NOTE — Progress Notes (Signed)
   Subjective:    Patient ID: Thomas [name], male    DOB: 20-Apr-1926, 83 y.o.   MRN: [id number]  HPI  29 yobm never smoker  Was seen in pulmonary clinic 02/2016  h/o serving in [employer] including [place] but denies knowledge of asbestos exp. Worked in Research scientist (medical) Complaint  Patient presents with  . Fluid Build In Lungs    Was seen by the cardiologist yesterday and was told to come here.    Accompanied by his son He is unclear on his understanding of the effusion that was tapped in 02/2016 He was asked by the cardiologist to come back and see [place] because of chest x-ray in 09/2016 showing reaccumulation of increased effusion. Surprisingly he is asymptomatic and denies cough or shortness of breath No pedal edema or orthopnea   Significant tests/ events  - L tcentesis  02/21/2016 : 1.5 liters of hazy, yellow fluid >  Exudate/ wbc 1655 L >>P with cyt atypical worrisome for adenoca   Past Medical History:  Diagnosis Date  . Adenomatous colon polyp 2009  . Allergy   . Carotid artery occlusion   . Diverticulosis of colon (without mention of hemorrhage)   . ED (erectile dysfunction)   . Esophageal stricture   . Esophagitis   . GERD (gastroesophageal reflux disease)   . Hiatal hernia   . Hypertension   . Prostate cancer ([place])   . Unspecified gastritis and gastroduodenitis without mention of hemorrhage     Review of Systems neg for any significant sore throat, dysphagia, itching, sneezing, nasal congestion or excess/ purulent secretions, fever, chills, sweats, unintended wt loss, pleuritic or exertional cp, hempoptysis, orthopnea pnd or change in chronic leg swelling. Also denies presyncope, palpitations, heartburn, abdominal pain, nausea, vomiting, diarrhea or change in bowel or urinary habits, dysuria,hematuria, rash, arthralgias, visual complaints, headache, numbness weakness or ataxia.     Objective:   Physical Exam   Gen. Pleasant, well-nourished, in no  distress ENT - no lesions, no post nasal drip Neck: No JVD, no thyromegaly, no carotid bruits Lungs: no use of accessory muscles, no dullness to percussion, decreased BS left without rales or rhonchi  Cardiovascular: Rhythm regular, heart sounds  normal, no murmurs or gallops, no peripheral edema Musculoskeletal: No deformities, no cyanosis or clubbing         Assessment & Plan:

## 2016-10-17 NOTE — Assessment & Plan Note (Signed)
CXR today Procedure planned for Friday 8am [place] -same day surgery STOP taking ELIQUIS (blood thinner) today   Unfortunately VQ bleeding, likely malignant effusion and will need Pleurx catheter

## 2016-10-17 NOTE — Assessment & Plan Note (Signed)
Stop taking eliquis

## 2016-10-18 ENCOUNTER — Telehealth: Payer: Self-pay | Admitting: Family Medicine

## 2016-10-18 NOTE — Telephone Encounter (Signed)
"

## 2016-10-18 NOTE — Telephone Encounter (Signed)
Daughter would like to see about getting home health nurse assistant for this pt and his wife, who is also a pt of Dr Yong Channel.

## 2016-10-18 NOTE — Progress Notes (Signed)
Spoke with the pt's spouse and notified of results per RA

## 2016-10-19 NOTE — Telephone Encounter (Signed)
Spoke with daughter Ivin Booty who verbalized understanding that she needs to check with insurance first to see what they will cover then, if they cover she will make an appointment for her parents to come in and see Dr. Yong Channel.

## 2016-10-20 ENCOUNTER — Ambulatory Visit (HOSPITAL_COMMUNITY)
Admission: RE | Admit: 2016-10-20 | Discharge: 2016-10-20 | Disposition: A | Discharge status: Home / Self Care | Payer: Medicare Other | Source: Ambulatory Visit | Attending: Pulmonary Disease | Admitting: Pulmonary Disease

## 2016-10-20 DIAGNOSIS — J90 Pleural effusion, not elsewhere classified: Secondary | ICD-10-CM | POA: Diagnosis not present

## 2016-10-20 DIAGNOSIS — J910 Malignant pleural effusion: Secondary | ICD-10-CM | POA: Diagnosis not present

## 2016-10-20 DIAGNOSIS — R918 Other nonspecific abnormal finding of lung field: Secondary | ICD-10-CM | POA: Insufficient documentation

## 2016-10-20 DIAGNOSIS — R0602 Shortness of breath: Secondary | ICD-10-CM | POA: Insufficient documentation

## 2016-10-20 DIAGNOSIS — Z9889 Other specified postprocedural states: Secondary | ICD-10-CM

## 2016-10-20 LAB — LACTATE DEHYDROGENASE, PLEURAL OR PERITONEAL FLUID: LD, Fluid: 264 U/L — ABNORMAL HIGH (ref 3–23)

## 2016-10-20 LAB — BODY FLUID CELL COUNT WITH DIFFERENTIAL
Lymphs, Fluid: 80 %
Monocyte-Macrophage-Serous Fluid: 14 % — ABNORMAL LOW (ref 50–90)
NEUTROPHIL FLUID: 6 % (ref 0–25)
WBC FLUID: 381 uL (ref 0–1000)

## 2016-10-20 NOTE — Procedures (Signed)
Thoracentesis Procedure Note  Pre-operative Diagnosis: large left effusion  Post-operative Diagnosis: same  Indications: Shortness of breath due to effusion  Procedure Details  Consent: Informed consent was obtained. Risks of the procedure were discussed including: infection, bleeding, pain, pneumothorax. Ultrasound used to mark point for thoracentesis. Pleural nodules noted  Under sterile conditions the patient was positioned. Betadine solution and sterile drapes were utilized.  2% buffered lidocaine was used to anesthetize the left 7th rib space. Fluid was obtained without any difficulties and minimal blood loss.  A dressing was applied to the wound and wound care instructions were provided.   Findings 1300 ml of dark amber yellow pleural fluid was obtained. A sample was sent to Pathology for cytology, and cell counts, as well as for infection analysis.  Complications:  None; patient tolerated the procedure well.          Condition: stable  Plan A follow up chest x-ray was ordered. Bed Rest for 1 hours. Tylenol 650 mg. for pain.  Attending Attestation: I performed the procedure.

## 2016-10-20 NOTE — Progress Notes (Signed)
Pt arrived for thoracentesis. Pre procedure vitals HR 82 RR 22 BP 180/77 SpO2 97 Pt tolerated procedure well. Samples sent to lab. Post procedure vital WNL. CXR results called to MD. No pneumothorax noted. Pt discharged home with follow up in office. Discharge vital HR 74 SpO2 96 RR 20 BP 160/95

## 2016-10-21 LAB — PROTEIN, BODY FLUID (OTHER): Total Protein, Body Fluid Other: 3.6 g/dL

## 2016-10-23 LAB — BODY FLUID CULTURE: CULTURE: NO GROWTH

## 2016-10-26 ENCOUNTER — Telehealth: Payer: Self-pay | Admitting: Pulmonary Disease

## 2016-10-26 NOTE — Telephone Encounter (Signed)
ATC, NA and no VM  

## 2016-10-26 NOTE — Telephone Encounter (Signed)
Son is returning phone call.

## 2016-10-26 NOTE — Telephone Encounter (Signed)
lmtcb

## 2016-10-27 ENCOUNTER — Other Ambulatory Visit: Payer: Self-pay

## 2016-10-27 ENCOUNTER — Telehealth: Payer: Self-pay | Admitting: Pulmonary Disease

## 2016-10-27 ENCOUNTER — Ambulatory Visit (INDEPENDENT_AMBULATORY_CARE_PROVIDER_SITE_OTHER)
Admission: RE | Admit: 2016-10-27 | Discharge: 2016-10-27 | Disposition: A | Discharge status: Home / Self Care | Payer: Medicare Other | Source: Ambulatory Visit | Attending: Pulmonary Disease | Admitting: Pulmonary Disease

## 2016-10-27 DIAGNOSIS — J90 Pleural effusion, not elsewhere classified: Secondary | ICD-10-CM

## 2016-10-27 NOTE — Telephone Encounter (Signed)
We have scheduled the ct for today at 4:00pm at [place] ct pt and son are aware

## 2016-10-27 NOTE — Telephone Encounter (Signed)
Thomas [name] sent a message and stated that she spoke with pt and informed him what MW stated in the phone note.  She did let the pt go.  Nothing futher is needed.

## 2016-10-27 NOTE — Telephone Encounter (Signed)
This shows the same effusion and unless the pt is having resting sob ok to put off further w/u for weekend but if gets sob at rest needs to go back to er

## 2016-10-27 NOTE — Telephone Encounter (Signed)
Pt is currently at [facility] CT. He has a CT done of his chest. Erline Levine called to let us know that the report is up and she doesn't want to let the pt go until someone looks at the report.  IMPRESSION: 1. Large pleural fluid collection in the LEFT hemithorax with thickened pleural surface suggest empyema or malignant effusion. 2. Minimal aeration of the LEFT upper lobe and  LEFT lower lobe. 3. Multiple small mediastinal lymph nodes.  MW - please advise as RA is unavailable. Thanks.

## 2016-10-27 NOTE — Telephone Encounter (Signed)
Called and [facility] for [place] with [place] CT.

## 2016-10-30 ENCOUNTER — Telehealth: Payer: Self-pay | Admitting: Pulmonary Disease

## 2016-10-30 ENCOUNTER — Encounter: Payer: Self-pay | Admitting: Pulmonary Disease

## 2016-10-30 ENCOUNTER — Ambulatory Visit (INDEPENDENT_AMBULATORY_CARE_PROVIDER_SITE_OTHER): Payer: Medicare Other | Admitting: Pulmonary Disease

## 2016-10-30 DIAGNOSIS — J9811 Atelectasis: Secondary | ICD-10-CM

## 2016-10-30 DIAGNOSIS — J90 Pleural effusion, not elsewhere classified: Secondary | ICD-10-CM

## 2016-10-30 NOTE — Patient Instructions (Addendum)
We discussed CT scan findings and pleural biopsy procedure  I will discuss with the radiologist and schedule if possible Stop taking blood thinners 3 days before the procedure

## 2016-10-30 NOTE — Telephone Encounter (Signed)
Per Dr. Elsworth Soho, he spoke with the radiologist and decided to do a PET scan. If any area lights up during the PET scan, he will then decide to do a biopsy on that area.

## 2016-10-30 NOTE — Assessment & Plan Note (Signed)
Trapped lung-favor malignant process rather than inflammatory

## 2016-10-30 NOTE — Assessment & Plan Note (Addendum)
We discussed CT scan findings and pleural biopsy procedure  I will discuss with the radiologist and schedule if possible Stop taking blood thinners 3 days before the procedure  This is almost certainly malignancy-discussed that we may not have many therapeutic interventions to offer other than Pleurx catheter. He is not willing to have this at the current time  May need PET scan - I discussed with radiologist and they would need a PET scan before deciding which area of the parotid target

## 2016-10-30 NOTE — Telephone Encounter (Signed)
Spoke with Ivin Booty (pt's daughter) about biopsy and PET scan. Ivin Booty verbalized understanding. Will go ahead and place order for PET scan. Nothing else was needed at time of call.

## 2016-10-30 NOTE — Progress Notes (Signed)
   Subjective:    Patient ID: Thomas [name], male    DOB: 08-Aug-1926, 83 y.o.   MRN: [id number]  HPI  17 yobm never smoker Was seen in pulmonary clinic 02/2016  h/o serving in [employer] including [place] but denies knowledge of asbestos exp. Worked in sonar room    Accompanied by his son And daughter Thomas [name] He is much improved after repeat thoracentesis performed, his dyspnea is improved. He did not have any problems after the procedure  He again denies cough, orthopnea or hemoptysis His weight has been maintained  We reviewed pleural fluid results and CT scan results and images   Significant tests/ events  - L tcentesis 02/21/2016 : 1.5 liters of hazy, yellow fluid >Exudate/ wbc 1655 L >>P with cyt atypical worrisome for adenoca  10/20/16 1.3 L thora  10/27/16 CT chest showed large left effusion with thickened pleura in atelectasis with left lung   Review of Systems neg for any significant sore throat, dysphagia, itching, sneezing, nasal congestion or excess/ purulent secretions, fever, chills, sweats, unintended wt loss, pleuritic or exertional cp, hempoptysis, orthopnea pnd or change in chronic leg swelling. Also denies presyncope, palpitations, heartburn, abdominal pain, nausea, vomiting, diarrhea or change in bowel or urinary habits, dysuria,hematuria, rash, arthralgias, visual complaints, headache, numbness weakness or ataxia.     Objective:   Physical Exam  Gen. Pleasant, well-nourished, in no distress ENT - no lesions, no post nasal drip Neck: No JVD, no thyromegaly, no carotid bruits Lungs: no use of accessory muscles, no dullness to percussion, decreased left  without rales or rhonchi  Cardiovascular: Rhythm regular, heart sounds  normal, no murmurs or gallops, no peripheral edema Musculoskeletal: No deformities, no cyanosis or clubbing        Assessment & Plan:

## 2016-11-06 ENCOUNTER — Ambulatory Visit: Payer: Medicare Other | Admitting: Family

## 2016-11-06 ENCOUNTER — Encounter (HOSPITAL_COMMUNITY): Payer: Medicare Other

## 2016-11-13 ENCOUNTER — Telehealth: Payer: Self-pay | Admitting: Pulmonary Disease

## 2016-11-13 ENCOUNTER — Encounter (HOSPITAL_COMMUNITY)
Admission: RE | Admit: 2016-11-13 | Discharge: 2016-11-13 | Disposition: A | Discharge status: Home / Self Care | Payer: Medicare Other | Source: Ambulatory Visit | Attending: Pulmonary Disease | Admitting: Pulmonary Disease

## 2016-11-13 DIAGNOSIS — R590 Localized enlarged lymph nodes: Secondary | ICD-10-CM | POA: Diagnosis not present

## 2016-11-13 DIAGNOSIS — J9819 Other pulmonary collapse: Secondary | ICD-10-CM | POA: Insufficient documentation

## 2016-11-13 DIAGNOSIS — J90 Pleural effusion, not elsewhere classified: Secondary | ICD-10-CM | POA: Diagnosis not present

## 2016-11-13 DIAGNOSIS — J9811 Atelectasis: Secondary | ICD-10-CM | POA: Insufficient documentation

## 2016-11-13 LAB — GLUCOSE, CAPILLARY: Glucose-Capillary: 108 mg/dL — ABNORMAL HIGH (ref 65–99)

## 2016-11-13 MED ORDER — FLUDEOXYGLUCOSE F - 18 (FDG) INJECTION
8.5300 | Freq: Once | INTRAVENOUS | Status: AC | PRN
  Administered 2016-11-13: 8.53 via INTRAVENOUS

## 2016-11-13 NOTE — Telephone Encounter (Signed)
lmtcb X1 for pt  

## 2016-11-13 NOTE — Progress Notes (Addendum)
Subjective:   Thomas [name] is a 83 y.o. male who presents for Medicare Annual/Subsequent preventive examination.  The Patient was informed that the wellness visit is to identify future health risk and educate and initiate measures that can reduce risk for increased disease through the lifespan.    NO ROS; Medicare Wellness Visit  Describes health as good, fair or great? Good Collapsed lung - waiting on the MD to call  Denies pain   Preventive Screening -Counseling & Management  Recent  PET scan for lung mass; waiting for more information per the family   Smoking history - never smoked     Smokeless tobacco  Second Hand Smoke status; No Smokers in the home ETOH - daily - moderate;  Ave 2 drinks per day;  not every day  Eats prior to a drinking Denies dizziness of falls  dtr states he has a cane in the home.  Very vague regarding ETOH  Encouraged to limit drinking  Has supervision in the home at all times   Medication adherence or issues? None., dtr prepares and he is monitored to be sure he is taking meds correctly  RISK FACTORS Diet -  Eggs and bowl of cereal and glass of juice Wife is an excellent cook Doesn't cook as much now  Married 59 years  Children prepare meals   Regular exercise  Walks x 30 minutes  [facility];  Swims x 30 minutes a couple of times a week  Plays golf 2 times per month (dtr stated on leaving that he does not do any of these things but did not state this to the patient)      Cardiac Risk Factors:  Advanced aged > 48 in men; Hyperlipidemia - HDL 37; Trig 90  Diabetes - A1c 6.7  Family History - mother cancer; father had HD; sister breast cancer Obesity neg BMI 25   Fall risk no falls  Given education on "Fall Prevention in the Home" for more safety tips the patient can apply as appropriate.  Bathrooms updated  Long term goal is to "age in place"  Mobility of Functional changes this year? 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 Anxious, depressed, irritable, sad or blue? No  Denies feeling depressed or hopeless; voices pleasure in daily life How many social activities have you been engaged in within the last 2 weeks? No No overt depression   Activities of Daily Living - See functional screen   Family is in process of completing an  AD   Patient Care Team: Marin Olp, MD as PCP - General (Family Medicine) Fay Records, MD as Attending Physician (Cardiology)   Immunization History  Administered Date(s) Administered  . Influenza Split 04/27/2011, 04/29/2012  . Influenza Whole 08/14/2004, 06/05/2007, 05/15/2008, 05/26/2009, 04/20/2010  . Influenza, High Dose Seasonal PF 05/29/2013, 06/02/2014, 05/18/2015  . Influenza-Unspecified 04/29/2016  . Pneumococcal Conjugate-13 05/18/2015  . Pneumococcal Polysaccharide-23 08/14/2002, 03/26/2008  . Td  08/14/1998, 04/09/2009   Required Immunizations needed today  Screening test up to date or reviewed for plan of completion There are no preventive care reminders to display for this patient.   Cardiac Risk Factors include: advanced age (>22men, >84 women);dyslipidemia;hypertension;male gender     Objective:    Vitals: BP 128/80   Pulse 78   Ht 5\' 11"  (1.803 m)   Wt 173 lb 5 oz (78.6 kg)   SpO2 93%   BMI 24.17 kg/m   Body mass index is 24.17 kg/m.  Tobacco History  Smoking Status  . Never Smoker  Smokeless Tobacco  . Never Used     Counseling given: Yes   Past Medical History:    Diagnosis Date  . Adenomatous colon polyp 2009  . Allergy   . Carotid artery occlusion   . Diverticulosis of colon (without mention of hemorrhage)   . ED (erectile dysfunction)   . Esophageal stricture   . Esophagitis   . GERD (gastroesophageal reflux disease)   . Hiatal hernia   . Hypertension   . Prostate cancer ([place])   . Unspecified gastritis and gastroduodenitis without mention of hemorrhage    Past Surgical History:  Procedure Laterality Date  . ENDARTERECTOMY Left 03/05/2013   Procedure: ENDARTERECTOMY CAROTID;  Surgeon: Conrad Rockvale, MD;  Location: [facility];  Service: Vascular;  Laterality: Left;  . ESOPHAGOGASTRODUODENOSCOPY (EGD) WITH ESOPHAGEAL DILATION    . PROSTATE SURGERY     Laser surgery   Family History  Problem Relation Age of Onset  . Heart disease Father     unclear specifics  . Cancer Mother   . Breast cancer Sister   . Hyperlipidemia Daughter   . Hypertension Daughter   . Hypertension Son    History  Sexual Activity  . Sexual activity: Not on file    Outpatient Encounter Prescriptions as of 11/14/2016  Medication Sig  . amLODipine (NORVASC) 5 MG tablet Take 1 tablet (5 mg total) by mouth daily.  . benzonatate (TESSALON) 100 MG capsule Take 1 capsule (100 mg total) by mouth 2 (two) times daily as needed for cough.  . cloNIDine (CATAPRES) 0.1 MG tablet TAKE ONE TABLET BY MOUTH THREE TIMES DAILY  . ELIQUIS 2.5 MG TABS tablet Take 2.5 mg by mouth 2 (two) times daily.  . fluticasone (FLONASE) 50 MCG/ACT nasal spray   . omeprazole (PRILOSEC) 40 MG capsule TAKE ONE CAPSULE BY MOUTH ONCE DAILY  . oxybutynin (DITROPAN) 5 MG tablet 1 tablet daily when necessary  . potassium chloride SA (K-DUR,KLOR-CON) 20 MEQ tablet Take 20 mEq by mouth 2 (two) times daily.  . rosuvastatin (CRESTOR) 10 MG tablet Take 1 tablet (10 mg total) by mouth daily.  . valsartan (DIOVAN) 40 MG tablet Take 1 tablet (40 mg total) by mouth daily.   No facility-administered encounter  medications on file as of 11/14/2016.     Activities of Daily Living In your present state of health, do you have any difficulty performing the following activities: 11/14/2016  Hearing? Y  Vision? N  Difficulty concentrating or making decisions? Y  Walking or climbing stairs? N  Dressing or bathing? N  Doing errands, shopping? N  Preparing Food and eating ? Y  Using the Toilet? N  In the past six months, have you accidently leaked urine? N  Do you have problems with loss of bowel control? N  Managing your Medications? N  Managing your Finances? N  Housekeeping or managing your Housekeeping? N  Some recent  data might be hidden    Patient Care Team: Marin Olp, MD as PCP - General (Family Medicine) Fay Records, MD as Attending Physician (Cardiology)   Assessment:     Exercise Activities and Dietary recommendations Current Exercise Habits: Home exercise routine, Time (Minutes): 30, Frequency (Times/Week): 4, Weekly Exercise (Minutes/Week): 120, Intensity: Moderate  Goals    . patient          Stay calm       Fall Risk Fall Risk  11/14/2016 05/29/2016 05/18/2015 11/03/2013  Falls in the past year? No No No No   Depression Screen PHQ 2/9 Scores 11/14/2016 05/29/2016 05/18/2015 11/03/2013  PHQ - 2 Score 0 0 0 0    Cognitive Function MMSE - Mini Mental State Exam 11/14/2016  Orientation to time 3  Orientation to Place 5  Registration 3  Attention/ Calculation 1  Recall 0  Language- name 2 objects 2  Language- repeat 1  Language- follow 3 step command 3  Language- read & follow direction 1    Waived the written sentence as he declined Did not have glasses to draw a picture 19/28; with memory loss features but still able to function with most ADLs. Meds and cooking overseen by children. Dtr Left a message that Thomas [name] does not walk, swim or do any of the exercise.  Could not state the year;   5 children all supportive and assisting with the care of he and his wife    Memory issues noted but dtr does not seem overwhelmed. The patient is easily directed; Family is currently completing task they can no longer manage. No c/o from the dtr regarding memory.     Immunization History  Administered Date(s) Administered  . Influenza Split 04/27/2011, 04/29/2012  . Influenza Whole 08/14/2004, 06/05/2007, 05/15/2008, 05/26/2009, 04/20/2010  . Influenza, High Dose Seasonal PF 05/29/2013, 06/02/2014, 05/18/2015  . Influenza-Unspecified 04/29/2016  . Pneumococcal Conjugate-13 05/18/2015  . Pneumococcal Polysaccharide-23 08/14/2002, 03/26/2008  . Td 08/14/1998, 04/09/2009   Screening Tests Health Maintenance  Topic Date Due  . INFLUENZA VACCINE  03/14/2017  . TETANUS/TDAP  04/10/2019  . PNA vac Low Risk Adult  Completed      Plan:      PCP Notes  Health Maintenance No preventive health  Abnormal Screens  [facility]. Katy Fitch for eyes; to schedule apt  MMSE 19/28; declined writing a sentence or drawing a picture. Memory issues advancing; no hx of stroke per the dtr.   Referrals none  Patient concerns; none Very social; veteran; does not get agitated at all.   Nurse Concerns; no issues presented by the family today. The children are providing car in the home. Thomas [name] drives to store that is near by but no where else. They are under close monitoring with the son living in the home.   Next PCP apt 10/16      During the course of the visit the patient was educated and counseled about the following appropriate screening and preventive services:   Vaccines to include Pneumoccal, Influenza, Hepatitis B, Td, Zostavax, HCV  Electrocardiogram  Cardiovascular Disease  Colorectal cancer screening aged out  Diabetes screening / A1c 6.7 but did not discuss   Prostate Cancer Screening - deferred   Glaucoma screening  Nutrition counseling   Smoking cessation counseling never smoked    Patient Instructions (the written plan)  was given to the patient.    Wynetta Fines, RN  11/14/2016 04/06 call back to  dtr; Ms. Truman Hayward. Stated that the family has noted memory issues with Thomas [name] for some time. States for now, he is going to have a bx of possible lung cancer node, but after that, will schedule an apt for further assessment and evaluation of memory by Dr. Yong Channel.  Wynetta Fines RN

## 2016-11-14 ENCOUNTER — Ambulatory Visit (INDEPENDENT_AMBULATORY_CARE_PROVIDER_SITE_OTHER): Payer: Medicare Other

## 2016-11-14 VITALS — BP 128/80 | HR 78 | Ht 71.0 in | Wt 173.3 lb

## 2016-11-14 DIAGNOSIS — Z0000 Encounter for general adult medical examination without abnormal findings: Secondary | ICD-10-CM

## 2016-11-14 NOTE — Telephone Encounter (Signed)
Spoke with pt's son, who is requesting pt's PET results. April's call back number is [phone].  RA please advise. Thanks.

## 2016-11-14 NOTE — Telephone Encounter (Signed)
LMTCB

## 2016-11-14 NOTE — Telephone Encounter (Signed)
[phone] Shiner [name]) calling back

## 2016-11-14 NOTE — Patient Instructions (Addendum)
Thomas [name] , Thank you for taking time to come for your Medicare Wellness Visit. I appreciate your ongoing commitment to your health goals. Please review the following plan we discussed and let me know if I can assist you in the future.    (plan eye exam )   These are the goals we discussed: Goals    . patient          Stay calm        This is a list of the screening recommended for you and due dates:  Health Maintenance  Topic Date Due  . Flu Shot  03/14/2017  . Tetanus Vaccine  04/10/2019  . Pneumonia vaccines  Completed     Fall Prevention in the Home Falls can cause injuries. They can happen to people of all ages. There are many things you can do to make your home safe and to help prevent falls. What can I do on the outside of my home?  Regularly fix the edges of walkways and driveways and fix any cracks.  Remove anything that might make you trip as you walk through a door, such as a raised step or threshold.  Trim any bushes or trees on the path to your home.  Use bright outdoor lighting.  Clear any walking paths of anything that might make someone trip, such as rocks or tools.  Regularly check to see if handrails are loose or broken. Make sure that both sides of any steps have handrails.  Any raised decks and porches should have guardrails on the edges.  Have any leaves, snow, or ice cleared regularly.  Use sand or salt on walking paths during winter.  Clean up any spills in your garage right away. This includes oil or grease spills. What can I do in the bathroom?  Use night lights.  Install grab bars by the toilet and in the tub and shower. Do not use towel bars as grab bars.  Use non-skid mats or decals in the tub or shower.  If you need to sit down in the shower, use a plastic, non-slip stool.  Keep the floor dry. Clean up any water that spills on the floor as soon as it happens.  Remove soap buildup in the tub or shower regularly.  Attach bath  mats securely with double-sided non-slip rug tape.  Do not have throw rugs and other things on the floor that can make you trip. What can I do in the bedroom?  Use night lights.  Make sure that you have a light by your bed that is easy to reach.  Do not use any sheets or blankets that are too big for your bed. They should not hang down onto the floor.  Have a firm chair that has side arms. You can use this for support while you get dressed.  Do not have throw rugs and other things on the floor that can make you trip. What can I do in the kitchen?  Clean up any spills right away.  Avoid walking on wet floors.  Keep items that you use a lot in easy-to-reach places.  If you need to reach something above you, use a strong step stool that has a grab bar.  Keep electrical cords out of the way.  Do not use floor polish or wax that makes floors slippery. If you must use wax, use non-skid floor wax.  Do not have throw rugs and other things on the floor that can make you  trip. What can I do with my stairs?  Do not leave any items on the stairs.  Make sure that there are handrails on both sides of the stairs and use them. Fix handrails that are broken or loose. Make sure that handrails are as long as the stairways.  Check any carpeting to make sure that it is firmly attached to the stairs. Fix any carpet that is loose or worn.  Avoid having throw rugs at the top or bottom of the stairs. If you do have throw rugs, attach them to the floor with carpet tape.  Make sure that you have a light switch at the top of the stairs and the bottom of the stairs. If you do not have them, ask someone to add them for you. What else can I do to help prevent falls?  Wear shoes that:  Do not have high heels.  Have rubber bottoms.  Are comfortable and fit you well.  Are closed at the toe. Do not wear sandals.  If you use a stepladder:  Make sure that it is fully opened. Do not climb a closed  stepladder.  Make sure that both sides of the stepladder are locked into place.  Ask someone to hold it for you, if possible.  Clearly mark and make sure that you can see:  Any grab bars or handrails.  First and last steps.  Where the edge of each step is.  Use tools that help you move around (mobility aids) if they are needed. These include:  Canes.  Walkers.  Scooters.  Crutches.  Turn on the lights when you go into a dark area. Replace any light bulbs as soon as they burn out.  Set up your furniture so you have a clear path. Avoid moving your furniture around.  If any of your floors are uneven, fix them.  If there are any pets around you, be aware of where they are.  Review your medicines with your doctor. Some medicines can make you feel dizzy. This can increase your chance of falling. Ask your doctor what other things that you can do to help prevent falls. This information is not intended to replace advice given to you by your health care provider. Make sure you discuss any questions you have with your health care provider. Document Released: 05/27/2009 Document Revised: 01/06/2016 Document Reviewed: 09/04/2014 Elsevier Interactive Patient Education  2017 [place] Maintenance, Male A healthy lifestyle and preventive care is important for your health and wellness. Ask your health care provider about what schedule of regular examinations is right for you. What should I know about weight and diet?  Eat a Healthy Diet  Eat plenty of vegetables, fruits, whole grains, low-fat dairy products, and lean protein.  Do not eat a lot of foods high in solid fats, added sugars, or salt. Maintain a Healthy Weight  Regular exercise can help you achieve or maintain a healthy weight. You should:  Do at least 150 minutes of exercise each week. The exercise should increase your heart rate and make you sweat (moderate-intensity exercise).  Do strength-training  exercises at least twice a week. Watch Your Levels of Cholesterol and Blood Lipids  Have your blood tested for lipids and cholesterol every 5 years starting at 81 years of age. If you are at high risk for heart disease, you should start having your blood tested when you are 81 years old. You may need to have your cholesterol levels checked more often  if:  Your lipid or cholesterol levels are high.  You are older than 81 years of age.  You are at high risk for heart disease. What should I know about cancer screening? Many types of cancers can be detected early and may often be prevented. Lung Cancer  You should be screened every year for lung cancer if:  You are a current smoker who has smoked for at least 30 years.  You are a former smoker who has quit within the past 15 years.  Talk to your health care provider about your screening options, when you should start screening, and how often you should be screened. Colorectal Cancer  Routine colorectal cancer screening usually begins at 81 years of age and should be repeated every 5-10 years until you are 81 years old. You may need to be screened more often if early forms of precancerous polyps or small growths are found. Your health care provider may recommend screening at an earlier age if you have risk factors for colon cancer.  Your health care provider may recommend using home test kits to check for hidden blood in the stool.  A small camera at the end of a tube can be used to examine your colon (sigmoidoscopy or colonoscopy). This checks for the earliest forms of colorectal cancer. Prostate and Testicular Cancer  Depending on your age and overall health, your health care provider may do certain tests to screen for prostate and testicular cancer.  Talk to your health care provider about any symptoms or concerns you have about testicular or prostate cancer. Skin Cancer  Check your skin from head to toe regularly.  Tell your  health care provider about any new moles or changes in moles, especially if:  There is a change in a mole's size, shape, or color.  You have a mole that is larger than a pencil eraser.  Always use sunscreen. Apply sunscreen liberally and repeat throughout the day.  Protect yourself by wearing long sleeves, pants, a wide-brimmed hat, and sunglasses when outside. What should I know about heart disease, diabetes, and high blood pressure?  If you are 59-13 years of age, have your blood pressure checked every 3-5 years. If you are 11 years of age or older, have your blood pressure checked every year. You should have your blood pressure measured twice-once when you are at a hospital or clinic, and once when you are not at a hospital or clinic. Record the average of the two measurements. To check your blood pressure when you are not at a hospital or clinic, you can use:  An automated blood pressure machine at a pharmacy.  A home blood pressure monitor.  Talk to your health care provider about your target blood pressure.  If you are between 17-34 years old, ask your health care provider if you should take aspirin to prevent heart disease.  Have regular diabetes screenings by checking your fasting blood sugar level.  If you are at a normal weight and have a low risk for diabetes, have this test once every three years after the age of 79.  If you are overweight and have a high risk for diabetes, consider being tested at a younger age or more often.  A one-time screening for abdominal aortic aneurysm (AAA) by ultrasound is recommended for men aged 66-75 years who are current or former smokers. What should I know about preventing infection? Hepatitis B  If you have a higher risk for hepatitis B, you should be  screened for this virus. Talk with your health care provider to find out if you are at risk for hepatitis B infection. Hepatitis C  Blood testing is recommended for:  Everyone born from  [birth year] through 1963.  Anyone with known risk factors for hepatitis C. Sexually Transmitted Diseases (STDs)  You should be screened each year for STDs including gonorrhea and chlamydia if:  You are sexually active and are younger than 81 years of age.  You are older than 81 years of age and your health care provider tells you that you are at risk for this type of infection.  Your sexual activity has changed since you were last screened and you are at an increased risk for chlamydia or gonorrhea. Ask your health care provider if you are at risk.  Talk with your health care provider about whether you are at high risk of being infected with HIV. Your health care provider may recommend a prescription medicine to help prevent HIV infection. What else can I do?  Schedule regular health, dental, and eye exams.  Stay current with your vaccines (immunizations).  Do not use any tobacco products, such as cigarettes, chewing tobacco, and e-cigarettes. If you need help quitting, ask your health care provider.  Limit alcohol intake to no more than 2 drinks per day. One drink equals 12 ounces of beer, 5 ounces of wine, or 1 ounces of hard liquor.  Do not use street drugs.  Do not share needles.  Ask your health care provider for help if you need support or information about quitting drugs.  Tell your health care provider if you often feel depressed.  Tell your health care provider if you have ever been abused or do not feel safe at home. This information is not intended to replace advice given to you by your health care provider. Make sure you discuss any questions you have with your health care provider. Document Released: 01/27/2008 Document Revised: 03/29/2016 Document Reviewed: 05/04/2015 Elsevier Interactive Patient Education  2017 [employer]   Hearing Loss Hearing loss is a partial or total loss of the ability to hear. This can be temporary or permanent, and it can happen in one or  both ears. Hearing loss may be referred to as deafness. Medical care is necessary to treat hearing loss properly and to prevent the condition from getting worse. Your hearing may partially or completely come back, depending on what caused your hearing loss and how severe it is. In some cases, hearing loss is permanent. What are the causes? Common causes of hearing loss include:  Too much wax in the ear canal.  Infection of the ear canal or middle ear.  Fluid in the middle ear.  Injury to the ear or surrounding area.  An object stuck in the ear.  Prolonged exposure to loud sounds, such as music. Less common causes of hearing loss include:  Tumors in the ear.  Viral or bacterial infections, such as meningitis.  A hole in the eardrum (perforated eardrum).  Problems with the hearing nerve that sends signals between the brain and the ear.  Certain medicines. What are the signs or symptoms? Symptoms of this condition may include:  Difficulty telling the difference between sounds.  Difficulty following a conversation when there is background noise.  Lack of response to sounds in your environment. This may be most noticeable when you do not respond to startling sounds.  Needing to turn up the volume on the television, radio, etc.  Ringing in  the ears.  Dizziness.  Pain in the ears. How is this diagnosed? This condition is diagnosed based on a physical exam and a hearing test (audiometry). The audiometry test will be performed by a hearing specialist (audiologist). You may also be referred to an ear, nose, and throat (ENT) specialist (otolaryngologist). How is this treated? Treatment for recent onset of hearing loss may include:  Ear wax removal.  Being prescribed medicines to prevent infection (antibiotics).  Being prescribed medicines to reduce inflammation (corticosteroids). Follow these instructions at home:  If you were prescribed an antibiotic medicine, take it as  told by your health care provider. Do not stop taking the antibiotic even if you start to feel better.  Take over-the-counter and prescription medicines only as told by your health care provider.  Avoid loud noises.  Return to your normal activities as told by your health care provider. Ask your health care provider what activities are safe for you.  Keep all follow-up visits as told by your health care provider. This is important. Contact a health care provider if:  You feel dizzy.  You develop new symptoms.  You vomit or feel nauseous.  You have a fever. Get help right away if:  You develop sudden changes in your vision.  You have severe ear pain.  You have new or increased weakness.  You have a severe headache. This information is not intended to replace advice given to you by your health care provider. Make sure you discuss any questions you have with your health care provider. Document Released: 07/31/2005 Document Revised: 01/06/2016 Document Reviewed: 12/16/2014 Elsevier Interactive Patient Education  2017 [employer].

## 2016-11-14 NOTE — Progress Notes (Signed)
I have reviewed and agree with note, evaluation, plan.   Manuela Schwartz- I was unaware of the significant memory loss. I know the family/patient have a lot going on with current evaluations. If they would like to come by for me to evaluate memory further I certainly would be happy to see them. Would you mind checking with them?  Garret Reddish, MD

## 2016-11-14 NOTE — Telephone Encounter (Signed)
The covering of the lung [pleura] lighted up . This gives the radiologist site to biopsy

## 2016-11-15 ENCOUNTER — Other Ambulatory Visit: Payer: Self-pay

## 2016-11-15 DIAGNOSIS — J90 Pleural effusion, not elsewhere classified: Secondary | ICD-10-CM

## 2016-11-15 NOTE — Telephone Encounter (Signed)
Order has been placed.

## 2016-11-15 NOTE — Telephone Encounter (Signed)
CT guided biopsy of pleural mass - mention discussed with dr Kathlene Cote

## 2016-11-15 NOTE — Telephone Encounter (Signed)
Spoke with IR, pt is not scheduled for a biopsy. Before I place the order, you would like a pleural biopsy to be performed?

## 2016-11-15 NOTE — Telephone Encounter (Signed)
Spoke with patient's son about results. Son verbalized understanding. Will check with IR to make sure he has an appt for his lung biopsy.

## 2016-11-16 ENCOUNTER — Encounter: Payer: Self-pay | Admitting: Family

## 2016-11-17 NOTE — Progress Notes (Signed)
Noted thanks for addendum.

## 2016-11-27 ENCOUNTER — Other Ambulatory Visit: Payer: Self-pay

## 2016-11-27 ENCOUNTER — Telehealth: Payer: Self-pay

## 2016-11-27 ENCOUNTER — Ambulatory Visit (INDEPENDENT_AMBULATORY_CARE_PROVIDER_SITE_OTHER): Payer: Medicare Other | Admitting: Family

## 2016-11-27 ENCOUNTER — Ambulatory Visit (HOSPITAL_COMMUNITY)
Admission: RE | Admit: 2016-11-27 | Discharge: 2016-11-27 | Disposition: A | Discharge status: Home / Self Care | Payer: Medicare Other | Source: Ambulatory Visit | Attending: Family | Admitting: Family

## 2016-11-27 ENCOUNTER — Encounter: Payer: Self-pay | Admitting: Family

## 2016-11-27 VITALS — BP 172/102 | HR 83 | Temp 98.2°F | Resp 20 | Ht 71.0 in | Wt 173.0 lb

## 2016-11-27 DIAGNOSIS — Z9889 Other specified postprocedural states: Secondary | ICD-10-CM

## 2016-11-27 DIAGNOSIS — I6522 Occlusion and stenosis of left carotid artery: Secondary | ICD-10-CM

## 2016-11-27 DIAGNOSIS — Z48812 Encounter for surgical aftercare following surgery on the circulatory system: Secondary | ICD-10-CM | POA: Insufficient documentation

## 2016-11-27 DIAGNOSIS — R030 Elevated blood-pressure reading, without diagnosis of hypertension: Secondary | ICD-10-CM

## 2016-11-27 NOTE — Telephone Encounter (Signed)
Spoke with pt and daughter. She reports that they just left [facility] and pt's BP was elevated. He has not taken any of his medications today. BP was 172/102. Advised pt to take his medications, to abstain from alcohol (per daughter) and to recheck his BP one hour after taking medications. Pt has no s/s of elevated BP so no concern for immediate evaluation at this time. They will call back if BP remains elevated.

## 2016-11-27 NOTE — Patient Instructions (Signed)
Stroke Prevention Some medical conditions and behaviors are associated with an increased chance of having a stroke. You may prevent a stroke by making healthy choices and managing medical conditions. How can I reduce my risk of having a stroke?  Stay physically active. Get at least 30 minutes of activity on most or all days.  Do not smoke. It may also be helpful to avoid exposure to secondhand smoke.  Limit alcohol use. Moderate alcohol use is considered to be:  No more than 2 drinks per day for men.  No more than 1 drink per day for nonpregnant women.  Eat healthy foods. This involves:  Eating 5 or more servings of fruits and vegetables a day.  Making dietary changes that address high blood pressure (hypertension), high cholesterol, diabetes, or obesity.  Manage your cholesterol levels.  Making food choices that are high in fiber and low in saturated fat, trans fat, and cholesterol may control cholesterol levels.  Take any prescribed medicines to control cholesterol as directed by your health care provider.  Manage your diabetes.  Controlling your carbohydrate and sugar intake is recommended to manage diabetes.  Take any prescribed medicines to control diabetes as directed by your health care provider.  Control your hypertension.  Making food choices that are low in salt (sodium), saturated fat, trans fat, and cholesterol is recommended to manage hypertension.  Ask your health care provider if you need treatment to lower your blood pressure. Take any prescribed medicines to control hypertension as directed by your health care provider.  If you are 18-39 years of age, have your blood pressure checked every 3-5 years. If you are 40 years of age or older, have your blood pressure checked every year.  Maintain a healthy weight.  Reducing calorie intake and making food choices that are low in sodium, saturated fat, trans fat, and cholesterol are recommended to manage  weight.  Stop drug abuse.  Avoid taking birth control pills.  Talk to your health care provider about the risks of taking birth control pills if you are over 35 years old, smoke, get migraines, or have ever had a blood clot.  Get evaluated for sleep disorders (sleep apnea).  Talk to your health care provider about getting a sleep evaluation if you snore a lot or have excessive sleepiness.  Take medicines only as directed by your health care provider.  For some people, aspirin or blood thinners (anticoagulants) are helpful in reducing the risk of forming abnormal blood clots that can lead to stroke. If you have the irregular heart rhythm of atrial fibrillation, you should be on a blood thinner unless there is a good reason you cannot take them.  Understand all your medicine instructions.  Make sure that other conditions (such as anemia or atherosclerosis) are addressed. Get help right away if:  You have sudden weakness or numbness of the face, arm, or leg, especially on one side of the body.  Your face or eyelid droops to one side.  You have sudden confusion.  You have trouble speaking (aphasia) or understanding.  You have sudden trouble seeing in one or both eyes.  You have sudden trouble walking.  You have dizziness.  You have a loss of balance or coordination.  You have a sudden, severe headache with no known cause.  You have new chest pain or an irregular heartbeat. Any of these symptoms may represent a serious problem that is an emergency. Do not wait to see if the symptoms will go away.   Get medical help at once. Call your local emergency services (911 in [place] Do not drive yourself to the hospital. This information is not intended to replace advice given to you by your health care provider. Make sure you discuss any questions you have with your health care provider. Document Released: 09/07/2004 Document Revised: 01/06/2016 Document Reviewed: 01/31/2013 Elsevier  Interactive Patient Education  2017 Elsevier Inc.      Preventing Cerebrovascular Disease Arteries are blood vessels that carry blood that contains oxygen from the heart to all parts of the body. Cerebrovascular disease affects arteries that supply the brain. Any condition that blocks or disrupts blood flow to the brain can cause cerebrovascular disease. Brain cells that lose blood supply start to die within minutes (stroke). Stroke is the main danger of cerebrovascular disease. Atherosclerosis and high blood pressure are common causes of cerebrovascular disease. Atherosclerosis is narrowing and hardening of an artery that results when fat, cholesterol, calcium, or other substances (plaque) build up inside an artery. Plaque reduces blood flow through the artery. High blood pressure increases the risk of bleeding inside the brain. Making diet and lifestyle changes to prevent atherosclerosis and high blood pressure lowers your risk of cerebrovascular disease. What nutrition changes can be made?  Eat more fruits, vegetables, and whole grains.  Reduce how much saturated fat you eat. To do this, eat less red meat and fewer full-fat dairy products.  Eat healthy proteins instead of red meat. Healthy proteins include:  Fish. Eat fish that contains heart-healthy omega-3 fatty acids, twice a week. Examples include salmon, albacore tuna, mackerel, and herring.  Chicken.  Nuts.  Low-fat or nonfat yogurt.  Avoid processed meats, like bacon and lunchmeat.  Avoid foods that contain:  A lot of sugar, such as sweets and drinks with added sugar.  A lot of salt (sodium). Avoid adding extra salt to your food, as told by your health care provider.  Trans fats, such as margarine and baked goods. Trans fats may be listed as "partially hydrogenated oils" on food labels.  Check food labels to see how much sodium, sugar, and trans fats are in foods.  Use vegetable oils that contain low amounts of  saturated fat, such as olive oil or canola oil. What lifestyle changes can be made?  Drink alcohol in moderation. This means no more than 1 drink a day for nonpregnant women and 2 drinks a day for men. One drink equals 12 oz of beer, 5 oz of wine, or 1 oz of hard liquor.  If you are overweight, ask your health care provider to recommend a weight-loss plan for you. Losing 5-10 lb (2.2-4.5 kg) can reduce your risk of diabetes, atherosclerosis, and high blood pressure.  Exercise for 30?60 minutes on most days, or as much as told by your health care provider.  Do moderate-intensity exercise, such as brisk walking, bicycling, and water aerobics. Ask your health care provider which activities are safe for you.  Do not use any products that contain nicotine or tobacco, such as cigarettes and e-cigarettes. If you need help quitting, ask your health care provider. Why are these changes important? Making these changes lowers your risk of many diseases that can cause cerebrovascular disease and stroke. Stroke is a leading cause of death and disability. Making these changes also improves your overall health and quality of life. What can I do to lower my risk? The following factors make you more likely to develop cerebrovascular disease:  Being overweight.  Smoking.  Being physically   inactive.  Eating a high-fat diet.  Having certain health conditions, such as:  Diabetes.  High blood pressure.  Heart disease.  Atherosclerosis.  High cholesterol.  Sickle cell disease. Talk with your health care provider about your risk for cerebrovascular disease. Work with your health care provider to control diseases that you have that may contribute to cerebrovascular disease. Your health care provider may prescribe medicines to help prevent major causes of cerebrovascular disease. Where to find more information: Learn more about preventing cerebrovascular disease from:  National Heart, Lung, and  Blood Institute: www.nhlbi.nih.gov/health/health-topics/topics/stroke  Centers for Disease Control and Prevention: cdc.gov/stroke/about.htm Summary  Cerebrovascular disease can lead to a stroke.  Atherosclerosis and high blood pressure are major causes of cerebrovascular disease.  Making diet and lifestyle changes can reduce your risk of cerebrovascular disease.  Work with your health care provider to get your risk factors under control to reduce your risk of cerebrovascular disease. This information is not intended to replace advice given to you by your health care provider. Make sure you discuss any questions you have with your health care provider. Document Released: 08/15/2015 Document Revised: 02/18/2016 Document Reviewed: 08/15/2015 Elsevier Interactive Patient Education  2017 Elsevier Inc.  

## 2016-11-27 NOTE — Progress Notes (Signed)
Chief Complaint: Follow up Extracranial Carotid Artery Stenosis   History of Present Illness  Thomas [name] is a 83 y.o. male  patient of Dr. Bridgett [name] who is s/p left carotid endarterectomy on 03-05-13.  The patient denies any history of TIA or stroke symptoms; specifically he denies a history of amaurosis fugax or monocular blindness, unilateral facial drooping, hemiplegia, or receptive or expressive aphasia.   He denies any claudication symptoms with walking.  He is scheduled for a lung biopsy on 12-04-16 for evaluation of right side lung mass, is been seeing Dr. Elsworth [name], pulmonologist.  Pt denies dyspnea currently. He denies headaches, dizziness, or chest pain.   Pt Diabetic: no Pt smoker: former smoker, quit in the 1960's   Pt meds include: Statin : yes ASA: no Other anticoagulants/antiplatelets: Eliquis for atrial fib.    Past Medical History:  Diagnosis Date  . Adenomatous colon polyp 2009  . Allergy   . Carotid artery occlusion   . Diverticulosis of colon (without mention of hemorrhage)   . ED (erectile dysfunction)   . Esophageal stricture   . Esophagitis   . GERD (gastroesophageal reflux disease)   . Hiatal hernia   . Hypertension   . Prostate cancer ([place])   . Unspecified gastritis and gastroduodenitis without mention of hemorrhage     Social History Social History  Substance Use Topics  . Smoking status: Never Smoker  . Smokeless tobacco: Never Used  . Alcohol use 0.0 oz/week     Comment: daily--one alcohol drink a day    Family History Family History  Problem Relation Age of Onset  . Heart disease Father     unclear specifics  . Cancer Mother   . Breast cancer Sister   . Hyperlipidemia Daughter   . Hypertension Daughter   . Hypertension Son     Surgical History Past Surgical History:  Procedure Laterality Date  . ENDARTERECTOMY Left 03/05/2013   Procedure: ENDARTERECTOMY CAROTID;  Surgeon: Thomas Emerald Mountain, MD;  Location: [facility];  Service:  Vascular;  Laterality: Left;  . ESOPHAGOGASTRODUODENOSCOPY (EGD) WITH ESOPHAGEAL DILATION    . PROSTATE SURGERY     Laser surgery    Allergies  Allergen Reactions  . Ace Inhibitors Hives  . Penicillins Swelling    Swelling  of the face. Has patient had a PCN reaction causing immediate rash, facial/tongue/throat swelling, SOB or lightheadedness with hypotension: YES Has patient had a PCN reaction causing severe rash involving mucus membranes or skin necrosis: NO Has patient had a PCN reaction that required hospitalizationNO Has patient had a PCN reaction occurring within the last 10 years: NO If all of the above answers are "NO", then may proceed with Cephalosporin use.    Current Outpatient Prescriptions  Medication Sig Dispense Refill  . amLODipine (NORVASC) 5 MG tablet Take 1 tablet (5 mg total) by mouth daily. 30 tablet 3  . cloNIDine (CATAPRES) 0.1 MG tablet TAKE ONE TABLET BY MOUTH THREE TIMES DAILY 90 tablet 4  . ELIQUIS 2.5 MG TABS tablet Take 2.5 mg by mouth 2 (two) times daily.  1  . rosuvastatin (CRESTOR) 10 MG tablet Take 1 tablet (10 mg total) by mouth daily. 92 tablet 3  . benzonatate (TESSALON) 100 MG capsule Take 1 capsule (100 mg total) by mouth 2 (two) times daily as needed for cough. (Patient not taking: Reported on 11/27/2016) 20 capsule 0  . fluticasone (FLONASE) 50 MCG/ACT nasal spray   0  . omeprazole (PRILOSEC) 40 MG capsule TAKE ONE  CAPSULE BY MOUTH ONCE DAILY (Patient not taking: Reported on 11/27/2016) 60 capsule 7  . oxybutynin (DITROPAN) 5 MG tablet 1 tablet daily when necessary (Patient not taking: Reported on 11/27/2016) 100 tablet 3  . potassium chloride SA (K-DUR,KLOR-CON) 20 MEQ tablet Take 20 mEq by mouth 2 (two) times daily.    . valsartan (DIOVAN) 40 MG tablet Take 1 tablet (40 mg total) by mouth daily. (Patient not taking: Reported on 11/27/2016) 30 tablet 0   No current facility-administered medications for this visit.     Review of Systems : See HPI  for pertinent positives and negatives.  Physical Examination  Vitals:   11/27/16 1312 11/27/16 1314  BP: (!) 160/98 (!) 172/102  Pulse: 83   Resp: 20   Temp: 98.2 F (36.8 C)   TempSrc: Oral   SpO2: 98%   Weight: 173 lb (78.5 kg)   Height: 5\' 11"  (1.803 m)    Body mass index is 24.13 kg/m.  General: A&O x 3, WDWN, advanced stage of dental decay of all teeth  Eyes: PERRLA  Neck: Supple, no nuchal rigidity  Pulmonary: Sym exp, good air movt in bilateral anterior and right posterior fields; little air movement in all left posterior fields.  Cardiac: Irregular rhythm with controlled rate, no detected murmur  Vascular: Vessel Right Left  Radial Palpable Palpable  Brachial Palpable Palpable  Carotid Palpable, without bruit Palpable, without bruit  Aorta Not palpable N/A  Popliteal Not palpable Not palpable  PT Faintly Palpable Faintly Palpable  DP Faintly Palpable Faintly Palpable   Gastrointestinal: soft, NTND, -G/R, - HSM, - palpable masses, - CVAT B,   Musculoskeletal: M/S 5/5 throughout , Extremities without ischemic changes   Neurologic: CN 2-12 intact except has some hearing loss, Pain and light touch intact in extremities , Motor exam as listed above    Assessment: Thomas [name] is a 83 y.o. male who is s/p left carotid endarterectomy on 03-05-13. He has no history of stroke or TIA. He quit smoking in the 1960's and dos not have DM. He stays physically active.  He takes a statin; he also takes Eliquis for atrial fib.   I advised pt and his son to call pt's PCP office and let them know that his blood pressure is elevated now.  He denies headaches, dizziness, dyspnea, or chest pain.   DATA  Today's carotid duplex suggests <40% right ICA stenosis and no restenosis of left ICA (CEA site). Bilateral vertebral artery flow is antegrade.  Bilateral subclavian artery waveforms are normal.  No significant change compared to the  last exam on 09-13-15.   Plan: Follow-up in 1 year with Carotid Duplex scan.    I discussed in depth with the patient the nature of atherosclerosis, and emphasized the importance of maximal medical management including strict control of blood pressure, blood glucose, and lipid levels, obtaining regular exercise, and continued cessation of smoking.  The patient is aware that without maximal medical management the underlying atherosclerotic disease process will progress, limiting the benefit of any interventions. The patient was given information about stroke prevention and what symptoms should prompt the patient to seek immediate medical care. Thank you for allowing [place] to participate in this patient's care.  Clemon Chambers, RN, MSN, FNP-C [facility] Office: [phone]  Clinic Physician: Trula Slade  11/27/16 1:16 PM

## 2016-11-28 NOTE — Addendum Note (Signed)
Addended by: Lianne Cure A on: 11/28/2016 09:23 AM   Modules accepted: Orders

## 2016-12-01 ENCOUNTER — Other Ambulatory Visit: Payer: Self-pay | Admitting: Radiology

## 2016-12-04 ENCOUNTER — Ambulatory Visit (HOSPITAL_COMMUNITY)
Admission: RE | Admit: 2016-12-04 | Discharge: 2016-12-04 | Disposition: A | Discharge status: Home / Self Care | Payer: Medicare Other | Source: Ambulatory Visit | Attending: Pulmonary Disease | Admitting: Pulmonary Disease

## 2016-12-04 DIAGNOSIS — C459 Mesothelioma, unspecified: Secondary | ICD-10-CM | POA: Diagnosis not present

## 2016-12-04 DIAGNOSIS — J90 Pleural effusion, not elsewhere classified: Secondary | ICD-10-CM

## 2016-12-04 DIAGNOSIS — J929 Pleural plaque without asbestos: Secondary | ICD-10-CM | POA: Diagnosis not present

## 2016-12-04 LAB — APTT: APTT: 38 s — AB (ref 24–36)

## 2016-12-04 LAB — CBC
HEMATOCRIT: 34.8 % — AB (ref 39.0–52.0)
Hemoglobin: 11.2 g/dL — ABNORMAL LOW (ref 13.0–17.0)
MCH: 28.7 pg (ref 26.0–34.0)
MCHC: 32.2 g/dL (ref 30.0–36.0)
MCV: 89.2 fL (ref 78.0–100.0)
PLATELETS: 295 10*3/uL (ref 150–400)
RBC: 3.9 MIL/uL — ABNORMAL LOW (ref 4.22–5.81)
RDW: 14.8 % (ref 11.5–15.5)
WBC: 5.1 10*3/uL (ref 4.0–10.5)

## 2016-12-04 LAB — PROTIME-INR
INR: 1.36
Prothrombin Time: 16.8 seconds — ABNORMAL HIGH (ref 11.4–15.2)

## 2016-12-04 MED ORDER — HYDROCODONE-ACETAMINOPHEN 5-325 MG PO TABS
1.0000 | ORAL_TABLET | ORAL | Status: DC | PRN
  Filled 2016-12-04: qty 1

## 2016-12-04 MED ORDER — MIDAZOLAM HCL 2 MG/2ML IJ SOLN
INTRAMUSCULAR | Status: AC
  Filled 2016-12-04: qty 2

## 2016-12-04 MED ORDER — MIDAZOLAM HCL 2 MG/2ML IJ SOLN
INTRAMUSCULAR | Status: AC | PRN
  Administered 2016-12-04 (×2): 0.5 mg via INTRAVENOUS

## 2016-12-04 MED ORDER — FENTANYL CITRATE (PF) 100 MCG/2ML IJ SOLN
INTRAMUSCULAR | Status: AC | PRN
  Administered 2016-12-04 (×2): 25 ug via INTRAVENOUS

## 2016-12-04 MED ORDER — SODIUM CHLORIDE 0.9 % IV SOLN: INTRAVENOUS | Status: DC

## 2016-12-04 MED ORDER — SODIUM CHLORIDE 0.9 % IV SOLN
INTRAVENOUS | Status: AC | PRN
  Administered 2016-12-04: 10 mL/h via INTRAVENOUS

## 2016-12-04 MED ORDER — LIDOCAINE HCL 1 % IJ SOLN
INTRAMUSCULAR | Status: AC
  Filled 2016-12-04: qty 20

## 2016-12-04 MED ORDER — FENTANYL CITRATE (PF) 100 MCG/2ML IJ SOLN
INTRAMUSCULAR | Status: AC
  Filled 2016-12-04: qty 2

## 2016-12-04 NOTE — Sedation Documentation (Signed)
Patient is resting comfortably. 

## 2016-12-04 NOTE — Discharge Instructions (Addendum)
Needle Biopsy of the Lung, Care After °This sheet gives you information about how to care for yourself after your procedure. Your health care provider may also give you more specific instructions. If you have problems or questions, contact your health care provider. °What can I expect after the procedure? °After the procedure, it is common to have: °· Soreness, pain, and tenderness where a tissue sample was taken (biopsy site). °· A cough. °· A sore throat. °Follow these instructions at home: °Biopsy site care  °· Follow instructions from your health care provider about when to remove the bandage that was placed on the biopsy site. °· Keep the bandage dry until it has been removed. °· Check your biopsy site every day for signs of infection. Check for: °¨ More redness, swelling, or pain. °¨ More fluid or blood. °¨ Warmth to the touch. °¨ Pus or a bad smell. °General instructions  °· Rest as directed by your health care provider. Ask your health care provider what activities are safe for you. °· Do not take baths, swim, or use a hot tub until your health care provider approves. °· Take over-the-counter and prescription medicines only as told by your health care provider. °· If you have airplane travel scheduled, talk with your health care provider about when it is safe for you to travel by airplane. °· It is up to you to get the results of your procedure. Ask your health care provider, or the department that is doing the procedure, when your results will be ready. °· Keep all follow-up visits as told by your health care provider. This is important. °Contact a health care provider if: °· You have more redness, swelling, or pain around your biopsy site. °· You have more fluid or blood coming from your biopsy site. °· Your biopsy site feels warm to the touch. °· You have pus or a bad smell coming from your biopsy site. °· You have a fever. °· You have pain that does not get better with medicine. °Get help right away  if: °· You have problems breathing. °· You have chest pain. °· You cough up blood. °· You faint. °· You have a fast heart rate. °Summary °· After a needle biopsy of the lung, it is common to have a cough, a sore throat, or soreness, pain, and tenderness where a tissue sample was taken (biopsy site). °· You should check your biopsy area every day for signs of infection, including pus or a bad smell, warmth, more fluid or blood, or more redness, swelling, or pain. °· You should not take baths, swim, or use a hot tub until your health care provider approves. °· It is up to you to get the results of your procedure. Ask your health care provider, or the department that is doing the procedure, when your results will be ready. °This information is not intended to replace advice given to you by your health care provider. Make sure you discuss any questions you have with your health care provider. °Document Released: 05/28/2007 Document Revised: 06/21/2016 Document Reviewed: 06/21/2016 °Elsevier Interactive Patient Education © 2017 Elsevier Inc. °Moderate Conscious Sedation, Adult, Care After °These instructions provide you with information about caring for yourself after your procedure. Your health care provider may also give you more specific instructions. Your treatment has been planned according to current medical practices, but problems sometimes occur. Call your health care provider if you have any problems or questions after your procedure. °What can I expect after the   procedure? °After your procedure, it is common: °· To feel sleepy for several hours. °· To feel clumsy and have poor balance for several hours. °· To have poor judgment for several hours. °· To vomit if you eat too soon. °Follow these instructions at home: °For at least 24 hours after the procedure:  ° °· Do not: °¨ Participate in activities where you could fall or become injured. °¨ Drive. °¨ Use heavy machinery. °¨ Drink alcohol. °¨ Take sleeping  pills or medicines that cause drowsiness. °¨ Make important decisions or sign legal documents. °¨ Take care of children on your own. °· Rest. °Eating and drinking  °· Follow the diet recommended by your health care provider. °· If you vomit: °¨ Drink water, juice, or soup when you can drink without vomiting. °¨ Make sure you have little or no nausea before eating solid foods. °General instructions  °· Have a responsible adult stay with you until you are awake and alert. °· Take over-the-counter and prescription medicines only as told by your health care provider. °· If you smoke, do not smoke without supervision. °· Keep all follow-up visits as told by your health care provider. This is important. °Contact a health care provider if: °· You keep feeling nauseous or you keep vomiting. °· You feel light-headed. °· You develop a rash. °· You have a fever. °Get help right away if: °· You have trouble breathing. °This information is not intended to replace advice given to you by your health care provider. Make sure you discuss any questions you have with your health care provider. °Document Released: 05/21/2013 Document Revised: 01/03/2016 Document Reviewed: 11/20/2015 °Elsevier Interactive Patient Education © 2017 Elsevier Inc. ° °

## 2016-12-04 NOTE — Sedation Documentation (Signed)
Dr Anselm Pancoast aware of coag results and ok to proceed with procedure.

## 2016-12-04 NOTE — H&P (Signed)
Chief Complaint: Patient was seen in consultation today for biopsy of left pleural nodule/mass at the request of Alva,Rakesh V  Referring Physician(s): Rigoberto Noel  Supervising Physician: Markus Daft  Patient Status: [facility] - Out-pt  History of Present Illness: Thomas [name] is a 83 y.o. male being worked up for left sided pleural thickening, which is hypermetabolic on PEt and therefore concerning for malignancy. He also has large pleural effusion within the pleural rind, which has been tapped(thora) several times. He is referred for biopsy of the pleural nodularity/mass PMHx, meds, labs, imaging reviewed. He has held his Eliquis for the past 48+ hours as instructed. Has been NPO this am. Family at bedside  Past Medical History:  Diagnosis Date  . Adenomatous colon polyp 2009  . Allergy   . Carotid artery occlusion   . Diverticulosis of colon (without mention of hemorrhage)   . ED (erectile dysfunction)   . Esophageal stricture   . Esophagitis   . GERD (gastroesophageal reflux disease)   . Hiatal hernia   . Hypertension   . Prostate cancer ([place])   . Unspecified gastritis and gastroduodenitis without mention of hemorrhage     Past Surgical History:  Procedure Laterality Date  . ENDARTERECTOMY Left 03/05/2013   Procedure: ENDARTERECTOMY CAROTID;  Surgeon: Conrad Wayne Lakes, MD;  Location: [place];  Service: Vascular;  Laterality: Left;  . ESOPHAGOGASTRODUODENOSCOPY (EGD) WITH ESOPHAGEAL DILATION    . PROSTATE SURGERY     Laser surgery    Allergies: Ace inhibitors and Penicillins  Medications: Prior to Admission medications   Medication Sig Start Date End Date Taking? Authorizing Provider  amLODipine (NORVASC) 5 MG tablet Take 1 tablet (5 mg total) by mouth daily. 10/16/16  Yes Almyra Deforest, PA  cloNIDine (CATAPRES) 0.1 MG tablet TAKE ONE TABLET BY MOUTH THREE TIMES DAILY 09/18/16  Yes Dorena Cookey, MD  ELIQUIS 5 MG TABS tablet Take 1 tablet by mouth 2 (two) times daily.  11/12/16  Yes Historical Provider, MD  rosuvastatin (CRESTOR) 10 MG tablet Take 1 tablet (10 mg total) by mouth daily. 05/29/16  Yes Marin Olp, MD     Family History  Problem Relation Age of Onset  . Heart disease Father     unclear specifics  . Cancer Mother   . Breast cancer Sister   . Hyperlipidemia Daughter   . Hypertension Daughter   . Hypertension Son     Social History   Social History  . Marital status: Married    Spouse name: N/A  . Number of children: N/A  . Years of education: N/A   Social History Main Topics  . Smoking status: Never Smoker  . Smokeless tobacco: Never Used  . Alcohol use 0.0 oz/week     Comment: daily--one alcohol drink a day  . Drug use: No  . Sexual activity: Not on file   Other Topics Concern  . Not on file   Social History Narrative   Married 1948. 6 children. 9 grandkids. 2 greatgrandkids.  (One son was killed in the [place])      [employer]- honorable discharge.       Hobbies: golfing about twice a month.      Review of Systems: A 12 point ROS discussed and pertinent positives are indicated in the HPI above.  All other systems are negative.  Review of Systems  Vital Signs: BP (!) 173/108 (BP Location: Right Arm)   Pulse 69   Temp 97.7  F (36.5 C) (Oral)   Ht 5\' 11"  (1.803 m)   Wt 188 lb (85.3 kg)   SpO2 97%   BMI 26.22 kg/m   Physical Exam  Constitutional: He is oriented to person, place, and time. He appears well-developed and well-nourished. No distress.  HENT:  Head: Normocephalic.  Mouth/Throat: Oropharynx is clear and moist.  Neck: Normal range of motion. No tracheal deviation present.  Cardiovascular: Normal rate, regular rhythm and normal heart sounds.   Pulmonary/Chest: Effort normal and breath sounds normal. No respiratory distress.  Abdominal: Soft. He exhibits no mass. There is no tenderness.  Neurological: He is alert and oriented to person, place, and time.    Skin: Skin is warm and dry.  Psychiatric: He has a normal mood and affect. Judgment normal.    Mallampati Score:  MD Evaluation Airway: WNL Heart: WNL Abdomen: WNL Chest/ Lungs: WNL ASA  Classification: 3 Mallampati/Airway Score: Two  Imaging: Nm Pet Image Initial (pi) Skull Base To Thigh  Result Date: 11/13/2016 CLINICAL DATA:  Initial treatment strategy for complex left pleural effusion. Cytology on thoracentesis suspicious for atypical malignancy. Patient is a nonsmoker, although has a history of prostate cancer. No known asbestos exposure. PET-CT obtained to guided pleural biopsy. EXAM: NUCLEAR MEDICINE PET SKULL BASE TO THIGH TECHNIQUE: 8.53 mCi F-18 FDG was injected intravenously. Full-ring PET imaging was performed from the skull base to thigh after the radiotracer. CT data was obtained and used for attenuation correction and anatomic localization. FASTING BLOOD GLUCOSE:  Value: 108 mg/dl COMPARISON:  Chest CT 02/10/2016 and 10/27/2016. FINDINGS: NECK No hypermetabolic cervical lymph nodes are identified.There are no lesions of the pharyngeal mucosal space. CHEST Again demonstrated is a large complex left pleural effusion with near complete collapse of the left lung. This pleural effusion has a thick peripheral hypermetabolic rind. The greatest hypermetabolic activity is posterior to the descending aorta at the level of the left atrium, demonstrating an SUV max of 6.2. No dominant pleural-based mass is identified. There is a small amount of pleural based calcification on the left which is unchanged. There is no hypermetabolic activity within the largely collapsed left lung. The right lung and right pleural space demonstrate no significant findings. There are small mediastinal and hilar lymph nodes which demonstrate metabolic activity slightly greater than blood pool, greatest within an 8 mm prevascular node (image 61, SUV max 3.7). Diffuse atherosclerosis of the aorta, great vessels and  coronary arteries noted. ABDOMEN/PELVIS There is no hypermetabolic activity within the liver, adrenal glands, spleen or pancreas. There is no hypermetabolic nodal activity. The liver demonstrates steatosis and a 12 mm cyst. There is aortic and branch vessel atherosclerosis. SKELETON There is no hypermetabolic activity to suggest osseous metastatic disease. IMPRESSION: 1. The large complex left pleural effusion demonstrates a surrounding thickened hypermetabolic rind worrisome for malignancy (most likely adenocarcinoma or mesothelioma). There is no dominant mass, although the hypermetabolic activity is greatest posteromedially. 2. Near complete collapse of the left lung without apparent pulmonary parenchymal hypermetabolic activity. 3. Nonspecific mildly hypermetabolic small lymph nodes in the mediastinum and both hila, potentially reactive. 4. No evidence of distant metastases. Electronically Signed   By: Richardean Sale M.D.   On: 11/13/2016 11:09    Labs:  CBC:  Recent Labs  02/16/16 1003 09/19/16 1730  WBC 5.3 5.4  HGB 13.7 11.7*  HCT 40.0 35.2*  PLT 265.0 263    COAGS: No results for input(s): INR, APTT in the last 8760 hours.  BMP:  Recent Labs  07/25/16 1000 08/09/16 1202 09/19/16 1730 09/27/16 1037  NA 138 141 137 137  K 3.5 3.6 3.0* 3.3*  CL 103 101 102 98  CO2 25 24 24 26   GLUCOSE 114* 100* 139* 106*  BUN 18 14 16 15   CALCIUM 8.9 8.7 9.0 8.8  CREATININE 1.31 1.24* 1.34* 1.35*  GFRNONAA  --   --  46*  --   GFRAA  --   --  53*  --     LIVER FUNCTION TESTS:  Recent Labs  02/16/16 1003 08/09/16 1202  BILITOT 0.6 0.4  AST 15 11  ALT 12 10  ALKPHOS 65 79  PROT 7.8 6.6  ALBUMIN 4.0 3.4*    TUMOR MARKERS: No results for input(s): AFPTM, CEA, CA199, CHROMGRNA in the last 8760 hours.  Assessment and Plan: Hypermetabolic left pleural rind/mass For CT guided biopsy today Labs drawn/pending Risks and Benefits discussed with the patient including, but not  limited to bleeding, hemoptysis, respiratory failure requiring intubation, infection, pneumothorax requiring chest tube placement, stroke from air embolism or even death. All of the patient's questions were answered, patient is agreeable to proceed. Consent signed and in chart.    Thank you for this interesting consult.  I greatly enjoyed meeting Thomas [name] and look forward to participating in their care.  A copy of this report was sent to the requesting provider on this date.  Electronically Signed: Ascencion Dike 12/04/2016, 10:41 AM   I spent a total of 20 minutes in face to face in clinical consultation, greater than 50% of which was counseling/coordinating care for biopsy of left pleural mass

## 2016-12-04 NOTE — Procedures (Signed)
  Pre-operative Diagnosis: Pleural thickening and large left pleural effusion       Post-operative Diagnosis: Pleural thickening and large left pleural effusion    Indications:  Needs tissue diagnosis  Procedure: CT guided left pleural biopsy  Findings: Extensive left pleural thickening with large left pleural effusion  Complications: None     EBL: Minimal  Plan: Bedrest 2 hours, then discharge to home.

## 2016-12-07 ENCOUNTER — Ambulatory Visit (INDEPENDENT_AMBULATORY_CARE_PROVIDER_SITE_OTHER): Payer: Medicare Other | Admitting: Pulmonary Disease

## 2016-12-07 ENCOUNTER — Encounter: Payer: Self-pay | Admitting: Pulmonary Disease

## 2016-12-07 VITALS — BP 124/66 | HR 79 | Ht 71.0 in | Wt 169.6 lb

## 2016-12-07 DIAGNOSIS — C459 Mesothelioma, unspecified: Secondary | ICD-10-CM

## 2016-12-07 DIAGNOSIS — F5104 Psychophysiologic insomnia: Secondary | ICD-10-CM

## 2016-12-07 DIAGNOSIS — G4700 Insomnia, unspecified: Secondary | ICD-10-CM | POA: Insufficient documentation

## 2016-12-07 DIAGNOSIS — C450 Mesothelioma of pleura: Secondary | ICD-10-CM

## 2016-12-07 NOTE — Patient Instructions (Addendum)
Referral to oncology  Biopsy showed mesothelioma  Okay to take melatonin 3 mg -1-2 capsules about 1 hour before bedtime

## 2016-12-07 NOTE — Assessment & Plan Note (Signed)
Trial of melatonin 1 hour before sleep

## 2016-12-07 NOTE — Assessment & Plan Note (Signed)
Unfortunately this seems to be due to malignant mesothelioma He will be referred to oncology He does have a history of asbestos exposure. Should he develop dyspnea in the future, we can offer him Pleurx catheter- he does not want this at the current time

## 2016-12-07 NOTE — Progress Notes (Signed)
   Subjective:    Patient ID: Henrik Orihuela, male    DOB: 04/01/1926, 83 y.o.   MRN: [id number]  HPI  20 yobm never smoker  h/o serving in [employer] including [place] but denies knowledge of asbestos exp. Worked in sonar room    Accompanied by his son , We reviewed biopsy results which showed mesothelioma.  He is again asymptomatic and denies dyspnea or cough He improved after his last thoracentesis  He complains of difficulty getting to sleep and frequent nocturia for which she has been started on oxybutynin by the [place]  Significant tests/ events  - L tcentesis 02/21/2016 : 1.5 liters of hazy, yellow fluid >Exudate/ wbc 1655 L >>P with cyt atypical worrisome for adenoca  10/20/16 1.3 L thora  10/27/16 CT chest showed large left effusion with thickened pleura in atelectasis with left lung  Review of Systems neg for any significant sore throat, dysphagia, itching, sneezing, nasal congestion or excess/ purulent secretions, fever, chills, sweats, unintended wt loss, pleuritic or exertional cp, hempoptysis, orthopnea pnd or change in chronic leg swelling. Also denies presyncope, palpitations, heartburn, abdominal pain, nausea, vomiting, diarrhea or change in bowel or urinary habits, dysuria,hematuria, rash, arthralgias, visual complaints, headache, numbness weakness or ataxia.     Objective:   Physical Exam   Gen. Pleasant, well-nourished, in no distress ENT - no thrush, no post nasal drip Neck: No JVD, no thyromegaly, no carotid bruits Lungs: no use of accessory muscles,  dullness to percussion on left , decreased on left without rales or rhonchi  Cardiovascular: Rhythm regular, heart sounds  normal, no murmurs or gallops, no peripheral edema Musculoskeletal: No deformities, no cyanosis or clubbing         Assessment & Plan:

## 2016-12-11 ENCOUNTER — Telehealth: Payer: Self-pay | Admitting: *Deleted

## 2016-12-11 DIAGNOSIS — C450 Mesothelioma of pleura: Secondary | ICD-10-CM

## 2016-12-11 NOTE — Telephone Encounter (Signed)
Oncology Nurse Navigator Documentation  Oncology Nurse Navigator Flowsheets 12/11/2016  Navigator Location [facility]  Referral date to RadOnc/MedOnc 12/08/2016  Navigator Encounter Type Telephone/I received referral on Thomas [name].  I called and spoke with his daughter who is HIPPA approved.  I updated her on appt's for [place] on 12/21/16.  She verbalized understanding of appt.  Telephone Outgoing Call  Treatment Phase Pre-Tx/Tx Discussion  Barriers/Navigation Needs Coordination of Care  Interventions Coordination of Care  Coordination of Care Appts  Acuity Level 2  Acuity Level 2 Assistance expediting appointments  Time Spent with Patient 30

## 2016-12-12 ENCOUNTER — Ambulatory Visit: Payer: Medicare Other | Admitting: Pulmonary Disease

## 2016-12-12 DIAGNOSIS — I639 Cerebral infarction, unspecified: Secondary | ICD-10-CM

## 2016-12-12 HISTORY — DX: Cerebral infarction, unspecified: I63.9

## 2016-12-13 ENCOUNTER — Inpatient Hospital Stay (HOSPITAL_COMMUNITY)
Admission: EM | Admit: 2016-12-13 | Discharge: 2016-12-16 | DRG: 065 | Disposition: A | Discharge status: Home Health | Payer: Medicare Other | Attending: Internal Medicine | Admitting: Internal Medicine

## 2016-12-13 ENCOUNTER — Telehealth: Payer: Self-pay | Admitting: Pulmonary Disease

## 2016-12-13 ENCOUNTER — Emergency Department (HOSPITAL_COMMUNITY): Payer: Medicare Other

## 2016-12-13 ENCOUNTER — Encounter (HOSPITAL_COMMUNITY): Payer: Self-pay | Admitting: Emergency Medicine

## 2016-12-13 DIAGNOSIS — Z7901 Long term (current) use of anticoagulants: Secondary | ICD-10-CM

## 2016-12-13 DIAGNOSIS — I10 Essential (primary) hypertension: Secondary | ICD-10-CM | POA: Diagnosis not present

## 2016-12-13 DIAGNOSIS — I6340 Cerebral infarction due to embolism of unspecified cerebral artery: Principal | ICD-10-CM | POA: Diagnosis present

## 2016-12-13 DIAGNOSIS — I639 Cerebral infarction, unspecified: Secondary | ICD-10-CM | POA: Diagnosis not present

## 2016-12-13 DIAGNOSIS — I69398 Other sequelae of cerebral infarction: Secondary | ICD-10-CM

## 2016-12-13 DIAGNOSIS — R42 Dizziness and giddiness: Secondary | ICD-10-CM | POA: Diagnosis not present

## 2016-12-13 DIAGNOSIS — Z8546 Personal history of malignant neoplasm of prostate: Secondary | ICD-10-CM

## 2016-12-13 DIAGNOSIS — E785 Hyperlipidemia, unspecified: Secondary | ICD-10-CM | POA: Diagnosis present

## 2016-12-13 DIAGNOSIS — I4821 Permanent atrial fibrillation: Secondary | ICD-10-CM | POA: Diagnosis present

## 2016-12-13 DIAGNOSIS — C450 Mesothelioma of pleura: Secondary | ICD-10-CM | POA: Diagnosis present

## 2016-12-13 DIAGNOSIS — I482 Chronic atrial fibrillation: Secondary | ICD-10-CM | POA: Diagnosis not present

## 2016-12-13 DIAGNOSIS — Z79899 Other long term (current) drug therapy: Secondary | ICD-10-CM

## 2016-12-13 DIAGNOSIS — D649 Anemia, unspecified: Secondary | ICD-10-CM | POA: Diagnosis present

## 2016-12-13 DIAGNOSIS — E1169 Type 2 diabetes mellitus with other specified complication: Secondary | ICD-10-CM | POA: Diagnosis present

## 2016-12-13 DIAGNOSIS — E875 Hyperkalemia: Secondary | ICD-10-CM | POA: Diagnosis present

## 2016-12-13 DIAGNOSIS — I672 Cerebral atherosclerosis: Secondary | ICD-10-CM | POA: Diagnosis present

## 2016-12-13 DIAGNOSIS — E1151 Type 2 diabetes mellitus with diabetic peripheral angiopathy without gangrene: Secondary | ICD-10-CM | POA: Diagnosis present

## 2016-12-13 LAB — COMPREHENSIVE METABOLIC PANEL
ALT: 21 U/L (ref 17–63)
AST: 33 U/L (ref 15–41)
Albumin: 3 g/dL — ABNORMAL LOW (ref 3.5–5.0)
Alkaline Phosphatase: 77 U/L (ref 38–126)
Anion gap: 10 (ref 5–15)
BUN: 13 mg/dL (ref 6–20)
CHLORIDE: 101 mmol/L (ref 101–111)
CO2: 23 mmol/L (ref 22–32)
CREATININE: 1.2 mg/dL (ref 0.61–1.24)
Calcium: 8.7 mg/dL — ABNORMAL LOW (ref 8.9–10.3)
GFR calc Af Amer: 60 mL/min — ABNORMAL LOW (ref >60)
GFR, EST NON AFRICAN AMERICAN: 52 mL/min — AB (ref >60)
Glucose, Bld: 130 mg/dL — ABNORMAL HIGH (ref 65–99)
Potassium: 5.2 mmol/L — ABNORMAL HIGH (ref 3.5–5.1)
SODIUM: 134 mmol/L — AB (ref 135–145)
Total Bilirubin: 2.1 mg/dL — ABNORMAL HIGH (ref 0.3–1.2)
Total Protein: 7.2 g/dL (ref 6.5–8.1)

## 2016-12-13 LAB — CBC WITH DIFFERENTIAL/PLATELET
BASOS ABS: 0 10*3/uL (ref 0.0–0.1)
Basophils Relative: 0 %
EOS PCT: 3 %
Eosinophils Absolute: 0.2 10*3/uL (ref 0.0–0.7)
HCT: 36.3 % — ABNORMAL LOW (ref 39.0–52.0)
HEMOGLOBIN: 11.6 g/dL — AB (ref 13.0–17.0)
LYMPHS PCT: 13 %
Lymphs Abs: 0.7 10*3/uL (ref 0.7–4.0)
MCH: 28.4 pg (ref 26.0–34.0)
MCHC: 32 g/dL (ref 30.0–36.0)
MCV: 88.8 fL (ref 78.0–100.0)
Monocytes Absolute: 1.2 10*3/uL — ABNORMAL HIGH (ref 0.1–1.0)
Monocytes Relative: 21 %
NEUTROS PCT: 63 %
Neutro Abs: 3.5 10*3/uL (ref 1.7–7.7)
PLATELETS: 250 10*3/uL (ref 150–400)
RBC: 4.09 MIL/uL — AB (ref 4.22–5.81)
RDW: 15.1 % (ref 11.5–15.5)
WBC: 5.6 10*3/uL (ref 4.0–10.5)

## 2016-12-13 LAB — URINALYSIS, ROUTINE W REFLEX MICROSCOPIC
Bilirubin Urine: NEGATIVE
GLUCOSE, UA: NEGATIVE mg/dL
Hgb urine dipstick: NEGATIVE
Ketones, ur: NEGATIVE mg/dL
LEUKOCYTES UA: NEGATIVE
Nitrite: NEGATIVE
PH: 7 (ref 5.0–8.0)
Protein, ur: NEGATIVE mg/dL
SPECIFIC GRAVITY, URINE: 1.008 (ref 1.005–1.030)

## 2016-12-13 LAB — I-STAT TROPONIN, ED: Troponin i, poc: 0.01 ng/mL (ref 0.00–0.08)

## 2016-12-13 MED ORDER — ROSUVASTATIN CALCIUM 5 MG PO TABS
10.0000 mg | ORAL_TABLET | Freq: Every day | ORAL | Status: DC
  Administered 2016-12-13 – 2016-12-15 (×3): 10 mg via ORAL
  Filled 2016-12-13 (×3): qty 2

## 2016-12-13 MED ORDER — STROKE: EARLY STAGES OF RECOVERY BOOK
Freq: Once | Status: AC
  Administered 2016-12-13
  Filled 2016-12-13: qty 1

## 2016-12-13 MED ORDER — CLONIDINE HCL 0.1 MG PO TABS
0.1000 mg | ORAL_TABLET | Freq: Three times a day (TID) | ORAL | Status: DC
  Administered 2016-12-13 – 2016-12-16 (×8): 0.1 mg via ORAL
  Filled 2016-12-13 (×8): qty 1

## 2016-12-13 MED ORDER — ACETAMINOPHEN 650 MG RE SUPP: 650.0000 mg | RECTAL | Status: DC | PRN

## 2016-12-13 MED ORDER — ACETAMINOPHEN 160 MG/5ML PO SOLN: 650.0000 mg | ORAL | Status: DC | PRN

## 2016-12-13 MED ORDER — APIXABAN 5 MG PO TABS
5.0000 mg | ORAL_TABLET | Freq: Two times a day (BID) | ORAL | Status: DC
  Administered 2016-12-13 – 2016-12-14 (×2): 5 mg via ORAL
  Filled 2016-12-13 (×2): qty 1

## 2016-12-13 MED ORDER — AMLODIPINE BESYLATE 5 MG PO TABS
5.0000 mg | ORAL_TABLET | Freq: Every day | ORAL | Status: DC
  Administered 2016-12-14 – 2016-12-15 (×2): 5 mg via ORAL
  Filled 2016-12-13 (×2): qty 1

## 2016-12-13 MED ORDER — ACETAMINOPHEN 325 MG PO TABS: 650.0000 mg | ORAL_TABLET | ORAL | Status: DC | PRN

## 2016-12-13 NOTE — ED Notes (Signed)
Pt was found to be in afib

## 2016-12-13 NOTE — ED Notes (Signed)
Pt still in MRI. Family in room.

## 2016-12-13 NOTE — Progress Notes (Signed)
Patient admitted from ED. Patient alert and oriented x 4. Patient oriented to room and made comfortable. Tele placed and was verified.

## 2016-12-13 NOTE — Telephone Encounter (Signed)
Needs to get in with his PCP to get checked out. No recommendations at this time-  if worse may have to go to ER I note that oncology appointment is  for 5/10

## 2016-12-13 NOTE — ED Notes (Signed)
Patient transported to X-ray 

## 2016-12-13 NOTE — ED Provider Notes (Signed)
[facility] DEPT [facility] Provider Note   CSN: [id number] Arrival date & time: 12/13/16  1819     History   Chief Complaint Chief Complaint  Patient presents with  . Dizziness    HPI Thomas [name] is a 83 y.o. male.  HPI Patient presents with 2 days of episodic dizziness. States episodes lasts several minutes and then resolved. Describes dizziness as spinning sensation, feeling off-balance and nauseated. Patient states that this morning had an episode around 7 AM. States he fell to his needs. Denies loss of consciousness or head injury. He then had a second episode around 4 PM today. Spontaneously resolved. Denies chest pain but states he's had some shortness of breath. Denies any new lower extremity swelling or pain. No new focal weakness or numbness. No visual changes or speech changes. No recent fever or chills. Past Medical History:  Diagnosis Date  . Adenomatous colon polyp 2009  . Allergy   . Carotid artery occlusion   . Diverticulosis of colon (without mention of hemorrhage)   . ED (erectile dysfunction)   . Esophageal stricture   . Esophagitis   . GERD (gastroesophageal reflux disease)   . Hiatal hernia   . Hypertension   . Prostate cancer ([place])   . Unspecified gastritis and gastroduodenitis without mention of hemorrhage     Patient Active Problem List   Diagnosis Date Noted  . Stroke ([place]) 12/14/2016  . Stroke (cerebrum) ([place]) 12/13/2016  . Acute cerebral infarction ([place])   . Insomnia 12/07/2016  . Atelectasis 10/30/2016  . Alcohol abuse 08/04/2016  . Permanent atrial fibrillation ([place]) 07/21/2016  . Malignant mesothelioma of pleura ([place]) 02/16/2016  . Perforation of right tympanic membrane 11/24/2013  . Carotid stenosis 02/21/2013  . TIA (transient ischemic attack) 02/17/2013  . GERD (gastroesophageal reflux disease) 05/17/2011  . Esophageal dysphagia 05/17/2011  . Stricture and stenosis of esophagus 08/29/2010  . Allergic rhinitis 01/16/2008  . 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 PROSTATE CANCER, HX OF 03/26/2007  . Hyperlipidemia 03/20/2007  . Essential hypertension 03/20/2007    Past Surgical History:  Procedure Laterality Date  . ENDARTERECTOMY Left 03/05/2013   Procedure: ENDARTERECTOMY CAROTID;  Surgeon: Conrad Wolfdale, MD;  Location: [facility];  Service: Vascular;  Laterality: Left;  . ESOPHAGOGASTRODUODENOSCOPY (EGD) WITH ESOPHAGEAL DILATION    . PROSTATE SURGERY     Laser surgery       Home Medications    Prior to Admission medications   Medication Sig Start Date End Date Taking? Authorizing Provider  amLODipine (NORVASC) 5 MG tablet Take 1 tablet (5 mg total) by mouth daily. 10/16/16  Yes Almyra Deforest, PA  cloNIDine (CATAPRES) 0.1 MG tablet TAKE ONE TABLET BY MOUTH THREE TIMES DAILY 09/18/16  Yes Dorena Cookey, MD  ELIQUIS 5 MG TABS tablet Take 1 tablet by mouth 2 (two) times daily. 11/12/16  Yes Historical Provider, MD  rosuvastatin (CRESTOR) 10 MG tablet Take 1 tablet (10 mg total) by mouth daily. 05/29/16  Yes Marin Olp, MD    Family History Family History  Problem Relation Age of Onset  . Heart disease Father     unclear specifics  . Cancer Mother   . Breast cancer Sister   . Hyperlipidemia Daughter   . Hypertension Daughter   . Hypertension Son     Social History Social History  Substance Use Topics  . Smoking status: Never Smoker  . Smokeless tobacco: Never Used  . Alcohol use 0.0 oz/week     Comment:  daily--one alcohol drink a day     Allergies   Ace inhibitors and Penicillins   Review of Systems Review of Systems  Constitutional: Negative for chills and fever.  HENT: Negative for congestion, rhinorrhea, sinus pain and sore throat.   Eyes: Negative for visual disturbance.  Respiratory: Positive for shortness of breath. Negative for cough and wheezing.   Cardiovascular: Negative for chest pain, palpitations and leg swelling.  Gastrointestinal: Positive for nausea. Negative for abdominal  pain, diarrhea and vomiting.  Genitourinary: Negative for difficulty urinating, flank pain, frequency and hematuria.  Musculoskeletal: Positive for gait problem. Negative for back pain, myalgias and neck pain.  Skin: Negative for rash and wound.  Neurological: Positive for dizziness and light-headedness. Negative for weakness, numbness and headaches.  All other systems reviewed and are negative.    Physical Exam Updated Vital Signs BP (!) 157/76 (BP Location: Left Arm)   Pulse 67   Temp 97.7 F (36.5 C) (Oral)   Resp 18   Ht 5\' 11"  (1.803 m)   Wt 163 lb 5.8 oz (74.1 kg)   SpO2 97%   BMI 22.78 kg/m   Physical Exam  Constitutional: He is oriented to person, place, and time. He appears well-developed and well-nourished. No distress.  HENT:  Head: Normocephalic and atraumatic.  Mouth/Throat: Oropharynx is clear and moist. No oropharyngeal exudate.  Eyes: EOM are normal. Pupils are equal, round, and reactive to light.  Few of horizontal nystagmus  Neck: Normal range of motion. Neck supple.  No bruits appreciated  Cardiovascular: Normal rate.  Exam reveals no gallop and no friction rub.   No murmur heard. Irregularly irregular  Pulmonary/Chest: Effort normal. No respiratory distress. He has no wheezes. He has no rales. He exhibits no tenderness.  Diminished breath sounds in the left mid lung field and base  Abdominal: Soft. Bowel sounds are normal. There is no tenderness. There is no rebound and no guarding.  Musculoskeletal: Normal range of motion. He exhibits no edema or tenderness.  No lower extremity swelling, asymmetry or tenderness.  Neurological: He is alert and oriented to person, place, and time.  Patient is alert and oriented x3 with clear, goal oriented speech. Patient has 5/5 motor in all extremities. Sensation is intact to light touch. Bilateral finger-to-nose is normal with no signs of dysmetria.   Skin: Skin is warm and dry. Capillary refill takes less than 2  seconds. No rash noted. He is not diaphoretic. No erythema.  Psychiatric: He has a normal mood and affect. His behavior is normal.  Nursing note and vitals reviewed.    ED Treatments / Results  Labs (all labs ordered are listed, but only abnormal results are displayed) Labs Reviewed  CBC WITH DIFFERENTIAL/PLATELET - Abnormal; Notable for the following:       Result Value   RBC 4.09 (*)    Hemoglobin 11.6 (*)    HCT 36.3 (*)    Monocytes Absolute 1.2 (*)    All other components within normal limits  COMPREHENSIVE METABOLIC PANEL - Abnormal; Notable for the following:    Sodium 134 (*)    Potassium 5.2 (*)    Glucose, Bld 130 (*)    Calcium 8.7 (*)    Albumin 3.0 (*)    Total Bilirubin 2.1 (*)    GFR calc non Af Amer 52 (*)    GFR calc Af Amer 60 (*)    All other components within normal limits  HEMOGLOBIN A1C - Abnormal; Notable for the following:  Hgb A1c MFr Bld 6.8 (*)    All other components within normal limits  LIPID PANEL - Abnormal; Notable for the following:    HDL 29 (*)    All other components within normal limits  BASIC METABOLIC PANEL - Abnormal; Notable for the following:    Glucose, Bld 119 (*)    GFR calc non Af Amer 51 (*)    GFR calc Af Amer 59 (*)    All other components within normal limits  GLUCOSE, CAPILLARY - Abnormal; Notable for the following:    Glucose-Capillary 114 (*)    All other components within normal limits  URINALYSIS, ROUTINE W REFLEX MICROSCOPIC  I-STAT TROPOININ, ED    EKG  EKG Interpretation  Date/Time:  Wednesday Dec 13 2016 17:45:03 EDT Ventricular Rate:  83 PR Interval:    QRS Duration: 108 QT Interval:  424 QTC Calculation: 498 R Axis:     Text Interpretation:  Atrial fibrillation Incomplete right bundle branch block Prolonged QT Abnormal ECG Interpretation limited secondary to artifact Confirmed by Ripley Fraise [phone]) on 12/14/2016 9:42:05 AM       Radiology Dg Chest 2 View  Result Date: 12/13/2016 CLINICAL  DATA:  Shortness of breath. Known left-sided malignant mesothelioma EXAM: CHEST  2 VIEW COMPARISON:  12/04/2016 FINDINGS: Cardiac shadow is within normal limits. Opacification of the left hemithorax is again seen stable from the prior exam. Right lung remains clear. No acute bony abnormality is seen. IMPRESSION: Opacification of the left hemithorax secondary to the patient's known mesothelioma. This is stable from the previous exam. Electronically Signed   By: Inez Catalina M.D.   On: 12/13/2016 19:09   Mr Brain Wo Contrast  Result Date: 12/13/2016 CLINICAL DATA:  83 y/o  M; dizziness, lightheadedness, and nausea. EXAM: MRI HEAD WITHOUT CONTRAST TECHNIQUE: Multiplanar, multiecho pulse sequences of the brain and surrounding structures were obtained without intravenous contrast. COMPARISON:  10/28/2013 CT head FINDINGS: Brain: Foci of encephalomalacia are present within the left-greater-than-right occipital lobes compatible with chronic infarction. There are small foci of reduced diffusion within the regions of encephalomalacia compatible with acute/ early subacute ischemia which may represent ongoing or recrudescence of infarction. Background of advanced chronic microvascular ischemic changes and parenchymal volume loss of the brain. Small cortical chronic infarction in the left frontal and left parietal lobes. No significant abnormal susceptibility hypointensity to indicate acute intracranial hemorrhage. No hydrocephalus, focal mass effect, or extra-axial collection. Vascular: Persistent central flow voids. Skull and upper cervical spine: Normal marrow signal. Sinuses/Orbits: No significant abnormal signal of the paranasal sinuses. Small bilateral mastoid air cell effusions. Bilateral intra-ocular lens replacement. Other: None. IMPRESSION: 1. Bilateral occipital encephalomalacia, likely from chronic infarction. Punctate foci of reduced diffusion within encephalomalacia may represent ongoing or recrudescence of  infarction. 2. No evidence for additional acute/early subacute infarct, intracranial hemorrhage, or significant mass effect. 3. Advanced chronic microvascular ischemic changes and parenchymal volume loss of the brain. 4. Small bilateral mastoid effusions. Electronically Signed   By: Kristine Garbe M.D.   On: 12/13/2016 20:15   Mr Jodene Nam Head/brain TF Cm  Result Date: 12/14/2016 CLINICAL DATA:  83 year old male with dizziness. EXAM: MRA HEAD WITHOUT CONTRAST TECHNIQUE: Angiographic images of the Circle of Willis were obtained using MRA technique without intravenous contrast. COMPARISON:  Brain MRI 12/13/2016.  Head CT 10/28/2013 FINDINGS: Antegrade flow in the posterior circulation but severe irregularity and multifocal high-grade stenosis in the dominant distal right vertebral artery (series 305, image 9). The right PICA origin remains patent.  The non dominant appearing distal left vertebral artery is less irregular and terminates in PICA. Similar moderate to severe basilar artery irregularity, and mid basilar stenosis which is at least moderate (same image). Distal basilar remains patent as do the bilateral SCA and PCA origins. Moderate to severe bilateral PCA irregularity and stenosis, with distal flow more attenuated on the left. Posterior communicating arteries are diminutive or absent. Antegrade flow in both ICA siphons. Tortuous distal cervical right ICA. Moderate to severe regularity and severe stenosis of the right ICA cavernous segment (series 303, image 10). A laterally directed 5 mm lesion from the distal cavernous segment is probably an atherosclerotic pseudo lesion rather than an aneurysm. Moderate irregularity but no significant stenosis of the left ICA siphon. Both ophthalmic artery origins remain patent. Moderate irregularity also of the supraclinoid right ICA. Both carotid termini remain patent. Mild to moderate irregularity at the bilateral MCA and ACA origins. Moderate irregularity and  stenosis of the right A1 segment. Anterior communicating artery and visible ACA branches are within normal limits. Moderate irregularity and mild bilateral MCA M1 segment stenosis. Both MCA bifurcations remain patent. No MCA branch occlusion is identified. There is moderate stenosis at the right MCA bifurcation. IMPRESSION: 1. Negative for large vessel occlusion but positive for severe intracranial atherosclerosis. 2. Severe stenoses of the distal Right Vertebral Artery, Right ICA siphon, and bilateral PCAs (note chronic bilateral PCA infarcts). 3. Moderate stenoses of the mid Basilar artery, Right ACA A1 segment, and Right MCA bifurcation. Electronically Signed   By: Genevie Ann M.D.   On: 12/14/2016 11:45    Procedures Procedures (including critical care time)  Medications Ordered in ED Medications  cloNIDine (CATAPRES) tablet 0.1 mg (0.1 mg Oral Given 12/15/16 0805)  rosuvastatin (CRESTOR) tablet 10 mg (10 mg Oral Given 12/14/16 1713)  acetaminophen (TYLENOL) tablet 650 mg (not administered)    Or  acetaminophen (TYLENOL) solution 650 mg (not administered)    Or  acetaminophen (TYLENOL) suppository 650 mg (not administered)  dabigatran (PRADAXA) capsule 75 mg (75 mg Oral Given 12/15/16 0805)  amLODipine (NORVASC) tablet 10 mg (not administered)  meclizine (ANTIVERT) tablet 25 mg (not administered)  insulin aspart (novoLOG) injection 0-9 Units (not administered)  insulin aspart (novoLOG) injection 0-5 Units (not administered)   stroke: mapping our early stages of recovery book ( Does not apply Given 12/13/16 2334)  sodium chloride 0.9 % bolus 250 mL (250 mLs Intravenous New Bag/Given 12/14/16 2020)  metoCLOPramide (REGLAN) injection 10 mg (10 mg Intravenous Given 12/15/16 0955)     Initial Impression / Assessment and Plan / ED Course  I have reviewed the triage vital signs and the nursing notes.  Pertinent labs & imaging results that were available during my care of the patient were reviewed by me and  considered in my medical decision making (see chart for details).    MR with evidence of acute stroke. Discussed with neurologist and recommended inpatient workup. Hospitalist will admit.   Final Clinical Impressions(s) / ED Diagnoses   Final diagnoses:  Acute cerebral infarction [facility])  Vertigo    New Prescriptions Current Discharge Medication List       Julianne Rice, MD 12/15/16 1245

## 2016-12-13 NOTE — ED Triage Notes (Signed)
Pt here from home with c/o dizziness times 2 times today , cbg 145 by ems , pt has no complaints of pain

## 2016-12-13 NOTE — Consult Note (Addendum)
Referring Physician: Dr. Lita Mains    Chief Complaint: Small right occipital lobe acute stroke seen on MRI brain  HPI: Thomas [name] is an 83 y.o. male who presented for evaluation of 2 episodes of acute intense vertigo at home. Each episode lasted about 3 minutes and was associated with nausea. There was no incoordination or weakness per patient, but his description of difficulty ambulating to a chair is suggestive of gait ataxia versus gait unsteadiness secondary to vertigo. He has had no prior episodes of vertigo of this intensity, but states that he has been dizzy in the past. No prior history of stroke or MI. He denies headache, neck pain, chest pain, abdominal pain or limb pain. Had some visual blurring during the spells, but no visual loss. Denies confusion or difficulty speaking. His home meds include rosuvastatin and Eliquis. In the ED, he was found to be in atrial fibrillation. He has a history of atrial fibrillation per an old office visit note.    MRI was obtained, revealing a cluster of subcentimeter foci of reduced diffusion within the medial right occipital lobe, consistent with acute infarction. This finding was seen on a background of bilateral occipital encephalomalacia, likely from chronic infarction. Also seen were advanced chronic microvascular ischemic changes and parenchymal volume loss of the brain.   Neurology was consulted to further evaluate.   Past Medical History:  Diagnosis Date  . Adenomatous colon polyp 2009  . Allergy   . Carotid artery occlusion   . Diverticulosis of colon (without mention of hemorrhage)   . ED (erectile dysfunction)   . Esophageal stricture   . Esophagitis   . GERD (gastroesophageal reflux disease)   . Hiatal hernia   . Hypertension   . Prostate cancer ([place])   . Unspecified gastritis and gastroduodenitis without mention of hemorrhage     Past Surgical History:  Procedure Laterality Date  . ENDARTERECTOMY Left 03/05/2013   Procedure:  ENDARTERECTOMY CAROTID;  Surgeon: Conrad Aitkin, MD;  Location: [facility];  Service: Vascular;  Laterality: Left;  . ESOPHAGOGASTRODUODENOSCOPY (EGD) WITH ESOPHAGEAL DILATION    . PROSTATE SURGERY     Laser surgery    Family History  Problem Relation Age of Onset  . Heart disease Father     unclear specifics  . Cancer Mother   . Breast cancer Sister   . Hyperlipidemia Daughter   . Hypertension Daughter   . Hypertension Son    Social History:  reports that he has never smoked. He has never used smokeless tobacco. He reports that he drinks alcohol. He reports that he does not use drugs.  Allergies:  Allergies  Allergen Reactions  . Ace Inhibitors Hives  . Penicillins Swelling    Swelling  of the face. Has patient had a PCN reaction causing immediate rash, facial/tongue/throat swelling, SOB or lightheadedness with hypotension: YES Has patient had a PCN reaction causing severe rash involving mucus membranes or skin necrosis: NO Has patient had a PCN reaction that required hospitalizationNO Has patient had a PCN reaction occurring within the last 10 years: NO If all of the above answers are "NO", then may proceed with Cephalosporin use.    Medications:  Prior to Admission:  Prescriptions Prior to Admission  Medication Sig Dispense Refill Last Dose  . amLODipine (NORVASC) 5 MG tablet Take 1 tablet (5 mg total) by mouth daily. 30 tablet 3 12/13/2016 at Unknown time  . cloNIDine (CATAPRES) 0.1 MG tablet TAKE ONE TABLET BY MOUTH THREE TIMES DAILY 90 tablet  4 12/13/2016 at Unknown time  . ELIQUIS 5 MG TABS tablet Take 1 tablet by mouth 2 (two) times daily.  1 12/13/2016 at 0800  . rosuvastatin (CRESTOR) 10 MG tablet Take 1 tablet (10 mg total) by mouth daily. 92 tablet 3 12/12/2016 at Unknown time   ROS: As per HPI.  Physical Examination: Blood pressure (!) 157/80, pulse 76, temperature 97.5 F (36.4 C), temperature source Oral, resp. rate 16, SpO2 98 %.  HEENT: [place]/AT Lungs: Respirations  unlabored Ext: Warm and well-perfused  Neurologic Examination: Mental Status: Alert, oriented, thought content appropriate. Attentive with good eye contact. Speech fluent. No dysarthria. Some difficulty with naming (index and little fingers). Repetition intact. Had some difficulty with a directional 2-step command, otherwise able to follow all commands.  Cranial Nerves: II:  Visual fields grossly intact without simultanagnosia, PERRL III,IV, VI: ptosis not present, EOM are full with mild saccadic quality noted. No nystagmus V,VII: smile symmetric, facial temp sensation normal bilaterally VIII: hearing intact to voice IX,X: no hypophonia XI: no asymmetry XII: midline tongue extension  Motor: Right : Upper extremity   5/5    Left:     Upper extremity   5/5  Lower extremity   5/5     Lower extremity   5/5 Normal tone throughout; no atrophy noted Sensory: Temperature and light touch intact in all 4 limbs tested individually. Right sided extinction is noted.  Deep Tendon Reflexes:  2+ bilateral upper and lower extremities.  Cerebellar: No ataxia with FNF or heel-shin bilaterally Gait: Deferred   Results for orders placed or performed during the hospital encounter of 12/13/16 (from the past 48 hour(s))  Urinalysis, Routine w reflex microscopic     Status: None   Collection Time: 12/13/16  6:37 PM  Result Value Ref Range   Color, Urine YELLOW YELLOW   APPearance CLEAR CLEAR   Specific Gravity, Urine 1.008 1.005 - 1.030   pH 7.0 5.0 - 8.0   Glucose, UA NEGATIVE NEGATIVE mg/dL   Hgb urine dipstick NEGATIVE NEGATIVE   Bilirubin Urine NEGATIVE NEGATIVE   Ketones, ur NEGATIVE NEGATIVE mg/dL   Protein, ur NEGATIVE NEGATIVE mg/dL   Nitrite NEGATIVE NEGATIVE   Leukocytes, UA NEGATIVE NEGATIVE  CBC with Differential/Platelet     Status: Abnormal   Collection Time: 12/13/16  8:36 PM  Result Value Ref Range   WBC 5.6 4.0 - 10.5 K/uL   RBC 4.09 (L) 4.22 - 5.81 MIL/uL   Hemoglobin 11.6 (L)  13.0 - 17.0 g/dL   HCT 36.3 (L) 39.0 - 52.0 %   MCV 88.8 78.0 - 100.0 fL   MCH 28.4 26.0 - 34.0 pg   MCHC 32.0 30.0 - 36.0 g/dL   RDW 15.1 11.5 - 15.5 %   Platelets 250 150 - 400 K/uL   Neutrophils Relative % 63 %   Neutro Abs 3.5 1.7 - 7.7 K/uL   Lymphocytes Relative 13 %   Lymphs Abs 0.7 0.7 - 4.0 K/uL   Monocytes Relative 21 %   Monocytes Absolute 1.2 (H) 0.1 - 1.0 K/uL   Eosinophils Relative 3 %   Eosinophils Absolute 0.2 0.0 - 0.7 K/uL   Basophils Relative 0 %   Basophils Absolute 0.0 0.0 - 0.1 K/uL  Comprehensive metabolic panel     Status: Abnormal   Collection Time: 12/13/16  8:36 PM  Result Value Ref Range   Sodium 134 (L) 135 - 145 mmol/L   Potassium 5.2 (H) 3.5 - 5.1 mmol/L  Comment: HEMOLYSIS AT THIS LEVEL MAY AFFECT RESULT   Chloride 101 101 - 111 mmol/L   CO2 23 22 - 32 mmol/L   Glucose, Bld 130 (H) 65 - 99 mg/dL   BUN 13 6 - 20 mg/dL   Creatinine, Ser 1.20 0.61 - 1.24 mg/dL   Calcium 8.7 (L) 8.9 - 10.3 mg/dL   Total Protein 7.2 6.5 - 8.1 g/dL   Albumin 3.0 (L) 3.5 - 5.0 g/dL   AST 33 15 - 41 U/L   ALT 21 17 - 63 U/L   Alkaline Phosphatase 77 38 - 126 U/L   Total Bilirubin 2.1 (H) 0.3 - 1.2 mg/dL   GFR calc non Af Amer 52 (L) >60 mL/min   GFR calc Af Amer 60 (L) >60 mL/min    Comment: (NOTE) The eGFR has been calculated using the CKD EPI equation. This calculation has not been validated in all clinical situations. eGFR's persistently <60 mL/min signify possible Chronic Kidney Disease.    Anion gap 10 5 - 15  I-stat troponin, ED     Status: None   Collection Time: 12/13/16  8:52 PM  Result Value Ref Range   Troponin i, poc 0.01 0.00 - 0.08 ng/mL   Comment 3            Comment: Due to the release kinetics of cTnI, a negative result within the first hours of the onset of symptoms does not rule out myocardial infarction with certainty. If myocardial infarction is still suspected, repeat the test at appropriate intervals.    Dg Chest 2  View  Result Date: 12/13/2016 CLINICAL DATA:  Shortness of breath. Known left-sided malignant mesothelioma EXAM: CHEST  2 VIEW COMPARISON:  12/04/2016 FINDINGS: Cardiac shadow is within normal limits. Opacification of the left hemithorax is again seen stable from the prior exam. Right lung remains clear. No acute bony abnormality is seen. IMPRESSION: Opacification of the left hemithorax secondary to the patient's known mesothelioma. This is stable from the previous exam. Electronically Signed   By: Inez Catalina M.D.   On: 12/13/2016 19:09   Mr Brain Wo Contrast  Result Date: 12/13/2016 CLINICAL DATA:  83 y/o  M; dizziness, lightheadedness, and nausea. EXAM: MRI HEAD WITHOUT CONTRAST TECHNIQUE: Multiplanar, multiecho pulse sequences of the brain and surrounding structures were obtained without intravenous contrast. COMPARISON:  10/28/2013 CT head FINDINGS: Brain: Foci of encephalomalacia are present within the left-greater-than-right occipital lobes compatible with chronic infarction. There are small foci of reduced diffusion within the regions of encephalomalacia compatible with acute/ early subacute ischemia which may represent ongoing or recrudescence of infarction. Background of advanced chronic microvascular ischemic changes and parenchymal volume loss of the brain. Small cortical chronic infarction in the left frontal and left parietal lobes. No significant abnormal susceptibility hypointensity to indicate acute intracranial hemorrhage. No hydrocephalus, focal mass effect, or extra-axial collection. Vascular: Persistent central flow voids. Skull and upper cervical spine: Normal marrow signal. Sinuses/Orbits: No significant abnormal signal of the paranasal sinuses. Small bilateral mastoid air cell effusions. Bilateral intra-ocular lens replacement. Other: None. IMPRESSION: 1. Bilateral occipital encephalomalacia, likely from chronic infarction. Punctate foci of reduced diffusion within encephalomalacia may  represent ongoing or recrudescence of infarction. 2. No evidence for additional acute/early subacute infarct, intracranial hemorrhage, or significant mass effect. 3. Advanced chronic microvascular ischemic changes and parenchymal volume loss of the brain. 4. Small bilateral mastoid effusions. Electronically Signed   By: Kristine Garbe M.D.   On: 12/13/2016 20:15    Assessment: 83  y.o. male presenting after two spells of acute vertigo at home 1. MRI revealed a cluster of subcentimeter foci of reduced diffusion within the medial right occipital lobe, consistent with acute infarction.  2. Most likely the vertigo was secondary to posterior fossa TIA occurring in conjunction with the occipital lobe stroke.  3. History of CEA.  4. Atrial fibrillation on Eliquis 5. Stroke Risk Factors - atrial fibrillation, history of carotid artery occlusion and HTN  Plan: 1. HgbA1c, fasting lipid panel 2. MRA of the brain without contrast 3. PT consult, OT consult, Speech consult 4. Echocardiogram 5. Carotid dopplers 6. Continue Eliquis. If an invasive procedure is to be performed, can hold Eliquis and cover with heparin. After procedure, restart Eliquis.  7. Continue Crestor 8. Modified permissive HTN x 24 hours: SBP goal < 180 given advanced age 25. Telemetry monitoring 10. Frequent neuro checks  '@Electronically'  signed: Dr. Kerney Elbe  12/13/2016, 9:27 PM

## 2016-12-13 NOTE — Telephone Encounter (Signed)
Spoke with patient's daughter Ivin Booty. She verbalized understanding. Nothing further needed.

## 2016-12-13 NOTE — H&P (Signed)
History and Physical    Thomas [name] OMV:[id number] DOB: [birth year] DOA: 12/13/2016  PCP: Garret Reddish, MD  Patient coming from: Home.  Chief Complaint: Dizziness.  HPI: Thomas [name] is a 83 y.o. male with history of paroxysmal atrial fibrillation, recently diagnosed mesothelioma with recurrent pleural effusion, hypertension, hyperlipidemia was brought to the ER after patient had persistent dizziness since morning. Patient's symptoms started this morning after waking up at around 7 AM. Patient states he almost felt while trying to walk. Denies any headache difficulty speaking or swallowing or any loss of function of the extremities. Since symptoms of things moving around assisted patient decided to come to the ER around 4 PM. Patient has had recent left pleural biopsy for recurrent pleural effusion and during which patient's Apixaban was held for 3 days.  ED Course: MRI of the brain shows which is concerning for stroke in the occipital area. On exam patient is able to move all extremities without difficulty. On call neurologist Dr. Cheral Marker has been consulted. Patient admitted for further stroke workup.  Review of Systems: As per HPI, rest all negative.   Past Medical History:  Diagnosis Date  . Adenomatous colon polyp 2009  . Allergy   . Carotid artery occlusion   . Diverticulosis of colon (without mention of hemorrhage)   . ED (erectile dysfunction)   . Esophageal stricture   . Esophagitis   . GERD (gastroesophageal reflux disease)   . Hiatal hernia   . Hypertension   . Prostate cancer ([place])   . Unspecified gastritis and gastroduodenitis without mention of hemorrhage     Past Surgical History:  Procedure Laterality Date  . ENDARTERECTOMY Left 03/05/2013   Procedure: ENDARTERECTOMY CAROTID;  Surgeon: Conrad [place]es [place], MD;  Location: [facility];  Service: Vascular;  Laterality: Left;  . ESOPHAGOGASTRODUODENOSCOPY (EGD) WITH ESOPHAGEAL DILATION    . PROSTATE SURGERY     Laser  surgery     reports that he has never smoked. He has never used smokeless tobacco. He reports that he drinks alcohol. He reports that he does not use drugs.  Allergies  Allergen Reactions  . Ace Inhibitors Hives  . Penicillins Swelling    Swelling  of the face. Has patient had a PCN reaction causing immediate rash, facial/tongue/throat swelling, SOB or lightheadedness with hypotension: YES Has patient had a PCN reaction causing severe rash involving mucus membranes or skin necrosis: NO Has patient had a PCN reaction that required hospitalizationNO Has patient had a PCN reaction occurring within the last 10 years: NO If all of the above answers are "NO", then may proceed with Cephalosporin use.    Family History  Problem Relation Age of Onset  . Heart disease Father     unclear specifics  . Cancer Mother   . Breast cancer Sister   . Hyperlipidemia Daughter   . Hypertension Daughter   . Hypertension Son     Prior to Admission medications   Medication Sig Start Date End Date Taking? Authorizing Provider  amLODipine (NORVASC) 5 MG tablet Take 1 tablet (5 mg total) by mouth daily. 10/16/16  Yes Almyra Deforest, PA  cloNIDine (CATAPRES) 0.1 MG tablet TAKE ONE TABLET BY MOUTH THREE TIMES DAILY 09/18/16  Yes Dorena Cookey, MD  ELIQUIS 5 MG TABS tablet Take 1 tablet by mouth 2 (two) times daily. 11/12/16  Yes Historical Provider, MD  rosuvastatin (CRESTOR) 10 MG tablet Take 1 tablet (10 mg total) by mouth daily. 05/29/16  Yes Marin Olp, MD  Physical Exam: Vitals:   12/13/16 2115 12/13/16 2145 12/13/16 2149 12/13/16 2251  BP: (!) 153/91 (!) 168/92  (!) 185/96  Pulse: 70 64  (!) 59  Resp: (!) 25 (!) 21  18  Temp:   97.8 F (36.6 C) 97.9 F (36.6 C)  TempSrc:    Oral  SpO2: 99% 98%  97%  Weight:    74.1 kg (163 lb 5.8 oz)  Height:    5\' 11"  (1.803 m)      Constitutional: Moderately built and nourished. Vitals:   12/13/16 2115 12/13/16 2145 12/13/16 2149 12/13/16 2251  BP: (!)  153/91 (!) 168/92  (!) 185/96  Pulse: 70 64  (!) 59  Resp: (!) 25 (!) 21  18  Temp:   97.8 F (36.6 C) 97.9 F (36.6 C)  TempSrc:    Oral  SpO2: 99% 98%  97%  Weight:    74.1 kg (163 lb 5.8 oz)  Height:    5\' 11"  (1.803 m)   Eyes: Anicteric no pallor. ENMT: No discharge from the ears eyes nose or mouth. Neck: No mass felt. No neck rigidity. Respiratory: No rhonchi or crepitations. Cardiovascular: S1 and S2 heard no murmurs appreciated. Abdomen: Soft nontender bowel sounds present. Musculoskeletal: No edema. No joint effusion. Skin: No rash. Her skin appears warm. Neurologic: Alert awake oriented to time place and person. Moves all extremities 5 x 5. No facial asymmetry time is midline. Pupils are equal and reacting. Psychiatric: Appears normal. Normal affect.   Labs on Admission: I have personally reviewed following labs and imaging studies  CBC:  Recent Labs Lab 12/13/16 2036  WBC 5.6  NEUTROABS 3.5  HGB 11.6*  HCT 36.3*  MCV 88.8  PLT 607   Basic Metabolic Panel:  Recent Labs Lab 12/13/16 2036  NA 134*  K 5.2*  CL 101  CO2 23  GLUCOSE 130*  BUN 13  CREATININE 1.20  CALCIUM 8.7*   GFR: Estimated Creatinine Clearance: 43.7 mL/min (by C-G formula based on SCr of 1.2 mg/dL). Liver Function Tests:  Recent Labs Lab 12/13/16 2036  AST 33  ALT 21  ALKPHOS 77  BILITOT 2.1*  PROT 7.2  ALBUMIN 3.0*   No results for input(s): LIPASE, AMYLASE in the last 168 hours. No results for input(s): AMMONIA in the last 168 hours. Coagulation Profile: No results for input(s): INR, PROTIME in the last 168 hours. Cardiac Enzymes: No results for input(s): CKTOTAL, CKMB, CKMBINDEX, TROPONINI in the last 168 hours. BNP (last 3 results)  Recent Labs  02/16/16 1003  PROBNP 69.0   HbA1C: No results for input(s): HGBA1C in the last 72 hours. CBG: No results for input(s): GLUCAP in the last 168 hours. Lipid Profile: No results for input(s): CHOL, HDL, LDLCALC, TRIG,  CHOLHDL, LDLDIRECT in the last 72 hours. Thyroid Function Tests: No results for input(s): TSH, T4TOTAL, FREET4, T3FREE, THYROIDAB in the last 72 hours. Anemia Panel: No results for input(s): VITAMINB12, FOLATE, FERRITIN, TIBC, IRON, RETICCTPCT in the last 72 hours. Urine analysis:    Component Value Date/Time   COLORURINE YELLOW 12/13/2016 1837   APPEARANCEUR CLEAR 12/13/2016 1837   LABSPEC 1.008 12/13/2016 1837   PHURINE 7.0 12/13/2016 1837   GLUCOSEU NEGATIVE 12/13/2016 1837   HGBUR NEGATIVE 12/13/2016 1837   HGBUR negative 04/20/2010 0000   BILIRUBINUR NEGATIVE 12/13/2016 1837   BILIRUBINUR n 05/18/2015 1123   KETONESUR NEGATIVE 12/13/2016 1837   PROTEINUR NEGATIVE 12/13/2016 1837   UROBILINOGEN 0.2 05/18/2015 1123   UROBILINOGEN 1.0  07/21/2013 1825   NITRITE NEGATIVE 12/13/2016 1837   LEUKOCYTESUR NEGATIVE 12/13/2016 1837   Sepsis Labs: @LABRCNTIP (procalcitonin:4,lacticidven:4) )No results found for this or any previous visit (from the past 240 hour(s)).   Radiological Exams on Admission: Dg Chest 2 View  Result Date: 12/13/2016 CLINICAL DATA:  Shortness of breath. Known left-sided malignant mesothelioma EXAM: CHEST  2 VIEW COMPARISON:  12/04/2016 FINDINGS: Cardiac shadow is within normal limits. Opacification of the left hemithorax is again seen stable from the prior exam. Right lung remains clear. No acute bony abnormality is seen. IMPRESSION: Opacification of the left hemithorax secondary to the patient's known mesothelioma. This is stable from the previous exam. Electronically Signed   By: Inez Catalina M.D.   On: 12/13/2016 19:09   Mr Brain Wo Contrast  Result Date: 12/13/2016 CLINICAL DATA:  83 y/o  M; dizziness, lightheadedness, and nausea. EXAM: MRI HEAD WITHOUT CONTRAST TECHNIQUE: Multiplanar, multiecho pulse sequences of the brain and surrounding structures were obtained without intravenous contrast. COMPARISON:  10/28/2013 CT head FINDINGS: Brain: Foci of  encephalomalacia are present within the left-greater-than-right occipital lobes compatible with chronic infarction. There are small foci of reduced diffusion within the regions of encephalomalacia compatible with acute/ early subacute ischemia which may represent ongoing or recrudescence of infarction. Background of advanced chronic microvascular ischemic changes and parenchymal volume loss of the brain. Small cortical chronic infarction in the left frontal and left parietal lobes. No significant abnormal susceptibility hypointensity to indicate acute intracranial hemorrhage. No hydrocephalus, focal mass effect, or extra-axial collection. Vascular: Persistent central flow voids. Skull and upper cervical spine: Normal marrow signal. Sinuses/Orbits: No significant abnormal signal of the paranasal sinuses. Small bilateral mastoid air cell effusions. Bilateral intra-ocular lens replacement. Other: None. IMPRESSION: 1. Bilateral occipital encephalomalacia, likely from chronic infarction. Punctate foci of reduced diffusion within encephalomalacia may represent ongoing or recrudescence of infarction. 2. No evidence for additional acute/early subacute infarct, intracranial hemorrhage, or significant mass effect. 3. Advanced chronic microvascular ischemic changes and parenchymal volume loss of the brain. 4. Small bilateral mastoid effusions. Electronically Signed   By: Kristine Garbe M.D.   On: 12/13/2016 20:15     Assessment/Plan Active Problems:   Essential hypertension   Malignant mesothelioma of pleura (HCC)   Permanent atrial fibrillation (HCC)   Stroke (cerebrum) (HCC)   Acute cerebral infarction ([place])    1. Stroke involving the occipital area - discussed with Dr. Cheral Marker, on-call neurologist. At this time patient is on Apixaban which will be continued. MRA of the brain 2-D echo carotid Doppler has been ordered. Check hemoglobin A1c and lipid panel. 2. Recently diagnosed mesothelioma with  recurrent left pleural effusion - chest x-ray shows complete opacification of the left lung. Discussed with Dr. Jenell Milliner, on-call pulmonologist. Since patient is not hypoxic and not in respiratory distress and x-ray shows stable findings pulmonologist advised to continue on Apixaban. 3. Paroxysmal atrial fibrillation - presently rate controlled. EKG is pending. Patient is on cement. Chads vasc score is more than 2. 4. Hypertension on clonidine and amlodipine. 5. Hyperlipidemia on statins. 6. Normocytic normochromic anemia - appears to be chronic.   DVT prophylaxis: Apixaban. Code Status: Full code.  Family Communication: Patient's son and daughter.  Disposition Plan: Home.  Consults called: Neurology.  Admission status: Observation.    Rise Patience MD [facility] Pager [phone].  If 7PM-7AM, please contact night-coverage [email or web address] Password [facility]  12/13/2016, 11:24 PM

## 2016-12-13 NOTE — Telephone Encounter (Signed)
Spoke with pt's daughter Ivin Booty (dpr on file), states pt c/o dizziness, lightheadedness, nausea today- has had several episodes of this today.  Pt has had 2 "near falls" per pt's daughter- pt fell to his knees and caught himself with his hands.   Denies fever, vomiting, chest pain, pain in extremities  Pt had vitals checked while I was on the phone, sp02- 95%, pulse- 74bpm, BP 170/92.   Pt uses [facility] on [address]. Pt requesting further recs.    RA please advise.  Thanks.

## 2016-12-14 ENCOUNTER — Observation Stay (HOSPITAL_COMMUNITY): Payer: Medicare Other

## 2016-12-14 ENCOUNTER — Other Ambulatory Visit: Payer: Self-pay | Admitting: *Deleted

## 2016-12-14 DIAGNOSIS — I482 Chronic atrial fibrillation: Secondary | ICD-10-CM | POA: Diagnosis present

## 2016-12-14 DIAGNOSIS — E1169 Type 2 diabetes mellitus with other specified complication: Secondary | ICD-10-CM | POA: Diagnosis not present

## 2016-12-14 DIAGNOSIS — I6340 Cerebral infarction due to embolism of unspecified cerebral artery: Secondary | ICD-10-CM | POA: Diagnosis present

## 2016-12-14 DIAGNOSIS — E875 Hyperkalemia: Secondary | ICD-10-CM | POA: Diagnosis present

## 2016-12-14 DIAGNOSIS — R42 Dizziness and giddiness: Secondary | ICD-10-CM | POA: Diagnosis not present

## 2016-12-14 DIAGNOSIS — I672 Cerebral atherosclerosis: Secondary | ICD-10-CM | POA: Diagnosis present

## 2016-12-14 DIAGNOSIS — C450 Mesothelioma of pleura: Secondary | ICD-10-CM | POA: Diagnosis present

## 2016-12-14 DIAGNOSIS — Z8546 Personal history of malignant neoplasm of prostate: Secondary | ICD-10-CM | POA: Diagnosis not present

## 2016-12-14 DIAGNOSIS — Z79899 Other long term (current) drug therapy: Secondary | ICD-10-CM | POA: Diagnosis not present

## 2016-12-14 DIAGNOSIS — I69398 Other sequelae of cerebral infarction: Secondary | ICD-10-CM | POA: Diagnosis not present

## 2016-12-14 DIAGNOSIS — Z7901 Long term (current) use of anticoagulants: Secondary | ICD-10-CM | POA: Diagnosis not present

## 2016-12-14 DIAGNOSIS — D649 Anemia, unspecified: Secondary | ICD-10-CM | POA: Diagnosis present

## 2016-12-14 DIAGNOSIS — E784 Other hyperlipidemia: Secondary | ICD-10-CM | POA: Diagnosis not present

## 2016-12-14 DIAGNOSIS — I639 Cerebral infarction, unspecified: Secondary | ICD-10-CM | POA: Diagnosis not present

## 2016-12-14 DIAGNOSIS — E785 Hyperlipidemia, unspecified: Secondary | ICD-10-CM | POA: Diagnosis present

## 2016-12-14 DIAGNOSIS — E1151 Type 2 diabetes mellitus with diabetic peripheral angiopathy without gangrene: Secondary | ICD-10-CM | POA: Diagnosis present

## 2016-12-14 DIAGNOSIS — I10 Essential (primary) hypertension: Secondary | ICD-10-CM | POA: Diagnosis present

## 2016-12-14 LAB — LIPID PANEL
CHOL/HDL RATIO: 4.4 ratio
Cholesterol: 128 mg/dL (ref 0–200)
HDL: 29 mg/dL — AB (ref >40)
LDL Cholesterol: 86 mg/dL (ref 0–99)
Triglycerides: 67 mg/dL (ref <150)
VLDL: 13 mg/dL (ref 0–40)

## 2016-12-14 MED ORDER — MECLIZINE HCL 25 MG PO TABS
25.0000 mg | ORAL_TABLET | Freq: Three times a day (TID) | ORAL | Status: DC | PRN
  Administered 2016-12-14 – 2016-12-15 (×2): 25 mg via ORAL
  Filled 2016-12-14 (×2): qty 1

## 2016-12-14 MED ORDER — DABIGATRAN ETEXILATE MESYLATE 150 MG PO CAPS: 150.0000 mg | ORAL_CAPSULE | Freq: Two times a day (BID) | ORAL | Status: DC

## 2016-12-14 MED ORDER — DABIGATRAN ETEXILATE MESYLATE 75 MG PO CAPS
75.0000 mg | ORAL_CAPSULE | Freq: Two times a day (BID) | ORAL | Status: DC
  Administered 2016-12-14 – 2016-12-16 (×4): 75 mg via ORAL
  Filled 2016-12-14 (×4): qty 1

## 2016-12-14 MED ORDER — SODIUM CHLORIDE 0.9 % IV BOLUS (SEPSIS)
250.0000 mL | Freq: Once | INTRAVENOUS | Status: AC
  Administered 2016-12-14: 250 mL via INTRAVENOUS

## 2016-12-14 NOTE — Progress Notes (Signed)
PROGRESS NOTE                                                                                                                                                                                                             Patient Demographics:    Thomas [name], is a 83 y.o. male, DOB - 10-Nov-1925, YNW:[id number]  Admit date - 12/13/2016   Admitting Physician Rise Patience, MD  Outpatient Primary MD for the patient is Garret Reddish, MD  LOS - 0  Outpatient Specialists: none  Chief Complaint  Patient presents with  . Dizziness       Brief Narrative   83 year old male with history of paroxysmal A. fib, recently diagnosed mesothelioma with recurrent pleural effusion, hypertension, hyperlipidemia presented to the ED with persistent dizziness since the morning of admission. Denied any headache, slurred speech, difficulty swallowing, weakness or numbness of his extremities. Denied any bowel or urinary symptoms. At baseline he is independent but uses cane frequently. Patient is on eliquis or his A. fib which was held for 3 days recently for his pleural biopsy. In the ED head CT was unremarkable while MRI of the brain showed possible stroke in the occipital area.    Subjective:   Patient denies any further dizziness or new weakness.   Assessment  & Plan :   Principal problem Acute occipital stroke Seen by stroke team and recommended switching eliquis to Pradaxa (started at a lower dose given his advanced age and her function). 2-D echo and carotid Doppler pending. Lipid panel shows LDL of 86. Continue statin. A1c pending. No further residual weakness. If echo and carotid Doppler unremarkable with discharge him in the morning with home health and follow-up with outpatient neurology.  Recently diagnosed malignant mesothelioma with recurrent left pleural effusion. Chest x-ray on admission shows complete opacification of  left lung. Hospitalist discussed findings with pulmonologist on call. Since patient is not in any respiratory distress and hypoxic with stable findings from prior imaging recommended no further intervention. Patient has appointment with Dr. Julien Nordmann on 5/10.  Paroxysmal A. fib On Eliquis , switched to pradaxa per stroke team.  Essential hypertension On clonidine and amlodipine. Allow permissive blood pressure.  Hyperlipidemia Continue statin.  Chronic Normocytic anemia Stable    Code Status : Full code  Family Communication  : Discussed with son at bedside  Disposition Plan  : Home in a.m. once workup completed  Barriers For Discharge : Pending carotid Doppler and 2-D echo  Consults  :  Stroke service  Procedures  :  CT head MRI brain   DVT Prophylaxis  :  Pradaxa  Lab Results  Component Value Date   PLT 250 12/13/2016    Antibiotics  :  Anti-infectives    None        Objective:   Vitals:   12/14/16 1100 12/14/16 1401 12/14/16 1517 12/14/16 1716  BP: (!) 172/106 136/83 (!) 153/85 (!) 160/89  Pulse: 67 (!) 58 60 73  Resp: 18   20  Temp: 98.3 F (36.8 C) 97.6 F (36.4 C)  97.6 F (36.4 C)  TempSrc: Oral Oral  Oral  SpO2: 99% 100% 97% 99%  Weight:      Height:        Wt Readings from Last 3 Encounters:  12/13/16 74.1 kg (163 lb 5.8 oz)  12/07/16 76.9 kg (169 lb 9.6 oz)  12/04/16 85.3 kg (188 lb)     Intake/Output Summary (Last 24 hours) at 12/14/16 1717 Last data filed at 12/14/16 1716  Gross per 24 hour  Intake              240 ml  Output                0 ml  Net              240 ml     Physical Exam  Gen: not in distress HEENT:  moist mucosa, supple neck Chest: clear b/l, no added sounds CVS: S1 and S2 irregular, no murmurs rub or gallop GI: soft, NT, ND,  Musculoskeletal: warm, no edema CNS: Alert and oriented, no focal deficit    Data Review:    CBC  Recent Labs Lab 12/13/16 2036  WBC 5.6  HGB 11.6*  HCT 36.3*  PLT  250  MCV 88.8  MCH 28.4  MCHC 32.0  RDW 15.1  LYMPHSABS 0.7  MONOABS 1.2*  EOSABS 0.2  BASOSABS 0.0    Chemistries   Recent Labs Lab 12/13/16 2036  NA 134*  K 5.2*  CL 101  CO2 23  GLUCOSE 130*  BUN 13  CREATININE 1.20  CALCIUM 8.7*  AST 33  ALT 21  ALKPHOS 77  BILITOT 2.1*   ------------------------------------------------------------------------------------------------------------------  Recent Labs  12/14/16 0246  CHOL 128  HDL 29*  LDLCALC 86  TRIG 67  CHOLHDL 4.4    Lab Results  Component Value Date   HGBA1C 6.7 (H) 10/12/2011   ------------------------------------------------------------------------------------------------------------------ No results for input(s): TSH, T4TOTAL, T3FREE, THYROIDAB in the last 72 hours.  Invalid input(s): FREET3 ------------------------------------------------------------------------------------------------------------------ No results for input(s): VITAMINB12, FOLATE, FERRITIN, TIBC, IRON, RETICCTPCT in the last 72 hours.  Coagulation profile No results for input(s): INR, PROTIME in the last 168 hours.  No results for input(s): DDIMER in the last 72 hours.  Cardiac Enzymes No results for input(s): CKMB, TROPONINI, MYOGLOBIN in the last 168 hours.  Invalid input(s): CK ------------------------------------------------------------------------------------------------------------------ No results found for: BNP  Inpatient Medications  Scheduled Meds: . amLODipine  5 mg Oral Daily  . cloNIDine  0.1 mg Oral TID  . dabigatran  75 mg Oral Q12H  . rosuvastatin  10 mg Oral q1800   Continuous Infusions: PRN Meds:.acetaminophen **OR** acetaminophen (TYLENOL) oral liquid 160 mg/5 mL **OR** acetaminophen  Micro Results No results found for this or any previous visit (from the past 240  hour(s)).  Radiology Reports Dg Chest 2 View  Result Date: 12/13/2016 CLINICAL DATA:  Shortness of breath. Known left-sided  malignant mesothelioma EXAM: CHEST  2 VIEW COMPARISON:  12/04/2016 FINDINGS: Cardiac shadow is within normal limits. Opacification of the left hemithorax is again seen stable from the prior exam. Right lung remains clear. No acute bony abnormality is seen. IMPRESSION: Opacification of the left hemithorax secondary to the patient's known mesothelioma. This is stable from the previous exam. Electronically Signed   By: Inez Catalina M.D.   On: 12/13/2016 19:09   Mr Brain Wo Contrast  Result Date: 12/13/2016 CLINICAL DATA:  83 y/o  M; dizziness, lightheadedness, and nausea. EXAM: MRI HEAD WITHOUT CONTRAST TECHNIQUE: Multiplanar, multiecho pulse sequences of the brain and surrounding structures were obtained without intravenous contrast. COMPARISON:  10/28/2013 CT head FINDINGS: Brain: Foci of encephalomalacia are present within the left-greater-than-right occipital lobes compatible with chronic infarction. There are small foci of reduced diffusion within the regions of encephalomalacia compatible with acute/ early subacute ischemia which may represent ongoing or recrudescence of infarction. Background of advanced chronic microvascular ischemic changes and parenchymal volume loss of the brain. Small cortical chronic infarction in the left frontal and left parietal lobes. No significant abnormal susceptibility hypointensity to indicate acute intracranial hemorrhage. No hydrocephalus, focal mass effect, or extra-axial collection. Vascular: Persistent central flow voids. Skull and upper cervical spine: Normal marrow signal. Sinuses/Orbits: No significant abnormal signal of the paranasal sinuses. Small bilateral mastoid air cell effusions. Bilateral intra-ocular lens replacement. Other: None. IMPRESSION: 1. Bilateral occipital encephalomalacia, likely from chronic infarction. Punctate foci of reduced diffusion within encephalomalacia may represent ongoing or recrudescence of infarction. 2. No evidence for additional  acute/early subacute infarct, intracranial hemorrhage, or significant mass effect. 3. Advanced chronic microvascular ischemic changes and parenchymal volume loss of the brain. 4. Small bilateral mastoid effusions. Electronically Signed   By: Kristine Garbe M.D.   On: 12/13/2016 20:15   Ct Biopsy  Result Date: 12/04/2016 INDICATION: 83 year old with diffuse left pleural thickening and large left pleural effusion. Tissue diagnosis is needed. EXAM: CT-GUIDED BIOPSY OF LEFT PLEURAL THICKENING MEDICATIONS: None. ANESTHESIA/SEDATION: Moderate (conscious) sedation was employed during this procedure. A total of Versed 1.0 mg and Fentanyl 50 mcg was administered intravenously. Moderate Sedation Time: 38 minutes. The patient's level of consciousness and vital signs were monitored continuously by radiology nursing throughout the procedure under my direct supervision. FLUOROSCOPY TIME:  None COMPLICATIONS: None immediate. PROCEDURE: Informed written consent was obtained from the patient after a thorough discussion of the procedural risks, benefits and alternatives. All questions were addressed. Maximal Sterile Barrier Technique was utilized including caps, mask, sterile gowns, sterile gloves, sterile drape, hand hygiene and skin antiseptic. A timeout was performed prior to the initiation of the procedure. Patient was initially placed prone and CT images through the chest were obtained. Patient was repositioned on his left side in order to make him more comfortable. Additional images through the left lower chest were obtained. Pleural thickening along the posteromedial left lower chest was targeted for biopsy. The back was prepped and draped in sterile fashion. Skin was anesthetized with 1% lidocaine. 17 gauge coaxial needle was directed into the pleural thickening with CT guidance. A total of 4 core biopsies were obtained with an 18 gauge core device. 17 gauge needle was removed without complication. Bandage  placed over the puncture site. FINDINGS: Diffuse pleural thickening most prominent in the left lower chest. Near complete collapse of the left lung with a large left  pleural effusion. Biopsy needle was placed in the pleural thickening in the posteromedial left lower chest. No evidence for pneumothorax following the procedure. IMPRESSION: Successful CT-guided core biopsies of the pleural thickening in the posteromedial left lower chest. Electronically Signed   By: Markus Daft M.D.   On: 12/04/2016 13:42   Mr Jodene Nam Head/brain LX Cm  Result Date: 12/14/2016 CLINICAL DATA:  83 year old male with dizziness. EXAM: MRA HEAD WITHOUT CONTRAST TECHNIQUE: Angiographic images of the Circle of Willis were obtained using MRA technique without intravenous contrast. COMPARISON:  Brain MRI 12/13/2016.  Head CT 10/28/2013 FINDINGS: Antegrade flow in the posterior circulation but severe irregularity and multifocal high-grade stenosis in the dominant distal right vertebral artery (series 305, image 9). The right PICA origin remains patent. The non dominant appearing distal left vertebral artery is less irregular and terminates in PICA. Similar moderate to severe basilar artery irregularity, and mid basilar stenosis which is at least moderate (same image). Distal basilar remains patent as do the bilateral SCA and PCA origins. Moderate to severe bilateral PCA irregularity and stenosis, with distal flow more attenuated on the left. Posterior communicating arteries are diminutive or absent. Antegrade flow in both ICA siphons. Tortuous distal cervical right ICA. Moderate to severe regularity and severe stenosis of the right ICA cavernous segment (series 303, image 10). A laterally directed 5 mm lesion from the distal cavernous segment is probably an atherosclerotic pseudo lesion rather than an aneurysm. Moderate irregularity but no significant stenosis of the left ICA siphon. Both ophthalmic artery origins remain patent. Moderate  irregularity also of the supraclinoid right ICA. Both carotid termini remain patent. Mild to moderate irregularity at the bilateral MCA and ACA origins. Moderate irregularity and stenosis of the right A1 segment. Anterior communicating artery and visible ACA branches are within normal limits. Moderate irregularity and mild bilateral MCA M1 segment stenosis. Both MCA bifurcations remain patent. No MCA branch occlusion is identified. There is moderate stenosis at the right MCA bifurcation. IMPRESSION: 1. Negative for large vessel occlusion but positive for severe intracranial atherosclerosis. 2. Severe stenoses of the distal Right Vertebral Artery, Right ICA siphon, and bilateral PCAs (note chronic bilateral PCA infarcts). 3. Moderate stenoses of the mid Basilar artery, Right ACA A1 segment, and Right MCA bifurcation. Electronically Signed   By: Genevie Ann M.D.   On: 12/14/2016 11:45    Time Spent in minutes  25   Louellen Molder M.D on 12/14/2016 at 5:17 PM  Between 7am to 7pm - Pager - [phone]  After 7pm go to [email or web address] - password [email or web address]  [facility] -  Office  [phone]

## 2016-12-14 NOTE — Evaluation (Signed)
Physical Therapy Evaluation Patient Details Name: Thomas [name] MRN: [id number] DOB: [birth year] Today's Date: 12/14/2016   History of Present Illness  Pt is a 83 y.o. male with history of paroxysmal atrial fibrillation, recently diagnosed mesothelioma with recurrent pleural effusion, hypertension, hyperlipidemia was brought to the ER on 12/13/16 after patient had persistent dizziness since morning and almost fell trying to walk. MRI was obtained, revealing a cluster of subcentimeter foci of reduced diffusion within the medial right occipital lobe, consistent with acute infarction.  Clinical Impression  Pt presented supine in bed with HOB elevated, awake and willing to participate in therapy session. Prior to admission, pt reported that he was independent with ADLs and occasionally uses a SPC to ambulate. Pt lives with wife and son and has assistance PRN. Pt ambulated in hallway with use of SPC and min guard with mild instability but no LOB. Pt would continue to benefit from skilled physical therapy services at this time while admitted and after d/c to address the below listed limitations in order to improve overall safety and independence with functional mobility.     Follow Up Recommendations Home health PT    Equipment Recommendations  None recommended by PT    Recommendations for Other Services       Precautions / Restrictions Precautions Precautions: Fall Restrictions Weight Bearing Restrictions: No      Mobility  Bed Mobility Overal bed mobility: Modified Independent                Transfers Overall transfer level: Needs assistance Equipment used: Straight cane Transfers: Sit to/from Stand Sit to Stand: Min guard         General transfer comment: guarding for safety  Ambulation/Gait Ambulation/Gait assistance: Min guard Ambulation Distance (Feet): 75 Feet Assistive device: Straight cane Gait Pattern/deviations: Step-to pattern;Step-through pattern;Decreased  step length - right;Decreased step length - left;Decreased stride length;Shuffle Gait velocity: decreased Gait velocity interpretation: Below normal speed for age/gender General Gait Details: mild instability but no LOB or need for physical assistance, min guard for safety  Stairs            Wheelchair Mobility    Modified Rankin (Stroke Patients Only) Modified Rankin (Stroke Patients Only) Pre-Morbid Rankin Score: No symptoms Modified Rankin: Moderately severe disability     Balance Overall balance assessment: Needs assistance Sitting-balance support: No upper extremity supported;Feet supported Sitting balance-Leahy Scale: Good     Standing balance support: Single extremity supported;During functional activity Standing balance-Leahy Scale: Poor Standing balance comment: pt reliant on at least one UE support                             Pertinent Vitals/Pain Pain Assessment: No/denies pain    Home Living Family/patient expects to be discharged to:: Private residence Living Arrangements: Spouse/significant other;Children Available Help at Discharge: Family;Available PRN/intermittently Type of Home: House Home Access: Stairs to enter Entrance Stairs-Rails: None Entrance Stairs-Number of Steps: 3 Home Layout: One level Home Equipment: Cane - single point;Bedside commode;Shower seat      Prior Function Level of Independence: Independent with assistive device(s)               Hand Dominance   Dominant Hand: Right    Extremity/Trunk Assessment   Upper Extremity Assessment Upper Extremity Assessment: Defer to OT evaluation    Lower Extremity Assessment Lower Extremity Assessment: Generalized weakness    Cervical / Trunk Assessment Cervical / Trunk Assessment: Other exceptions Cervical /  Trunk Exceptions: rounded shoulders and forward head  Communication   Communication: HOH  Cognition Arousal/Alertness: Awake/alert Behavior During  Therapy: WFL for tasks assessed/performed Overall Cognitive Status: Impaired/Different from baseline Area of Impairment: Memory                     Memory: Decreased short-term memory                General Comments      Exercises     Assessment/Plan    PT Assessment Patient needs continued PT services  PT Problem List Decreased balance;Decreased mobility;Decreased coordination;Decreased strength;Decreased knowledge of use of DME;Decreased safety awareness       PT Treatment Interventions DME instruction;Gait training;Stair training;Functional mobility training;Therapeutic activities;Therapeutic exercise;Balance training;Neuromuscular re-education;Patient/family education    PT Goals (Current goals can be found in the Care Plan section)  Acute Rehab PT Goals Patient Stated Goal: to get home PT Goal Formulation: With patient Time For Goal Achievement: 12/28/16 Potential to Achieve Goals: Good    Frequency Min 4X/week   Barriers to discharge        Co-evaluation               AM-PAC PT "6 Clicks" Daily Activity  Outcome Measure Difficulty turning over in bed (including adjusting bedclothes, sheets and blankets)?: None Difficulty moving from lying on back to sitting on the side of the bed? : None Difficulty sitting down on and standing up from a chair with arms (e.g., wheelchair, bedside commode, etc,.)?: A Little Help needed moving to and from a bed to chair (including a wheelchair)?: A Little Help needed walking in hospital room?: A Little Help needed climbing 3-5 steps with a railing? : A Lot 6 Click Score: 19    End of Session Equipment Utilized During Treatment: Gait belt Activity Tolerance: Patient tolerated treatment well Patient left: in bed;with call bell/phone within reach;with bed alarm set;with family/visitor present Nurse Communication: Mobility status PT Visit Diagnosis: Other abnormalities of gait and mobility (R26.89);Other symptoms  and signs involving the nervous system (R29.898)    Time: 0488-8916 PT Time Calculation (min) (ACUTE ONLY): 16 min   Charges:   PT Evaluation $PT Eval Moderate Complexity: 1 Procedure     PT G Codes:   PT G-Codes **NOT FOR INPATIENT CLASS** Functional Assessment Tool Used: AM-PAC 6 Clicks Basic Mobility;Clinical judgement Functional Limitation: Mobility: Walking and moving around Mobility: Walking and Moving Around Current Status (X4503): At least 20 percent but less than 40 percent impaired, limited or restricted Mobility: Walking and Moving Around Goal Status [phone]): 0 percent impaired, limited or restricted    [facility], PT, DPT [place] 12/14/2016, 4:32 PM

## 2016-12-14 NOTE — Progress Notes (Signed)
Pt out of the room for testing during his 9 am neuro and vital sign check.  Will continue to monitor.  Cori Razor, RN

## 2016-12-14 NOTE — Evaluation (Signed)
Occupational Therapy Evaluation Patient Details Name: Thomas [name] MRN: [id number] DOB: 11-Apr-1926 Today's Date: 12/14/2016    History of Present Illness Pt is a 83 y.o. male with history of paroxysmal atrial fibrillation, recently diagnosed mesothelioma with recurrent pleural effusion, hypertension, hyperlipidemia was brought to the ER on 12/13/16 after patient had persistent dizziness since morning and almost fell trying to walk. MRI was obtained, revealing a cluster of subcentimeter foci of reduced diffusion within the medial right occipital lobe, consistent with acute infarction.   Clinical Impression   Pt presented very pleasant supine in bed and willing to work with therapy. PTA Pt independent in ADL/IADL and for mobility with SPC. Pt currently at supervision level for ADL and min guard for mobility with SPC. OT evaluation cut short because Pt was being taken down for MRI. Pt will benefit from skilled OT in the acute setting to maximize safety and independence in ADL and will require OPOT to return to PLOF/address balance deficits and compensatory strategies for safety. Next session to work on tub transfer with shower chair (and doors).     Follow Up Recommendations  Outpatient OT;Supervision - Intermittent    Equipment Recommendations  None recommended by OT (Pt has appropriate DME)    Recommendations for Other Services PT consult     Precautions / Restrictions Precautions Precautions: Fall Restrictions Weight Bearing Restrictions: No      Mobility Bed Mobility Overal bed mobility: Modified Independent             General bed mobility comments: increased time, use of bed rail for in and out of bed  Transfers Overall transfer level: Needs assistance Equipment used: Straight cane;Rolling walker (2 wheeled) Transfers: Sit to/from Stand Sit to Stand: Min guard         General transfer comment: guarding for safety    Balance Overall balance assessment: Needs  assistance Sitting-balance support: No upper extremity supported;Feet supported Sitting balance-Leahy Scale: Good Sitting balance - Comments: sitting EOB with no back support   Standing balance support: Single extremity supported;During functional activity Standing balance-Leahy Scale: Fair Standing balance comment: reliant on SPC                           ADL either performed or assessed with clinical judgement   ADL Overall ADL's : At baseline                                       General ADL Comments: Able to don/doff socks in sitting, perform toilet transfer, sink level grooming at supervision level. Feel that there are balance deficits, no family around to determine true baseline     Vision Baseline Vision/History: Wears glasses Wears Glasses: Reading only Patient Visual Report: No change from baseline Vision Assessment?: Yes Eye Alignment: Within Functional Limits Ocular Range of Motion: Within Functional Limits Alignment/Gaze Preference: Within Defined Limits Tracking/Visual Pursuits: Able to track stimulus in all quads without difficulty Visual Fields: No apparent deficits Diplopia Assessment:  (denies) Depth Perception:  [facility]) Additional Comments: Pt states that vision is at baseline     Perception     Praxis      Pertinent Vitals/Pain Pain Assessment: No/denies pain     Hand Dominance Right   Extremity/Trunk Assessment Upper Extremity Assessment Upper Extremity Assessment: Generalized weakness;Overall [facility] for tasks assessed   Lower Extremity Assessment Lower Extremity Assessment: Defer  to PT evaluation   Cervical / Trunk Assessment Cervical / Trunk Assessment: Other exceptions Cervical / Trunk Exceptions: rounded shoulders and forward head   Communication Communication Communication: HOH   Cognition Arousal/Alertness: Awake/alert Behavior During Therapy: WFL for tasks assessed/performed Overall Cognitive Status: No  family/caregiver present to determine baseline cognitive functioning                                 General Comments: surface level Pt is conversational, able to tell you where he is, why he is here, and the symptoms that brought him in. He also repeated himself a lot during the session - and didn't remember that he had already told me some information. Pt has list of phone numbers in room (good compensatory strategy)   General Comments       Exercises     Shoulder Instructions      Home Living Family/patient expects to be discharged to:: Private residence Living Arrangements: Spouse/significant other;Children Available Help at Discharge: Family;Available PRN/intermittently Type of Home: House Home Access: Stairs to enter [employer] of Steps: 3 Entrance Stairs-Rails: None Home Layout: One level     Bathroom Shower/Tub: Tub/shower unit;Door   [employer] Toilet: Standard Bathroom Accessibility: Yes How Accessible: Accessible via walker Home Equipment: Cane - single point;Bedside commode;Shower seat          Prior Functioning/Environment Level of Independence: Independent with assistive device(s)        Comments: driving, independent in ADL/IADL uses SPC for ambulation        OT Problem List: Impaired balance (sitting and/or standing);Decreased safety awareness      OT Treatment/Interventions: Self-care/ADL training;Energy conservation;Therapeutic activities;Cognitive remediation/compensation;Visual/perceptual remediation/compensation;Patient/family education;Balance training    OT Goals(Current goals can be found in the care plan section) Acute Rehab OT Goals Patient Stated Goal: to get home OT Goal Formulation: With patient Time For Goal Achievement: 12/28/16 Potential to Achieve Goals: Good ADL Goals Pt Will Perform Upper Body Bathing: with modified independence;standing Pt Will Perform Lower Body Bathing: with modified independence;sit  to/from stand Pt Will Perform Tub/Shower Transfer: Tub transfer;with supervision;with caregiver independent in assisting;ambulating;shower seat Additional ADL Goal #1: Pt will recall 3 ways of using energy conservation techniques during ADL  OT Frequency: Min 2X/week   Barriers to D/C:            Co-evaluation              AM-PAC PT "6 Clicks" Daily Activity     Outcome Measure Help from another person eating meals?: None Help from another person taking care of personal grooming?: None Help from another person toileting, which includes using toliet, bedpan, or urinal?: None Help from another person bathing (including washing, rinsing, drying)?: A Little Help from another person to put on and taking off regular upper body clothing?: None Help from another person to put on and taking off regular lower body clothing?: A Little 6 Click Score: 22   End of Session Equipment Utilized During Treatment: Gait belt Nurse Communication: Mobility status;Precautions  Activity Tolerance: Patient tolerated treatment well Patient left: Other (comment) (going to MRI with transport)  OT Visit Diagnosis: Unsteadiness on feet (R26.81);Other symptoms and signs involving the nervous system (R29.898);Dizziness and giddiness (R42)                Time: 9528-4132 OT Time Calculation (min): 35 min Charges:  OT General Charges $OT Visit: 1 Procedure OT Evaluation $OT  Eval Low Complexity: 1 Procedure OT Treatments $Self Care/Home Management : 8-22 mins G-Codes: OT G-codes **NOT FOR INPATIENT CLASS** Functional Assessment Tool Used: AM-PAC 6 Clicks Daily Activity   Hulda Humphrey OTR/L [place] 12/14/2016, 9:40 AM

## 2016-12-14 NOTE — Care Management Obs Status (Signed)
[facility] NOTIFICATION   Patient Details  Name: Thomas [name] MRN: [id number] Date of Birth: 11-12-1925   Medicare Observation Status Notification Given:  Yes    Pollie Friar, RN 12/14/2016, 12:16 PM

## 2016-12-14 NOTE — Progress Notes (Addendum)
[facility] for Pradaxa Indication: atrial fibrillation  Assessment: 42 yom admitted with stroke. Previously on apixaban 5mg  PO BID for hx afib. Patient has had recent left pleural biopsy for recurrent pleural effusion and apixaban was held for 3 days, then resumed - last dose PTA 5/2. Pharmacy consulted to transition to Pradaxa. Last dose of apixaban given 5/3 at 1039. Hg 11.6, plt wnl. No bleeding issues documented.  Spoke with Dr. Leonie Man about use of Pradaxa being discouraged in patients with advanced age (83 yo patient) with renal impairment - confirmed he wants to to switch to Pradaxa. Pradaxa use is discouraged in patients with advanced age and recommended for use in patients >37 yo only with "extreme caution or consider using other treatment options" per package insert.  SCr 1.2 on admit, est CrCl~43 ml/min. Per dosing references, can consider dose reduction with mild to moderate renal impairment, particularly in advanced age. Dr. Willaim Rayas ok with dosage reduction.  Goal of Therapy:  Stroke prevention Monitor platelets by anticoagulation protocol: Yes   Plan:  Pradaxa 75mg  PO BID - to start tonight Monitor CBC, s/sx bleeding closely Discuss possible dosage reduction with Neuro   Elicia Lamp, PharmD, BCPS Clinical Pharmacist 12/14/2016 1:11 PM

## 2016-12-14 NOTE — Progress Notes (Signed)
Carotid duplex performed 11/27/16 within normal limits. Please advise if repeat carotid needed. Vascular lab [phone]   Landry Mellow, RDMS, RVT

## 2016-12-14 NOTE — Discharge Instructions (Addendum)
Information on my medicine - Pradaxa (dabigatran)  This medication education was reviewed with me or my healthcare representative as part of my discharge preparation.  Why was Pradaxa prescribed for you? Pradaxa was prescribed for you to reduce the risk of forming blood clots that cause a stroke if you have a medical condition called atrial fibrillation (a type of irregular heartbeat).    What do you Need to know about PradAXa? Take your Pradaxa TWICE DAILY - one capsule in the morning and one tablet in the evening with or without food.  It would be best to take the doses about the same time each day.  The capsules should not be broken, chewed or opened - they must be swallowed whole.  Do not store Pradaxa in other medication containers - once the bottle is opened the Pradaxa should be used within FOUR months; throw away any capsules that havent been by that time.  Take Pradaxa exactly as prescribed by your doctor.  DO NOT stop taking Pradaxa without talking to the doctor who prescribed the medication.  Stopping without other stroke prevention medication to take the place of Pradaxa may increase your risk of developing a clot that causes a stroke.  Refill your prescription before you run out.  After discharge, you should have regular check-up appointments with your healthcare provider that is prescribing your Pradaxa.  In the future your dose may need to be changed if your kidney function or weight changes by a significant amount.  What do you do if you miss a dose? If you miss a dose, take it as soon as you remember on the same day.  If your next dose is less than 6 hours away, skip the missed dose.  Do not take two doses of PRADAXA at the same time.  Important Safety Information A possible side effect of Pradaxa is bleeding. You should call your healthcare provider right away if you experience any of the following: ? Bleeding from an injury or your nose that does not  stop. ? Unusual colored urine (red or dark brown) or unusual colored stools (red or black). ? Unusual bruising for unknown reasons. ? A serious fall or if you hit your head (even if there is no bleeding).  Some medicines may interact with Pradaxa and might increase your risk of bleeding or clotting while on Pradaxa. To help avoid this, consult your healthcare provider or pharmacist prior to using any new prescription or non-prescription medications, including herbals, vitamins, non-steroidal anti-inflammatory drugs (NSAIDs) and supplements.  This website has more information on Pradaxa (dabigatran): https://www.pradaxa.com    Stroke Prevention Some health problems and behaviors may make it more likely for you to have a stroke. Below are ways to lessen your risk of having a stroke.  Be active for at least 30 minutes on most or all days.  Do not smoke. Try not to be around others who smoke.  Do not drink too much alcohol.  Do not have more than 2 drinks a day if you are a man.  Do not have more than 1 drink a day if you are a woman and are not pregnant.  Eat healthy foods, such as fruits and vegetables. If you were put on a specific diet, follow the diet as told.  Keep your cholesterol levels under control through diet and medicines. Look for foods that are low in saturated fat, trans fat, cholesterol, and are high in fiber.  If you have diabetes, follow all diet plans  and take your medicine as told.  Ask your doctor if you need treatment to lower your blood pressure. If you have high blood pressure (hypertension), follow all diet plans and take your medicine as told by your doctor.  If you are 42-66 years old, have your blood pressure checked every 3-5 years. If you are age 64 or older, have your blood pressure checked every year.  Keep a healthy weight. Eat foods that are low in calories, salt, saturated fat, trans fat, and cholesterol.  Do not take drugs.  Avoid birth  control pills, if this applies. Talk to your doctor about the risks of taking birth control pills.  Talk to your doctor if you have sleep problems (sleep apnea).  Take all medicine as told by your doctor.  You may be told to take aspirin or blood thinner medicine. Take this medicine as told by your doctor.  Understand your medicine instructions.  Make sure any other conditions you have are being taken care of. Get help right away if:  You suddenly lose feeling (you feel numb) or have weakness in your face, arm, or leg.  Your face or eyelid hangs down to one side.  You suddenly feel confused.  You have trouble talking (aphasia) or understanding what people are saying.  You suddenly have trouble seeing in one or both eyes.  You suddenly have trouble walking.  You are dizzy.  You lose your balance or your movements are clumsy (uncoordinated).  You suddenly have a very bad headache and you do not know the cause.  You have new chest pain.  Your heart feels like it is fluttering or skipping a beat (irregular heartbeat). Do not wait to see if the symptoms above go away. Get help right away. Call your local emergency services (911 in [place] Do not drive yourself to the hospital. This information is not intended to replace advice given to you by your health care provider. Make sure you discuss any questions you have with your health care provider. Document Released: 01/30/2012 Document Revised: 01/06/2016 Document Reviewed: 01/31/2013 Elsevier Interactive Patient Education  2017 [employer].    Diabetes Mellitus and Food It is important for you to manage your blood sugar (glucose) level. Your blood glucose level can be greatly affected by what you eat. Eating healthier foods in the appropriate amounts throughout the day at about the same time each day will help you control your blood glucose level. It can also help slow or prevent worsening of your diabetes mellitus. Healthy eating  may even help you improve the level of your blood pressure and reach or maintain a healthy weight. General recommendations for healthful eating and cooking habits include:  Eating meals and snacks regularly. Avoid going long periods of time without eating to lose weight.  Eating a diet that consists mainly of plant-based foods, such as fruits, vegetables, nuts, legumes, and whole grains.  Using low-heat cooking methods, such as baking, instead of high-heat cooking methods, such as deep frying. Work with your dietitian to make sure you understand how to use the Nutrition Facts information on food labels. How can food affect me? Carbohydrates  Carbohydrates affect your blood glucose level more than any other type of food. Your dietitian will help you determine how many carbohydrates to eat at each meal and teach you how to count carbohydrates. Counting carbohydrates is important to keep your blood glucose at a healthy level, especially if you are using insulin or taking certain medicines for diabetes  mellitus. Alcohol  Alcohol can cause sudden decreases in blood glucose (hypoglycemia), especially if you use insulin or take certain medicines for diabetes mellitus. Hypoglycemia can be a life-threatening condition. Symptoms of hypoglycemia (sleepiness, dizziness, and disorientation) are similar to symptoms of having too much alcohol. If your health care provider has given you approval to drink alcohol, do so in moderation and use the following guidelines:  Women should not have more than one drink per day, and men should not have more than two drinks per day. One drink is equal to:  12 oz of beer.  5 oz of wine.  1 oz of hard liquor.  Do not drink on an empty stomach.  Keep yourself hydrated. Have water, diet soda, or unsweetened iced tea.  Regular soda, juice, and other mixers might contain a lot of carbohydrates and should be counted. What foods are not recommended? As you make food  choices, it is important to remember that all foods are not the same. Some foods have fewer nutrients per serving than other foods, even though they might have the same number of calories or carbohydrates. It is difficult to get your body what it needs when you eat foods with fewer nutrients. Examples of foods that you should avoid that are high in calories and carbohydrates but low in nutrients include:  Trans fats (most processed foods list trans fats on the Nutrition Facts label).  Regular soda.  Juice.  Candy.  Sweets, such as cake, pie, doughnuts, and cookies.  Fried foods. What foods can I eat? Eat nutrient-rich foods, which will nourish your body and keep you healthy. The food you should eat also will depend on several factors, including:  The calories you need.  The medicines you take.  Your weight.  Your blood glucose level.  Your blood pressure level.  Your cholesterol level. You should eat a variety of foods, including:  Protein.  Lean cuts of meat.  Proteins low in saturated fats, such as fish, egg whites, and beans. Avoid processed meats.  Fruits and vegetables.  Fruits and vegetables that may help control blood glucose levels, such as apples, mangoes, and yams.  Dairy products.  Choose fat-free or low-fat dairy products, such as milk, yogurt, and cheese.  Grains, bread, pasta, and rice.  Choose whole grain products, such as multigrain bread, whole oats, and brown rice. These foods may help control blood pressure.  Fats.  Foods containing healthful fats, such as nuts, avocado, olive oil, canola oil, and fish. Does everyone with diabetes mellitus have the same meal plan? Because every person with diabetes mellitus is different, there is not one meal plan that works for everyone. It is very important that you meet with a dietitian who will help you create a meal plan that is just right for you. This information is not intended to replace advice given to  you by your health care provider. Make sure you discuss any questions you have with your health care provider. Document Released: 04/27/2005 Document Revised: 01/06/2016 Document Reviewed: 06/27/2013 Elsevier Interactive Patient Education  2017 [place].   Blood Glucose Monitoring, Adult Monitoring your blood sugar (glucose) helps you manage your diabetes. It also helps you and your health care provider determine how well your diabetes management plan is working. Blood glucose monitoring involves checking your blood glucose as often as directed, and keeping a record (log) of your results over time. Why should I monitor my blood glucose? Checking your blood glucose regularly can:  Help you  understand how food, exercise, illnesses, and medicines affect your blood glucose.  Let you know what your blood glucose is at any time. You can quickly tell if you are having low blood glucose (hypoglycemia) or high blood glucose (hyperglycemia).  Help you and your health care provider adjust your medicines as needed. When should I check my blood glucose? Follow instructions from your health care provider about how often to check your blood glucose. This may depend on:  The type of diabetes you have.  How well-controlled your diabetes is.  Medicines you are taking. If you have type 1 diabetes:   Check your blood glucose at least 2 times a day.  Also check your blood glucose:  Before every insulin injection.  Before and after exercise.  Between meals.  2 hours after a meal.  Occasionally between 2:00 a.m. and 3:00 a.m., as directed.  Before potentially dangerous tasks, like driving or using heavy machinery.  At bedtime.  You may need to check your blood glucose more often, up to 6-10 times a day:  If you use an insulin pump.  If you need multiple daily injections (MDI).  If your diabetes is not well-controlled.  If you are ill.  If you have a history of severe  hypoglycemia.  If you have a history of not knowing when your blood glucose is getting low (hypoglycemia unawareness). If you have type 2 diabetes:   If you take insulin or other diabetes medicines, check your blood glucose at least 2 times a day.  If you are on intensive insulin therapy, check your blood glucose at least 4 times a day. Occasionally, you may also need to check between 2:00 a.m. and 3:00 a.m., as directed.  Also check your blood glucose:  Before and after exercise.  Before potentially dangerous tasks, like driving or using heavy machinery.  You may need to check your blood glucose more often if:  Your medicine is being adjusted.  Your diabetes is not well-controlled.  You are ill. What is a blood glucose log?  A blood glucose log is a record of your blood glucose readings. It helps you and your health care provider:  Look for patterns in your blood glucose over time.  Adjust your diabetes management plan as needed.  Every time you check your blood glucose, write down your result and notes about things that may be affecting your blood glucose, such as your diet and exercise for the day.  Most glucose meters store a record of glucose readings in the meter. Some meters allow you to download your records to a computer. How do I check my blood glucose? Follow these steps to get accurate readings of your blood glucose: Supplies needed    Blood glucose meter.  Test strips for your meter. Each meter has its own strips. You must use the strips that come with your meter.  A needle to prick your finger (lancet). Do not use lancets more than once.  A device that holds the lancet (lancing device).  A journal or log book to write down your results. Procedure   Wash your hands with soap and water.  Prick the side of your finger (not the tip) with the lancet. Use a different finger each time.  Gently rub the finger until a small drop of blood appears.  Follow  instructions that come with your meter for inserting the test strip, applying blood to the strip, and using your blood glucose meter.  Write down your result  and any notes. Alternative testing sites   Some meters allow you to use areas of your body other than your finger (alternative sites) to test your blood.  If you think you may have hypoglycemia, or if you have hypoglycemia unawareness, do not use alternative sites. Use your finger instead.  Alternative sites may not be as accurate as the fingers, because blood flow is slower in these areas. This means that the result you get may be delayed, and it may be different from the result that you would get from your finger.  The most common alternative sites are:  Forearm.  Thigh.  Palm of the hand. Additional tips   Always keep your supplies with you.  If you have questions or need help, all blood glucose meters have a 24-hour hotline number that you can call. You may also contact your health care provider.  After you use a few boxes of test strips, adjust (calibrate) your blood glucose meter by following instructions that came with your meter. This information is not intended to replace advice given to you by your health care provider. Make sure you discuss any questions you have with your health care provider. Document Released: 08/03/2003 Document Revised: 02/18/2016 Document Reviewed: 01/10/2016 Elsevier Interactive Patient Education  2017 [employer].

## 2016-12-14 NOTE — Evaluation (Signed)
Speech Language Pathology Evaluation Patient Details Name: Thomas [name] MRN: [id number] DOB: 1926/07/17 Today's Date: 12/14/2016 Time: 1696-7893 SLP Time Calculation (min) (ACUTE ONLY): 12 min  Problem List:  Patient Active Problem List   Diagnosis Date Noted  . Stroke (cerebrum) ([place]) 12/13/2016  . Acute cerebral infarction ([place])   . Insomnia 12/07/2016  . Atelectasis 10/30/2016  . Alcohol abuse 08/04/2016  . Permanent atrial fibrillation ([place]) 07/21/2016  . Malignant mesothelioma of pleura ([place]) 02/16/2016  . Perforation of right tympanic membrane 11/24/2013  . Carotid stenosis 02/21/2013  . TIA (transient ischemic attack) 02/17/2013  . GERD (gastroesophageal reflux disease) 05/17/2011  . Esophageal dysphagia 05/17/2011  . Stricture and stenosis of esophagus 08/29/2010  . Allergic rhinitis 01/16/2008  . 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 PROSTATE CANCER, HX OF 03/26/2007  . Hyperlipidemia 03/20/2007  . Essential hypertension 03/20/2007   Past Medical History:  Past Medical History:  Diagnosis Date  . Adenomatous colon polyp 2009  . Allergy   . Carotid artery occlusion   . Diverticulosis of colon (without mention of hemorrhage)   . ED (erectile dysfunction)   . Esophageal stricture   . Esophagitis   . GERD (gastroesophageal reflux disease)   . Hiatal hernia   . Hypertension   . Prostate cancer ([place])   . Unspecified gastritis and gastroduodenitis without mention of hemorrhage    Past Surgical History:  Past Surgical History:  Procedure Laterality Date  . ENDARTERECTOMY Left 03/05/2013   Procedure: ENDARTERECTOMY CAROTID;  Surgeon: Conrad Park Hills, MD;  Location: [facility];  Service: Vascular;  Laterality: Left;  . ESOPHAGOGASTRODUODENOSCOPY (EGD) WITH ESOPHAGEAL DILATION    . PROSTATE SURGERY     Laser surgery   HPI:  83 y.o. male with history of paroxysmal atrial fibrillation, recently diagnosed mesothelioma with recurrent pleural effusion, hypertension,  hyperlipidemia was brought to the ER on 12/13/16 after patient had persistent dizziness since morning and almost fell trying to walk. MRI was obtained, revealing a cluster of subcentimeter foci of reduced diffusion within the medial right occipital lobe, consistent with acute infarction.   Assessment / Plan / Recommendation Clinical Impression  Pt's cognitive-linguistic function is at baseline -he has short-term memory deficits, which he acknowledged and which were confirmed with assessment.  Comprehension, speech, expression are intact.  Adequate attention.  He lives with his wife and adult son.  No acute SLP needs are identified - our services will sign off.     SLP Assessment  SLP Recommendation/Assessment: Patient does not need any further Speech Lanaguage Pathology Services SLP Visit Diagnosis: Cognitive communication deficit (R41.841)    Follow Up Recommendations       Frequency and Duration           SLP Evaluation Cognition  Overall Cognitive Status: History of cognitive impairments - at baseline Arousal/Alertness: Awake/alert Orientation Level: Oriented X4 Attention: Sustained Sustained Attention: Appears intact Memory: Impaired Memory Impairment: Storage deficit;Retrieval deficit Awareness: Appears intact Problem Solving: Impaired Problem Solving Impairment: Verbal basic       Comprehension  Auditory Comprehension Overall Auditory Comprehension: Appears within functional limits for tasks assessed    Expression Expression Primary Mode of Expression: Verbal Verbal Expression Overall Verbal Expression: Appears within functional limits for tasks assessed Written Expression Dominant Hand: Right   Oral / Motor  Oral Motor/Sensory Function Overall Oral Motor/Sensory Function: Within functional limits Motor Speech Overall Motor Speech: Appears within functional limits for tasks assessed   GO          Functional  Assessment Tool Used: clinical judgment Functional  Limitations: Memory Memory Current Status (Y5110): At least 40 percent but less than 60 percent impaired, limited or restricted Memory Goal Status (Y1117): At least 40 percent but less than 60 percent impaired, limited or restricted Memory Discharge Status [phone]): At least 40 percent but less than 60 percent impaired, limited or restricted         [name] 12/14/2016, 3:02 PM

## 2016-12-14 NOTE — Progress Notes (Addendum)
STROKE TEAM PROGRESS NOTE   SUBJECTIVE (INTERVAL HISTORY) Patient's son is at the bedside. He gives history of transient vertigo and dizziness and gait ataxia which seems to have improved. He denies any prior history of strokes but MRI shows bilateral occipital infarcts of remote age with a small area of acute ischemia adjacent to the right occipital infarct. He does have a history of atrial fibrillation and was taking eliquis 2.5 twice daily. He had held it recently for lung nodule biopsy but was back on it for the last 5 days   OBJECTIVE Temp:  [97.5 F (36.4 C)-98 F (36.7 C)] 98 F (36.7 C) (05/03 0110) Pulse Rate:  [59-76] 59 (05/03 0656) Cardiac Rhythm: Atrial fibrillation (05/03 0700) Resp:  [16-25] 18 (05/03 0300) BP: (117-185)/(67-107) 176/107 (05/03 1039) SpO2:  [94 %-100 %] 99 % (05/03 0656) Weight:  [74.1 kg (163 lb 5.8 oz)] 74.1 kg (163 lb 5.8 oz) (05/02 2251)  CBC:  Recent Labs Lab 12/13/16 2036  WBC 5.6  NEUTROABS 3.5  HGB 11.6*  HCT 36.3*  MCV 88.8  PLT 378    Basic Metabolic Panel:  Recent Labs Lab 12/13/16 2036  NA 134*  K 5.2*  CL 101  CO2 23  GLUCOSE 130*  BUN 13  CREATININE 1.20  CALCIUM 8.7*   HgbA1c:  Lab Results  Component Value Date   HGBA1C 6.7 (H) 10/12/2011    1. Bilateral occipital encephalomalacia, likely from chronic infarction. Punctate foci of reduced diffusion within encephalomalacia may represent ongoing or recrudescence of infarction. 2. No evidence for additional acute/early subacute infarct, intracranial hemorrhage, or significant mass effect. 3. Advanced chronic microvascular ischemic changes and parenchymal volume loss of the brain. 4. Small bilateral mastoid effusions. Electronically Signed   By: Kristine Garbe M.D.   On: 12/13/2016 20:15       PHYSICAL EXAM Pleasant elderly African-American male currently not in distress. . Afebrile. Head is nontraumatic. Neck is supple without bruit.    Cardiac exam no murmur  or gallop. Lungs are clear to auscultation. Distal pulses are well felt. Neurological Exam ;  Awake  Alert oriented x 3. Normal speech and language.eye movements full without nystagmus.fundi were not visualized. Vision acuity and fields appear normal. Hearing is normal. Palatal movements are normal. Face symmetric. Tongue midline. Normal strength, tone, reflexes and coordination. Normal sensation. Gait deferred.  ASSESSMENT/PLAN Mr. Jabreel Chimento is a 83 y.o. male with history of PAF on apixaban that was held for 3 days but taken the past 5 per son, mesothelioma with recurrent pleural effusion, HTN, HLD presenting with dizziness and "near falls". He did not receive IV t-PA due to unclear LKW.   Stroke:  Acute on chronic bilateral occipital infarcts embolic secondary to known atrial fibrillation and severe occlusive intracranial atherosclerosis  MRI  B occipital acute infarcts and encephalomalacia from chronic infarct. small vessel disease. Atrophy. B mastoid effusions  MRA  severe intracranial stenosis involving distal right vertebral artery, right ICA siphon, bilateral posterior cerebral artery and moderate stenosis of the mid basilar artery  Carotid Doppler  done on 11/27/16 normal  2D Echo  pending  LDL 86  HgbA1c pending  eliquis for VTE prophylaxis  Diet Heart Room service appropriate? Yes; Fluid consistency: Thin  Eliquis (apixaban) daily 2.5 mg prior to admission held for 3 days but taken the past 5 per son, now on Eliquis (apixaban) daily. Dr. Leonie Man requested change to Pradaxa. [facility] pharmacist aware  Therapy recommendations:  OP OT  Disposition:  Anticipate  return home  Atrial Fibrillation  Home anticoagulation:  Eliquis (apixaban) daily continued in the hospital    Hypertension  Elevated but Stable Permissive hypertension (OK if < 220/120) but gradually normalize in 5-7 days Long-term BP goal normotensive  Hyperlipidemia  Home meds:  crestor 10, resumed in  hospital  LDL 86 goal  Continue statin at discharge  Other Stroke Risk Factors  Advanced age  ETOH use  UDS / ETOH level not performed   Obesity, Body mass index is 22.78 kg/m.  Other Active Problems  mesothelioma with recurrent left pleural effusion   Normocytic normochromic anemia. chronic  Hospital day # 0  I have personally examined this patient, reviewed notes, independently viewed imaging studies, participated in medical decision making and plan of care.ROS completed by me personally and pertinent positives fully documented  I have made any additions or clarifications directly to the above note. He presented with transient vertigo and dizziness likely due to posterior circulation TIA from severe occlusive posterior circulation disease. He also has atrial fibrillation and silent bilateral occipital infarcts. Long discussion with the patient and son at the bedside with regards to treatment options. He is not a candidate for angioplasty stenting. Recommend switching eliquis to Pradaxa after pharmacy consultation. Patient and son agreeable with plan. Greater than 50% time during this 35 minute visit was spent on counseling and coordination of care about stroke risk from atrial fibrillation, intracranial stenosis, discussion of treatment options and answering questions  Antony Contras, MD Medical Director [place] Pager: [phone] 12/14/2016 3:19 PM   To contact Stroke Continuity provider, please refer to [email or web address] After hours, contact General Neurology

## 2016-12-15 ENCOUNTER — Inpatient Hospital Stay (HOSPITAL_COMMUNITY): Payer: Medicare Other

## 2016-12-15 ENCOUNTER — Encounter: Payer: Self-pay | Admitting: *Deleted

## 2016-12-15 DIAGNOSIS — I10 Essential (primary) hypertension: Secondary | ICD-10-CM

## 2016-12-15 DIAGNOSIS — I69398 Other sequelae of cerebral infarction: Secondary | ICD-10-CM

## 2016-12-15 DIAGNOSIS — I639 Cerebral infarction, unspecified: Secondary | ICD-10-CM

## 2016-12-15 DIAGNOSIS — I482 Chronic atrial fibrillation: Secondary | ICD-10-CM

## 2016-12-15 LAB — BASIC METABOLIC PANEL
Anion gap: 10 (ref 5–15)
BUN: 10 mg/dL (ref 6–20)
CHLORIDE: 101 mmol/L (ref 101–111)
CO2: 24 mmol/L (ref 22–32)
Calcium: 8.9 mg/dL (ref 8.9–10.3)
Creatinine, Ser: 1.22 mg/dL (ref 0.61–1.24)
GFR calc Af Amer: 59 mL/min — ABNORMAL LOW (ref >60)
GFR calc non Af Amer: 51 mL/min — ABNORMAL LOW (ref >60)
Glucose, Bld: 119 mg/dL — ABNORMAL HIGH (ref 65–99)
POTASSIUM: 3.5 mmol/L (ref 3.5–5.1)
SODIUM: 135 mmol/L (ref 135–145)

## 2016-12-15 LAB — ECHOCARDIOGRAM COMPLETE
HEIGHTINCHES: 71 in
WEIGHTICAEL: 2613.77 [oz_av]

## 2016-12-15 LAB — GLUCOSE, CAPILLARY
GLUCOSE-CAPILLARY: 114 mg/dL — AB (ref 65–99)
GLUCOSE-CAPILLARY: 155 mg/dL — AB (ref 65–99)
GLUCOSE-CAPILLARY: 139 mg/dL — AB (ref 65–99)

## 2016-12-15 LAB — HEMOGLOBIN A1C
Hgb A1c MFr Bld: 6.8 % — ABNORMAL HIGH (ref 4.8–5.6)
Mean Plasma Glucose: 148 mg/dL

## 2016-12-15 MED ORDER — INSULIN ASPART 100 UNIT/ML ~~LOC~~ SOLN: 0.0000 [IU] | Freq: Every day | SUBCUTANEOUS | Status: DC

## 2016-12-15 MED ORDER — METOCLOPRAMIDE HCL 5 MG/ML IJ SOLN
10.0000 mg | Freq: Once | INTRAMUSCULAR | Status: AC
  Administered 2016-12-15: 10 mg via INTRAVENOUS
  Filled 2016-12-15: qty 2

## 2016-12-15 MED ORDER — INSULIN ASPART 100 UNIT/ML ~~LOC~~ SOLN
0.0000 [IU] | Freq: Three times a day (TID) | SUBCUTANEOUS | Status: DC
  Administered 2016-12-15: 2 [IU] via SUBCUTANEOUS
  Administered 2016-12-16: 1 [IU] via SUBCUTANEOUS

## 2016-12-15 MED ORDER — AMLODIPINE BESYLATE 10 MG PO TABS
10.0000 mg | ORAL_TABLET | Freq: Every day | ORAL | Status: DC
  Administered 2016-12-16: 10 mg via ORAL
  Filled 2016-12-15: qty 1

## 2016-12-15 MED ORDER — MECLIZINE HCL 25 MG PO TABS
25.0000 mg | ORAL_TABLET | Freq: Three times a day (TID) | ORAL | Status: DC
  Administered 2016-12-15 – 2016-12-16 (×3): 25 mg via ORAL
  Filled 2016-12-15 (×3): qty 1

## 2016-12-15 NOTE — Progress Notes (Signed)
Physical Therapy Treatment Patient Details Name: Thomas [name] MRN: [id number] DOB: 1926-07-08 Today's Date: 12/15/2016    History of Present Illness Pt is a 83 y.o. male with history of paroxysmal atrial fibrillation, recently diagnosed mesothelioma with recurrent pleural effusion, hypertension, hyperlipidemia was brought to the ER on 12/13/16 after patient had persistent dizziness since morning and almost fell trying to walk. MRI was obtained, revealing a cluster of subcentimeter foci of reduced diffusion within the medial right occipital lobe, consistent with acute infarction.    PT Comments    Pt making good progress with mobility. He continues to demonstrate mild instability with gait with use of SPC; however, pt very slow and safe with short, shuffling strides. Pt also participated in bilateral LE strengthening exercises (see below). Pt would continue to benefit from skilled physical therapy services at this time while admitted and after d/c to address the below listed limitations in order to improve overall safety and independence with functional mobility.    Follow Up Recommendations  Home health PT     Equipment Recommendations  None recommended by PT    Recommendations for Other Services       Precautions / Restrictions Precautions Precautions: Fall Restrictions Weight Bearing Restrictions: No    Mobility  Bed Mobility Overal bed mobility: Modified Independent             General bed mobility comments: increased time, use of bed rail for in and out of bed  Transfers Overall transfer level: Needs assistance Equipment used: Straight cane Transfers: Sit to/from Stand Sit to Stand: Min guard         General transfer comment: guarding for safety  Ambulation/Gait Ambulation/Gait assistance: Min guard Ambulation Distance (Feet): 100 Feet Assistive device: Straight cane Gait Pattern/deviations: Step-to pattern;Step-through pattern;Decreased step length -  right;Decreased step length - left;Decreased stride length;Shuffle Gait velocity: decreased   General Gait Details: mild instability but no LOB or need for physical assistance, min guard for safety   Stairs            Wheelchair Mobility    Modified Rankin (Stroke Patients Only)       Balance Overall balance assessment: Needs assistance Sitting-balance support: No upper extremity supported;Feet supported Sitting balance-Leahy Scale: Good Sitting balance - Comments: pt able to sit EOB with supervision   Standing balance support: Single extremity supported;During functional activity Standing balance-Leahy Scale: Poor Standing balance comment: pt reliant on at least one UE support                            Cognition Arousal/Alertness: Awake/alert Behavior During Therapy: WFL for tasks assessed/performed Overall Cognitive Status: Impaired/Different from baseline Area of Impairment: Memory                     Memory: Decreased short-term memory                Exercises General Exercises - Lower Extremity Ankle Circles/Pumps: AROM;Both;20 reps;Seated Long Arc Quad: AROM;Strengthening;Both;10 reps;Seated Hip Flexion/Marching: AROM;Strengthening;Both;10 reps;Seated Toe Raises: AROM;Both;20 reps;Seated Other Exercises Other Exercises: pt performed sit-to-stand from reclincer chair x10 without UE supports with min guard    General Comments        Pertinent Vitals/Pain Pain Assessment: No/denies pain    Home Living                      Prior Function  PT Goals (current goals can now be found in the care plan section) Acute Rehab PT Goals PT Goal Formulation: With patient Time For Goal Achievement: 12/28/16 Potential to Achieve Goals: Good Progress towards PT goals: Progressing toward goals    Frequency    Min 4X/week      PT Plan Current plan remains appropriate    Co-evaluation               AM-PAC PT "6 Clicks" Daily Activity  Outcome Measure  Difficulty turning over in bed (including adjusting bedclothes, sheets and blankets)?: None Difficulty moving from lying on back to sitting on the side of the bed? : None Difficulty sitting down on and standing up from a chair with arms (e.g., wheelchair, bedside commode, etc,.)?: A Little Help needed moving to and from a bed to chair (including a wheelchair)?: A Little Help needed walking in hospital room?: A Little Help needed climbing 3-5 steps with a railing? : A Little 6 Click Score: 20    End of Session Equipment Utilized During Treatment: Gait belt Activity Tolerance: Patient tolerated treatment well Patient left: in chair;with call bell/phone within reach;with family/visitor present Nurse Communication: Mobility status PT Visit Diagnosis: Other abnormalities of gait and mobility (R26.89);Other symptoms and signs involving the nervous system (R29.898)     Time: 1037-1050 PT Time Calculation (min) (ACUTE ONLY): 13 min  Charges:  $Gait Training: 8-22 mins                    G Codes:       [place], [place], [place] [phone]    [place] 12/15/2016, 10:53 AM

## 2016-12-15 NOTE — Progress Notes (Signed)
Oncology Nurse Navigator Documentation  Oncology Nurse Navigator Flowsheets 12/15/2016  Navigator Location [facility]  Navigator Encounter Type Letter/Fax/Email/Mailed [place] letter   Treatment Phase Pre-Tx/Tx Discussion  Barriers/Navigation Needs Education  Education Other  Interventions Education  Acuity Level 1  Time Spent with Patient 30

## 2016-12-15 NOTE — Progress Notes (Signed)
Pt with x1 vomiting episode. Daughter states that the dizziness is lasting longer than normal no pain. Meclizine administered at 7:02 am.  Bp 179/95.  [place] paged.

## 2016-12-15 NOTE — Care Management Note (Signed)
Case Management Note  Patient Details  Name: Konstantin Lehnen MRN: [id number] Date of Birth: 01-Mar-1926  Subjective/Objective:                    Action/Plan: Plan is for patient to d/c home with [facility] services when medically ready. CM met with the patient and his son and provided a list of [facility] agencies. Per son [facility] is coming to patients home to see his wife. They would like to use [facility] for the patient also. Santiago Glad with [facility] notified and accepted the referral.    Expected Discharge Date:                  Expected Discharge Plan:  [place]  In-House Referral:     Discharge planning Services  CM Consult  Post Acute Care Choice:  Home Health Choice offered to:  Patient, Adult Children  DME Arranged:    DME Agency:     HH Arranged:  RN, PT, OT HH Agency:  [facility]  Status of Service:  Completed, signed off  If discussed at [place] of Stay Meetings, dates discussed:    Additional Comments:  Pollie Friar, RN 12/15/2016, 1:39 PM

## 2016-12-15 NOTE — Progress Notes (Signed)
PROGRESS NOTE                                                                                                                                                                                                             Patient Demographics:    Thomas [name], is a 83 y.o. male, DOB - Dec 24, 1925, [id number]  Admit date - 12/13/2016   Admitting Physician Rise Patience, MD  Outpatient Primary MD for the patient is Garret Reddish, MD  LOS - 1  Outpatient Specialists: none  Chief Complaint  Patient presents with  . Dizziness       Brief Narrative   83 year old male with history of paroxysmal A. fib, recently diagnosed mesothelioma with recurrent pleural effusion, hypertension, hyperlipidemia presented to the ED with persistent dizziness since the morning of admission. Denied any headache, slurred speech, difficulty swallowing, weakness or numbness of his extremities. Denied any bowel or urinary symptoms. At baseline he is independent but uses cane frequently. Patient is on eliquis or his A. fib which was held for 3 days recently for his pleural biopsy. In the ED head CT was unremarkable while MRI of the brain showed possible stroke in the occipital area.    Subjective:   Patient having severe dizziness and headache. Had an episode of vomiting this blood pressure was elevated this a.m. Denies blurred vision or any weakness.   Assessment  & Plan :   Principal problem Acute occipital stroke switched eliquis to Pradaxa As per stroke team recommendations. (started at a lower dose given his advanced age and her function). 2-D echo and carotid Doppler pending. Lipid panel shows LDL of 86. Continue statin. A1c of 6.8. No further residual weakness but has a dizziness/vertigo. PT recommends home health.  Persistent dizziness/vertigo. Episode of vomiting this morning along with headache. Also systolic blood pressure was  elevated. Would switch meclizine to scheduled and ordered a dose of IV Reglan. Monitor for another day. If symptoms unimproved or worsened will obtain head CT.  Recently diagnosed malignant mesothelioma with recurrent left pleural effusion. Chest x-ray on admission shows complete opacification of left lung. Hospitalist discussed findings with pulmonologist on call. Since patient is not in any respiratory distress and hypoxic with stable findings from prior imaging recommended no further intervention. Patient has appointment with Dr. Julien Nordmann on 5/10.  Paroxysmal A. fib switched  Eliquis to pradaxa per stroke team.  Diabetes mellitus type 2 New finding with A1c of 6.8.  monitor on sliding scale coverage for now.  Essential hypertension On clonidine and amlodipine. Allowing permissive blood pressure.  Hyperlipidemia Continue statin.  Chronic Normocytic anemia Stable  Hyperkalemia Improved in am lab.    Code Status : Full code  Family Communication  : Discussed with her granddaughter in detail at bedside  Disposition Plan  : Home possibly tomorrow if the dizziness improves  Barriers For Discharge : Pending carotid Doppler and 2-D echo. Persistent dizziness  Consults  :  Stroke service  Procedures  :  CT head MRI brain 2-D echo Carotid Dopplers   DVT Prophylaxis  :  Pradaxa  Lab Results  Component Value Date   PLT 250 12/13/2016    Antibiotics  :  Anti-infectives    None        Objective:   Vitals:   12/15/16 0500 12/15/16 0525 12/15/16 0754 12/15/16 0905  BP: (!) 160/89 (!) 148/75 (!) 179/95 (!) 157/76  Pulse: 67 73 68 67  Resp: 18 18 18 18   Temp: 97.9 F (36.6 C) 97.8 F (36.6 C) 97.7 F (36.5 C)   TempSrc: Oral Oral Oral   SpO2: 95% 100% 100% 97%  Weight:      Height:        Wt Readings from Last 3 Encounters:  12/13/16 74.1 kg (163 lb 5.8 oz)  12/07/16 76.9 kg (169 lb 9.6 oz)  12/04/16 85.3 kg (188 lb)     Intake/Output Summary (Last 24  hours) at 12/15/16 1122 Last data filed at 12/14/16 1716  Gross per 24 hour  Intake              240 ml  Output                0 ml  Net              240 ml     Physical Exam  Gen: Distress with some headache and dizziness HEENT:  moist mucosa, supple neck Chest: Diminished bilateral breath sounds CVS: S1 and S2 irregular, no murmurs  GI: soft, NT, ND,  Musculoskeletal: warm, no edema CNS: Alert and oriented, no focal deficit, no neck rigidity or nystagmus    Data Review:    CBC  Recent Labs Lab 12/13/16 2036  WBC 5.6  HGB 11.6*  HCT 36.3*  PLT 250  MCV 88.8  MCH 28.4  MCHC 32.0  RDW 15.1  LYMPHSABS 0.7  MONOABS 1.2*  EOSABS 0.2  BASOSABS 0.0    Chemistries   Recent Labs Lab 12/13/16 2036 12/15/16 0744  NA 134* 135  K 5.2* 3.5  CL 101 101  CO2 23 24  GLUCOSE 130* 119*  BUN 13 10  CREATININE 1.20 1.22  CALCIUM 8.7* 8.9  AST 33  --   ALT 21  --   ALKPHOS 77  --   BILITOT 2.1*  --    ------------------------------------------------------------------------------------------------------------------  Recent Labs  12/14/16 0246  CHOL 128  HDL 29*  LDLCALC 86  TRIG 67  CHOLHDL 4.4    Lab Results  Component Value Date   HGBA1C 6.8 (H) 12/14/2016   ------------------------------------------------------------------------------------------------------------------ No results for input(s): TSH, T4TOTAL, T3FREE, THYROIDAB in the last 72 hours.  Invalid input(s): FREET3 ------------------------------------------------------------------------------------------------------------------ No results for input(s): VITAMINB12, FOLATE, FERRITIN, TIBC, IRON, RETICCTPCT in the last 72 hours.  Coagulation profile No results for input(s): INR, PROTIME in the last  168 hours.  No results for input(s): DDIMER in the last 72 hours.  Cardiac Enzymes No results for input(s): CKMB, TROPONINI, MYOGLOBIN in the last 168 hours.  Invalid input(s):  CK ------------------------------------------------------------------------------------------------------------------ No results found for: BNP  Inpatient Medications  Scheduled Meds: . [START ON 12/16/2016] amLODipine  10 mg Oral Daily  . cloNIDine  0.1 mg Oral TID  . dabigatran  75 mg Oral Q12H  . meclizine  25 mg Oral TID  . rosuvastatin  10 mg Oral q1800   Continuous Infusions: PRN Meds:.acetaminophen **OR** acetaminophen (TYLENOL) oral liquid 160 mg/5 mL **OR** acetaminophen  Micro Results No results found for this or any previous visit (from the past 240 hour(s)).  Radiology Reports Dg Chest 2 View  Result Date: 12/13/2016 CLINICAL DATA:  Shortness of breath. Known left-sided malignant mesothelioma EXAM: CHEST  2 VIEW COMPARISON:  12/04/2016 FINDINGS: Cardiac shadow is within normal limits. Opacification of the left hemithorax is again seen stable from the prior exam. Right lung remains clear. No acute bony abnormality is seen. IMPRESSION: Opacification of the left hemithorax secondary to the patient's known mesothelioma. This is stable from the previous exam. Electronically Signed   By: Inez Catalina M.D.   On: 12/13/2016 19:09   Mr Brain Wo Contrast  Result Date: 12/13/2016 CLINICAL DATA:  83 y/o  M; dizziness, lightheadedness, and nausea. EXAM: MRI HEAD WITHOUT CONTRAST TECHNIQUE: Multiplanar, multiecho pulse sequences of the brain and surrounding structures were obtained without intravenous contrast. COMPARISON:  10/28/2013 CT head FINDINGS: Brain: Foci of encephalomalacia are present within the left-greater-than-right occipital lobes compatible with chronic infarction. There are small foci of reduced diffusion within the regions of encephalomalacia compatible with acute/ early subacute ischemia which may represent ongoing or recrudescence of infarction. Background of advanced chronic microvascular ischemic changes and parenchymal volume loss of the brain. Small cortical chronic  infarction in the left frontal and left parietal lobes. No significant abnormal susceptibility hypointensity to indicate acute intracranial hemorrhage. No hydrocephalus, focal mass effect, or extra-axial collection. Vascular: Persistent central flow voids. Skull and upper cervical spine: Normal marrow signal. Sinuses/Orbits: No significant abnormal signal of the paranasal sinuses. Small bilateral mastoid air cell effusions. Bilateral intra-ocular lens replacement. Other: None. IMPRESSION: 1. Bilateral occipital encephalomalacia, likely from chronic infarction. Punctate foci of reduced diffusion within encephalomalacia may represent ongoing or recrudescence of infarction. 2. No evidence for additional acute/early subacute infarct, intracranial hemorrhage, or significant mass effect. 3. Advanced chronic microvascular ischemic changes and parenchymal volume loss of the brain. 4. Small bilateral mastoid effusions. Electronically Signed   By: Kristine Garbe M.D.   On: 12/13/2016 20:15   Ct Biopsy  Result Date: 12/04/2016 INDICATION: 83 year old with diffuse left pleural thickening and large left pleural effusion. Tissue diagnosis is needed. EXAM: CT-GUIDED BIOPSY OF LEFT PLEURAL THICKENING MEDICATIONS: None. ANESTHESIA/SEDATION: Moderate (conscious) sedation was employed during this procedure. A total of Versed 1.0 mg and Fentanyl 50 mcg was administered intravenously. Moderate Sedation Time: 38 minutes. The patient's level of consciousness and vital signs were monitored continuously by radiology nursing throughout the procedure under my direct supervision. FLUOROSCOPY TIME:  None COMPLICATIONS: None immediate. PROCEDURE: Informed written consent was obtained from the patient after a thorough discussion of the procedural risks, benefits and alternatives. All questions were addressed. Maximal Sterile Barrier Technique was utilized including caps, mask, sterile gowns, sterile gloves, sterile drape, hand  hygiene and skin antiseptic. A timeout was performed prior to the initiation of the procedure. Patient was initially placed prone and CT  images through the chest were obtained. Patient was repositioned on his left side in order to make him more comfortable. Additional images through the left lower chest were obtained. Pleural thickening along the posteromedial left lower chest was targeted for biopsy. The back was prepped and draped in sterile fashion. Skin was anesthetized with 1% lidocaine. 17 gauge coaxial needle was directed into the pleural thickening with CT guidance. A total of 4 core biopsies were obtained with an 18 gauge core device. 17 gauge needle was removed without complication. Bandage placed over the puncture site. FINDINGS: Diffuse pleural thickening most prominent in the left lower chest. Near complete collapse of the left lung with a large left pleural effusion. Biopsy needle was placed in the pleural thickening in the posteromedial left lower chest. No evidence for pneumothorax following the procedure. IMPRESSION: Successful CT-guided core biopsies of the pleural thickening in the posteromedial left lower chest. Electronically Signed   By: Markus Daft M.D.   On: 12/04/2016 13:42   Mr Jodene Nam Head/brain MP Cm  Result Date: 12/14/2016 CLINICAL DATA:  83 year old male with dizziness. EXAM: MRA HEAD WITHOUT CONTRAST TECHNIQUE: Angiographic images of the Circle of Willis were obtained using MRA technique without intravenous contrast. COMPARISON:  Brain MRI 12/13/2016.  Head CT 10/28/2013 FINDINGS: Antegrade flow in the posterior circulation but severe irregularity and multifocal high-grade stenosis in the dominant distal right vertebral artery (series 305, image 9). The right PICA origin remains patent. The non dominant appearing distal left vertebral artery is less irregular and terminates in PICA. Similar moderate to severe basilar artery irregularity, and mid basilar stenosis which is at least  moderate (same image). Distal basilar remains patent as do the bilateral SCA and PCA origins. Moderate to severe bilateral PCA irregularity and stenosis, with distal flow more attenuated on the left. Posterior communicating arteries are diminutive or absent. Antegrade flow in both ICA siphons. Tortuous distal cervical right ICA. Moderate to severe regularity and severe stenosis of the right ICA cavernous segment (series 303, image 10). A laterally directed 5 mm lesion from the distal cavernous segment is probably an atherosclerotic pseudo lesion rather than an aneurysm. Moderate irregularity but no significant stenosis of the left ICA siphon. Both ophthalmic artery origins remain patent. Moderate irregularity also of the supraclinoid right ICA. Both carotid termini remain patent. Mild to moderate irregularity at the bilateral MCA and ACA origins. Moderate irregularity and stenosis of the right A1 segment. Anterior communicating artery and visible ACA branches are within normal limits. Moderate irregularity and mild bilateral MCA M1 segment stenosis. Both MCA bifurcations remain patent. No MCA branch occlusion is identified. There is moderate stenosis at the right MCA bifurcation. IMPRESSION: 1. Negative for large vessel occlusion but positive for severe intracranial atherosclerosis. 2. Severe stenoses of the distal Right Vertebral Artery, Right ICA siphon, and bilateral PCAs (note chronic bilateral PCA infarcts). 3. Moderate stenoses of the mid Basilar artery, Right ACA A1 segment, and Right MCA bifurcation. Electronically Signed   By: Genevie Ann M.D.   On: 12/14/2016 11:45    Time Spent in minutes  25   Louellen Molder M.D on 12/15/2016 at 11:22 AM  Between 7am to 7pm - Pager - [phone]  After 7pm go to [email or web address] - password [facility]  [facility] -  Office  [phone]

## 2016-12-15 NOTE — Progress Notes (Signed)
Called Vascular spoke with Thomas [name] who stated that pt will have Echo and Doppler today.

## 2016-12-15 NOTE — Progress Notes (Addendum)
Preliminary results by tech - Carotid Duplex Completed/ Right ICA 1-39%, left CEA is patent without evidence of stenosis. Bilateral vertebral arteries demonstrated antegrade flow. No change from prior study dated 11/27/16. Oda Cogan, BS, RDMS, RVT

## 2016-12-15 NOTE — Progress Notes (Signed)
Md notified about vomiting episode and increased dizziness instructed to recheck BP and monitor. Md will come to see pt in room.

## 2016-12-15 NOTE — Progress Notes (Signed)
STROKE TEAM PROGRESS NOTE   SUBJECTIVE (INTERVAL HISTORY) Patient's son is at the bedside. He yet has transient vertigo and dizziness  OBJECTIVE Temp:  [97.6 F (36.4 C)-97.9 F (36.6 C)] 97.8 F (36.6 C) (05/04 1258) Pulse Rate:  [54-73] 64 (05/04 1258) Cardiac Rhythm: Atrial fibrillation;Bundle branch block (05/04 0700) Resp:  [18-20] 18 (05/04 1258) BP: (113-179)/(71-97) 113/71 (05/04 1258) SpO2:  [95 %-100 %] 100 % (05/04 1258)  CBC:   Recent Labs Lab 12/13/16 2036  WBC 5.6  NEUTROABS 3.5  HGB 11.6*  HCT 36.3*  MCV 88.8  PLT 485    Basic Metabolic Panel:   Recent Labs Lab 12/13/16 2036 12/15/16 0744  NA 134* 135  K 5.2* 3.5  CL 101 101  CO2 23 24  GLUCOSE 130* 119*  BUN 13 10  CREATININE 1.20 1.22  CALCIUM 8.7* 8.9   HgbA1c:  Lab Results  Component Value Date   HGBA1C 6.8 (H) 12/14/2016    1. Bilateral occipital encephalomalacia, likely from chronic infarction. Punctate foci of reduced diffusion within encephalomalacia may represent ongoing or recrudescence of infarction. 2. No evidence for additional acute/early subacute infarct, intracranial hemorrhage, or significant mass effect. 3. Advanced chronic microvascular ischemic changes and parenchymal volume loss of the brain. 4. Small bilateral mastoid effusions. Electronically Signed   By: Kristine Garbe M.D.   On: 12/13/2016 20:15       PHYSICAL EXAM Pleasant elderly African-American male currently not in distress. . Afebrile. Head is nontraumatic. Neck is supple without bruit.    Cardiac exam no murmur or gallop. Lungs are clear to auscultation. Distal pulses are well felt. Neurological Exam ;  Awake  Alert oriented x 3. Normal speech and language.eye movements full without nystagmus.fundi were not visualized. Vision acuity and fields appear normal. Hearing is normal. Palatal movements are normal. Face symmetric. Tongue midline. Normal strength, tone, reflexes and coordination. Normal  sensation. Gait deferred.  ASSESSMENT/PLAN Thomas [name] is a 83 y.o. male with history of PAF on apixaban that was held for 3 days but taken the past 5 per son, mesothelioma with recurrent pleural effusion, HTN, HLD presenting with dizziness and "near falls". He did not receive IV t-PA due to unclear LKW.   Stroke:  Acute on chronic bilateral occipital infarcts embolic secondary to known atrial fibrillation and severe occlusive intracranial atherosclerosis  MRI  B occipital acute infarcts and encephalomalacia from chronic infarct. small vessel disease. Atrophy. B mastoid effusions  MRA  severe intracranial stenosis involving distal right vertebral artery, right ICA siphon, bilateral posterior cerebral artery and moderate stenosis of the mid basilar artery  Carotid Doppler  done on 11/27/16 normal  2D Echo  pending  LDL 86  HgbA1c 6.8  Pradaxa for VTE prophylaxis Diet Heart Room service appropriate? Yes; Fluid consistency: Thin  Eliquis (apixaban) daily 2.5 mg prior to admission held for 3 days but taken the past 5 per son, now on Eliquis (apixaban) daily. Dr. Leonie Man requested change to Pradaxa. [facility] pharmacist aware  Therapy recommendations:  OP OT  Disposition:  Anticipate return home  Atrial Fibrillation  Home anticoagulation:  Eliquis (apixaban) daily continued in the hospital Changed to pradaxa now    Hypertension  Elevated but Stable Permissive hypertension (OK if < 220/120) but gradually normalize in 5-7 days Long-term BP goal normotensive  Hyperlipidemia  Home meds:  crestor 10, resumed in hospital  LDL 86 goal  Continue statin at discharge  Other Stroke Risk Factors  Advanced age  ETOH use  UDS / ETOH level not performed   Obesity, Body mass index is 22.78 kg/m.  Other Active Problems  mesothelioma with recurrent left pleural effusion   Normocytic normochromic anemia. chronic  Hospital day # 1  I have personally examined this patient,  reviewed notes, independently viewed imaging studies, participated in medical decision making and plan of care.ROS completed by me personally and pertinent positives fully documented  I have made any additions or clarifications directly to the above note. He presented with transient vertigo and dizziness likely due to posterior circulation TIA from severe occlusive posterior circulation disease. He also has atrial fibrillation and silent bilateral occipital infarcts. Long discussion with the patient and son at the bedside with regards to treatment options. He is not a candidate for angioplasty stenting. Have switched eliquis to Pradaxa after pharmacy consultation. Patient and son agreeable with plan.  Discharge home after echo. Follow-up as an outpatient in stroke clinic in 6 weeks. Stroke team will sign off. Antony Contras, MD Medical Director [facility] Pager: [phone] 12/15/2016 1:50 PM   To contact Stroke Continuity provider, please refer to [email or web address] After hours, contact General Neurology

## 2016-12-16 DIAGNOSIS — E785 Hyperlipidemia, unspecified: Secondary | ICD-10-CM

## 2016-12-16 DIAGNOSIS — I69398 Other sequelae of cerebral infarction: Secondary | ICD-10-CM

## 2016-12-16 DIAGNOSIS — R42 Dizziness and giddiness: Secondary | ICD-10-CM

## 2016-12-16 DIAGNOSIS — E784 Other hyperlipidemia: Secondary | ICD-10-CM

## 2016-12-16 DIAGNOSIS — E875 Hyperkalemia: Secondary | ICD-10-CM | POA: Diagnosis present

## 2016-12-16 DIAGNOSIS — E1169 Type 2 diabetes mellitus with other specified complication: Secondary | ICD-10-CM | POA: Diagnosis present

## 2016-12-16 LAB — GLUCOSE, CAPILLARY
GLUCOSE-CAPILLARY: 121 mg/dL — AB (ref 65–99)
Glucose-Capillary: 106 mg/dL — ABNORMAL HIGH (ref 65–99)

## 2016-12-16 MED ORDER — LIVING WELL WITH DIABETES BOOK
Freq: Once | Status: AC
  Administered 2016-12-16: 08:00:00
  Filled 2016-12-16: qty 1

## 2016-12-16 MED ORDER — DABIGATRAN ETEXILATE MESYLATE 75 MG PO CAPS: 75.0000 mg | ORAL_CAPSULE | Freq: Two times a day (BID) | ORAL | 0 refills | Status: DC

## 2016-12-16 MED ORDER — METOCLOPRAMIDE HCL 5 MG PO TABS: 5.0000 mg | ORAL_TABLET | Freq: Three times a day (TID) | ORAL | 0 refills | Status: DC | PRN

## 2016-12-16 MED ORDER — FREESTYLE SYSTEM KIT: 1.0000 | PACK | Freq: Three times a day (TID) | 1 refills | Status: DC

## 2016-12-16 MED ORDER — MECLIZINE HCL 25 MG PO TABS
25.0000 mg | ORAL_TABLET | Freq: Three times a day (TID) | ORAL | 0 refills | Status: DC | PRN
Start: 2016-12-16 — End: 2017-01-09

## 2016-12-16 NOTE — Progress Notes (Signed)
Patient given discharge instructions.  Family at bedside.  All questions and concerns addressed.  Patient left unit with all belongings in wheelchair accompanied by staff.

## 2016-12-16 NOTE — Progress Notes (Signed)
Printed out information for DM11 and dizziness. Arthor Captain LPN

## 2016-12-16 NOTE — Discharge Summary (Signed)
Physician Discharge Summary  Thomas [name] OFB:[id number] DOB: July 14, 1926 DOA: 12/13/2016  PCP: Marin Olp, MD  Admit date: 12/13/2016 Discharge date: 12/16/2016  Admitted From: Home Disposition:  Home  Recommendations for Outpatient Follow-up:  1. Follow up with PCP in 1-2 weeks 2. Follow-up with Dr. Leonie Man in the stroke clinic in 6 weeks. 3. eliquis is being switched to pradaxa due to stroke. He also has new onset diabetes mellitus.  Home Health: PT/OT/RN Equipment/Devices: None  Discharge Condition: Fair CODE STATUS: Full code Diet recommendation: Heart Healthy / Carb Modified /     Discharge Diagnoses:  Principal Problem:   Acute Occipital stroke ([place])  Active Problems:   Hyperlipidemia   Essential hypertension   Malignant mesothelioma of pleura (HCC)   Permanent atrial fibrillation ([facility])   Vertigo following cerebrovascular accident   Hyperkalemia   Type 2 diabetes mellitus with hyperlipidemia ([place])  Brief narrative/history of present illness 83 year old male with history of paroxysmal A. fib, recently diagnosed mesothelioma with recurrent pleural effusion, hypertension, hyperlipidemia presented to the ED with persistent dizziness since the morning of admission. Denied any headache, slurred speech, difficulty swallowing, weakness or numbness of his extremities. Denied any bowel or urinary symptoms. At baseline he is independent but uses cane frequently. Patient is on eliquis or his A. fib which was held for 3 days recently for his pleural biopsy. In the ED head CT was unremarkable while MRI of the brain showed possible stroke in the occipital area.  Hospital course  Principal problem Acute occipital stroke switched eliquis to Pradaxa as per stroke team recommendations. (started at a lower dose given his advanced age and her function). Carotid Doppler without significant stenosis. 2-D echo with normal EF and no cardiac source of emboli. Lipid panel shows LDL of 86.  Continue statin.  A1c of 6.8 suggesting newly diagnosed diabetes.. No further residual weakness but has a dizziness/vertigo. PT recommends home health.  Persistent dizziness/vertigo. Suspected to occipital stroke and elevated blood pressure. Now resolved. Will prescribe when necessary meclizine and Reglan.    Recently diagnosed malignant mesothelioma with recurrent left pleural effusion. Chest x-ray on admission shows complete opacification of left lung. Hospitalist discussed findings with pulmonologist on call. Since patient is not in any respiratory distress and hypoxic with stable findings from prior imaging recommended no further intervention. Patient has appointment with Dr. Julien Nordmann on 5/10.  Paroxysmal A. fib switched Eliquis to pradaxa per stroke team.  Diabetes mellitus type 2 New finding with A1c of 6.8.   CBG has been stable. No start any medications. Prescribe glucometer and teaching on blood glucose monitoring. Also provided resource on the planning. Instructed to keep a log of his blood glucose monitoring at home and shortly to his PCP during outpatient follow-up.   Essential hypertension On clonidine and amlodipine. Allowed permissive blood pressure. Resume home medications upon discharge.  Hyperlipidemia Continue statin.  Chronic Normocytic anemia Stable  Hyperkalemia Improved in subsequent lab without intervention.    Code Status : Full code  Family Communication  : Discussed with her daughter at bedside  Disposition Plan  : Home   Consults  :  Stroke service  Procedures  :  CT head MRI brain 2-D echo Carotid Dopplers   Discharge Instructions   Allergies as of 12/16/2016      Reactions   Ace Inhibitors Hives   Penicillins Swelling   Swelling  of the face. Has patient had a PCN reaction causing immediate rash, facial/tongue/throat swelling, SOB or lightheadedness with hypotension: YES  Has patient had a PCN reaction causing  severe rash involving mucus membranes or skin necrosis: NO Has patient had a PCN reaction that required hospitalizationNO Has patient had a PCN reaction occurring within the last 10 years: NO If all of the above answers are "NO", then may proceed with Cephalosporin use.      Medication List    STOP taking these medications   ELIQUIS 5 MG Tabs tablet Generic drug:  apixaban     TAKE these medications   amLODipine 5 MG tablet Commonly known as:  NORVASC Take 1 tablet (5 mg total) by mouth daily.   cloNIDine 0.1 MG tablet Commonly known as:  CATAPRES TAKE ONE TABLET BY MOUTH THREE TIMES DAILY   dabigatran 75 MG Caps capsule Commonly known as:  PRADAXA Take 1 capsule (75 mg total) by mouth every 12 (twelve) hours.   glucose monitoring kit monitoring kit 1 each by Does not apply route 4 (four) times daily - after meals and at bedtime. 1 month Diabetic Testing Supplies for QAC-QHS accuchecks.   meclizine 25 MG tablet Commonly known as:  ANTIVERT Take 1 tablet (25 mg total) by mouth 3 (three) times daily as needed for dizziness.   metoCLOPramide 5 MG tablet Commonly known as:  REGLAN Take 1 tablet (5 mg total) by mouth every 8 (eight) hours as needed for nausea.   rosuvastatin 10 MG tablet Commonly known as:  CRESTOR Take 1 tablet (10 mg total) by mouth daily.      Follow-up Information    Marin Olp, MD Follow up in 1 week(s).   Specialty:  Family Medicine Contact information: [address] [place] [address] [phone]        Garvin Fila, MD. Schedule an appointment as soon as possible for a visit in 6 week(s).   Specialties:  Neurology, Radiology Contact information: [address] [place] [address] [phone]          Allergies  Allergen Reactions  . Ace Inhibitors Hives  . Penicillins Swelling    Swelling  of the face. Has patient had a PCN reaction causing immediate rash, facial/tongue/throat swelling, SOB  or lightheadedness with hypotension: YES Has patient had a PCN reaction causing severe rash involving mucus membranes or skin necrosis: NO Has patient had a PCN reaction that required hospitalizationNO Has patient had a PCN reaction occurring within the last 10 years: NO If all of the above answers are "NO", then may proceed with Cephalosporin use.        Procedures/Studies: Dg Chest 2 View  Result Date: 12/13/2016 CLINICAL DATA:  Shortness of breath. Known left-sided malignant mesothelioma EXAM: CHEST  2 VIEW COMPARISON:  12/04/2016 FINDINGS: Cardiac shadow is within normal limits. Opacification of the left hemithorax is again seen stable from the prior exam. Right lung remains clear. No acute bony abnormality is seen. IMPRESSION: Opacification of the left hemithorax secondary to the patient's known mesothelioma. This is stable from the previous exam. Electronically Signed   By: Inez Catalina M.D.   On: 12/13/2016 19:09   Mr Brain Wo Contrast  Result Date: 12/13/2016 CLINICAL DATA:  83 y/o  M; dizziness, lightheadedness, and nausea. EXAM: MRI HEAD WITHOUT CONTRAST TECHNIQUE: Multiplanar, multiecho pulse sequences of the brain and surrounding structures were obtained without intravenous contrast. COMPARISON:  10/28/2013 CT head FINDINGS: Brain: Foci of encephalomalacia are present within the left-greater-than-right occipital lobes compatible with chronic infarction. There are small foci of reduced diffusion within the regions  of encephalomalacia compatible with acute/ early subacute ischemia which may represent ongoing or recrudescence of infarction. Background of advanced chronic microvascular ischemic changes and parenchymal volume loss of the brain. Small cortical chronic infarction in the left frontal and left parietal lobes. No significant abnormal susceptibility hypointensity to indicate acute intracranial hemorrhage. No hydrocephalus, focal mass effect, or extra-axial collection. Vascular:  Persistent central flow voids. Skull and upper cervical spine: Normal marrow signal. Sinuses/Orbits: No significant abnormal signal of the paranasal sinuses. Small bilateral mastoid air cell effusions. Bilateral intra-ocular lens replacement. Other: None. IMPRESSION: 1. Bilateral occipital encephalomalacia, likely from chronic infarction. Punctate foci of reduced diffusion within encephalomalacia may represent ongoing or recrudescence of infarction. 2. No evidence for additional acute/early subacute infarct, intracranial hemorrhage, or significant mass effect. 3. Advanced chronic microvascular ischemic changes and parenchymal volume loss of the brain. 4. Small bilateral mastoid effusions. Electronically Signed   By: Kristine Garbe M.D.   On: 12/13/2016 20:15   Ct Biopsy  Result Date: 12/04/2016 INDICATION: 83 year old with diffuse left pleural thickening and large left pleural effusion. Tissue diagnosis is needed. EXAM: CT-GUIDED BIOPSY OF LEFT PLEURAL THICKENING MEDICATIONS: None. ANESTHESIA/SEDATION: Moderate (conscious) sedation was employed during this procedure. A total of Versed 1.0 mg and Fentanyl 50 mcg was administered intravenously. Moderate Sedation Time: 38 minutes. The patient's level of consciousness and vital signs were monitored continuously by radiology nursing throughout the procedure under my direct supervision. FLUOROSCOPY TIME:  None COMPLICATIONS: None immediate. PROCEDURE: Informed written consent was obtained from the patient after a thorough discussion of the procedural risks, benefits and alternatives. All questions were addressed. Maximal Sterile Barrier Technique was utilized including caps, mask, sterile gowns, sterile gloves, sterile drape, hand hygiene and skin antiseptic. A timeout was performed prior to the initiation of the procedure. Patient was initially placed prone and CT images through the chest were obtained. Patient was repositioned on his left side in order to  make him more comfortable. Additional images through the left lower chest were obtained. Pleural thickening along the posteromedial left lower chest was targeted for biopsy. The back was prepped and draped in sterile fashion. Skin was anesthetized with 1% lidocaine. 17 gauge coaxial needle was directed into the pleural thickening with CT guidance. A total of 4 core biopsies were obtained with an 18 gauge core device. 17 gauge needle was removed without complication. Bandage placed over the puncture site. FINDINGS: Diffuse pleural thickening most prominent in the left lower chest. Near complete collapse of the left lung with a large left pleural effusion. Biopsy needle was placed in the pleural thickening in the posteromedial left lower chest. No evidence for pneumothorax following the procedure. IMPRESSION: Successful CT-guided core biopsies of the pleural thickening in the posteromedial left lower chest. Electronically Signed   By: Markus Daft M.D.   On: 12/04/2016 13:42   Mr Jodene Nam Head/brain JG Cm  Result Date: 12/14/2016 CLINICAL DATA:  83 year old male with dizziness. EXAM: MRA HEAD WITHOUT CONTRAST TECHNIQUE: Angiographic images of the Circle of Willis were obtained using MRA technique without intravenous contrast. COMPARISON:  Brain MRI 12/13/2016.  Head CT 10/28/2013 FINDINGS: Antegrade flow in the posterior circulation but severe irregularity and multifocal high-grade stenosis in the dominant distal right vertebral artery (series 305, image 9). The right PICA origin remains patent. The non dominant appearing distal left vertebral artery is less irregular and terminates in PICA. Similar moderate to severe basilar artery irregularity, and mid basilar stenosis which is at least moderate (same image). Distal basilar remains  patent as do the bilateral SCA and PCA origins. Moderate to severe bilateral PCA irregularity and stenosis, with distal flow more attenuated on the left. Posterior communicating arteries are  diminutive or absent. Antegrade flow in both ICA siphons. Tortuous distal cervical right ICA. Moderate to severe regularity and severe stenosis of the right ICA cavernous segment (series 303, image 10). A laterally directed 5 mm lesion from the distal cavernous segment is probably an atherosclerotic pseudo lesion rather than an aneurysm. Moderate irregularity but no significant stenosis of the left ICA siphon. Both ophthalmic artery origins remain patent. Moderate irregularity also of the supraclinoid right ICA. Both carotid termini remain patent. Mild to moderate irregularity at the bilateral MCA and ACA origins. Moderate irregularity and stenosis of the right A1 segment. Anterior communicating artery and visible ACA branches are within normal limits. Moderate irregularity and mild bilateral MCA M1 segment stenosis. Both MCA bifurcations remain patent. No MCA branch occlusion is identified. There is moderate stenosis at the right MCA bifurcation. IMPRESSION: 1. Negative for large vessel occlusion but positive for severe intracranial atherosclerosis. 2. Severe stenoses of the distal Right Vertebral Artery, Right ICA siphon, and bilateral PCAs (note chronic bilateral PCA infarcts). 3. Moderate stenoses of the mid Basilar artery, Right ACA A1 segment, and Right MCA bifurcation. Electronically Signed   By: Genevie Ann M.D.   On: 12/14/2016 11:45    2-D echo Study Conclusions  - Procedure narrative: Transthoracic echocardiography. Technically   difficult study with suboptimal image windows. - Left ventricle: The cavity size was normal. Wall thickness was   increased in a pattern of mild LVH. Systolic function was normal.   The estimated ejection fraction was in the range of 60% to 65%.   Wall motion was normal; there were no regional wall motion   abnormalities. The study is not technically sufficient to allow   evaluation of LV diastolic function. - Mitral valve: Mildly thickened leaflets . There was  trivial   regurgitation. - Left atrium: The atrium was mildly dilated. - Tricuspid valve: There was trivial regurgitation. - Pulmonary arteries: PA peak pressure: 26 mm Hg (S). - Inferior vena cava: The vessel was normal in size. The   respirophasic diameter changes were in the normal range (>= 50%),   consistent with normal central venous pressure.  Impressions:  - Technically difficult study. LVEF 60-65%, mild LVH, normla wall   motion, trivial TR, mild LAE, normal RA size, trivial TR, RVSP 26   mmHg, normal IVC.   Subjective: No further dizziness or vertigo. Denies any weakness.  Discharge Exam: Vitals:   12/16/16 0126 12/16/16 0549  BP: (!) 143/81 (!) 141/84  Pulse: (!) 52 (!) 59  Resp: 18 18  Temp: 97.8 F (36.6 C) 97.6 F (36.4 C)   Vitals:   12/15/16 1644 12/15/16 2101 12/16/16 0126 12/16/16 0549  BP: (!) 143/79 131/80 (!) 143/81 (!) 141/84  Pulse: 99 61 (!) 52 (!) 59  Resp: '18 18 18 18  ' Temp: 97.8 F (36.6 C) 98.2 F (36.8 C) 97.8 F (36.6 C) 97.6 F (36.4 C)  TempSrc: Oral Oral Oral Oral  SpO2: 96% 98% 98% 97%  Weight:      Height:        Gen: Elderly male not in distress HEENT:  moist mucosa, supple neck Chest: Diminished bilateral breath sounds CVS: S1 and S2 irregular, no murmurs  GI: soft, NT, ND,  Musculoskeletal: warm, no edema CNS: Alert and oriented, no focal deficit,    The  results of significant diagnostics from this hospitalization (including imaging, microbiology, ancillary and laboratory) are listed below for reference.     Microbiology: No results found for this or any previous visit (from the past 240 hour(s)).   Labs: BNP (last 3 results) No results for input(s): BNP in the last 8760 hours. Basic Metabolic Panel:  Recent Labs Lab 12/13/16 2036 12/15/16 0744  NA 134* 135  K 5.2* 3.5  CL 101 101  CO2 23 24  GLUCOSE 130* 119*  BUN 13 10  CREATININE 1.20 1.22  CALCIUM 8.7* 8.9   Liver Function Tests:  Recent  Labs Lab 12/13/16 2036  AST 33  ALT 21  ALKPHOS 77  BILITOT 2.1*  PROT 7.2  ALBUMIN 3.0*   No results for input(s): LIPASE, AMYLASE in the last 168 hours. No results for input(s): AMMONIA in the last 168 hours. CBC:  Recent Labs Lab 12/13/16 2036  WBC 5.6  NEUTROABS 3.5  HGB 11.6*  HCT 36.3*  MCV 88.8  PLT 250   Cardiac Enzymes: No results for input(s): CKTOTAL, CKMB, CKMBINDEX, TROPONINI in the last 168 hours. BNP: Invalid input(s): POCBNP CBG:  Recent Labs Lab 12/15/16 1210 12/15/16 1605 12/15/16 2119 12/16/16 0615  GLUCAP 114* 155* 139* 121*   D-Dimer No results for input(s): DDIMER in the last 72 hours. Hgb A1c  Recent Labs  12/14/16 0246  HGBA1C 6.8*   Lipid Profile  Recent Labs  12/14/16 0246  CHOL 128  HDL 29*  LDLCALC 86  TRIG 67  CHOLHDL 4.4   Thyroid function studies No results for input(s): TSH, T4TOTAL, T3FREE, THYROIDAB in the last 72 hours.  Invalid input(s): FREET3 Anemia work up No results for input(s): VITAMINB12, FOLATE, FERRITIN, TIBC, IRON, RETICCTPCT in the last 72 hours. Urinalysis    Component Value Date/Time   COLORURINE YELLOW 12/13/2016 1837   APPEARANCEUR CLEAR 12/13/2016 1837   LABSPEC 1.008 12/13/2016 1837   PHURINE 7.0 12/13/2016 1837   GLUCOSEU NEGATIVE 12/13/2016 1837   HGBUR NEGATIVE 12/13/2016 1837   HGBUR negative 04/20/2010 0000   BILIRUBINUR NEGATIVE 12/13/2016 1837   BILIRUBINUR n 05/18/2015 1123   KETONESUR NEGATIVE 12/13/2016 1837   PROTEINUR NEGATIVE 12/13/2016 1837   UROBILINOGEN 0.2 05/18/2015 1123   UROBILINOGEN 1.0 07/21/2013 1825   NITRITE NEGATIVE 12/13/2016 1837   LEUKOCYTESUR NEGATIVE 12/13/2016 1837   Sepsis Labs Invalid input(s): PROCALCITONIN,  WBC,  LACTICIDVEN Microbiology No results found for this or any previous visit (from the past 240 hour(s)).   Time coordinating discharge: Over 30 minutes  SIGNED:   Louellen Molder, MD  [facility] 12/16/2016, 9:24  AM Pager   If 7PM-7AM, please contact night-coverage [email or web address] Password [email or web address]

## 2016-12-19 ENCOUNTER — Telehealth: Payer: Self-pay | Admitting: Family Medicine

## 2016-12-19 LAB — VAS US CAROTID
LEFT ECA DIAS: -8 cm/s
LEFT VERTEBRAL DIAS: -13 cm/s
LICADDIAS: -21 cm/s
LICAPDIAS: -30 cm/s
Left CCA dist dias: -11 cm/s
Left CCA dist sys: -76 cm/s
Left CCA prox dias: 20 cm/s
Left CCA prox sys: 78 cm/s
Left ICA dist sys: -56 cm/s
Left ICA prox sys: -71 cm/s
RCCAPDIAS: -10 cm/s
RIGHT VERTEBRAL DIAS: 9 cm/s
Right CCA prox sys: -67 cm/s
Right cca dist sys: -31 cm/s

## 2016-12-19 NOTE — Telephone Encounter (Signed)
Yes thanks, may write for hospital bed under ICD 10 code of C45.0 --> would asked advanced if they want handwritten rx or sent in electronically

## 2016-12-19 NOTE — Telephone Encounter (Signed)
Lauren with [facility] would like verbal for home health skilled nursing 2 wk/ 2 1 wk/ 4 2 prn visits   Also eval orders for  PT OT Speech Due to building balance and endurance  OK to leave message/ private phone

## 2016-12-19 NOTE — Telephone Encounter (Signed)
Please see message below- please advise?

## 2016-12-19 NOTE — Telephone Encounter (Signed)
° ° °  Pt daughter ask if Dr Yong Channel would write an order for a hospital bed to [facility] Daughter

## 2016-12-20 ENCOUNTER — Telehealth: Payer: Self-pay | Admitting: Family Medicine

## 2016-12-20 ENCOUNTER — Other Ambulatory Visit: Payer: Self-pay | Admitting: *Deleted

## 2016-12-20 ENCOUNTER — Telehealth: Payer: Self-pay | Admitting: *Deleted

## 2016-12-20 NOTE — Telephone Encounter (Signed)
Called and provided verbal orders as requested 

## 2016-12-20 NOTE — Progress Notes (Signed)
[address]       [place] [address]             [phone]                    [name] [place] Medical Record #[id number] Date of Birth: [birth year]  Referring: Curt Bears, MD Primary Care: Marin Olp, MD  Chief Complaint: Left Pleural Effusion   History of Present Illness:    Thomas [name] 83 y.o. male is seen in the office  today for left pleural effusion. The effusion has been recurrent, first noted in July 2017. Patient now has some sob with exertion but with usuall activity around house denies symptoms.  Now with evidence of mesothelioma bx proven involving the left pleural space. 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    Diagnosis Soft Tissue Needle Core Biopsy, Left Pleura, Posteromedial left lower chest - MALIGNANT MESOTHELIOMA, EPITHELIOID - SEE COMMENT Current Activity/ Functional Status:  Patient is independent with mobility/ambulation, transfers, ADL's, IADL's.   Zubrod Score: At the time of surgery this patient's most appropriate activity status/level should be described as: []    0    Normal activity, no symptoms []    1    Restricted in physical strenuous activity but ambulatory, able to do out light work [x]    2    Ambulatory and capable of self care, unable to do work activities, up and about               >50 % of waking hours                              []    3    Only limited self care, in bed greater than 50% of waking hours []    4    Completely disabled, no self care, confined to bed or chair []    5    Moribund   Past Medical History:  Diagnosis Date  . Adenomatous colon polyp 2009  . Allergy   . Carotid artery occlusion   . Diverticulosis of colon (without mention of hemorrhage)   . ED (erectile dysfunction)   . Esophageal stricture   . Esophagitis   . GERD (gastroesophageal reflux disease)   . Hiatal hernia   .  Hypertension   . Prostate cancer ([place])   . Unspecified gastritis and gastroduodenitis without mention of hemorrhage     Past Surgical History:  Procedure Laterality Date  . ENDARTERECTOMY Left 03/05/2013   Procedure: ENDARTERECTOMY CAROTID;  Surgeon: Conrad Des Peres, MD;  Location: [facility];  Service: Vascular;  Laterality: Left;  . ESOPHAGOGASTRODUODENOSCOPY (EGD) WITH ESOPHAGEAL DILATION    . PROSTATE SURGERY     Laser surgery    Family History  Problem Relation Age of Onset  . Heart disease Father        unclear specifics  . Cancer Mother   . Breast cancer Sister   . Hyperlipidemia Daughter   . Hypertension Daughter   . Hypertension Son     Social History   Social History  . Marital status: Married    Spouse name: N/A  . Number of children: N/A  . Years of education: N/A   Occupational History  . Not on file.   Social History Main Topics  .  Smoking status: Former Smoker    Packs/day: 0.50    Years: 4.00    Types: Cigarettes    Quit date: 12/21/1953  . Smokeless tobacco: Never Used  . Alcohol use 0.0 oz/week     Comment: daily--one alcohol drink a day  . Drug use: No  . Sexual activity: Not on file   Other Topics Concern  . Not on file   Social History Narrative   Married 1948. 6 children. 9 grandkids. 2 greatgrandkids.  (One son was killed in the [place])      [employer]- honorable discharge.       Hobbies: golfing about twice a month.     History  Smoking Status  . Former Smoker  . Packs/day: 0.50  . Years: 4.00  . Types: Cigarettes  . Quit date: 12/21/1953  Smokeless Tobacco  . Never Used    History  Alcohol Use  . 0.0 oz/week    Comment: daily--one alcohol drink a day     Allergies  Allergen Reactions  . Ace Inhibitors Hives  . Penicillins Swelling    Swelling  of the face. Has patient had a PCN reaction causing immediate rash, facial/tongue/throat swelling, SOB or lightheadedness with hypotension:  YES Has patient had a PCN reaction causing severe rash involving mucus membranes or skin necrosis: NO Has patient had a PCN reaction that required hospitalizationNO Has patient had a PCN reaction occurring within the last 10 years: NO If all of the above answers are "NO", then may proceed with Cephalosporin use.    Current Outpatient Prescriptions  Medication Sig Dispense Refill  . amLODipine (NORVASC) 5 MG tablet Take 1 tablet (5 mg total) by mouth daily. 30 tablet 3  . cloNIDine (CATAPRES) 0.1 MG tablet TAKE ONE TABLET BY MOUTH THREE TIMES DAILY 90 tablet 4  . dabigatran (PRADAXA) 75 MG CAPS capsule Take 1 capsule (75 mg total) by mouth every 12 (twelve) hours. (Patient not taking: Reported on 12/21/2016) 60 capsule 0  . glucose monitoring kit (FREESTYLE) monitoring kit 1 each by Does not apply route 4 (four) times daily - after meals and at bedtime. 1 month Diabetic Testing Supplies for QAC-QHS accuchecks. 1 each 1  . meclizine (ANTIVERT) 25 MG tablet Take 1 tablet (25 mg total) by mouth 3 (three) times daily as needed for dizziness. 30 tablet 0  . metoCLOPramide (REGLAN) 5 MG tablet Take 1 tablet (5 mg total) by mouth every 8 (eight) hours as needed for nausea. (Patient not taking: Reported on 12/21/2016) 15 tablet 0  . rosuvastatin (CRESTOR) 10 MG tablet Take 1 tablet (10 mg total) by mouth daily. 92 tablet 3   No current facility-administered medications for this visit.        Review of Systems:     Cardiac Review of Systems: Y or N  Chest Pain [  y  ]  Resting SOB [  n ] Exertional SOB  [  y]  Orthopnea Florencio.Farrier  ]   Pedal Edema n]    Palpitations Florencio.Farrier  ] Syncope  [ n ]   Presyncope [n   ]  General Review of Systems: [Y] = yes [  ]=no Constitional: recent weight change Blue.Reese  ];  Wt loss over the last 3 months [   ] anorexia [  ]; fatigue [  ]; nausea [  ]; night sweats [  ]; fever [  ]; or chills [  ];  Dental: poor dentition[  ]; Last Dentist visit:   Eye : blurred vision [  ];  diplopia [   ]; vision changes [  ];  Amaurosis fugax[  ]; Resp: cough [  ];  wheezing[ mn ];  hemoptysis[  n]; shortness of breath[n  ]; paroxysmal nocturnal dyspnea[  ]; dyspnea on exertion[y  ]; or orthopnea[  ];  GI:  gallstones[  ], vomiting[n  ];  dysphagia[  ]; melena[n  ];  hematochezia [  ]; heartburn[  ];   Hx of  Colonoscopy[  ]; GU: kidney stones [  ]; hematuria[  ];   dysuria [  ];  nocturia[  ];  history of     obstruction [  ]; urinary frequency [  ]             Skin: rash, swelling[  ];, hair loss[  ];  peripheral edema[  ];  or itching[  ]; Musculosketetal: myalgias[  ];  joint swelling[  ];  joint erythema[  ];  joint pain[  ];  back pain[  ];  Heme/Lymph: bruising[  ];  bleeding[  ];  anemia[  ];  Neuro: TIA[  ];  headaches[  ];  stroke[  ];  vertigo[  ];  seizures[  ];   paresthesias[  ];  difficulty walking[n  ];  Psych:depression[  ]; anxiety[  ];  Endocrine: diabetes[n  ];  thyroid dysfunction[n  ];  Immunizations: Flu up to date [  ]; Pneumococcal up to date [  ];  Other:  Physical Exam: BP (!) 143/86   Pulse 74   Temp 98.8 F (37.1 C)   Resp 17   Wt 166 lb 4.8 oz (75.4 kg)   SpO2 98%   BMI 23.19 kg/m   PHYSICAL EXAMINATION: General appearance: alert, cooperative and appears stated age Head: Normocephalic, without obvious abnormality, atraumatic Neck: no adenopathy, no carotid bruit, no JVD, supple, symmetrical, trachea midline and thyroid not enlarged, symmetric, no tenderness/mass/nodules Lymph nodes: Cervical, supraclavicular, and axillary nodes normal. Resp: diminished breath sounds LLL and LUL Back: symmetric, no curvature. ROM normal. No CVA tenderness. Cardio: regular rate and rhythm, S1, S2 normal, no murmur, click, rub or gallop GI: soft, non-tender; bowel sounds normal; no masses,  no organomegaly Extremities: extremities normal, atraumatic, no cyanosis or edema and Homans sign is negative, no sign of DVT Neurologic: Grossly normal  Diagnostic  Studies & Laboratory data:     Recent Radiology Findings:   Dg Chest 2 View  Result Date: 12/13/2016 CLINICAL DATA:  Shortness of breath. Known left-sided malignant mesothelioma EXAM: CHEST  2 VIEW COMPARISON:  12/04/2016 FINDINGS: Cardiac shadow is within normal limits. Opacification of the left hemithorax is again seen stable from the prior exam. Right lung remains clear. No acute bony abnormality is seen. IMPRESSION: Opacification of the left hemithorax secondary to the patient's known mesothelioma. This is stable from the previous exam. Electronically Signed   By: Inez Catalina M.D.   On: 12/13/2016 19:09   Mr Brain Wo Contrast  Result Date: 12/13/2016 CLINICAL DATA:  83 y/o  M; dizziness, lightheadedness, and nausea. EXAM: MRI HEAD WITHOUT CONTRAST TECHNIQUE: Multiplanar, multiecho pulse sequences of the brain and surrounding structures were obtained without intravenous contrast. COMPARISON:  10/28/2013 CT head FINDINGS: Brain: Foci of encephalomalacia are present within the left-greater-than-right occipital lobes compatible with chronic infarction. There are small foci of reduced diffusion within the regions of encephalomalacia compatible with acute/ early subacute ischemia which may represent  ongoing or recrudescence of infarction. Background of advanced chronic microvascular ischemic changes and parenchymal volume loss of the brain. Small cortical chronic infarction in the left frontal and left parietal lobes. No significant abnormal susceptibility hypointensity to indicate acute intracranial hemorrhage. No hydrocephalus, focal mass effect, or extra-axial collection. Vascular: Persistent central flow voids. Skull and upper cervical spine: Normal marrow signal. Sinuses/Orbits: No significant abnormal signal of the paranasal sinuses. Small bilateral mastoid air cell effusions. Bilateral intra-ocular lens replacement. Other: None. IMPRESSION: 1. Bilateral occipital encephalomalacia, likely from chronic  infarction. Punctate foci of reduced diffusion within encephalomalacia may represent ongoing or recrudescence of infarction. 2. No evidence for additional acute/early subacute infarct, intracranial hemorrhage, or significant mass effect. 3. Advanced chronic microvascular ischemic changes and parenchymal volume loss of the brain. 4. Small bilateral mastoid effusions. Electronically Signed   By: Kristine Garbe M.D.   On: 12/13/2016 20:15   Dg Chest 2 View  Result Date: 12/13/2016 CLINICAL DATA:  Shortness of breath. Known left-sided malignant mesothelioma EXAM: CHEST  2 VIEW COMPARISON:  12/04/2016 FINDINGS: Cardiac shadow is within normal limits. Opacification of the left hemithorax is again seen stable from the prior exam. Right lung remains clear. No acute bony abnormality is seen. IMPRESSION: Opacification of the left hemithorax secondary to the patient's known mesothelioma. This is stable from the previous exam. Electronically Signed   By: Inez Catalina M.D.   On: 12/13/2016 19:09   Mr Brain Wo Contrast  Result Date: 12/13/2016 CLINICAL DATA:  83 y/o  M; dizziness, lightheadedness, and nausea. EXAM: MRI HEAD WITHOUT CONTRAST TECHNIQUE: Multiplanar, multiecho pulse sequences of the brain and surrounding structures were obtained without intravenous contrast. COMPARISON:  10/28/2013 CT head FINDINGS: Brain: Foci of encephalomalacia are present within the left-greater-than-right occipital lobes compatible with chronic infarction. There are small foci of reduced diffusion within the regions of encephalomalacia compatible with acute/ early subacute ischemia which may represent ongoing or recrudescence of infarction. Background of advanced chronic microvascular ischemic changes and parenchymal volume loss of the brain. Small cortical chronic infarction in the left frontal and left parietal lobes. No significant abnormal susceptibility hypointensity to indicate acute intracranial hemorrhage. No  hydrocephalus, focal mass effect, or extra-axial collection. Vascular: Persistent central flow voids. Skull and upper cervical spine: Normal marrow signal. Sinuses/Orbits: No significant abnormal signal of the paranasal sinuses. Small bilateral mastoid air cell effusions. Bilateral intra-ocular lens replacement. Other: None. IMPRESSION: 1. Bilateral occipital encephalomalacia, likely from chronic infarction. Punctate foci of reduced diffusion within encephalomalacia may represent ongoing or recrudescence of infarction. 2. No evidence for additional acute/early subacute infarct, intracranial hemorrhage, or significant mass effect. 3. Advanced chronic microvascular ischemic changes and parenchymal volume loss of the brain. 4. Small bilateral mastoid effusions. Electronically Signed   By: Kristine Garbe M.D.   On: 12/13/2016 20:15   Ct Biopsy  Result Date: 12/04/2016 INDICATION: 83 year old with diffuse left pleural thickening and large left pleural effusion. Tissue diagnosis is needed. EXAM: CT-GUIDED BIOPSY OF LEFT PLEURAL THICKENING MEDICATIONS: None. ANESTHESIA/SEDATION: Moderate (conscious) sedation was employed during this procedure. A total of Versed 1.0 mg and Fentanyl 50 mcg was administered intravenously. Moderate Sedation Time: 38 minutes. The patient's level of consciousness and vital signs were monitored continuously by radiology nursing throughout the procedure under my direct supervision. FLUOROSCOPY TIME:  None COMPLICATIONS: None immediate. PROCEDURE: Informed written consent was obtained from the patient after a thorough discussion of the procedural risks, benefits and alternatives. All questions were addressed. Maximal Sterile Barrier Technique was utilized including  caps, mask, sterile gowns, sterile gloves, sterile drape, hand hygiene and skin antiseptic. A timeout was performed prior to the initiation of the procedure. Patient was initially placed prone and CT images through the  chest were obtained. Patient was repositioned on his left side in order to make him more comfortable. Additional images through the left lower chest were obtained. Pleural thickening along the posteromedial left lower chest was targeted for biopsy. The back was prepped and draped in sterile fashion. Skin was anesthetized with 1% lidocaine. 17 gauge coaxial needle was directed into the pleural thickening with CT guidance. A total of 4 core biopsies were obtained with an 18 gauge core device. 17 gauge needle was removed without complication. Bandage placed over the puncture site. FINDINGS: Diffuse pleural thickening most prominent in the left lower chest. Near complete collapse of the left lung with a large left pleural effusion. Biopsy needle was placed in the pleural thickening in the posteromedial left lower chest. No evidence for pneumothorax following the procedure. IMPRESSION: Successful CT-guided core biopsies of the pleural thickening in the posteromedial left lower chest. Electronically Signed   By: Markus Daft M.D.   On: 12/04/2016 13:42       I have independently reviewed the above radiologic studies.  Recent Lab Findings: Lab Results  Component Value Date   WBC 4.7 12/21/2016   HGB 12.1 (L) 12/21/2016   HCT 36.5 (L) 12/21/2016   PLT 301 12/21/2016   GLUCOSE 119 12/21/2016   CHOL 128 12/14/2016   TRIG 67 12/14/2016   HDL 29 (L) 12/14/2016   LDLDIRECT 143 (H) 08/09/2016   LDLCALC 86 12/14/2016   ALT 20 12/21/2016   AST 13 12/21/2016   NA 137 12/21/2016   K 3.7 12/21/2016   CL 101 12/15/2016   CREATININE 1.2 12/21/2016   BUN 17.1 12/21/2016   CO2 26 12/21/2016   TSH 2.45 07/18/2016   INR 1.36 12/04/2016   HGBA1C 6.8 (H) 12/14/2016      Assessment / Plan:   With dx of mesothelioma involving left lung and progressive fibrous peal involving lung it is unlikely that any attempt to relieve the left effusion is unlikely to be successfully. Currently patient is not bothered by  symptoms of dyspnea. Discussed palat ive care only with patient and family.         I  spent 30 minutes counseling the patient face to face and 50% or more the  time was spent in counseling and coordination of care. The total time spent in the appointment was 40 minutes.  Grace Isaac MD      [address] [place] [address] Office [phone]   Beeper [phone]  12/21/2016 8:54 PM

## 2016-12-20 NOTE — Telephone Encounter (Signed)
Pts daughter Patriciaann Clan) is calling stating that [facility] has requested pts family to call and let Dr. Yong Channel know that they are giving the pt the blood thinner that he was on before he went in to the hospital and not the one that the hospital had put him on due to the extreme cost of $138.  Daughter will explain when they come in for appointment on Friday 12/22/16.

## 2016-12-20 NOTE — Telephone Encounter (Signed)
Called and confirmed 05/23/16 clinic appt w/ patient's daughter Patriciaann Clan.

## 2016-12-20 NOTE — Patient Outreach (Signed)
[facility]) Care Management  12/20/2016  Ladd Cen 1925-08-23 [id number]  EMMI-Stroke  RED ON EMMI ALERT DAY#:1 DATE: 12/20/16 RED ALERT: Questions or problems with meds? Yes.   Outreach attempt #  1 spoke with patient's daughter Patriciaann Clan). ROI on file. Patient's daughter stated, patient went to the [facility] hospital today for an appointment. Discussed red alert with daughter. Daughter reported, patient couldn't afford his blood thinner prescribed at discharge. Daughter verbalized, patient continued to take his previous prescribed blood thinner. Patient/Daughter didn't inform the MD of changes. Encouraged daughter to contact MD to inform of decision to continue to using previous prescribed blood thinner. Advised daughter that they would continue to get automated EMMI-Stroke post discharge calls to assess how they are doing following recent hospitalization and will receive a call from a nurse if any of their responses were abnormal. Daughter voiced understanding and was appreciative of f/u call.    Plan: RN CM will notify [facility] CM administrative assistant regarding case closure.    Lake Bells, RN, BSN, MHA/MSL, [place] Telephonic Care Manager Coordinator [facility] Direct Phone: [phone] Toll Free: [phone] Fax: [phone]

## 2016-12-21 ENCOUNTER — Encounter: Payer: Self-pay | Admitting: *Deleted

## 2016-12-21 ENCOUNTER — Institutional Professional Consult (permissible substitution) (INDEPENDENT_AMBULATORY_CARE_PROVIDER_SITE_OTHER): Payer: Medicare Other | Admitting: Cardiothoracic Surgery

## 2016-12-21 ENCOUNTER — Ambulatory Visit: Payer: Medicare Other | Attending: Internal Medicine | Admitting: Physical Therapy

## 2016-12-21 ENCOUNTER — Telehealth: Payer: Self-pay | Admitting: Internal Medicine

## 2016-12-21 ENCOUNTER — Encounter: Payer: Self-pay | Admitting: Internal Medicine

## 2016-12-21 ENCOUNTER — Other Ambulatory Visit (HOSPITAL_BASED_OUTPATIENT_CLINIC_OR_DEPARTMENT_OTHER): Payer: Medicare Other

## 2016-12-21 ENCOUNTER — Other Ambulatory Visit: Payer: Self-pay | Admitting: *Deleted

## 2016-12-21 ENCOUNTER — Ambulatory Visit (HOSPITAL_BASED_OUTPATIENT_CLINIC_OR_DEPARTMENT_OTHER): Payer: Medicare Other | Admitting: Internal Medicine

## 2016-12-21 VITALS — BP 143/86 | HR 74 | Temp 98.8°F | Resp 17 | Wt 166.3 lb

## 2016-12-21 VITALS — BP 143/86 | HR 74 | Temp 98.8°F | Resp 17 | Ht 71.0 in | Wt 166.3 lb

## 2016-12-21 DIAGNOSIS — J90 Pleural effusion, not elsewhere classified: Secondary | ICD-10-CM | POA: Diagnosis not present

## 2016-12-21 DIAGNOSIS — J9819 Other pulmonary collapse: Secondary | ICD-10-CM | POA: Diagnosis not present

## 2016-12-21 DIAGNOSIS — C459 Mesothelioma, unspecified: Secondary | ICD-10-CM | POA: Diagnosis not present

## 2016-12-21 DIAGNOSIS — Z9181 History of falling: Secondary | ICD-10-CM | POA: Diagnosis present

## 2016-12-21 DIAGNOSIS — Z5111 Encounter for antineoplastic chemotherapy: Secondary | ICD-10-CM

## 2016-12-21 DIAGNOSIS — Z7189 Other specified counseling: Secondary | ICD-10-CM

## 2016-12-21 DIAGNOSIS — R2681 Unsteadiness on feet: Secondary | ICD-10-CM

## 2016-12-21 DIAGNOSIS — I10 Essential (primary) hypertension: Secondary | ICD-10-CM | POA: Diagnosis not present

## 2016-12-21 DIAGNOSIS — C450 Mesothelioma of pleura: Secondary | ICD-10-CM | POA: Diagnosis not present

## 2016-12-21 LAB — COMPREHENSIVE METABOLIC PANEL
ALT: 20 U/L (ref 0–55)
ANION GAP: 9 meq/L (ref 3–11)
AST: 13 U/L (ref 5–34)
Albumin: 3.2 g/dL — ABNORMAL LOW (ref 3.5–5.0)
Alkaline Phosphatase: 97 U/L (ref 40–150)
BUN: 17.1 mg/dL (ref 7.0–26.0)
CHLORIDE: 102 meq/L (ref 98–109)
CO2: 26 meq/L (ref 22–29)
Calcium: 9.6 mg/dL (ref 8.4–10.4)
Creatinine: 1.2 mg/dL (ref 0.7–1.3)
EGFR: 61 mL/min/{1.73_m2} — AB (ref >90)
Glucose: 119 mg/dl (ref 70–140)
POTASSIUM: 3.7 meq/L (ref 3.5–5.1)
Sodium: 137 mEq/L (ref 136–145)
Total Bilirubin: 0.51 mg/dL (ref 0.20–1.20)
Total Protein: 7.9 g/dL (ref 6.4–8.3)

## 2016-12-21 LAB — CBC WITH DIFFERENTIAL/PLATELET
BASO%: 0.3 % (ref 0.0–2.0)
Basophils Absolute: 0 10*3/uL (ref 0.0–0.1)
EOS ABS: 0.1 10*3/uL (ref 0.0–0.5)
EOS%: 2.1 % (ref 0.0–7.0)
HCT: 36.5 % — ABNORMAL LOW (ref 38.4–49.9)
HGB: 12.1 g/dL — ABNORMAL LOW (ref 13.0–17.1)
LYMPH#: 0.6 10*3/uL — AB (ref 0.9–3.3)
LYMPH%: 13.3 % — ABNORMAL LOW (ref 14.0–49.0)
MCH: 28.6 pg (ref 27.2–33.4)
MCHC: 33 g/dL (ref 32.0–36.0)
MCV: 86.6 fL (ref 79.3–98.0)
MONO#: 0.9 10*3/uL (ref 0.1–0.9)
MONO%: 20 % — AB (ref 0.0–14.0)
NEUT#: 3 10*3/uL (ref 1.5–6.5)
NEUT%: 64.3 % (ref 39.0–75.0)
PLATELETS: 301 10*3/uL (ref 140–400)
RBC: 4.22 10*6/uL (ref 4.20–5.82)
RDW: 15.4 % — AB (ref 11.0–14.6)
WBC: 4.7 10*3/uL (ref 4.0–10.3)

## 2016-12-21 NOTE — Progress Notes (Signed)
[place] [phone]   [phone] Multidisciplinary thoracic oncology clinic  CONSULT NOTE  REFERRING PHYSICIAN:Dr. Kara Mead  REASON FOR CONSULTATION:  83 years old white male recently diagnosed with malignant pleural mesothelioma.  HPI Thomas [name] is a 83 y.o. male was past medical history significant for hypertension, GERD, history of prostate cancer status post radiotherapy 10 years ago, borderline diabetes mellitus, history of esophageal stricture as well as carotid artery occlusion status post endarterectomy. The patient mentioned that he was complaining of cough and increasing shortness of breath since June 2017. CT scan of the chest on 02/10/2016 showed moderate to large left pleural effusion. The patient underwent ultrasound left thoracentesis on 02/21/2016 with drainage of 1.5 L of pleural fluid and the cytology showed atypical cells. He was followed by observation and repeat CT scan of the chest on 10/27/2016 showed large pleura fluid collection in the left hemithorax with second pleural service suggested an by anemia or malignant effusion. There was minimal irradiation of the left upper lobe and left lower lobe. There was also multiple small mediastinal lymph nodes. A PET scan performed on 11/13/2016 showed the large complex left pleural effusion demonstrate no surrounding second hypermetabolic and worrisome for malignancy. There is no dominant mass also the hypermetabolic activity is greatest posteromedially. There was near complete collapse of the left lung without apparent pulmonary parenchymal hypermetabolic activity. There was nonspecific mildly hypermetabolic small lymph nodes in the mediastinum and both hila potentially reactive. There was no evidence of distant metastasis. Dr. Elsworth Soho ordered CT guided biopsy of the left pleural thickening which was performed by interventional radiology on 12/04/2016. The final pathology [id number])  showed malignant mesothelioma, epithelioid type. The patient was complaining of dizziness spells and MRI of the brain showed bilateral occipital encephalomalacia likely from chronic infarction but no evidence of additional acute/early subacute infarct or intra-cranial metastasis or hemorrhage. Dr. Elsworth Soho kindly referred the patient to the multidisciplinary thoracic oncology clinic today for further evaluation and recommendation regarding treatment of his condition. When seen today the patient is feeling fine except for the occasional dizzy spells and some recent falls. He denied having any chest pain but has shortness breath with exertion was no cough or hemoptysis. He denied having any recent weight loss or night sweats. He has no nausea, vomiting, diarrhea or constipation. He has no fever or chills. Family history significant for father died from heart disease and old age, mother had cancer and sister had breast cancer as well as brother with lung cancer. The patient is better at and has 6 children. One deceased. He is a Actor. He had exposure to asbestos when he was in the [employer]. Has remote history of smoking but quit 15 years ago. He drinks alcohol on daily basis. No history of drug abuse.   HPI  Past Medical History:  Diagnosis Date  . Adenomatous colon polyp 2009  . Allergy   . Carotid artery occlusion   . Diverticulosis of colon (without mention of hemorrhage)   . ED (erectile dysfunction)   . Esophageal stricture   . Esophagitis   . GERD (gastroesophageal reflux disease)   . Hiatal hernia   . Hypertension   . Prostate cancer ([place])   . Unspecified gastritis and gastroduodenitis without mention of hemorrhage     Past Surgical History:  Procedure Laterality Date  . ENDARTERECTOMY Left 03/05/2013   Procedure: ENDARTERECTOMY CAROTID;  Surgeon: Conrad Yarnell, MD;  Location: [place];  Service: Vascular;  Laterality: Left;  . ESOPHAGOGASTRODUODENOSCOPY (EGD) WITH ESOPHAGEAL DILATION      . PROSTATE SURGERY     Laser surgery    Family History  Problem Relation Age of Onset  . Heart disease Father        unclear specifics  . Cancer Mother   . Breast cancer Sister   . Hyperlipidemia Daughter   . Hypertension Daughter   . Hypertension Son     Social History Social History  Substance Use Topics  . Smoking status: Never Smoker  . Smokeless tobacco: Never Used  . Alcohol use 0.0 oz/week     Comment: daily--one alcohol drink a day    Allergies  Allergen Reactions  . Ace Inhibitors Hives  . Penicillins Swelling    Swelling  of the face. Has patient had a PCN reaction causing immediate rash, facial/tongue/throat swelling, SOB or lightheadedness with hypotension: YES Has patient had a PCN reaction causing severe rash involving mucus membranes or skin necrosis: NO Has patient had a PCN reaction that required hospitalizationNO Has patient had a PCN reaction occurring within the last 10 years: NO If all of the above answers are "NO", then may proceed with Cephalosporin use.    Current Outpatient Prescriptions  Medication Sig Dispense Refill  . amLODipine (NORVASC) 5 MG tablet Take 1 tablet (5 mg total) by mouth daily. 30 tablet 3  . cloNIDine (CATAPRES) 0.1 MG tablet TAKE ONE TABLET BY MOUTH THREE TIMES DAILY 90 tablet 4  . dabigatran (PRADAXA) 75 MG CAPS capsule Take 1 capsule (75 mg total) by mouth every 12 (twelve) hours. 60 capsule 0  . glucose monitoring kit (FREESTYLE) monitoring kit 1 each by Does not apply route 4 (four) times daily - after meals and at bedtime. 1 month Diabetic Testing Supplies for QAC-QHS accuchecks. 1 each 1  . meclizine (ANTIVERT) 25 MG tablet Take 1 tablet (25 mg total) by mouth 3 (three) times daily as needed for dizziness. 30 tablet 0  . metoCLOPramide (REGLAN) 5 MG tablet Take 1 tablet (5 mg total) by mouth every 8 (eight) hours as needed for nausea. 15 tablet 0  . rosuvastatin (CRESTOR) 10 MG tablet Take 1 tablet (10 mg total) by mouth  daily. 92 tablet 3   No current facility-administered medications for this visit.     Review of Systems  Constitutional: positive for fatigue Eyes: negative Ears, nose, mouth, throat, and face: negative Respiratory: positive for dyspnea on exertion Cardiovascular: negative Gastrointestinal: negative Genitourinary:negative Integument/breast: negative Hematologic/lymphatic: negative Musculoskeletal:negative Neurological: positive for dizziness Behavioral/Psych: negative Endocrine: negative Allergic/Immunologic: negative  Physical Exam  LLV:DIXVE, healthy, no distress, well nourished and well developed SKIN: skin color, texture, turgor are normal, no rashes or significant lesions HEAD: Normocephalic, No masses, lesions, tenderness or abnormalities EYES: normal, PERRLA, Conjunctiva are pink and non-injected EARS: External ears normal, Canals clear OROPHARYNX:no exudate, no erythema and lips, buccal mucosa, and tongue normal  NECK: supple, no adenopathy, no JVD LYMPH:  no palpable lymphadenopathy, no hepatosplenomegaly LUNGS: Clear to auscultation on the right, absent breath sounds and dullness to percussion on the left. HEART: regular rate & rhythm, no murmurs and no gallops ABDOMEN:abdomen soft, non-tender, normal bowel sounds and no masses or organomegaly BACK: Back symmetric, no curvature., No CVA tenderness EXTREMITIES:no joint deformities, effusion, or inflammation, no edema, no skin discoloration  NEURO: alert & oriented x 3 with fluent speech, no focal motor/sensory deficits  PERFORMANCE STATUS: ECOG 1  LABORATORY DATA: Lab Results  Component Value Date  WBC 4.7 12/21/2016   HGB 12.1 (L) 12/21/2016   HCT 36.5 (L) 12/21/2016   MCV 86.6 12/21/2016   PLT 301 12/21/2016      Chemistry      Component Value Date/Time   NA 135 12/15/2016 0744   K 3.5 12/15/2016 0744   CL 101 12/15/2016 0744   CO2 24 12/15/2016 0744   BUN 10 12/15/2016 0744   CREATININE 1.22  12/15/2016 0744   CREATININE 1.35 (H) 09/27/2016 1037      Component Value Date/Time   CALCIUM 8.9 12/15/2016 0744   ALKPHOS 77 12/13/2016 2036   AST 33 12/13/2016 2036   ALT 21 12/13/2016 2036   BILITOT 2.1 (H) 12/13/2016 2036       RADIOGRAPHIC STUDIES: Dg Chest 2 View  Result Date: 12/13/2016 CLINICAL DATA:  Shortness of breath. Known left-sided malignant mesothelioma EXAM: CHEST  2 VIEW COMPARISON:  12/04/2016 FINDINGS: Cardiac shadow is within normal limits. Opacification of the left hemithorax is again seen stable from the prior exam. Right lung remains clear. No acute bony abnormality is seen. IMPRESSION: Opacification of the left hemithorax secondary to the patient's known mesothelioma. This is stable from the previous exam. Electronically Signed   By: Inez Catalina M.D.   On: 12/13/2016 19:09   Mr Brain Wo Contrast  Result Date: 12/13/2016 CLINICAL DATA:  83 y/o  M; dizziness, lightheadedness, and nausea. EXAM: MRI HEAD WITHOUT CONTRAST TECHNIQUE: Multiplanar, multiecho pulse sequences of the brain and surrounding structures were obtained without intravenous contrast. COMPARISON:  10/28/2013 CT head FINDINGS: Brain: Foci of encephalomalacia are present within the left-greater-than-right occipital lobes compatible with chronic infarction. There are small foci of reduced diffusion within the regions of encephalomalacia compatible with acute/ early subacute ischemia which may represent ongoing or recrudescence of infarction. Background of advanced chronic microvascular ischemic changes and parenchymal volume loss of the brain. Small cortical chronic infarction in the left frontal and left parietal lobes. No significant abnormal susceptibility hypointensity to indicate acute intracranial hemorrhage. No hydrocephalus, focal mass effect, or extra-axial collection. Vascular: Persistent central flow voids. Skull and upper cervical spine: Normal marrow signal. Sinuses/Orbits: No significant abnormal  signal of the paranasal sinuses. Small bilateral mastoid air cell effusions. Bilateral intra-ocular lens replacement. Other: None. IMPRESSION: 1. Bilateral occipital encephalomalacia, likely from chronic infarction. Punctate foci of reduced diffusion within encephalomalacia may represent ongoing or recrudescence of infarction. 2. No evidence for additional acute/early subacute infarct, intracranial hemorrhage, or significant mass effect. 3. Advanced chronic microvascular ischemic changes and parenchymal volume loss of the brain. 4. Small bilateral mastoid effusions. Electronically Signed   By: Kristine Garbe M.D.   On: 12/13/2016 20:15   Ct Biopsy  Result Date: 12/04/2016 INDICATION: 83 year old with diffuse left pleural thickening and large left pleural effusion. Tissue diagnosis is needed. EXAM: CT-GUIDED BIOPSY OF LEFT PLEURAL THICKENING MEDICATIONS: None. ANESTHESIA/SEDATION: Moderate (conscious) sedation was employed during this procedure. A total of Versed 1.0 mg and Fentanyl 50 mcg was administered intravenously. Moderate Sedation Time: 38 minutes. The patient's level of consciousness and vital signs were monitored continuously by radiology nursing throughout the procedure under my direct supervision. FLUOROSCOPY TIME:  None COMPLICATIONS: None immediate. PROCEDURE: Informed written consent was obtained from the patient after a thorough discussion of the procedural risks, benefits and alternatives. All questions were addressed. Maximal Sterile Barrier Technique was utilized including caps, mask, sterile gowns, sterile gloves, sterile drape, hand hygiene and skin antiseptic. A timeout was performed prior to the initiation of the procedure. Patient  was initially placed prone and CT images through the chest were obtained. Patient was repositioned on his left side in order to make him more comfortable. Additional images through the left lower chest were obtained. Pleural thickening along the  posteromedial left lower chest was targeted for biopsy. The back was prepped and draped in sterile fashion. Skin was anesthetized with 1% lidocaine. 17 gauge coaxial needle was directed into the pleural thickening with CT guidance. A total of 4 core biopsies were obtained with an 18 gauge core device. 17 gauge needle was removed without complication. Bandage placed over the puncture site. FINDINGS: Diffuse pleural thickening most prominent in the left lower chest. Near complete collapse of the left lung with a large left pleural effusion. Biopsy needle was placed in the pleural thickening in the posteromedial left lower chest. No evidence for pneumothorax following the procedure. IMPRESSION: Successful CT-guided core biopsies of the pleural thickening in the posteromedial left lower chest. Electronically Signed   By: Markus Daft M.D.   On: 12/04/2016 13:42   Mr Jodene Nam Head/brain RC Cm  Result Date: 12/14/2016 CLINICAL DATA:  83 year old male with dizziness. EXAM: MRA HEAD WITHOUT CONTRAST TECHNIQUE: Angiographic images of the Circle of Willis were obtained using MRA technique without intravenous contrast. COMPARISON:  Brain MRI 12/13/2016.  Head CT 10/28/2013 FINDINGS: Antegrade flow in the posterior circulation but severe irregularity and multifocal high-grade stenosis in the dominant distal right vertebral artery (series 305, image 9). The right PICA origin remains patent. The non dominant appearing distal left vertebral artery is less irregular and terminates in PICA. Similar moderate to severe basilar artery irregularity, and mid basilar stenosis which is at least moderate (same image). Distal basilar remains patent as do the bilateral SCA and PCA origins. Moderate to severe bilateral PCA irregularity and stenosis, with distal flow more attenuated on the left. Posterior communicating arteries are diminutive or absent. Antegrade flow in both ICA siphons. Tortuous distal cervical right ICA. Moderate to severe  regularity and severe stenosis of the right ICA cavernous segment (series 303, image 10). A laterally directed 5 mm lesion from the distal cavernous segment is probably an atherosclerotic pseudo lesion rather than an aneurysm. Moderate irregularity but no significant stenosis of the left ICA siphon. Both ophthalmic artery origins remain patent. Moderate irregularity also of the supraclinoid right ICA. Both carotid termini remain patent. Mild to moderate irregularity at the bilateral MCA and ACA origins. Moderate irregularity and stenosis of the right A1 segment. Anterior communicating artery and visible ACA branches are within normal limits. Moderate irregularity and mild bilateral MCA M1 segment stenosis. Both MCA bifurcations remain patent. No MCA branch occlusion is identified. There is moderate stenosis at the right MCA bifurcation. IMPRESSION: 1. Negative for large vessel occlusion but positive for severe intracranial atherosclerosis. 2. Severe stenoses of the distal Right Vertebral Artery, Right ICA siphon, and bilateral PCAs (note chronic bilateral PCA infarcts). 3. Moderate stenoses of the mid Basilar artery, Right ACA A1 segment, and Right MCA bifurcation. Electronically Signed   By: Genevie Ann M.D.   On: 12/14/2016 11:45    ASSESSMENT: This is a very pleasant 83 years old white male recently diagnosed with left malignant pleural mesothelioma in April 2018. The patient had recurrent pleural effusion and the left side with collapse of the left lung which is unlikely to resolve after the drainage of the left pleural effusion.  PLAN: I had a lengthy discussion with the patient and his family today about his current disease condition and  treatment options. I discussed with the patient and his family the goals of care and he understands that he has incurable condition. I gave the patient the option of treatment with palliative systemic chemotherapy with reduced dose carboplatin and Alimta because of his age  versus palliative care and hospice referral. I discussed with the patient adverse effect of the chemotherapy including but not limited to alopecia, myelosuppression, nausea and vomiting, peripheral neuropathy, liver or renal dysfunction. After lengthy discussion of these options the patient is not interested in consideration of systemic therapy. He would like to continue with palliative care. He will see Dr. Servando Snare later today for evaluation and discussion of need for Pleurx catheter placement. He was seen during the multidisciplinary thoracic oncology clinic today by medical oncology, thoracic surgery, thoracic navigator, social worker and physical therapist.  The patient will continue on observation and palliative care for now and hospice can be called by his primary care physician. He was advised to call if he has any concerning symptoms in the interval. The patient voices understanding of current disease status and treatment options and is in agreement with the current care plan. All questions were answered. The patient knows to call the clinic with any problems, questions or concerns. We can certainly see the patient much sooner if necessary. Thank you so much for allowing me to participate in the care of Thomas [name]. I will continue to follow up the patient with you and assist in his care.  I spent 40 minutes counseling the patient face to face. The total time spent in the appointment was 60 minutes.  Disclaimer: This note was dictated with voice recognition software. Similar sounding words can inadvertently be transcribed and may not be corrected upon review.   Alverta Caccamo K. Dec 21, 2016, 2:59 PM

## 2016-12-21 NOTE — Telephone Encounter (Signed)
Dr. Leonie Man,   Thanks for takign care of [name] in the hospital. Patient could not afford the pradaxa so went back on eliquis it appears. I wanted to see if you had any advice here such as option of transitioning to coumadin (wonder if could be hard outpatient with potential costs of levaquin as well). Or do you know of any programs to help lower the cost.   Garret Reddish

## 2016-12-21 NOTE — Telephone Encounter (Signed)
Per 5/10 LOS No follow-up appointment is needed.

## 2016-12-21 NOTE — Progress Notes (Signed)
Oncology Nurse Navigator Documentation  Oncology Nurse Navigator Flowsheets 12/21/2016  Navigator Location [facility]  Navigator Encounter Type Clinic/MDC/spoke with patient and family today.  Gave information on cancer, resources and support at [facility].  Thomas [name] would like to think about treatment.  Will follow up with him at a later time.   Patient Visit Type MedOnc  Treatment Phase Pre-Tx/Tx Discussion  Barriers/Navigation Needs Education  Education Newly Diagnosed Cancer Education;Understanding Cancer/ Treatment Options  Interventions Education  Education Method Verbal;Written  Acuity Level 2  Time Spent with Patient 48

## 2016-12-21 NOTE — Telephone Encounter (Signed)
Prescription faxed to [facility] as requested

## 2016-12-21 NOTE — Therapy (Signed)
[place], [place], [address] Phone: [phone]   Fax:  [phone]  Physical Therapy Evaluation  Patient Details  Name: Thomas [name] MRN: [id number] Date of Birth: 1926/01/21 Referring Provider: Dr. Curt Bears  Encounter Date: 12/21/2016      PT End of Session - 12/21/16 1654    Visit Number 1   Number of Visits 1   PT Start Time 9628   PT Stop Time 1608   PT Time Calculation (min) 20 min   Activity Tolerance Patient tolerated treatment well   Behavior During Therapy [facility] for tasks assessed/performed      Past Medical History:  Diagnosis Date  . Adenomatous colon polyp 2009  . Allergy   . Carotid artery occlusion   . Diverticulosis of colon (without mention of hemorrhage)   . ED (erectile dysfunction)   . Esophageal stricture   . Esophagitis   . GERD (gastroesophageal reflux disease)   . Hiatal hernia   . Hypertension   . Prostate cancer ([place])   . Unspecified gastritis and gastroduodenitis without mention of hemorrhage     Past Surgical History:  Procedure Laterality Date  . ENDARTERECTOMY Left 03/05/2013   Procedure: ENDARTERECTOMY CAROTID;  Surgeon: Conrad Osceola, MD;  Location: [place];  Service: Vascular;  Laterality: Left;  . ESOPHAGOGASTRODUODENOSCOPY (EGD) WITH ESOPHAGEAL DILATION    . PROSTATE SURGERY     Laser surgery    There were no vitals filed for this visit.       Subjective Assessment - 12/21/16 1640    Subjective Was recently hospitalized for TIA. Has had less dizziness recently than before that, when he fell a couple of times.   Patient is accompained by: Family member  daughter and two sons   Pertinent History Pt. presented with shortness of breath and was found to have a large pleural effusion.  Subsequent testing revealed this to be malignant mesothelioma.  Brain MR was negative.  As of today in multidisciplinary thoracic oncology clinic, patient has  decided to do no treatment. 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  Prostate CA, carotid artery occlusion, HTN, ETOH, type 2 DM.   Patient Stated Goals get info from lung clinic providers   Currently in Pain? No/denies            [facility] PT Assessment - 12/21/16 0001      Assessment   Medical Diagnosis malignant mesothelioma   Referring Provider Dr. Curt Bears   Onset Date/Surgical Date 10/27/16   Prior Therapy none, but family says he may start home health PT soon     Precautions   Precautions Fall;Other (comment)   Precaution Comments cancer precautions     Restrictions   Weight Bearing Restrictions No     Balance Screen   Has the patient fallen in the past 6 months Yes   How many times? 2   Has the patient had a decrease in activity level because of a fear of falling?  No   Is the patient reluctant to leave their home because of a fear of falling?  No     Home Environment   Living Environment Private residence   Living Arrangements Spouse/significant other;Children  youngest son (adult)   Type of [place] to enter   Entrance Stairs-Number of Steps 2   Entrance Stairs-Rails --  yes   Home Layout One level  Prior Function   Level of Independence Requires assistive device for independence   Leisure says he walks some around his cul [place], but not regularly     Cognition   Overall Cognitive Status Within Functional Limits for tasks assessed     Observation/Other Assessments   Observations seems hard of hearing; tall, thin gentleman with three children present     Functional Tests   Functional tests Sit to Stand     Sit to Stand   Comments 8 times in 30 seconds, about average for his age  mild dyspnea following     Posture/Postural Control   Posture/Postural Control Postural limitations   Postural Limitations Forward head;Increased thoracic kyphosis;Flexed trunk  trunk flexion is mild     ROM /  Strength   AROM / PROM / Strength AROM     AROM   Overall AROM  Deficits   Overall AROM Comments In standing, active trunk flexion--reaches fingertips 14 inches to floor; extensnion 75% loss; sidebend 10% loss bilat., rotation 10% loss bilat.     Ambulation/Gait   Ambulation/Gait Yes   Ambulation/Gait Assistance 6: Modified independent (Device/Increase time)   Ambulation Distance (Feet) 8 Feet   Assistive device --  none for assessment, but uses a straight cane ("Hurry cane")   Gait Pattern Right steppage;Left steppage  possibly/slight   Ambulation Surface Level     Balance   Balance Assessed Yes     Dynamic Standing Balance   Dynamic Standing - Comments reaches forward 9 inches in standing, perhaps a bit low for age                           PT Education - 12/21/16 1654    Education provided Yes   Education Details posture, breathing, walking, energy conservation   Person(s) Educated Patient;Child(ren)   Methods Explanation;Handout   Comprehension Verbalized understanding               Lung Clinic Goals - 12/21/16 1700      Patient will be able to verbalize understanding of the benefit of exercise to decrease fatigue.   Status Achieved     Patient will be able to verbalize the importance of posture.   Status Achieved     Patient will be able to demonstrate diaphragmatic breathing for improved lung function.   Status Achieved     Patient will be able to verbalize understanding of the role of physical therapy to prevent functional decline and who to contact if physical therapy is needed.   Status Achieved             Plan - 12/21/16 1654    Clinical Impression Statement This is an 83 year-old gentleman with mesothelioma who has decided not to undergo chemotherapy.  He does have some physical limitations including decreased trunk ROM, impaired balance, gait abnormality, and h/o falls.  Eval is moderate today with comorbidities including  recent TIA, vertigo and type 2 DM.   Rehab Potential Fair   PT Frequency One time visit   PT Treatment/Interventions Patient/family education   PT Next Visit Plan None at this time; family thinks he will be starting home health PT following a recent hospitalization for TIA   PT Home Exercise Plan breathing exercise, walking   Consulted and Agree with Plan of Care Patient;Family member/caregiver      Patient will benefit from skilled therapeutic intervention in order to improve the following deficits and impairments:  Abnormal gait, Decreased balance, Decreased range of motion  Visit Diagnosis: Unsteadiness on feet - Plan: PT plan of care cert/re-cert  History of falling - Plan: PT plan of care cert/re-cert      G-Codes - 69/67/89 1700    Functional Assessment Tool Used (Outpatient Only) clinical judgement   Functional Limitation Mobility: Walking and moving around   Mobility: Walking and Moving Around Current Status (F8101) At least 20 percent but less than 40 percent impaired, limited or restricted   Mobility: Walking and Moving Around Goal Status [phone]) At least 20 percent but less than 40 percent impaired, limited or restricted   Mobility: Walking and Moving Around Discharge Status [phone]) At least 20 percent but less than 40 percent impaired, limited or restricted       Problem List Patient Active Problem List   Diagnosis Date Noted  . Vertigo following cerebrovascular accident 12/16/2016  . Hyperkalemia 12/16/2016  . Type 2 diabetes mellitus with hyperlipidemia ([place]) 12/16/2016  . Acute cerebral infarction ([place])   . Insomnia 12/07/2016  . Atelectasis 10/30/2016  . Alcohol abuse 08/04/2016  . Permanent atrial fibrillation ([place]) 07/21/2016  . Malignant mesothelioma of pleura ([place]) 02/16/2016  . Perforation of right tympanic membrane 11/24/2013  . Carotid stenosis 02/21/2013  . TIA (transient ischemic attack) 02/17/2013  . GERD (gastroesophageal reflux disease)  05/17/2011  . Esophageal dysphagia 05/17/2011  . Stricture and stenosis of esophagus 08/29/2010  . Allergic rhinitis 01/16/2008  . 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 PROSTATE CANCER, HX OF 03/26/2007  . Hyperlipidemia 03/20/2007  . Essential hypertension 03/20/2007    SALISBURY,DONNA 12/21/2016, 5:03 PM  [facility] [place], [place], [address] Phone: [phone]   Fax:  [phone]  Name: Devaris Quirk MRN: [id number] Date of Birth: [birth year]  Serafina Royals, PT 12/21/16 5:03 PM

## 2016-12-21 NOTE — Progress Notes (Signed)
Agenda Clinical Social Work  Clinical Social Work met with patient/family and Futures trader at [facility] appointment to offer support and assess for psychosocial needs.  Medical oncologist reviewed patient's diagnosis and recommended treatment plan with patient/family.  Thomas [name] was accompanied by his three children.  The patient is married and lives with his spouse and youngest son.  He served in [employer] prior to having children and has been married to his spouse for 62 years.  [name] stated he is not interested in chemotherapy treatment at this time and reflected back on a full life with no regrets.  The patient scored a 5 on the distress thermometer and has no concerns at this time.  ONCBCN DISTRESS SCREENING 12/21/2016  Distress experienced in past week (1-10) 5    Clinical Social Work briefly discussed [place] Work role and [employer] support programs/services.  Clinical Social Work encouraged patient to call with any additional questions or concerns.   Maryjean Morn, MSW, LCSW, OSW-C Clinical Social Worker [facility] [phone]

## 2016-12-22 ENCOUNTER — Ambulatory Visit (INDEPENDENT_AMBULATORY_CARE_PROVIDER_SITE_OTHER): Payer: Medicare Other | Admitting: Family Medicine

## 2016-12-22 ENCOUNTER — Encounter: Payer: Self-pay | Admitting: Family Medicine

## 2016-12-22 DIAGNOSIS — E785 Hyperlipidemia, unspecified: Secondary | ICD-10-CM

## 2016-12-22 DIAGNOSIS — E1169 Type 2 diabetes mellitus with other specified complication: Secondary | ICD-10-CM | POA: Diagnosis not present

## 2016-12-22 DIAGNOSIS — R42 Dizziness and giddiness: Secondary | ICD-10-CM

## 2016-12-22 DIAGNOSIS — I69398 Other sequelae of cerebral infarction: Secondary | ICD-10-CM | POA: Diagnosis not present

## 2016-12-22 DIAGNOSIS — C450 Mesothelioma of pleura: Secondary | ICD-10-CM | POA: Diagnosis not present

## 2016-12-22 NOTE — Patient Instructions (Addendum)
We will call you within a week or two about your referral to palliative care. If you do not hear within 3 weeks, give [place] a call.   For diabetes, want to keep a1c under 7.5. You were at 6.8- I am not going to put you on medicine. Can check sugar once a week- goal if fasting under 150, if haven eaten something under 180.   Would call Dr. Leonie Man for follow up in about 4 weeks [phone]  I am also going ot message him again about the eliquis vs. pradaxa issue.   Your vertigo could be coming from the stroke you had. I am glad it is improving. May continue to use meclizine as needed. I also wonder about another form of vertigo that is more common but with everything you have going on- do not think the rehab for that would be ideal

## 2016-12-22 NOTE — Assessment & Plan Note (Addendum)
S: patient hospitalized form 12/13/16 to 5/518 with stroke thought due to atrial fibrillation potentially. He also has recently diagnosed meothelioma. He presented with dizziness. MRI showed possible occiptal area stroke. It was thought his dizziness was related to this. Carotid dopplers without significant stenosis and normal EF and no cardiac source of emboli on carotid doppler. He was changed to pradaxa from eliquis by stroke team but [facility] due to [facility] and loewr risk of bleeding  Luckily no weakness at time of discharge- was sent home with home health PT. We have recently sent in hospital bed as well.   Patient states his symptoms have improved each day but still has daily symptoms. Seems more like vertigo- states room starts to slowly move in front of him often when he is seated for a period- longest episode has been 3 minutes but as short as a minute as well. States his equilibrium is off and if happens with standing- really depends on his cane or sitting down quickly. Happens about once an hour A/P: I assume his dizziness/vertigo spells are stroke related. They are not with predictable head movements (doubt BPPV) I think its odd that they are not more persistent than they are though. In regards to prevention- he was changed to pradaxa by stroke team but back to eliquis by primary care at [facility] (due to lower bleeding risk it sounds like)- does not appear cost is main concern.   Also considered increasing crestor to get LDL under 70 as currently in 80s. From notes in hospital- seems embolic stroke thought most likely so will continue to monitor for now and continue crestor 10mg  only. At next visit could consider discussion of increasing to 20mg  crestor.

## 2016-12-22 NOTE — Progress Notes (Signed)
Subjective:  Thomas [name] is a 83 y.o. year old very pleasant male patient who presents for/with See problem oriented charting ROS- vertigo. No headaches or blurry vision. No extremity weakness. No falls- relying on cane.    Past Medical History-  Patient Active Problem List   Diagnosis Date Noted  . Vertigo following cerebrovascular accident 12/16/2016    Priority: High  . Type 2 diabetes mellitus with hyperlipidemia ([place]) 12/16/2016    Priority: High  . Permanent atrial fibrillation ([place]) 07/21/2016    Priority: High  . Malignant mesothelioma of pleura ([place]) 02/16/2016    Priority: High  . Carotid stenosis 02/21/2013    Priority: High  . TIA (transient ischemic attack) 02/17/2013    Priority: High  . PROSTATE CANCER, HX OF 03/26/2007    Priority: High  . Alcohol abuse 08/04/2016    Priority: Medium  . GERD (gastroesophageal reflux disease) 05/17/2011    Priority: Medium  . Hyperlipidemia 03/20/2007    Priority: Medium  . Essential hypertension 03/20/2007    Priority: Medium  . Perforation of right tympanic membrane 11/24/2013    Priority: Low  . Esophageal dysphagia 05/17/2011    Priority: Low  . Stricture and stenosis of esophagus 08/29/2010    Priority: Low  . Allergic rhinitis 01/16/2008    Priority: Low  . ERECTILE DYSFUNCTION, ORGANIC 03/26/2007    Priority: Low  . Hyperkalemia 12/16/2016  . Insomnia 12/07/2016  . Atelectasis 10/30/2016    Medications- reviewed and updated Current Outpatient Prescriptions  Medication Sig Dispense Refill  . amLODipine (NORVASC) 5 MG tablet Take 1 tablet (5 mg total) by mouth daily. 30 tablet 3  . apixaban (ELIQUIS) 5 MG TABS tablet Take 5 mg by mouth 2 (two) times daily.    . cloNIDine (CATAPRES) 0.1 MG tablet TAKE ONE TABLET BY MOUTH THREE TIMES DAILY 90 tablet 4  . glucose monitoring kit (FREESTYLE) monitoring kit 1 each by Does not apply route 4 (four) times daily - after meals and at bedtime. 1 month Diabetic Testing  Supplies for QAC-QHS accuchecks. 1 each 1  . meclizine (ANTIVERT) 25 MG tablet Take 1 tablet (25 mg total) by mouth 3 (three) times daily as needed for dizziness. 30 tablet 0  . metoCLOPramide (REGLAN) 5 MG tablet Take 1 tablet (5 mg total) by mouth every 8 (eight) hours as needed for nausea. 15 tablet 0  . rosuvastatin (CRESTOR) 10 MG tablet Take 1 tablet (10 mg total) by mouth daily. 92 tablet 3   No current facility-administered medications for this visit.     Objective: BP 122/74 (BP Location: Left Arm, Patient Position: Sitting, Cuff Size: Large)   Pulse 91   Temp 97.5 F (36.4 C) (Oral)   Ht '5\' 11"'  (1.803 m)   Wt 166 lb (75.3 kg)   SpO2 95%   BMI 23.15 kg/m  Gen: NAD, resting comfortably CV: RRR no murmurs rubs or gallops Lungs: CTAB no crackles, wheeze, rhonchi Abdomen: soft/nontender/nondistended/normal bowel sounds. No rebound or guarding.  Ext: no edema Skin: warm, dry Neuro: CN II-XII intact, sensation and reflexes normal throughout, 5/5 muscle strength in bilateral upper and lower extremities. Normal finger to nose. No pronator drift. Walks with cane  Assessment/Plan:   Vertigo following cerebrovascular accident S: patient hospitalized form 12/13/16 to 5/518 with stroke thought due to atrial fibrillation potentially. He also has recently diagnosed meothelioma. He presented with dizziness. MRI showed possible occiptal area stroke. It was thought his dizziness was related to this. Carotid dopplers without  significant stenosis and normal EF and no cardiac source of emboli on carotid doppler. He was changed to pradaxa from eliquis by stroke team but [facility] due to cost and loewr risk of bleeding  Luckily no weakness at time of discharge- was sent home with home health PT. We have recently sent in hospital bed as well.   Patient states his symptoms have improved each day but still has daily symptoms. Seems more like vertigo- states room starts to slowly move in  front of him often when he is seated for a period- longest episode has been 3 minutes but as short as a minute as well. States his equilibrium is off and if happens with standing- really depends on his cane or sitting down quickly. Happens about once an hour A/P: I assume his dizziness/vertigo spells are stroke related. They are not with predictable head movements (doubt BPPV) I think its odd that they are not more persistent than they are though. In regards to prevention- he was changed to pradaxa by stroke team but back to eliquis by primary care at [facility] (due to lower bleeding risk it sounds like)- does not appear cost is main concern.   Also considered increasing crestor to get LDL under 70 as currently in 80s. From notes in hospital- seems embolic stroke thought most likely so will continue to monitor for now and continue crestor 56m only. At next visit could consider discussion of increasing to 272mcrestor.   Type 2 diabetes mellitus with hyperlipidemia ([place]: new diagnosis based on a1c of 6.8. Has glucometer and testing with no readings above 180 at home- mostly way lower than that.  A/P: a1c 6.8 and reasonable. With mesothelioma in him and wife being ill  will hold off on diabetes education- likely focus on quality of life. We agreed to cbg 1x a week maximum with  Goal fasting <150, nonfasting <180. Try to keep a1c under 7.5- would use medicine or education only above that point given age and comorbidities  [facility] to [facility]. We discussed and with his mesothelioma and not wanting to do chemotherapy or surgery- will get palliatve consult- fortunately minimal symptoms from this at present.   PRN follow up planned at present. Had a long conversation today about focusing on his comfort at this point.   Orders Placed This Encounter  Procedures  . Amb Referral to Palliative Care    Referral Priority:   Routine    Referral Type:    Consultation    Number of Visits Requested:   1    Meds ordered this encounter  Medications  . apixaban (ELIQUIS) 5 MG TABS tablet    Sig: Take 5 mg by mouth 2 (two) times daily.    Return precautions advised.  StGarret ReddishMD

## 2016-12-22 NOTE — Assessment & Plan Note (Signed)
S: new diagnosis based on a1c of 6.8. Has glucometer and testing with no readings above 180 at home- mostly way lower than that.  A/P: a1c 6.8 and reasonable. With mesothelioma in him and wife being ill  will hold off on diabetes education- likely focus on quality of life. We agreed to cbg 1x a week maximum with  Goal fasting <150, nonfasting <180. Try to keep a1c under 7.5- would use medicine or education only above that point given age and comorbidities

## 2016-12-22 NOTE — Telephone Encounter (Signed)
Dr. Leonie Man,   [place] with patient and family today. [facility] PCP actually transitioned back to eliquis due to lower bleeding risk. Was not a cost issue per patient and son today. I was wondering if you could have your nursing staff reach out to the [place] team to explain throught process on the pradaxa to see if they would be willing to transition back. I encouraged son and patient to schedule follow up with you in 4 weeks (about 6 weeks out from hospitalization)  Garret Reddish, patients PCP outside [place]

## 2016-12-22 NOTE — Assessment & Plan Note (Signed)
Wife recent referral to palliative care. We discussed and with his mesothelioma and not wanting to do chemotherapy or surgery- will get palliatve consult- fortunately minimal symptoms from this at present.

## 2016-12-23 ENCOUNTER — Encounter: Payer: Self-pay | Admitting: Internal Medicine

## 2016-12-23 DIAGNOSIS — Z7189 Other specified counseling: Secondary | ICD-10-CM

## 2016-12-23 DIAGNOSIS — Z5111 Encounter for antineoplastic chemotherapy: Secondary | ICD-10-CM | POA: Insufficient documentation

## 2016-12-23 HISTORY — DX: Other specified counseling: Z71.89

## 2016-12-25 ENCOUNTER — Telehealth: Payer: Self-pay | Admitting: Family Medicine

## 2016-12-25 NOTE — Telephone Encounter (Signed)
Dr Yong Channel I had suggested switching to Pradaxa as it has a slightly different mechanism of action compared to eliquis, Xarelto or sayvasa. Coumadin would certainly not be a good choice in this day and age due to issues with compliance, INR monitoring and restriction of lifestyle. We may possibly running into formulary issues at the [place] and if eliquis is more easily available and path of least resistance it is fine with me. To my knowledge there are no head to head comparison studies showing superiority of Pradaxa over eliquis

## 2016-12-25 NOTE — Telephone Encounter (Signed)
Called and left a voicemail message providing verbal order

## 2016-12-25 NOTE — Telephone Encounter (Signed)
Dr. Leonie Man- agree I think this is a formulary issue. We will stick with the eliquis for now since you are ok with it.

## 2016-12-25 NOTE — Telephone Encounter (Signed)
Thomas [name] Physical therapist from [facility]  needs verbal orders for  Home pt 1 to 2 times a week for 3 to 4 weeks.

## 2016-12-27 ENCOUNTER — Telehealth: Payer: Self-pay | Admitting: *Deleted

## 2016-12-27 NOTE — Telephone Encounter (Signed)
Returned call to Thomas [name] ( pt daughter), per MD informed Thomas [name] if pt chooses no treatment his life expectancy is about 6 months. Thomas [name] expressed understanding, she advised Dr. Yong Channel will be calling in Palliative care. She spoke of her mother who is also on palliative care and the difficulty she and family were going through. Discussed with Thomas [name] she can call with other concerns if needed in future. No further concerns at this time.

## 2017-01-01 ENCOUNTER — Telehealth: Payer: Self-pay | Admitting: Family Medicine

## 2017-01-01 NOTE — Telephone Encounter (Signed)
° °  Pt daughter call to say pt is very agitated and argumentative and is asking if there is anything that can be rx would like a call back   [facility]

## 2017-01-02 NOTE — Telephone Encounter (Signed)
Please see message below, please advise 

## 2017-01-03 NOTE — Telephone Encounter (Signed)
Spoke with daughter Ivin Booty who verbalized understanding. They have several questions so I scheduled them for an appointment this Friday. Pallative Care is involved

## 2017-01-03 NOTE — Telephone Encounter (Signed)
This appears to be Dr. [name] Bong patient

## 2017-01-03 NOTE — Telephone Encounter (Signed)
He is pretty high risk with his medical conditions. Also high stress with all recent information he has had to take in.   There are some options but I think sitting down to discuss these would be best. Another option- has palliative care seen him yet- might they have any recommendations after counseling?

## 2017-01-03 NOTE — Telephone Encounter (Signed)
° ° °  Routed to [employer]

## 2017-01-05 ENCOUNTER — Emergency Department (HOSPITAL_COMMUNITY): Payer: Medicare Other

## 2017-01-05 ENCOUNTER — Encounter: Payer: Self-pay | Admitting: Family Medicine

## 2017-01-05 ENCOUNTER — Encounter (HOSPITAL_COMMUNITY): Payer: Self-pay | Admitting: Emergency Medicine

## 2017-01-05 ENCOUNTER — Ambulatory Visit (INDEPENDENT_AMBULATORY_CARE_PROVIDER_SITE_OTHER): Payer: Medicare Other | Admitting: Family Medicine

## 2017-01-05 ENCOUNTER — Inpatient Hospital Stay (HOSPITAL_COMMUNITY)
Admission: EM | Admit: 2017-01-05 | Discharge: 2017-01-09 | DRG: 065 | Disposition: A | Discharge status: Rehabilitation Facility | Payer: Medicare Other | Attending: Neurology | Admitting: Neurology

## 2017-01-05 VITALS — BP 188/124 | HR 88 | Temp 97.9°F

## 2017-01-05 DIAGNOSIS — G9340 Encephalopathy, unspecified: Secondary | ICD-10-CM | POA: Diagnosis not present

## 2017-01-05 DIAGNOSIS — R29810 Facial weakness: Secondary | ICD-10-CM | POA: Diagnosis present

## 2017-01-05 DIAGNOSIS — E876 Hypokalemia: Secondary | ICD-10-CM | POA: Diagnosis present

## 2017-01-05 DIAGNOSIS — I480 Paroxysmal atrial fibrillation: Secondary | ICD-10-CM | POA: Diagnosis present

## 2017-01-05 DIAGNOSIS — Z9221 Personal history of antineoplastic chemotherapy: Secondary | ICD-10-CM | POA: Diagnosis not present

## 2017-01-05 DIAGNOSIS — N390 Urinary tract infection, site not specified: Secondary | ICD-10-CM | POA: Diagnosis not present

## 2017-01-05 DIAGNOSIS — I471 Supraventricular tachycardia: Secondary | ICD-10-CM | POA: Diagnosis present

## 2017-01-05 DIAGNOSIS — Z515 Encounter for palliative care: Secondary | ICD-10-CM | POA: Diagnosis not present

## 2017-01-05 DIAGNOSIS — E785 Hyperlipidemia, unspecified: Secondary | ICD-10-CM | POA: Diagnosis present

## 2017-01-05 DIAGNOSIS — I639 Cerebral infarction, unspecified: Secondary | ICD-10-CM

## 2017-01-05 DIAGNOSIS — R066 Hiccough: Secondary | ICD-10-CM | POA: Diagnosis present

## 2017-01-05 DIAGNOSIS — I10 Essential (primary) hypertension: Secondary | ICD-10-CM | POA: Diagnosis not present

## 2017-01-05 DIAGNOSIS — J90 Pleural effusion, not elsewhere classified: Secondary | ICD-10-CM | POA: Diagnosis not present

## 2017-01-05 DIAGNOSIS — E1159 Type 2 diabetes mellitus with other circulatory complications: Secondary | ICD-10-CM | POA: Diagnosis present

## 2017-01-05 DIAGNOSIS — C457 Mesothelioma of other sites: Secondary | ICD-10-CM | POA: Diagnosis present

## 2017-01-05 DIAGNOSIS — Z8546 Personal history of malignant neoplasm of prostate: Secondary | ICD-10-CM | POA: Diagnosis not present

## 2017-01-05 DIAGNOSIS — K222 Esophageal obstruction: Secondary | ICD-10-CM | POA: Diagnosis present

## 2017-01-05 DIAGNOSIS — I69391 Dysphagia following cerebral infarction: Secondary | ICD-10-CM | POA: Diagnosis not present

## 2017-01-05 DIAGNOSIS — I482 Chronic atrial fibrillation: Secondary | ICD-10-CM | POA: Diagnosis not present

## 2017-01-05 DIAGNOSIS — Z7901 Long term (current) use of anticoagulants: Secondary | ICD-10-CM

## 2017-01-05 DIAGNOSIS — R270 Ataxia, unspecified: Secondary | ICD-10-CM | POA: Diagnosis not present

## 2017-01-05 DIAGNOSIS — K219 Gastro-esophageal reflux disease without esophagitis: Secondary | ICD-10-CM | POA: Diagnosis present

## 2017-01-05 DIAGNOSIS — Z66 Do not resuscitate: Secondary | ICD-10-CM | POA: Diagnosis present

## 2017-01-05 DIAGNOSIS — I69393 Ataxia following cerebral infarction: Secondary | ICD-10-CM | POA: Diagnosis not present

## 2017-01-05 DIAGNOSIS — R269 Unspecified abnormalities of gait and mobility: Secondary | ICD-10-CM | POA: Diagnosis not present

## 2017-01-05 DIAGNOSIS — N3000 Acute cystitis without hematuria: Secondary | ICD-10-CM | POA: Diagnosis not present

## 2017-01-05 DIAGNOSIS — F1010 Alcohol abuse, uncomplicated: Secondary | ICD-10-CM | POA: Diagnosis present

## 2017-01-05 DIAGNOSIS — D62 Acute posthemorrhagic anemia: Secondary | ICD-10-CM | POA: Diagnosis not present

## 2017-01-05 DIAGNOSIS — Z8249 Family history of ischemic heart disease and other diseases of the circulatory system: Secondary | ICD-10-CM

## 2017-01-05 DIAGNOSIS — G8194 Hemiplegia, unspecified affecting left nondominant side: Secondary | ICD-10-CM | POA: Diagnosis present

## 2017-01-05 DIAGNOSIS — R531 Weakness: Secondary | ICD-10-CM | POA: Diagnosis present

## 2017-01-05 DIAGNOSIS — Z87891 Personal history of nicotine dependence: Secondary | ICD-10-CM

## 2017-01-05 DIAGNOSIS — I63439 Cerebral infarction due to embolism of unspecified posterior cerebral artery: Secondary | ICD-10-CM | POA: Diagnosis present

## 2017-01-05 DIAGNOSIS — I4892 Unspecified atrial flutter: Secondary | ICD-10-CM | POA: Diagnosis not present

## 2017-01-05 DIAGNOSIS — I340 Nonrheumatic mitral (valve) insufficiency: Secondary | ICD-10-CM | POA: Diagnosis not present

## 2017-01-05 DIAGNOSIS — R1310 Dysphagia, unspecified: Secondary | ICD-10-CM | POA: Diagnosis not present

## 2017-01-05 DIAGNOSIS — Z880 Allergy status to penicillin: Secondary | ICD-10-CM | POA: Diagnosis not present

## 2017-01-05 DIAGNOSIS — C450 Mesothelioma of pleura: Secondary | ICD-10-CM | POA: Diagnosis not present

## 2017-01-05 DIAGNOSIS — J910 Malignant pleural effusion: Secondary | ICD-10-CM | POA: Diagnosis not present

## 2017-01-05 DIAGNOSIS — I63431 Cerebral infarction due to embolism of right posterior cerebral artery: Principal | ICD-10-CM | POA: Diagnosis present

## 2017-01-05 DIAGNOSIS — I69398 Other sequelae of cerebral infarction: Secondary | ICD-10-CM | POA: Diagnosis not present

## 2017-01-05 LAB — RAPID URINE DRUG SCREEN, HOSP PERFORMED
AMPHETAMINES: NOT DETECTED
Barbiturates: NOT DETECTED
Benzodiazepines: NOT DETECTED
Cocaine: NOT DETECTED
OPIATES: NOT DETECTED
Tetrahydrocannabinol: NOT DETECTED

## 2017-01-05 LAB — I-STAT CHEM 8, ED
BUN: 18 mg/dL (ref 6–20)
CALCIUM ION: 1.07 mmol/L — AB (ref 1.15–1.40)
Chloride: 103 mmol/L (ref 101–111)
Creatinine, Ser: 1.3 mg/dL — ABNORMAL HIGH (ref 0.61–1.24)
Glucose, Bld: 129 mg/dL — ABNORMAL HIGH (ref 65–99)
HCT: 39 % (ref 39.0–52.0)
HEMOGLOBIN: 13.3 g/dL (ref 13.0–17.0)
Potassium: 3.2 mmol/L — ABNORMAL LOW (ref 3.5–5.1)
SODIUM: 139 mmol/L (ref 135–145)
TCO2: 22 mmol/L (ref 0–100)

## 2017-01-05 LAB — COMPREHENSIVE METABOLIC PANEL
ALK PHOS: 81 U/L (ref 38–126)
ALT: 14 U/L — ABNORMAL LOW (ref 17–63)
ANION GAP: 13 (ref 5–15)
AST: 17 U/L (ref 15–41)
Albumin: 3.3 g/dL — ABNORMAL LOW (ref 3.5–5.0)
BUN: 17 mg/dL (ref 6–20)
CALCIUM: 9.1 mg/dL (ref 8.9–10.3)
CO2: 20 mmol/L — AB (ref 22–32)
Chloride: 103 mmol/L (ref 101–111)
Creatinine, Ser: 1.28 mg/dL — ABNORMAL HIGH (ref 0.61–1.24)
GFR calc non Af Amer: 48 mL/min — ABNORMAL LOW (ref >60)
GFR, EST AFRICAN AMERICAN: 55 mL/min — AB (ref >60)
Glucose, Bld: 126 mg/dL — ABNORMAL HIGH (ref 65–99)
Potassium: 3.1 mmol/L — ABNORMAL LOW (ref 3.5–5.1)
SODIUM: 136 mmol/L (ref 135–145)
TOTAL PROTEIN: 7.6 g/dL (ref 6.5–8.1)
Total Bilirubin: 0.7 mg/dL (ref 0.3–1.2)

## 2017-01-05 LAB — DIFFERENTIAL
BASOS PCT: 0 %
Basophils Absolute: 0 10*3/uL (ref 0.0–0.1)
EOS PCT: 1 %
Eosinophils Absolute: 0 10*3/uL (ref 0.0–0.7)
LYMPHS PCT: 13 %
Lymphs Abs: 0.7 10*3/uL (ref 0.7–4.0)
MONO ABS: 1.1 10*3/uL — AB (ref 0.1–1.0)
Monocytes Relative: 20 %
Neutro Abs: 3.8 10*3/uL (ref 1.7–7.7)
Neutrophils Relative %: 66 %

## 2017-01-05 LAB — URINALYSIS, ROUTINE W REFLEX MICROSCOPIC
BACTERIA UA: NONE SEEN
BILIRUBIN URINE: NEGATIVE
Glucose, UA: 50 mg/dL — AB
KETONES UR: 20 mg/dL — AB
LEUKOCYTES UA: NEGATIVE
NITRITE: NEGATIVE
PROTEIN: 100 mg/dL — AB
Specific Gravity, Urine: >1.046 — ABNORMAL HIGH (ref 1.005–1.030)
pH: 5 (ref 5.0–8.0)

## 2017-01-05 LAB — CBC
HCT: 39 % (ref 39.0–52.0)
Hemoglobin: 12.8 g/dL — ABNORMAL LOW (ref 13.0–17.0)
MCH: 28.3 pg (ref 26.0–34.0)
MCHC: 32.8 g/dL (ref 30.0–36.0)
MCV: 86.3 fL (ref 78.0–100.0)
PLATELETS: 299 10*3/uL (ref 150–400)
RBC: 4.52 MIL/uL (ref 4.22–5.81)
RDW: 15.2 % (ref 11.5–15.5)
WBC: 5.7 10*3/uL (ref 4.0–10.5)

## 2017-01-05 LAB — PROTIME-INR
INR: 1.19
PROTHROMBIN TIME: 15.2 s (ref 11.4–15.2)

## 2017-01-05 LAB — I-STAT TROPONIN, ED: Troponin i, poc: 0 ng/mL (ref 0.00–0.08)

## 2017-01-05 LAB — ETHANOL

## 2017-01-05 LAB — APTT: aPTT: 36 seconds (ref 24–36)

## 2017-01-05 LAB — MRSA PCR SCREENING: MRSA by PCR: NEGATIVE

## 2017-01-05 MED ORDER — ASPIRIN 300 MG RE SUPP
300.0000 mg | Freq: Every day | RECTAL | Status: DC
  Administered 2017-01-05: 300 mg via RECTAL
  Filled 2017-01-05: qty 1

## 2017-01-05 MED ORDER — ACETAMINOPHEN 650 MG RE SUPP: 650.0000 mg | RECTAL | Status: DC | PRN

## 2017-01-05 MED ORDER — LABETALOL HCL 5 MG/ML IV SOLN
10.0000 mg | INTRAVENOUS | Status: DC | PRN
  Filled 2017-01-05: qty 4

## 2017-01-05 MED ORDER — IOPAMIDOL (ISOVUE-370) INJECTION 76%
INTRAVENOUS | Status: AC
  Administered 2017-01-05: 50 mL
  Filled 2017-01-05: qty 50

## 2017-01-05 MED ORDER — STROKE: EARLY STAGES OF RECOVERY BOOK
Freq: Once | Status: AC
  Administered 2017-01-05: 15:00:00
  Filled 2017-01-05: qty 1

## 2017-01-05 MED ORDER — ACETAMINOPHEN 160 MG/5ML PO SOLN: 650.0000 mg | ORAL | Status: DC | PRN

## 2017-01-05 MED ORDER — HEPARIN SODIUM (PORCINE) 5000 UNIT/ML IJ SOLN
5000.0000 [IU] | Freq: Three times a day (TID) | INTRAMUSCULAR | Status: DC
  Administered 2017-01-05 – 2017-01-09 (×13): 5000 [IU] via SUBCUTANEOUS
  Filled 2017-01-05 (×14): qty 1

## 2017-01-05 MED ORDER — POTASSIUM CHLORIDE 10 MEQ/100ML IV SOLN
10.0000 meq | INTRAVENOUS | Status: AC
  Administered 2017-01-05 (×3): 10 meq via INTRAVENOUS
  Filled 2017-01-05 (×3): qty 100

## 2017-01-05 MED ORDER — SODIUM CHLORIDE 0.9 % IV SOLN
INTRAVENOUS | Status: DC
  Administered 2017-01-05 – 2017-01-09 (×6): via INTRAVENOUS

## 2017-01-05 MED ORDER — IOPAMIDOL (ISOVUE-370) INJECTION 76%
INTRAVENOUS | Status: AC
  Filled 2017-01-05: qty 100

## 2017-01-05 MED ORDER — ACETAMINOPHEN 325 MG PO TABS
650.0000 mg | ORAL_TABLET | ORAL | Status: DC | PRN
  Administered 2017-01-09: 650 mg via ORAL
  Filled 2017-01-05: qty 2

## 2017-01-05 NOTE — ED Triage Notes (Addendum)
Pt in from home via [facility] with stroke like symptoms and generalized weakness x 2 days. Per EMS, pt was taken to his doc for the weakness. EMS reports that staff noticed L side droop L side weakness at 1100. Hx of TIA's, takes Eliquis (last taken today). Slurred speech on arrival, CBG 123, BP 170/110

## 2017-01-05 NOTE — H&P (Signed)
Neurology H&P  CC: Facial droop  History is obtained from: Family  HPI: Thomas [name] is a 83 y.o. male with recent occipital stroke who presents with 2 days of worsening general malaise/dizziness with acute worsening of facial weakness/dysarthria today around 11 AM. Of note, he is currently being referred to hospice with expectation of expectancy less than 6 months due to malignant mesothelioma.  The family has a meeting with palliative care set up for tomorrow, but has not yet had many discussions regarding limitations of care.  CT head was performed which demonstrates a large PICA infarct, CTA was performed which demonstrates distal vertebral occlusion with retrograde basilar filling. At this time, I suspect that he is at very high risk of worsening, but I think that endovascular intervention in this setting could carry significant risk as well and especially in someone with his current medical problems would be fairly aggressive.  I discussed with family various options indicating that I was not certain which would be superior, but that I favored observation with the understanding that there was potential for significant worsening. I discussed with him that if he does have significant worsening, he would likely be a debilitating stroke and in the setting would he want intubation, and both the patient and the family agreed that he would not want intubation in the setting.  LKW: 2 days ago tpa given?: no, anticoagulation/outside of window Modified Rankin Score: 2  ROS: A 14 point ROS was performed and is negative except as noted in the HPI.   Past Medical History:  Diagnosis Date  . Adenomatous colon polyp 2009  . Allergy   . Carotid artery occlusion   . Diverticulosis of colon (without mention of hemorrhage)   . ED (erectile dysfunction)   . Esophageal stricture   . Esophagitis   . GERD (gastroesophageal reflux disease)   . Goals of care, counseling/discussion 12/23/2016  . Hiatal  hernia   . Hypertension   . Prostate cancer ([place])   . Unspecified gastritis and gastroduodenitis without mention of hemorrhage      Family History  Problem Relation Age of Onset  . Heart disease Father        unclear specifics  . Cancer Mother   . Breast cancer Sister   . Hyperlipidemia Daughter   . Hypertension Daughter   . Hypertension Son      Social History:  reports that he quit smoking about 63 years ago. His smoking use included Cigarettes. He has a 2.00 pack-year smoking history. He has never used smokeless tobacco. He reports that he drinks alcohol. He reports that he does not use drugs.   Exam: Current vital signs: BP (!) 164/110   Pulse 79   Resp (!) 24   Ht 5\' 11"  (1.803 m)   Wt 75.3 kg (166 lb)   SpO2 99%   BMI 23.15 kg/m  Vital signs in last 24 hours: Temp:  [97.9 F (36.6 C)] 97.9 F (36.6 C) (05/25 1145) Pulse Rate:  [79-88] 79 (05/25 1233) Resp:  [24] 24 (05/25 1233) BP: (164-188)/(110-124) 164/110 (05/25 1233) SpO2:  [96 %-99 %] 99 % (05/25 1233) Weight:  [75.3 kg (166 lb)] 75.3 kg (166 lb) (05/25 1236)  Physical Exam  Constitutional: Appears well-developed and well-nourished.  Psych: Affect appropriate to situation Eyes: No scleral injection HENT: No OP obstrucion Head: Normocephalic.  Cardiovascular: Normal rate and regular rhythm.  Respiratory: Diminished breath sounds on left GI: Soft.  No distension. There is no tenderness.  Skin:  WDI  Neuro: Mental Status: Patient is awake, alert, oriented to person, place, situation Patient is able to give a clear and coherent history. No signs of aphasia or neglect Cranial Nerves: II: Visual Fields are full. Pupils are equal, round, and reactive to light.   III,IV, VI: He has slight disconjugate gaze at times, but he is able to move both eyes. V: Facial sensation is symmetric to temperature VII: Facial movement with significant right facial weakness VIII: hearing is intact to voice X: Uvula  elevates symmetrically XI: Shoulder shrug is symmetric. XII: tongue is midline without atrophy or fasciculations.  Motor: He has no drift in either upper extremity, but does have mild left leg weakness. He has good strength in his right leg. Sensory: Intact to LT and temp Cerebellar: He has marked ataxia on the right arm, right leg is slightly better, but with still some with ataxia  I have reviewed labs in epic and the results pertinent to this consultation are: CMP-mildly elevated creatinine, hypokalemia  I have reviewed the images obtained: CT head-interval occlusion of the vertebral artery  Impression: 83 year old male with large PICA infarct. I think that intervention in the posterior circulation would carry significant risk, and given his history of untreatable cancer, think that endovascular intervention would represent fairly aggressive therapy. It would not be expected to improve any of his current deficits as they are explained by his PICA infarct. I discussed this with the family and the current time we will plan to treat conservatively given his relatively good exam.  Recommendations: 1) MRI brain 2) he is currently anticoagulated with Eliquis, I will change to aspirin given the size of the cerebellar infarct. If he is waxing and waning, however, would start heparin. 3) palliative  care consultation 4) permissive hypertension to 220/120 5) aspirin 6) I have made him DO NOT RESUSCITATE/DO NOT INTUBATE 7) stroke team to follow   This patient is critically ill and at significant risk of neurological worsening, death and care requires constant monitoring of vital signs, hemodynamics,respiratory and cardiac monitoring, neurological assessment, discussion with family, other specialists and medical decision making of high complexity. I spent 50 minutes of neurocritical care time  in the care of  this patient.  Roland Rack, MD [facility] [phone]  If 7pm-  7am, please page neurology on call as listed in [place]. 01/05/2017  1:19 PM

## 2017-01-05 NOTE — Evaluation (Signed)
Speech Language Pathology Evaluation Patient Details Name: Thomas [name] MRN: [id number] DOB: [birth year] Today's Date: 01/05/2017 Time: 7829-5621 SLP Time Calculation (min) (ACUTE ONLY): 9 min  Problem List:  Patient Active Problem List   Diagnosis Date Noted  . Stroke (cerebrum) ([place]) 01/05/2017  . Goals of care, counseling/discussion 12/23/2016  . Encounter for antineoplastic chemotherapy 12/23/2016  . Vertigo following cerebrovascular accident 12/16/2016  . Hyperkalemia 12/16/2016  . Type 2 diabetes mellitus with hyperlipidemia ([place]) 12/16/2016  . Insomnia 12/07/2016  . Atelectasis 10/30/2016  . Alcohol abuse 08/04/2016  . Permanent atrial fibrillation ([place]) 07/21/2016  . Malignant mesothelioma of pleura ([place]) 02/16/2016  . Perforation of right tympanic membrane 11/24/2013  . Carotid stenosis 02/21/2013  . TIA (transient ischemic attack) 02/17/2013  . GERD (gastroesophageal reflux disease) 05/17/2011  . Esophageal dysphagia 05/17/2011  . Stricture and stenosis of esophagus 08/29/2010  . Allergic rhinitis 01/16/2008  . 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 PROSTATE CANCER, HX OF 03/26/2007  . Hyperlipidemia 03/20/2007  . Essential hypertension 03/20/2007   Past Medical History:  Past Medical History:  Diagnosis Date  . Adenomatous colon polyp 2009  . Allergy   . Carotid artery occlusion   . Diverticulosis of colon (without mention of hemorrhage)   . ED (erectile dysfunction)   . Esophageal stricture   . Esophagitis   . GERD (gastroesophageal reflux disease)   . Goals of care, counseling/discussion 12/23/2016  . Hiatal hernia   . Hypertension   . Prostate cancer ([place])   . Unspecified gastritis and gastroduodenitis without mention of hemorrhage    Past Surgical History:  Past Surgical History:  Procedure Laterality Date  . ENDARTERECTOMY Left 03/05/2013   Procedure: ENDARTERECTOMY CAROTID;  Surgeon: Conrad [place], MD;  Location: [facility];  Service: Vascular;   Laterality: Left;  . ESOPHAGOGASTRODUODENOSCOPY (EGD) WITH ESOPHAGEAL DILATION    . PROSTATE SURGERY     Laser surgery   HPI:  83 y.o. male with history of paroxysmal atrial fibrillation, recently diagnosed mesothelioma with recurrent pleural effusion, history of occipital stroke with hospitalization 12/13/16, GERD, esophageal stricture s/p multiple dilations, esophagitis, hiatal hernia. Presented 01/05/17 with 2 day history of increased weakness, facial drooping, slurred speech. CT showing acute interval extensive infarct in the right cerebellum, predominately inferiorly but also some involvement of the superior hemisphere. MRI pending. Seen by SLP during recent admission for cognitive linguistic evaluation only.    Assessment / Plan / Recommendation Clinical Impression  Pt presents with moderate-severe dysarthria, right-sided facial, lingual weakness and deterioration in cognitive-linguistic function. At baseline pt has short-term memory deficits, was noted with adequate attention during prior SLP evaluations. During this assessment, pt required verbal, occasional tactile cues for sustained attention to basic questions, information. Recommend he received skilled ST services to address dysarthria and cognitive-linguistic deficits in order to improve intelligibility, maximize cognition and safe transition to next level of care. SLP will follow acutely.    SLP Assessment  SLP Recommendation/Assessment: Patient needs continued Speech Lanaguage Pathology Services SLP Visit Diagnosis: Dysarthria and anarthria (R47.1);Cognitive communication deficit (R41.841)    Follow Up Recommendations  Other (comment) (TBD)    Frequency and Duration min 2x/week  2 weeks      SLP Evaluation Cognition  Overall Cognitive Status: Impaired/Different from baseline Arousal/Alertness: Awake/alert Orientation Level: Oriented to person;Oriented to time;Oriented to place;Oriented to situation Attention:  Sustained Sustained Attention: Impaired Sustained Attention Impairment: Verbal basic;Functional basic Memory: Impaired Memory Impairment: Storage deficit;Retrieval deficit Awareness: Appears intact Problem Solving: Impaired Problem Solving Impairment: Verbal  basic       Comprehension  Auditory Comprehension Overall Auditory Comprehension: Appears within functional limits for tasks assessed Yes/No Questions: Within Functional Limits Commands: Within Functional Limits Visual Recognition/Discrimination Discrimination: Not tested Reading Comprehension Reading Status: Not tested    Expression Expression Primary Mode of Expression: Verbal Verbal Expression Overall Verbal Expression: Appears within functional limits for tasks assessed Initiation: No impairment Automatic Speech: Name;Social Response Level of Generative/Spontaneous Verbalization: Sentence Written Expression Dominant Hand: Right Written Expression: Not tested   Oral / Motor  Oral Motor/Sensory Function Overall Oral Motor/Sensory Function: Severe impairment Facial ROM: Reduced right;Suspected CN VII (facial) dysfunction Facial Symmetry: Abnormal symmetry right;Suspected CN VII (facial) dysfunction Facial Strength: Reduced right;Suspected CN VII (facial) dysfunction Facial Sensation: Within Functional Limits Lingual ROM: Reduced right;Suspected CN XII (hypoglossal) dysfunction Lingual Symmetry: Abnormal symmetry right;Suspected CN XII (hypoglossal) dysfunction Lingual Strength: Reduced;Suspected CN XII (hypoglossal) dysfunction Lingual Sensation: Within Functional Limits Mandible: Within Functional Limits Motor Speech Overall Motor Speech: Impaired Respiration: Within functional limits Phonation: Low vocal intensity;Wet Articulation: Impaired Level of Impairment: Word Intelligibility: Intelligibility reduced Word: 50-74% accurate Phrase: 50-74% accurate Sentence: 50-74% accurate Conversation: 50-74%  accurate Effective Techniques: Over-articulate;Increased vocal intensity   GO                   Thomas [name], [place], CCC-SLP Speech-Language Pathologist [phone]  Thomas [name] 01/05/2017, 6:03 PM

## 2017-01-05 NOTE — Progress Notes (Signed)
Subjective:  Thomas [name] is a 83 y.o. year old very pleasant male patient who presents for/with See problem oriented charting ROS- weakness, slurred words, right sided facial droop. No chest pain or shortness of breath. Has been more agitated in recent days.    Past Medical History-  Patient Active Problem List   Diagnosis Date Noted  . Vertigo following cerebrovascular accident 12/16/2016    Priority: High  . Type 2 diabetes mellitus with hyperlipidemia ([place]) 12/16/2016    Priority: High  . Permanent atrial fibrillation ([place]) 07/21/2016    Priority: High  . Malignant mesothelioma of pleura ([place]) 02/16/2016    Priority: High  . Carotid stenosis 02/21/2013    Priority: High  . TIA (transient ischemic attack) 02/17/2013    Priority: High  . PROSTATE CANCER, HX OF 03/26/2007    Priority: High  . Alcohol abuse 08/04/2016    Priority: Medium  . GERD (gastroesophageal reflux disease) 05/17/2011    Priority: Medium  . Hyperlipidemia 03/20/2007    Priority: Medium  . Essential hypertension 03/20/2007    Priority: Medium  . Perforation of right tympanic membrane 11/24/2013    Priority: Low  . Esophageal dysphagia 05/17/2011    Priority: Low  . Stricture and stenosis of esophagus 08/29/2010    Priority: Low  . Allergic rhinitis 01/16/2008    Priority: Low  . ERECTILE DYSFUNCTION, ORGANIC 03/26/2007    Priority: Low  . Goals of care, counseling/discussion 12/23/2016  . Encounter for antineoplastic chemotherapy 12/23/2016  . Hyperkalemia 12/16/2016  . Insomnia 12/07/2016  . Atelectasis 10/30/2016    Medications- reviewed and updated Current Outpatient Prescriptions  Medication Sig Dispense Refill  . amLODipine (NORVASC) 5 MG tablet Take 1 tablet (5 mg total) by mouth daily. 30 tablet 3  . apixaban (ELIQUIS) 5 MG TABS tablet Take 5 mg by mouth 2 (two) times daily.    . cloNIDine (CATAPRES) 0.1 MG tablet TAKE ONE TABLET BY MOUTH THREE TIMES DAILY 90 tablet 4  . glucose  monitoring kit (FREESTYLE) monitoring kit 1 each by Does not apply route 4 (four) times daily - after meals and at bedtime. 1 month Diabetic Testing Supplies for QAC-QHS accuchecks. 1 each 1  . meclizine (ANTIVERT) 25 MG tablet Take 1 tablet (25 mg total) by mouth 3 (three) times daily as needed for dizziness. 30 tablet 0  . metoCLOPramide (REGLAN) 5 MG tablet Take 1 tablet (5 mg total) by mouth every 8 (eight) hours as needed for nausea. 15 tablet 0  . rosuvastatin (CRESTOR) 10 MG tablet Take 1 tablet (10 mg total) by mouth daily. 92 tablet 3   No current facility-administered medications for this visit.     Objective: BP (!) 188/124   Pulse 88   Temp 97.9 F (36.6 C)   SpO2 96%  Gen: slumped over in chair, slurred words CV: RRR no murmurs rubs or gallops Lungs: CTAB no crackles, wheeze, rhonchi  Ext: no edema Skin: warm, dry Neuro: right sided facial droop- does not appear to spare forehead. Slurred words.   Assessment/Plan:  Cerebrovascular accident (CVA), unspecified mechanism ([place]) S: Last normal 9 30- then noted some slurred words and then worsening. Also noted right sided facial droop that appeared to worsen as he came over to be seen today.   Was to see Dr. Jannifer Franklin on June 11th  Family originally scheduled visit to follow up on agitation- they were concerned abut UTI but had none of the above symptoms at that time.   History  of hypertension, a fib on eliquis. Has been compliant with all meds.  A/P:83 year old with history occipital stroke in early may.  Concern for acute CVA with slurred words and right facial weakness- does not appear to spare forehead- possibility of bells palsy but with slurred words as well will activate code stroke. EMS called- in route to [place].   Hypertension likely reactive to stroke.   Return precautions advised.  Garret Reddish, MD

## 2017-01-05 NOTE — Code Documentation (Signed)
83yo male arriving to [facility] at 1206 at [address].  Patient's daughter reports patient has experienced generalized weakness x 2 days and had a doctor's appointment scheduled this morning.  Patient went to the appointment and was noted to have right facial droop and slurred speech at 1100.  EMS was called and activated a code stroke.  Patient with h/o atrial fibrillation on Eliquis, last dose today.  Patient with h/o recent occipital infarct.  Stroke team at the bedside on patient arrival.  Labs drawn and patient to CT with team.  CT completed showing acute to subacute infarct in the inferior right cerebellum.  NIHSS 7, see documentation for details and code stroke times.  Patient missed both questions and had RUE ataxia, right facial droop, left leg drift and moderate dysarthria on exam.  CTA head and neck completed showing interval occlusion of the right V3 and V4 segments with intermittent tenuous blood flow in the basilar and bilateral cerebral arteries.  Patient is contraindicated for tPA d/t taking Eliquis.  No endovascular intervention at this time per Dr. Leonel Ramsay.  Bedside handoff with ED RN Vicente Males.

## 2017-01-05 NOTE — ED Provider Notes (Signed)
[facility] DEPT Provider Note   CSN: [id number] Arrival date & time: 01/05/17  1206   An emergency department physician performed an initial assessment on this suspected stroke patient at 1231.  History   Chief Complaint Chief Complaint  Patient presents with  . Code Stroke    HPI Fernando Stoiber is a 83 y.o. male.  Patient presents as a code stroke. He's an 83 year old male with a history of atrial fibrillation on eloquent is, hypertension and recent admission for a acute stroke. He's had a 2 day history of increased weakness. His wife states that he's been unable to stand. She feels like he's weaker more on one side versus the other. He was trying to take him to his doctor this morning and the patient was unable to walk into the office. In the office, they noted that he had some facial drooping and slurred speech. EMS was called and he was brought here as a code stroke.      Past Medical History:  Diagnosis Date  . Adenomatous colon polyp 2009  . Allergy   . Carotid artery occlusion   . Diverticulosis of colon (without mention of hemorrhage)   . ED (erectile dysfunction)   . Esophageal stricture   . Esophagitis   . GERD (gastroesophageal reflux disease)   . Goals of care, counseling/discussion 12/23/2016  . Hiatal hernia   . Hypertension   . Prostate cancer ([place])   . Unspecified gastritis and gastroduodenitis without mention of hemorrhage     Patient Active Problem List   Diagnosis Date Noted  . Goals of care, counseling/discussion 12/23/2016  . Encounter for antineoplastic chemotherapy 12/23/2016  . Vertigo following cerebrovascular accident 12/16/2016  . Hyperkalemia 12/16/2016  . Type 2 diabetes mellitus with hyperlipidemia ([place]) 12/16/2016  . Insomnia 12/07/2016  . Atelectasis 10/30/2016  . Alcohol abuse 08/04/2016  . Permanent atrial fibrillation ([place]) 07/21/2016  . Malignant mesothelioma of pleura ([place]) 02/16/2016  . Perforation of right tympanic  membrane 11/24/2013  . Carotid stenosis 02/21/2013  . TIA (transient ischemic attack) 02/17/2013  . GERD (gastroesophageal reflux disease) 05/17/2011  . Esophageal dysphagia 05/17/2011  . Stricture and stenosis of esophagus 08/29/2010  . Allergic rhinitis 01/16/2008  . 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 PROSTATE CANCER, HX OF 03/26/2007  . Hyperlipidemia 03/20/2007  . Essential hypertension 03/20/2007    Past Surgical History:  Procedure Laterality Date  . ENDARTERECTOMY Left 03/05/2013   Procedure: ENDARTERECTOMY CAROTID;  Surgeon: Conrad Clawson, MD;  Location: [facility];  Service: Vascular;  Laterality: Left;  . ESOPHAGOGASTRODUODENOSCOPY (EGD) WITH ESOPHAGEAL DILATION    . PROSTATE SURGERY     Laser surgery       Home Medications    Prior to Admission medications   Medication Sig Start Date End Date Taking? Authorizing Provider  amLODipine (NORVASC) 5 MG tablet Take 1 tablet (5 mg total) by mouth daily. 10/16/16   Almyra Deforest, PA  apixaban (ELIQUIS) 5 MG TABS tablet Take 5 mg by mouth 2 (two) times daily.    [provider]  cloNIDine (CATAPRES) 0.1 MG tablet TAKE ONE TABLET BY MOUTH THREE TIMES DAILY 09/18/16   Dorena Cookey, MD  glucose monitoring kit (FREESTYLE) monitoring kit 1 each by Does not apply route 4 (four) times daily - after meals and at bedtime. 1 month Diabetic Testing Supplies for QAC-QHS accuchecks. 12/16/16   Dhungel, Flonnie Overman, MD  meclizine (ANTIVERT) 25 MG tablet Take 1 tablet (25 mg total) by mouth 3 (three) times daily  as needed for dizziness. 12/16/16   Dhungel, Flonnie Overman, MD  metoCLOPramide (REGLAN) 5 MG tablet Take 1 tablet (5 mg total) by mouth every 8 (eight) hours as needed for nausea. 12/16/16   Dhungel, Nishant, MD  rosuvastatin (CRESTOR) 10 MG tablet Take 1 tablet (10 mg total) by mouth daily. 05/29/16   Marin Olp, MD    Family History Family History  Problem Relation Age of Onset  . Heart disease Father        unclear specifics  .  Cancer Mother   . Breast cancer Sister   . Hyperlipidemia Daughter   . Hypertension Daughter   . Hypertension Son     Social History Social History  Substance Use Topics  . Smoking status: Former Smoker    Packs/day: 0.50    Years: 4.00    Types: Cigarettes    Quit date: 12/21/1953  . Smokeless tobacco: Never Used  . Alcohol use 0.0 oz/week     Comment: daily--one alcohol drink a day     Allergies   Ace inhibitors and Penicillins   Review of Systems Review of Systems  Unable to perform ROS: Mental status change     Physical Exam Updated Vital Signs BP (!) 164/110   Pulse 79   Resp (!) 24   Ht '5\' 11"'  (1.803 m)   Wt 75.3 kg (166 lb)   SpO2 99%   BMI 23.15 kg/m   Physical Exam  Constitutional: He appears well-developed and well-nourished.  HENT:  Head: Normocephalic and atraumatic.  Eyes: Pupils are equal, round, and reactive to light.  Neck: Normal range of motion. Neck supple.  Cardiovascular: Normal rate, regular rhythm and normal heart sounds.   Pulmonary/Chest: Effort normal and breath sounds normal. No respiratory distress. He has no wheezes. He has no rales. He exhibits no tenderness.  Abdominal: Soft. Bowel sounds are normal. There is no tenderness. There is no rebound and no guarding.  Musculoskeletal: Normal range of motion. He exhibits no edema.  Lymphadenopathy:    He has no cervical adenopathy.  Neurological: He is alert.  Oriented to person and place, weakness for the left arm and leg as compared to the right, right side facial drooping, slurred speech  Skin: Skin is warm and dry. No rash noted.  Psychiatric: He has a normal mood and affect.     ED Treatments / Results  Labs (all labs ordered are listed, but only abnormal results are displayed) Labs Reviewed  CBC - Abnormal; Notable for the following:       Result Value   Hemoglobin 12.8 (*)    All other components within normal limits  DIFFERENTIAL - Abnormal; Notable for the following:     Monocytes Absolute 1.1 (*)    All other components within normal limits  I-STAT CHEM 8, ED - Abnormal; Notable for the following:    Potassium 3.2 (*)    Creatinine, Ser 1.30 (*)    Glucose, Bld 129 (*)    Calcium, Ion 1.07 (*)    All other components within normal limits  ETHANOL  PROTIME-INR  APTT  COMPREHENSIVE METABOLIC PANEL  RAPID URINE DRUG SCREEN, HOSP PERFORMED  URINALYSIS, ROUTINE W REFLEX MICROSCOPIC  I-STAT TROPOININ, ED  I-STAT CHEM 8, ED    EKG  EKG Interpretation  Date/Time:  Friday Jan 05 2017 12:34:02 EDT Ventricular Rate:  77 PR Interval:    QRS Duration: 112 QT Interval:  410 QTC Calculation: 464 R Axis:   -18 Text Interpretation:  Atrial fibrillation Borderline intraventricular conduction delay Borderline T wave abnormalities Baseline wander in lead(s) II III aVF since last tracing no significant change Confirmed by Malvin Johns [phone]) on 01/05/2017 12:36:41 PM       Radiology Ct Head Code Stroke Wo Contrast  Result Date: 01/05/2017 CLINICAL DATA:  Code stroke. Left weakness and slurred speech. Concern for PICA infarct per report. EXAM: CT HEAD WITHOUT CONTRAST TECHNIQUE: Contiguous axial images were obtained from the base of the skull through the vertex without intravenous contrast. COMPARISON:  Brain MRI 12/13/2016 FINDINGS: Brain: Interval extensive infarct in the right cerebellum, predominately inferiorly but also some involvement of the superior hemisphere. No notable mass effect and no fourth ventricular narrowing. Remote infarcts in the left more than right cerebellum. Chronic small vessel ischemia with extensive gliosis in the bilateral cerebral white matter. Cerebral volume loss, mild for age. No hemorrhage, hydrocephalus, or masslike findings. Vascular: No hyperdense vessel. Extensive atherosclerotic calcification. Skull: Negative Sinuses/Orbits: Negative ASPECTS ([place] Stroke Program Early CT Score) Not scored with these findings. These  results were called by telephone at the time of interpretation on 01/05/2017 at 12:26 pm to Dr. Leonel Ramsay, who verbally acknowledged these results. IMPRESSION: 1. Acute to subacute infarct in the inferior right cerebellum. 2. Small to moderate remote infarcts in the bilateral occipital lobes. 3. Diffuse chronic small vessel ischemia in the cerebral white matter. Electronically Signed   By: Monte Fantasia M.D.   On: 01/05/2017 12:26    Procedures Procedures (including critical care time)  Medications Ordered in ED Medications  iopamidol (ISOVUE-370) 76 % injection (50 mLs  Contrast Given 01/05/17 1220)     Initial Impression / Assessment and Plan / ED Course  I have reviewed the triage vital signs and the nursing notes.  Pertinent labs & imaging results that were available during my care of the patient were reviewed by me and considered in my medical decision making (see chart for details).     Patient presents as a code stroke. He is not a candidate for TPA. He is on Eliquis. He'll be admitted for further treatment to the ICU by Dr. Leonel Ramsay.  Final Clinical Impressions(s) / ED Diagnoses   Final diagnoses:  Stroke (cerebrum) ([place])  Stroke (cerebrum) [facility])    New Prescriptions New Prescriptions   No medications on file     Malvin Johns, MD 01/05/17 1427

## 2017-01-05 NOTE — Evaluation (Signed)
Clinical/Bedside Swallow Evaluation Patient Details  Name: Thomas [name] MRN: [id number] Date of Birth: [birth year]  Today's Date: 01/05/2017 Time: SLP Start Time (ACUTE ONLY): 9622 SLP Stop Time (ACUTE ONLY): 1540 SLP Time Calculation (min) (ACUTE ONLY): 10 min  Past Medical History:  Past Medical History:  Diagnosis Date  . Adenomatous colon polyp 2009  . Allergy   . Carotid artery occlusion   . Diverticulosis of colon (without mention of hemorrhage)   . ED (erectile dysfunction)   . Esophageal stricture   . Esophagitis   . GERD (gastroesophageal reflux disease)   . Goals of care, counseling/discussion 12/23/2016  . Hiatal hernia   . Hypertension   . Prostate cancer ([place])   . Unspecified gastritis and gastroduodenitis without mention of hemorrhage    Past Surgical History:  Past Surgical History:  Procedure Laterality Date  . ENDARTERECTOMY Left 03/05/2013   Procedure: ENDARTERECTOMY CAROTID;  Surgeon: Conrad [place], MD;  Location: [facility];  Service: Vascular;  Laterality: Left;  . ESOPHAGOGASTRODUODENOSCOPY (EGD) WITH ESOPHAGEAL DILATION    . PROSTATE SURGERY     Laser surgery   HPI:  83 y.o. male with history of paroxysmal atrial fibrillation, recently diagnosed mesothelioma with recurrent pleural effusion, history of occipital stroke with hospitalization 12/13/16, GERD, esophageal stricture s/p multiple dilations, esophagitis, hiatal hernia. Presented 01/05/17 with 2 day history of increased weakness, facial drooping, slurred speech. CT showing acute interval extensive infarct in the right cerebellum, predominately inferiorly but also some involvement of the superior hemisphere. MRI pending. Seen by SLP during recent admission for cognitive linguistic evaluation only.    Assessment / Plan / Recommendation Clinical Impression  Patient presents with severe risk for aspiration given history of CVA, GERD, and esophageal issues, right sided facial and lingual weakness, and reduced  management of secretions. Pt is alert and cooperative, with mod-severe dysarthria. When presented with ice chip, teaspoon of thin water, pt with immediate cough, suggestive of reduced airway protection. Provided education to pt and family re: swallow physiology, risks for aspiration and instrumental assessments. Given pt's high risk for aspiration, recommend NPO pending instrumental assessment (MBS) to be performed next date. Pt/family in agreement with this plan. Additional recommendations to follow instrumental exam. SLP Visit Diagnosis: Dysphagia, unspecified (R13.10)    Aspiration Risk  Severe aspiration risk    Diet Recommendation NPO   Medication Administration: Via alternative means    Other  Recommendations Oral Care Recommendations: Oral care QID Other Recommendations: Remove water pitcher;Have oral suction available   Follow up Recommendations        Frequency and Duration            Prognosis Prognosis for Safe Diet Advancement: Good      Swallow Study   General Date of Onset: 01/05/17 HPI: 83 y.o. male with history of paroxysmal atrial fibrillation, recently diagnosed mesothelioma with recurrent pleural effusion, history of occipital stroke with hospitalization 12/13/16, GERD, esophageal stricture s/p multiple dilations, esophagitis, hiatal hernia. Presented 01/05/17 with 2 day history of increased weakness, facial drooping, slurred speech. CT showing acute interval extensive infarct in the right cerebellum, predominately inferiorly but also some involvement of the superior hemisphere. MRI pending. Seen by SLP during recent admission for cognitive linguistic evaluation only.  Type of Study: Bedside Swallow Evaluation Previous Swallow Assessment: none in chart, see HPI for esophageal hx Diet Prior to this Study: NPO Temperature Spikes Noted: No Respiratory Status: Room air History of Recent Intubation: No Behavior/Cognition: Alert;Cooperative Oral Cavity Assessment:  Excessive secretions Oral  Care Completed by SLP: Yes Oral Cavity - Dentition: Missing dentition;Poor condition Vision: Functional for self-feeding Self-Feeding Abilities: Needs assist Patient Positioning: Upright in bed Baseline Vocal Quality: Wet;Low vocal intensity Volitional Cough: Weak;Wet Volitional Swallow: Able to elicit    Oral/Motor/Sensory Function Overall Oral Motor/Sensory Function: Severe impairment Facial ROM: Reduced right;Suspected CN VII (facial) dysfunction Facial Symmetry: Abnormal symmetry right;Suspected CN VII (facial) dysfunction Facial Strength: Reduced right;Suspected CN VII (facial) dysfunction Facial Sensation: Within Functional Limits Lingual ROM: Reduced right;Suspected CN XII (hypoglossal) dysfunction Lingual Symmetry: Abnormal symmetry right;Suspected CN XII (hypoglossal) dysfunction Lingual Strength: Reduced;Suspected CN XII (hypoglossal) dysfunction Lingual Sensation: Within Functional Limits Mandible: Within Functional Limits   Ice Chips Ice chips: Impaired Presentation: Spoon Oral Phase Impairments: Reduced labial seal Oral Phase Functional Implications: Right anterior spillage Pharyngeal Phase Impairments: Cough - Delayed;Wet Vocal Quality;Suspected delayed Swallow   Thin Liquid Thin Liquid: Impaired Presentation: Spoon Oral Phase Impairments: Reduced labial seal Oral Phase Functional Implications: Right anterior spillage Pharyngeal  Phase Impairments: Cough - Delayed;Wet Vocal Quality;Suspected delayed Swallow    Nectar Thick Nectar Thick Liquid: Not tested   Honey Thick Honey Thick Liquid: Not tested   Puree Puree: Not tested   Solid   GO   Solid: Not tested       [name], MS, CCC-SLP Speech-Language Pathologist [phone] [name] 01/05/2017,5:52 PM

## 2017-01-06 ENCOUNTER — Inpatient Hospital Stay (HOSPITAL_COMMUNITY): Payer: Medicare Other

## 2017-01-06 DIAGNOSIS — R1310 Dysphagia, unspecified: Secondary | ICD-10-CM

## 2017-01-06 DIAGNOSIS — I340 Nonrheumatic mitral (valve) insufficiency: Secondary | ICD-10-CM

## 2017-01-06 DIAGNOSIS — C457 Mesothelioma of other sites: Secondary | ICD-10-CM

## 2017-01-06 DIAGNOSIS — Z515 Encounter for palliative care: Secondary | ICD-10-CM

## 2017-01-06 LAB — LIPID PANEL
Cholesterol: 131 mg/dL (ref 0–200)
HDL: 28 mg/dL — AB (ref >40)
LDL Cholesterol: 85 mg/dL (ref 0–99)
TRIGLYCERIDES: 91 mg/dL (ref <150)
Total CHOL/HDL Ratio: 4.7 RATIO
VLDL: 18 mg/dL (ref 0–40)

## 2017-01-06 MED ORDER — ONDANSETRON HCL 4 MG/2ML IJ SOLN
4.0000 mg | Freq: Four times a day (QID) | INTRAMUSCULAR | Status: DC | PRN
  Administered 2017-01-06 – 2017-01-09 (×5): 4 mg via INTRAVENOUS
  Filled 2017-01-06 (×4): qty 2

## 2017-01-06 MED ORDER — ONDANSETRON HCL 4 MG/2ML IJ SOLN
INTRAMUSCULAR | Status: AC
  Administered 2017-01-06: 4 mg via INTRAVENOUS
  Filled 2017-01-06: qty 2

## 2017-01-06 MED ORDER — DIPHENHYDRAMINE HCL 25 MG PO CAPS: 25.0000 mg | ORAL_CAPSULE | Freq: Once | ORAL | Status: DC

## 2017-01-06 MED ORDER — ASPIRIN EC 325 MG PO TBEC
325.0000 mg | DELAYED_RELEASE_TABLET | Freq: Every day | ORAL | Status: DC
  Administered 2017-01-06 – 2017-01-09 (×4): 325 mg via ORAL
  Filled 2017-01-06 (×4): qty 1

## 2017-01-06 MED ORDER — POTASSIUM CHLORIDE 20 MEQ PO PACK
20.0000 meq | PACK | Freq: Two times a day (BID) | ORAL | Status: AC
  Administered 2017-01-06 – 2017-01-08 (×6): 20 meq via ORAL
  Filled 2017-01-06 (×6): qty 1

## 2017-01-06 MED ORDER — ROSUVASTATIN CALCIUM 5 MG PO TABS
10.0000 mg | ORAL_TABLET | Freq: Every day | ORAL | Status: DC
  Administered 2017-01-07 – 2017-01-09 (×3): 10 mg via ORAL
  Filled 2017-01-06 (×3): qty 2

## 2017-01-06 MED ORDER — DIPHENHYDRAMINE HCL 50 MG/ML IJ SOLN
25.0000 mg | Freq: Once | INTRAMUSCULAR | Status: AC
  Administered 2017-01-06: 25 mg via INTRAVENOUS
  Filled 2017-01-06: qty 1

## 2017-01-06 MED ORDER — STARCH (THICKENING) PO POWD
ORAL | Status: DC | PRN
  Filled 2017-01-06: qty 227

## 2017-01-06 NOTE — Consult Note (Signed)
Consultation Note Date: 01/06/2017   Patient Name: Thomas [name]  DOB: 01-14-1926  MRN: [id number]  Age / Sex: 83 y.o., male  PCP: Marin Olp, MD Referring Physician: Greta Doom, *  Reason for Consultation: Establishing goals of care, Hospice Evaluation and Psychosocial/spiritual support  HPI/Patient Profile: 83 y.o. male  with past medical history of Carotid artery occlusion, diverticulosis, GERD, hypertension, prostate cancer, new diagnosis of mesothelioma with a left pleural effusion, atrial fibrillation, recent CVA admitted on 01/05/2017 with worsening malaise, dysarthria. 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   Clinical Assessment and Goals of Care: Patient is alert and can answer questions requiring simple answers such as yes and no. He is oriented to himself, that he is in the hospital, and that he's had a stroke. He also is aware of his diagnosis of mesothelioma. At the bedside are his 2 daughters his son, and his wife. They'll speak openly with him present about his clinical condition including that they have been considering hospice prior to coming into the hospital on 01/05/2017. The primary goal is comfort. They recognize with the confluence of serious illnesses such as mesothelioma severe coronary artery disease with carotid and basilar occlusions, aspiration risk, stroke, that he could have less than 6 months to live. They are however wanting to pursue modified barium swallow, OT and PT, MRI while in the hospital. The goal is to maintain his independence as long as possible.  Patient at this point can make some decisions, and he is married who would be legally his healthcare proxy. It appears as though the family is making decisions as a whole with patient and his wife seeking guidance from their 3 children. Mrs. Mittleman is extremely frail     SUMMARY OF RECOMMENDATIONS   Concur with DNR/DNI Family is receptive to hospice but this is not finalized yet They wish to receive PT, OT, modified barium swallow, MRI results before making a final decision In addition to comfort, quality of life, maintaining independence for his long as possible is also patient and family goal Palliative medicine to follow during the course of hospitalization to see if home with hospice is their goal or if they're going to wish to pursue more aggressive rehabilitation such as at a skilled nursing facility   Code Status/Advance Care Planning:  DNR    Symptom Management:   Pain: Patient denies pain. Continue with Tylenol as needed. Monitor for need for alternative agents  Dyspnea: No current shortness of breath verbalized however he is coughing with sputum that is clear. This is not a new symptom for patient and daughter's shares that this is secondary to his mesothelioma. Continue with targeted pulmonary treatments, suction at bedside. Monitor for need for opiates  Palliative Prophylaxis:   Aspiration, Bowel Regimen, Delirium Protocol, Eye Care, Frequent Pain Assessment, Oral Care and Turn Reposition  Additional Recommendations (Limitations, Scope, Preferences):  No Chemotherapy, No Radiation, No Surgical Procedures and No Tracheostomy  Psycho-social/Spiritual:   Desire for further  Chaplaincy support:no  Additional Recommendations: Grief/Bereavement Support  Prognosis:   < 6 months in the setting of CVA, aspiration, atrial fibrillation, malignant mesothelioma, severe coronary artery disease involving carotid arteries as well as right occluded basilar artery. Patient is high risk for an acute event such as CVA or cardiopulmonary failure  Discharge Planning: To Be Determined      Primary Diagnoses: Present on Admission: . Stroke (cerebrum) ([place])   I have reviewed the medical record, interviewed the patient and family, and examined the  patient. The following aspects are pertinent.  Past Medical History:  Diagnosis Date  . Adenomatous colon polyp 2009  . Allergy   . Carotid artery occlusion   . Diverticulosis of colon (without mention of hemorrhage)   . ED (erectile dysfunction)   . Esophageal stricture   . Esophagitis   . GERD (gastroesophageal reflux disease)   . Goals of care, counseling/discussion 12/23/2016  . Hiatal hernia   . Hypertension   . Prostate cancer ([place])   . Unspecified gastritis and gastroduodenitis without mention of hemorrhage    Social History   Social History  . Marital status: Married    Spouse name: N/A  . Number of children: N/A  . Years of education: N/A   Social History Main Topics  . Smoking status: Former Smoker    Packs/day: 0.50    Years: 4.00    Types: Cigarettes    Quit date: 12/21/1953  . Smokeless tobacco: Never Used  . Alcohol use 0.0 oz/week     Comment: daily--one alcohol drink a day  . Drug use: No  . Sexual activity: Not Asked   Other Topics Concern  . None   Social History Narrative   Married 1948. 6 children. 9 grandkids. 2 greatgrandkids.  (One son was killed in the [place])      [employer]- honorable discharge.       Hobbies: golfing about twice a month.    Family History  Problem Relation Age of Onset  . Heart disease Father        unclear specifics  . Cancer Mother   . Breast cancer Sister   . Hyperlipidemia Daughter   . Hypertension Daughter   . Hypertension Son    Scheduled Meds: . aspirin  300 mg Rectal Daily  . heparin  5,000 Units Subcutaneous Q8H   Continuous Infusions: . sodium chloride 75 mL/hr at 01/06/17 0800   PRN Meds:.acetaminophen **OR** acetaminophen (TYLENOL) oral liquid 160 mg/5 mL **OR** acetaminophen, labetalol Medications Prior to Admission:  Prior to Admission medications   Medication Sig Start Date End Date Taking? Authorizing Provider  amLODipine (NORVASC) 5 MG tablet Take 1  tablet (5 mg total) by mouth daily. 10/16/16  Yes Almyra Deforest, PA  apixaban (ELIQUIS) 5 MG TABS tablet Take 5 mg by mouth 2 (two) times daily.   Yes [provider]  cloNIDine (CATAPRES) 0.1 MG tablet TAKE ONE TABLET BY MOUTH THREE TIMES DAILY 09/18/16  Yes Dorena Cookey, MD  metoCLOPramide (REGLAN) 5 MG tablet Take 1 tablet (5 mg total) by mouth every 8 (eight) hours as needed for nausea. 12/16/16  Yes Dhungel, Nishant, MD  rosuvastatin (CRESTOR) 10 MG tablet Take 1 tablet (10 mg total) by mouth daily. 05/29/16  Yes Marin Olp, MD  meclizine (ANTIVERT) 25 MG tablet Take 1 tablet (25 mg total) by mouth 3 (three) times daily as needed for dizziness. Patient not taking: Reported on 01/05/2017 12/16/16   Dhungel,  Flonnie Overman, MD   Allergies  Allergen Reactions  . Ace Inhibitors Hives  . Penicillins Swelling    Swelling  of the face. Has patient had a PCN reaction causing immediate rash, facial/tongue/throat swelling, SOB or lightheadedness with hypotension: YES Has patient had a PCN reaction causing severe rash involving mucus membranes or skin necrosis: NO Has patient had a PCN reaction that required hospitalizationNO Has patient had a PCN reaction occurring within the last 10 years: NO If all of the above answers are "NO", then may proceed with Cephalosporin use.   Review of Systems  Unable to perform ROS: Other    Physical Exam  Constitutional: He appears well-developed.  Elderly man who looks younger than his stated age, in no acute distress  HENT:  Head: Normocephalic and atraumatic.  Neck: Normal range of motion.  Cardiovascular: Normal rate.   Pulmonary/Chest: Effort normal.  Genitourinary:  Genitourinary Comments: Foley  Musculoskeletal:  Can move all 4 extremities. Lower extremities appear weak. Decreased movement to left upper extremity  Neurological: He is alert.  Can answer simple questions, yes or no  Skin: Skin is warm and dry.  Psychiatric: He has a normal mood and  affect. His behavior is normal.  Nursing note and vitals reviewed.   Vital Signs: BP (!) 174/118   Pulse 70   Temp 98.9 F (37.2 C) (Oral)   Resp (!) 23   Ht 5\' 11"  (1.803 m)   Wt 72.6 kg (160 lb 0.9 oz)   SpO2 97%   BMI 22.32 kg/m  Pain Assessment: No/denies pain       SpO2: SpO2: 97 % O2 Device:SpO2: 97 % O2 Flow Rate: .   IO: Intake/output summary:  Intake/Output Summary (Last 24 hours) at 01/06/17 0944 Last data filed at 01/06/17 0800  Gross per 24 hour  Intake          1456.25 ml  Output              100 ml  Net          1356.25 ml    LBM: Last BM Date:  (pta) Baseline Weight: Weight: 75.3 kg (166 lb) Most recent weight: Weight: 72.6 kg (160 lb 0.9 oz)     Palliative Assessment/Data:   Flowsheet Rows     Most Recent Value  Intake Tab  Referral Department  Hospitalist  Unit at Time of Referral  ICU  Palliative Care Primary Diagnosis  Neurology  Date Notified  01/05/17  Palliative Care Type  New Palliative care  Reason for referral  Clarify Goals of Care, Psychosocial or Spiritual support, Counsel Regarding Hospice  Date of Admission  01/05/17  Date first seen by Palliative Care  01/06/17  # of days Palliative referral response time  1 Day(s)  # of days IP prior to Palliative referral  0  Clinical Assessment  Palliative Performance Scale Score  30%  Pain Max last 24 hours  Not able to report  Pain Min Last 24 hours  Not able to report  Dyspnea Max Last 24 Hours  Not able to report  Dyspnea Min Last 24 hours  Not able to report  Nausea Max Last 24 Hours  Not able to report  Nausea Min Last 24 Hours  Not able to report  Anxiety Max Last 24 Hours  Not able to report  Anxiety Min Last 24 Hours  Not able to report  Other Max Last 24 Hours  Not able to report  Psychosocial & Spiritual  Assessment  Palliative Care Outcomes  Patient/Family meeting held?  Yes  Who was at the meeting?  wife, 2 dtr's, son  Palliative Care Outcomes  Clarified goals of care,  Counseled regarding hospice, Provided psychosocial or spiritual support  Patient/Family wishes: Interventions discontinued/not started   Mechanical Ventilation, Trach  Palliative Care follow-up planned  Yes, Facility      Time In: 0800 Time Out: 0920 Time Total: 80 min Greater than 50%  of this time was spent counseling and coordinating care related to the above assessment and plan.  Signed by: Dory Horn, NP   Please contact Palliative Medicine Team phone at [phone] for questions and concerns.  For individual provider: See Shea Evans

## 2017-01-06 NOTE — Progress Notes (Signed)
Modified Barium Swallow Progress Note  Patient Details  Name: Fielding Mault MRN: [id number] Date of Birth: November 22, 1925  Today's Date: 01/06/2017  Modified Barium Swallow completed.  Full report located under Chart Review in the Imaging Section.  Brief recommendations include the following:  Clinical Impression  Patient presents with mild oropharyngeal dysphagia c/b oral weakness and delayed swallow initiation leading to gross sensed aspiration of of thin liquids during the swallow. Clinically pt's management of secretions appears greatly improved today; dysarthria appears mild and voice is clear vs. wet. Suspect residual oral deficits impacting pt's ability to compensate for normative, age-related changes in swallowing. Pt's swallow initiation delayed to pyriform sinuses with thin liquids and larger volumes of nectar, honey-thick liquids. This led to significant aspiration during the swallow with thin liquids x1. A chin tuck was trialed which reduced but did not prevent penetration/aspiration, though this strategy may be appropriate to use with smaller volumes of liquid (tsp). Epiglottic inversion, pharyngeal stripping complete, with no abnormal residue remaining after the swallow. Esophageal sweep was performed, revealing barium tablet and thin column of barium remaining in the distal esophagus. Recommend initiating dys 3 diet with nectar thick liquids, medications whole in puree. SLP will follow for tolerance, training in compensatory strategies, advancement as appropriate. Given pt's esophageal history, long-term use of thickened liquids is not recommended; anticipate advancement of liquids with improvements at bedside.   Swallow Evaluation Recommendations       SLP Diet Recommendations: Dysphagia 3 (Mech soft) solids;Nectar thick liquid   Liquid Administration via: Cup;Straw   Medication Administration: Whole meds with puree   Supervision: Patient able to self feed;Intermittent supervision  to cue for compensatory strategies   Compensations: Slow rate;Small sips/bites;Monitor for anterior loss   Postural Changes: Seated upright at 90 degrees;Remain semi-upright after after feeds/meals (Comment)   Oral Care Recommendations: Oral care BID   Other Recommendations: Order thickener from pharmacy;Prohibited food (jello, ice cream, thin soups);Remove water pitcher  [name], [place], [employer] Speech-Language Pathologist [phone]   [name] 01/06/2017,2:41 PM

## 2017-01-06 NOTE — Progress Notes (Signed)
  Echocardiogram 2D Echocardiogram has been performed.  Donata Clay 01/06/2017, 5:36 PM

## 2017-01-06 NOTE — Progress Notes (Signed)
STROKE TEAM PROGRESS NOTE   HISTORY OF PRESENT ILLNESS (per record) Thomas [name] is a 84 y.o. male with recent occipital stroke who presents with 2 days of worsening general malaise/dizziness with acute worsening of facial weakness/dysarthria today around 11 AM. Of note, he is currently being referred to hospice with expectation of expectancy less than 6 months due to malignant mesothelioma.  The family has a meeting with palliative care set up for tomorrow, but has not yet had many discussions regarding limitations of care.  CT head was performed which demonstrates a large PICA infarct, CTA was performed which demonstrates distal vertebral occlusion with retrograde basilar filling. At this time, I suspect that he is at very high risk of worsening, but I think that endovascular intervention in this setting could carry significant risk as well and especially in someone with his current medical problems would be fairly aggressive.  I discussed with family various options indicating that I was not certain which would be superior, but that I favored observation with the understanding that there was potential for significant worsening. I discussed with him that if he does have significant worsening, he would likely be a debilitating stroke and in the setting would he want intubation, and both the patient and the family agreed that he would not want intubation in the setting.  LKW: 2 days ago tpa given?: no, anticoagulation/outside of window Modified Rankin Score: 2   SUBJECTIVE (INTERVAL HISTORY) His daughters and wife are at bedside. His speech has improved as has his coordination. Swallow study and MRI brain pending   OBJECTIVE Temp:  [97.7 F (36.5 C)-98.8 F (37.1 C)] 98.7 F (37.1 C) (05/26 0400) Pulse Rate:  [31-92] 31 (05/26 0800) Cardiac Rhythm: Atrial fibrillation (05/26 0800) Resp:  [15-29] 25 (05/26 0800) BP: (154-231)/(78-124) 189/85 (05/26 0800) SpO2:  [95 %-100 %] 96 %  (05/26 0800) Weight:  [72.6 kg (160 lb 0.9 oz)-75.3 kg (166 lb)] 72.6 kg (160 lb 0.9 oz) (05/25 1500)  CBC:  Recent Labs Lab 01/05/17 1214 01/05/17 1226  WBC  --  5.7  NEUTROABS  --  3.8  HGB 13.3 12.8*  HCT 39.0 39.0  MCV  --  86.3  PLT  --  540    Basic Metabolic Panel:  Recent Labs Lab 01/05/17 1214 01/05/17 1226  NA 139 136  K 3.2* 3.1*  CL 103 103  CO2  --  20*  GLUCOSE 129* 126*  BUN 18 17  CREATININE 1.30* 1.28*  CALCIUM  --  9.1    Lipid Panel:    Component Value Date/Time   CHOL 131 01/06/2017 0213   TRIG 91 01/06/2017 0213   HDL 28 (L) 01/06/2017 0213   CHOLHDL 4.7 01/06/2017 0213   VLDL 18 01/06/2017 0213   LDLCALC 85 01/06/2017 0213   HgbA1c:  Lab Results  Component Value Date   HGBA1C 6.8 (H) 12/14/2016   Urine Drug Screen:    Component Value Date/Time   LABOPIA NONE DETECTED 01/05/2017 1920   COCAINSCRNUR NONE DETECTED 01/05/2017 1920   LABBENZ NONE DETECTED 01/05/2017 1920   AMPHETMU NONE DETECTED 01/05/2017 1920   THCU NONE DETECTED 01/05/2017 1920   LABBARB NONE DETECTED 01/05/2017 1920    Alcohol Level     Component Value Date/Time   ETH <5 01/05/2017 1226    IMAGING  Ct Angio Head W Or Wo Contrast Ct Angio Neck W Or Wo Contrast 01/05/2017 1. Interval occlusion of the right V3 and V4 segments with intermittent tenuous blood  flow in the basilar and bilateral posterior cerebral arteries. There is underlying severe right V4 stenoses by prior MRA. (left prevertebral artery ends in PICA and there is no significant posterior communicating arteries).  2. Advanced intracranial atherosclerosis with high-grade right cavernous and distal M1 segment stenoses.  3. Severe atherosclerosis of the bilateral cervical carotids with left mid ICA intimal flap causing ~50% narrowing.  4. Acute to subacute right cerebellar infarct.  5. Complex left pleural effusion with history of mesothelioma.      Ct Head Code Stroke Wo Contrast 01/05/2017 1.  Acute to subacute infarct in the inferior right cerebellum.  2. Small to moderate remote infarcts in the bilateral occipital lobes.  3. Diffuse chronic small vessel ischemia in the cerebral white matter.     PHYSICAL EXAM  Physical exam: Exam: Gen: NAD                   CV: RRR, no MRG. No Carotid Bruits. No peripheral edema, warm, nontender Eyes: Conjunctivae clear without exudates or hemorrhage  Neuro: Detailed Neurologic Exam  Speech:    Speech is dysarthric .  Cognition:    The patient is oriented to person, place, month, not year (says 68);     recent and remote memory impaired;    Cranial Nerves:    The pupils are equal, round, and reactive to light. Visual fields are full to threat. Right esotropia but xtraocular movements are intact. Trigeminal sensation is intact and the muscles of mastication are normal. Right facial droop. The palate elevates in the midline. Hearing intact. Voice is normal. Shoulder shrug is normal. The tongue has normal motion without fasciculations.   Coordination: right-sided dysmetria  Motor Observation:    no involuntary movements noted. Tone:    Normal muscle tone.    Strength:    Strength is intact and symmetric in the upper and lower limbs.      Sensation: intact to LT      ASSESSMENT/PLAN [name] is a 84 y.o. male with history of prostate cancer, hypertension, paroxysmal atrial fibrillation on Eliquis prior to admission, carotid artery occlusion, and malignant mesothelioma with DO NOT RESUSCITATE order presenting with facial weakness, dysarthria, malaise, and dizziness. He did not receive IV t-PA due to anticoagulation.  Stroke:  PICA infarct - embolic secondary to atrial fibrillation.  Resultant  Dysmetria, dysarthria, hemiplegia  CT head - Acute to subacute infarct in the inferior right cerebellum.   MRI head pending  MRA head not performed  CTA H&N - Interval occlusion of the right V3 and V4 segments with  intermittent tenuous blood flow in the basilar and bilateral posterior cerebral arteries  Carotid Doppler - CTA neck  2D Echo - pending  LDL - 85  HgbA1c - pending  VTE prophylaxis - subcutaneous heparin and SCDs  Diet NPO time specified  Eliquis (apixaban) daily prior to admission, now on aspirin 325 mg daily  Patient counseled to be compliant with his antithrombotic medications  Ongoing aggressive stroke risk factor management  Therapy recommendations: pending  Disposition: Pending  Hypertension  Stable - but on the high side.  Permissive hypertension (OK if < 220/120) but gradually normalize in 5-7 days  Long-term BP goal normotensive  Hyperlipidemia  Home meds:  Crestor 10 mg daily resumed in hospital  LDL 85, goal < 70  Continue statin at discharge    Other Stroke Risk Factors  Advanced age  Former cigarette smoker quit 63 years ago.  ETOH use, advised  to drink no more than 1 drink per day.  Hx stroke/TIA - remote infarcts on head CT.  Atrial fibrillation  Other Active Problems  Palliative care following  Hypokalemia - 3.1 - supplement recheck in a.m..  Mildly elevated creatinine 1.28  Hospital day # 1  Personally examined patient and images, and have participated in and made any corrections needed to history, physical, neuro exam,assessment and plan as stated above.  I have personally obtained the history, evaluated lab date, reviewed imaging studies and agree with radiology interpretations.    Sarina Ill, MD Stroke Neurology  To contact Stroke Continuity provider, please refer to [email or web address] After hours, contact General Neurology

## 2017-01-07 LAB — BASIC METABOLIC PANEL
ANION GAP: 8 (ref 5–15)
BUN: 18 mg/dL (ref 6–20)
CHLORIDE: 107 mmol/L (ref 101–111)
CO2: 22 mmol/L (ref 22–32)
Calcium: 8.7 mg/dL — ABNORMAL LOW (ref 8.9–10.3)
Creatinine, Ser: 1.1 mg/dL (ref 0.61–1.24)
GFR, EST NON AFRICAN AMERICAN: 57 mL/min — AB (ref >60)
Glucose, Bld: 130 mg/dL — ABNORMAL HIGH (ref 65–99)
POTASSIUM: 3.8 mmol/L (ref 3.5–5.1)
Sodium: 137 mmol/L (ref 135–145)

## 2017-01-07 LAB — ECHOCARDIOGRAM LIMITED
HEIGHTINCHES: 71 in
Weight: 2560.86 oz

## 2017-01-07 LAB — HEMOGLOBIN A1C
Hgb A1c MFr Bld: 6.9 % — ABNORMAL HIGH (ref 4.8–5.6)
MEAN PLASMA GLUCOSE: 151 mg/dL

## 2017-01-07 MED ORDER — HYDRALAZINE HCL 20 MG/ML IJ SOLN
2.0000 mg | Freq: Once | INTRAMUSCULAR | Status: AC
  Administered 2017-01-07: 2 mg via INTRAVENOUS
  Filled 2017-01-07: qty 1

## 2017-01-07 MED ORDER — AMLODIPINE BESYLATE 5 MG PO TABS
5.0000 mg | ORAL_TABLET | Freq: Every day | ORAL | Status: DC
  Administered 2017-01-07 – 2017-01-09 (×3): 5 mg via ORAL
  Filled 2017-01-07 (×3): qty 1

## 2017-01-07 MED ORDER — METOCLOPRAMIDE HCL 5 MG/ML IJ SOLN
10.0000 mg | Freq: Once | INTRAMUSCULAR | Status: AC
  Administered 2017-01-07: 10 mg via INTRAVENOUS
  Filled 2017-01-07: qty 2

## 2017-01-07 NOTE — Progress Notes (Signed)
Daily Progress Note   Patient Name: Thomas [name]       Date: 01/07/2017 DOB: 1925-09-09  Age: 83 y.o. MRN#: [id number] Attending Physician: Garvin Fila, MD Primary Care Physician: Marin Olp, MD Admit Date: 01/05/2017  Reason for Consultation/Follow-up: Establishing goals of care and Psychosocial/spiritual support  Subjective: Patient is alert and oriented 3. He has undergone a modified barium swallow  and is recommended that he be on a dysphagia 3 nectar thick diet. He also has had a follow-up MRI which does show a new acute right cerebellar infarct. Echo repeated and EF 50-55%. Wife, patient's sons as well as daughter at the bedside  Length of Stay: 2  Current Medications: Scheduled Meds:  . amLODipine  5 mg Oral Daily  . aspirin EC  325 mg Oral Daily  . heparin  5,000 Units Subcutaneous Q8H  . potassium chloride  20 mEq Oral BID  . rosuvastatin  10 mg Oral Daily    Continuous Infusions: . sodium chloride 75 mL/hr at 01/07/17 0746    PRN Meds: acetaminophen **OR** acetaminophen (TYLENOL) oral liquid 160 mg/5 mL **OR** acetaminophen, food thickener, labetalol, ondansetron (ZOFRAN) IV  Physical Exam  Constitutional: He is oriented to person, place, and time. He appears well-developed and well-nourished.  Cardiovascular:  Irregular  Pulmonary/Chest: Effort normal.  Abdominal: Soft.  Neurological: He is alert and oriented to person, place, and time.  Skin: Skin is warm and dry.  Psychiatric: He has a normal mood and affect. His behavior is normal.  Nursing note and vitals reviewed.           Vital Signs: BP (!) 164/94 (BP Location: Left Arm)   Pulse 76   Temp 98.4 F (36.9 C) (Oral)   Resp 17   Ht 5\' 11"  (1.803 m)   Wt 72.6 kg (160 lb 0.9 oz)   SpO2 97%    BMI 22.32 kg/m  SpO2: SpO2: 97 % O2 Device: O2 Device: Not Delivered O2 Flow Rate:    Intake/output summary:  Intake/Output Summary (Last 24 hours) at 01/07/17 1629 Last data filed at 01/07/17 0400  Gross per 24 hour  Intake            482.5 ml  Output                0  ml  Net            482.5 ml   LBM: Last BM Date:  (pta) Baseline Weight: Weight: 75.3 kg (166 lb) Most recent weight: Weight: 72.6 kg (160 lb 0.9 oz)       Palliative Assessment/Data:    Flowsheet Rows     Most Recent Value  Intake Tab  Referral Department  Hospitalist  Unit at Time of Referral  ICU  Palliative Care Primary Diagnosis  Neurology  Date Notified  01/05/17  Palliative Care Type  New Palliative care  Reason for referral  Clarify Goals of Care, Psychosocial or Spiritual support, Counsel Regarding Hospice  Date of Admission  01/05/17  Date first seen by Palliative Care  01/06/17  # of days Palliative referral response time  1 Day(s)  # of days IP prior to Palliative referral  0  Clinical Assessment  Palliative Performance Scale Score  30%  Pain Max last 24 hours  Not able to report  Pain Min Last 24 hours  Not able to report  Dyspnea Max Last 24 Hours  Not able to report  Dyspnea Min Last 24 hours  Not able to report  Nausea Max Last 24 Hours  Not able to report  Nausea Min Last 24 Hours  Not able to report  Anxiety Max Last 24 Hours  Not able to report  Anxiety Min Last 24 Hours  Not able to report  Other Max Last 24 Hours  Not able to report  Psychosocial & Spiritual Assessment  Palliative Care Outcomes  Patient/Family meeting held?  Yes  Who was at the meeting?  wife, 2 dtr's, son  Palliative Care Outcomes  Clarified goals of care, Counseled regarding hospice, Provided psychosocial or spiritual support  Patient/Family wishes: Interventions discontinued/not started   Mechanical Ventilation, Trach  Palliative Care follow-up planned  Yes, Facility      Patient Active Problem List    Diagnosis Date Noted  . Mesothelioma of left lung ([place])   . Palliative care by specialist   . Cerebellar infarct ([place])   . Stroke (cerebrum) ([place]) 01/05/2017  . Goals of care, counseling/discussion 12/23/2016  . Encounter for antineoplastic chemotherapy 12/23/2016  . Vertigo following cerebrovascular accident 12/16/2016  . Hyperkalemia 12/16/2016  . Type 2 diabetes mellitus with hyperlipidemia ([place]) 12/16/2016  . Insomnia 12/07/2016  . Atelectasis 10/30/2016  . Alcohol abuse 08/04/2016  . Permanent atrial fibrillation ([place]) 07/21/2016  . Malignant mesothelioma of pleura ([place]) 02/16/2016  . Perforation of right tympanic membrane 11/24/2013  . Carotid stenosis 02/21/2013  . TIA (transient ischemic attack) 02/17/2013  . GERD (gastroesophageal reflux disease) 05/17/2011  . Dysphagia 05/17/2011  . Stricture and stenosis of esophagus 08/29/2010  . Allergic rhinitis 01/16/2008  . 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 PROSTATE CANCER, HX OF 03/26/2007  . Hyperlipidemia 03/20/2007  . Essential hypertension 03/20/2007    Palliative Care Assessment & Plan   Patient Profile: 83 y.o. male  with past medical history of Carotid artery occlusion, diverticulosis, GERD, hypertension, prostate cancer, new diagnosis of mesothelioma with a left pleural effusion, atrial fibrillation, recent CVA admitted on 01/05/2017 with worsening malaise, dysarthria. 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  Repeat MRI reflects new, acute right cerebellar infarct and pontine infarct with petechial hemorrhage; stable old left frontal and left parietal infarcts; moderate to severe small vessel disease   Recommendations/Plan:  Family at this point wishes to pursue skilled nursing placement for rehabilitation  before returning home  Instructed them how to access hospice wants they have utilized their skilled days  Order placed to social work for skilled nursing facility for  rehabilitation with palliative care to follow-up in facility  Goals of Care and Additional Recommendations:  Limitations on Scope of Treatment: Avoid Hospitalization, No Artificial Feeding, No Chemotherapy, No Hemodialysis, No Radiation, No Surgical Procedures and No Tracheostomy  Code Status:    Code Status Orders        Start     Ordered   01/05/17 1325  Do not attempt resuscitation (DNR)  Continuous    Question Answer Comment  In the event of cardiac or respiratory ARREST Do not call a "code blue"   In the event of cardiac or respiratory ARREST Do not perform Intubation, CPR, defibrillation or ACLS   In the event of cardiac or respiratory ARREST Use medication by any route, position, wound care, and other measures to relive pain and suffering. May use oxygen, suction and manual treatment of airway obstruction as needed for comfort.      01/05/17 1324    Code Status History    Date Active Date Inactive Code Status Order ID Comments User Context   12/13/2016 11:24 PM 12/16/2016  5:16 PM Full Code [id number]  Rise Patience, MD Inpatient   03/05/2013  4:24 PM 03/06/2013  2:11 PM Full Code [id number]  Richrd Prime, PA-C Inpatient    Advance Directive Documentation     Most Recent Value  Type of Advance Directive  Healthcare Power of Attorney  Pre-existing out of facility DNR order (yellow form or pink MOST form)  -  "MOST" Form in Place?  -       Prognosis:   < 6 months  Discharge Planning:  [facility] for rehab with Palliative care service follow-up    Thank you for allowing the Palliative Medicine Team to assist in the care of this patient.   Time In: 1200 Time Out: 1230 Total Time 30 min Prolonged Time Billed  no       Greater than 50%  of this time was spent counseling and coordinating care related to the above assessment and plan.  Dory Horn, NP  Please contact Palliative Medicine Team phone at [phone] for questions and concerns.

## 2017-01-07 NOTE — Evaluation (Signed)
Physical Therapy Evaluation Patient Details Name: Thomas [name] MRN: [id number] DOB: 1926/03/17 Today's Date: 01/07/2017   History of Present Illness  Pt is an 83 y.o. male with recent B occipital stroke who presented to the ED with 2 days of worsening malaise/dizziness with acute worsening of facial weakness/dysarthria. MRI revealing R cerebellar and pontine infarcts (including R PICA) with petechial hemorrhage and stable old B occipital, L frontal, and L parietal infarcts. PMH significant for recent diagnosis of malignant mesothelioma, adenomatous colon polyp, esophageal stricture, esophagitis, GERD, prostate cancer, hypertension, hiatal hernia, and unspecified gastritis and gastroduodenitis without mention of hemorrhage.     Clinical Impression  Pt presents with the above diagnosis and below deficits for therapy evaluation. Prior to admission, pt was living with his wife and son in a single level home. Pt was recently admitted for a stroke earlier this month and has now returned with new symptoms. Pt requires supervision to min guard for bed mobility and initial transfer but requires Mod-Max A x2 with gait or any dynamic mobility due to ataxia and poor postural control with right lateral lean and forward lean. Pt will benefit from continued acute PT services in order to address the below deficits before discharge to venue recommended below.     Follow Up Recommendations SNF;Supervision/Assistance - 24 hour    Equipment Recommendations  None recommended by PT    Recommendations for Other Services       Precautions / Restrictions Precautions Precautions: Fall Restrictions Weight Bearing Restrictions: No      Mobility  Bed Mobility Overal bed mobility: Needs Assistance Bed Mobility: Supine to Sit     Supine to sit: Supervision     General bed mobility comments: Increased time with supervision for safety.  Transfers Overall transfer level: Needs assistance Equipment used:  Rolling walker (2 wheeled) Transfers: Sit to/from Stand Sit to Stand: Min assist;+2 safety/equipment         General transfer comment: Min assist for steadying during rise to stand. Decreased balance once standing.   Ambulation/Gait Ambulation/Gait assistance: Mod assist;+2 physical assistance Ambulation Distance (Feet): 15 Feet Assistive device: Rolling walker (2 wheeled) Gait Pattern/deviations: Ataxic;Decreased step length - right;Decreased step length - left;Step-through pattern;Drifts right/left;Trunk flexed Gait velocity: decreased Gait velocity interpretation: Below normal speed for age/gender General Gait Details: definitive ataxia with gait with right lateral lean and need for Mod A to maintain upright posture with gait. Max cues for positioning within RW. Pt leaning forward when standing at commode and requires Mod A to stabilize in bathroom. Attempted to back out of bathroom to recliner, pt becomes very unsteady and requries Max A to negotiate posteriorly and requires recliner to be brought to him to sit.    Stairs            Wheelchair Mobility    Modified Rankin (Stroke Patients Only)       Balance Overall balance assessment: Needs assistance Sitting-balance support: No upper extremity supported;Feet supported Sitting balance-Leahy Scale: Fair   Postural control: Right lateral lean;Other (comment) (forward lean) Standing balance support: Bilateral upper extremity supported;Single extremity supported;During functional activity Standing balance-Leahy Scale: Poor Standing balance comment: Reliant on UE support and external assistance. Mod assist +2 for dynamic standing.                              Pertinent Vitals/Pain Pain Assessment: No/denies pain    Home Living Family/patient expects to be discharged to:: Private residence  Living Arrangements: Spouse/significant other;Children Available Help at Discharge: Family;Available  PRN/intermittently Type of Home: House Home Access: Stairs to enter Entrance Stairs-Rails: Right Entrance Stairs-Number of Steps: 2 Home Layout: One level Home Equipment: Walker - 2 wheels;Cane - single point;Bedside commode;Shower seat      Prior Function Level of Independence: Independent with assistive device(s)         Comments: was getting around with RW without A until recent CVA     Hand Dominance   Dominant Hand: Right    Extremity/Trunk Assessment   Upper Extremity Assessment Upper Extremity Assessment: Defer to OT evaluation    Lower Extremity Assessment Lower Extremity Assessment: Generalized weakness       Communication   Communication: HOH  Cognition Arousal/Alertness: Awake/alert Behavior During Therapy: WFL for tasks assessed/performed Overall Cognitive Status: Impaired/Different from baseline Area of Impairment: Orientation;Attention;Memory;Following commands;Safety/judgement;Awareness;Problem solving                 Orientation Level: Disoriented to;Time;Situation Current Attention Level: Selective Memory: Decreased short-term memory Following Commands: Follows one step commands consistently;Follows one step commands with increased time Safety/Judgement: Decreased awareness of safety;Decreased awareness of deficits Awareness: Emergent Problem Solving: Slow processing;Difficulty sequencing;Requires verbal cues General Comments: Pt with decreased sequencing abilities during functional mobility making maneuvering RW difficult.      General Comments      Exercises     Assessment/Plan    PT Assessment Patient needs continued PT services  PT Problem List Decreased balance;Decreased mobility;Decreased coordination;Decreased knowledge of use of DME;Decreased cognition;Decreased safety awareness       PT Treatment Interventions DME instruction;Gait training;Functional mobility training;Therapeutic activities;Therapeutic exercise;Balance  training;Neuromuscular re-education    PT Goals (Current goals can be found in the Care Plan section)  Acute Rehab PT Goals Patient Stated Goal: to go home PT Goal Formulation: With patient/family Time For Goal Achievement: 01/21/17 Potential to Achieve Goals: Fair    Frequency Min 3X/week   Barriers to discharge        Co-evaluation               AM-PAC PT "6 Clicks" Daily Activity  Outcome Measure Difficulty turning over in bed (including adjusting bedclothes, sheets and blankets)?: None Difficulty moving from lying on back to sitting on the side of the bed? : None Difficulty sitting down on and standing up from a chair with arms (e.g., wheelchair, bedside commode, etc,.)?: A Little Help needed moving to and from a bed to chair (including a wheelchair)?: Total Help needed walking in hospital room?: Total Help needed climbing 3-5 steps with a railing? : Total 6 Click Score: 14    End of Session Equipment Utilized During Treatment: Gait belt Activity Tolerance: Patient limited by fatigue Patient left: in chair;with call bell/phone within reach;with family/visitor present Nurse Communication: Mobility status PT Visit Diagnosis: Ataxic gait (R26.0);Other symptoms and signs involving the nervous system (R29.898)    Time: 1101-1130 PT Time Calculation (min) (ACUTE ONLY): 29 min   Charges:   PT Evaluation $PT Eval Moderate Complexity: 1 Procedure     PT G Codes:        Scheryl Marten PT, DPT  [phone]   Shanon Rosser 01/07/2017, 4:40 PM

## 2017-01-07 NOTE — Evaluation (Signed)
Occupational Therapy Evaluation Patient Details Name: Thomas [name] MRN: [id number] DOB: 10/03/1925 Today's Date: 01/07/2017    History of Present Illness Pt is an 83 y.o. male with recent B occipital stroke who presented to the ED with 2 days of worsening malaise/dizziness with acute worsening of facial weakness/dysarthria. MRI revealing R cerebellar and pontine infarcts (including R PICA) with petechial hemorrhage and stable old B occipital, L frontal, and L parietal infarcts. PMH significant for recent diagnosis of malignant mesothelioma, adenomatous colon polyp, esophageal stricture, esophagitis, GERD, prostate cancer, hypertension, hiatal hernia, and unspecified gastritis and gastroduodenitis without mention of hemorrhage.    Clinical Impression   PTA, pt and family report that he was independent with ADL and functional mobility with RW. Pt currently requires mod +2 assist for toilet transfers, min assist for LB ADL, and supervision for seated UB ADL. Pt presents with decreased cognition, decreased dynamic balance, ataxia with functional mobility, and decreased safety awareness impacting ability to participate in ADL at PLOF. He would benefit from continued OT services while admitted to improve independence with ADL and functional mobility. Recommend short-term SNF placement for continued rehabilitation post-acute D/C in order to maximize independence and safety with ADL. OT will continue to follow acutely.    Follow Up Recommendations  SNF;Supervision/Assistance - 24 hour    Equipment Recommendations  None recommended by OT    Recommendations for Other Services       Precautions / Restrictions Precautions Precautions: Fall Restrictions Weight Bearing Restrictions: No      Mobility Bed Mobility Overal bed mobility: Needs Assistance Bed Mobility: Supine to Sit     Supine to sit: Supervision     General bed mobility comments: Increased time with supervision for  safety.  Transfers Overall transfer level: Needs assistance Equipment used: Rolling walker (2 wheeled) Transfers: Sit to/from Stand Sit to Stand: Min assist;+2 safety/equipment         General transfer comment: Min assist for steadying during rise to stand. Decreased balance once standing.     Balance Overall balance assessment: Needs assistance Sitting-balance support: No upper extremity supported;Feet supported Sitting balance-Leahy Scale: Fair   Postural control: Right lateral lean;Other (comment) (Forward lean) Standing balance support: Bilateral upper extremity supported;Single extremity supported;During functional activity Standing balance-Leahy Scale: Poor Standing balance comment: Reliant on UE support and external assistance. Mod assist +2 for dynamic standing.                            ADL either performed or assessed with clinical judgement   ADL Overall ADL's : Needs assistance/impaired Eating/Feeding: Set up;Sitting   Grooming: Minimal assistance;Standing   Upper Body Bathing: Supervision/ safety;Set up;Sitting   Lower Body Bathing: Minimal assistance;Sit to/from stand   Upper Body Dressing : Supervision/safety;Set up;Sitting   Lower Body Dressing: Sit to/from stand;Minimal assistance   Toilet Transfer: Moderate assistance;+2 for safety/equipment;Ambulation;RW;BSC   Toileting- Clothing Manipulation and Hygiene: Moderate assistance;Sit to/from stand       Functional mobility during ADLs: Moderate assistance;+2 for safety/equipment;Rolling walker General ADL Comments: Pt with significant ataxia during mobility limiting safety. R and forward lean requiring physical assistance to correct.      Vision Patient Visual Report: No change from baseline Vision Assessment?: Yes Eye Alignment: Within Functional Limits Ocular Range of Motion: Within Functional Limits Alignment/Gaze Preference: Within Defined Limits Tracking/Visual Pursuits: Able to  track stimulus in all quads without difficulty Saccades: Within functional limits Convergence: Within functional limits Visual Fields: No  apparent deficits Additional Comments: Pt reporting that no visual changes have occurred.      Perception     Praxis      Pertinent Vitals/Pain Pain Assessment: No/denies pain     Hand Dominance Right   Extremity/Trunk Assessment Upper Extremity Assessment Upper Extremity Assessment: Generalized weakness   Lower Extremity Assessment Lower Extremity Assessment: Defer to PT evaluation       Communication Communication Communication: HOH   Cognition Arousal/Alertness: Awake/alert Behavior During Therapy: WFL for tasks assessed/performed Overall Cognitive Status: Impaired/Different from baseline Area of Impairment: Orientation;Attention;Memory;Following commands;Safety/judgement;Awareness;Problem solving                 Orientation Level: Disoriented to;Time;Situation Current Attention Level: Selective Memory: Decreased short-term memory Following Commands: Follows one step commands consistently;Follows one step commands with increased time Safety/Judgement: Decreased awareness of safety;Decreased awareness of deficits Awareness: Emergent Problem Solving: Slow processing;Difficulty sequencing;Requires verbal cues General Comments: Pt with decreased sequencing abilities during functional mobility making maneuvering RW difficult.   General Comments       Exercises     Shoulder Instructions      Home Living Family/patient expects to be discharged to:: Private residence Living Arrangements: Spouse/significant other;Children Available Help at Discharge: Family;Available PRN/intermittently Type of Home: House Home Access: Stairs to enter [employer] of Steps: 2 Entrance Stairs-Rails: Right Home Layout: One level     Bathroom Shower/Tub: Tub/shower unit;Door   [employer] Toilet: Standard Bathroom Accessibility:  Yes How Accessible: Accessible via walker Home Equipment: Walker - 2 wheels;Cane - single point;Bedside commode;Shower seat      Lives With: Spouse;Son    Prior Functioning/Environment Level of Independence: Independent with assistive device(s)        Comments: was getting around with RW without A until recent CVA        OT Problem List: Decreased strength;Decreased activity tolerance;Impaired balance (sitting and/or standing);Decreased safety awareness;Decreased knowledge of use of DME or AE;Decreased knowledge of precautions;Decreased cognition;Decreased coordination      OT Treatment/Interventions: Self-care/ADL training;Therapeutic exercise;Energy conservation;DME and/or AE instruction;Therapeutic activities;Visual/perceptual remediation/compensation;Patient/family education;Balance training    OT Goals(Current goals can be found in the care plan section) Acute Rehab OT Goals Patient Stated Goal: to go home OT Goal Formulation: With patient/family Time For Goal Achievement: 01/21/17 Potential to Achieve Goals: Good ADL Goals Pt Will Perform Grooming: with supervision;standing Pt Will Perform Upper Body Dressing: with modified independence;sitting Pt Will Perform Lower Body Dressing: with supervision;sit to/from stand Pt Will Transfer to Toilet: with supervision;ambulating;bedside commode Pt Will Perform Toileting - Clothing Manipulation and hygiene: with supervision;sit to/from stand Pt Will Perform Tub/Shower Transfer: with supervision;ambulating;Tub transfer;shower seat;rolling walker Pt/caregiver will Perform Home Exercise Program: Both right and left upper extremity;With Supervision;With written HEP provided;Increased strength Additional ADL Goal #1: Pt will demonstrate emergent awareness during morning ADL routine in minimally distracting environment.   OT Frequency: Min 2X/week   Barriers to D/C:            Co-evaluation              AM-PAC PT "6 Clicks"  Daily Activity     Outcome Measure Help from another person eating meals?: A Little Help from another person taking care of personal grooming?: A Little Help from another person toileting, which includes using toliet, bedpan, or urinal?: A Lot Help from another person bathing (including washing, rinsing, drying)?: A Lot Help from another person to put on and taking off regular upper body clothing?: A Little Help from another person to  put on and taking off regular lower body clothing?: A Little 6 Click Score: 16   End of Session Equipment Utilized During Treatment: Gait belt;Rolling walker Nurse Communication: Mobility status;Precautions  Activity Tolerance: Patient tolerated treatment well Patient left: in chair;with call bell/phone within reach  OT Visit Diagnosis: Unsteadiness on feet (R26.81);Muscle weakness (generalized) (M62.81);Ataxia, unspecified (R27.0)                Time: 1101-1130 OT Time Calculation (min): 29 min Charges:  OT General Charges $OT Visit: 1 Procedure OT Evaluation $OT Eval Moderate Complexity: 1 Procedure G-Codes:     Norman Herrlich, MS OTR/L  Pager: [place] A Ferrell Flam 01/07/2017, 3:44 PM

## 2017-01-07 NOTE — Progress Notes (Signed)
STROKE TEAM PROGRESS NOTE   HISTORY OF PRESENT ILLNESS (per record) Thomas [name] is a 83 y.o. male with recent occipital stroke who presents with 2 days of worsening general malaise/dizziness with acute worsening of facial weakness/dysarthria today around 11 AM. Of note, he is currently being referred to hospice with expectation of expectancy less than 6 months due to malignant mesothelioma.  The family has a meeting with palliative care set up for tomorrow, but has not yet had many discussions regarding limitations of care.  CT head was performed which demonstrates a large PICA infarct, CTA was performed which demonstrates distal vertebral occlusion with retrograde basilar filling. At this time, I suspect that he is at very high risk of worsening, but I think that endovascular intervention in this setting could carry significant risk as well and especially in someone with his current medical problems would be fairly aggressive.  I discussed with family various options indicating that I was not certain which would be superior, but that I favored observation with the understanding that there was potential for significant worsening. I discussed with him that if he does have significant worsening, he would likely be a debilitating stroke and in the setting would he want intubation, and both the patient and the family agreed that he would not want intubation in the setting.  LKW: 2 days ago tpa given?: no, anticoagulation/outside of window Modified Rankin Score: 2   SUBJECTIVE (INTERVAL HISTORY) His daughters, sons and wife are at bedside. His speech has improved as has his coordination. Reviewed MRI imaging with family members which showed acute right cerebellar and pontine infarcts   OBJECTIVE Temp:  [97.6 F (36.4 C)-98.6 F (37 C)] 98.4 F (36.9 C) (05/27 1237) Pulse Rate:  [54-124] 76 (05/27 1413) Cardiac Rhythm: Atrial flutter (05/27 0700) Resp:  [17-28] 17 (05/27 1413) BP:  (115-200)/(88-120) 164/94 (05/27 1413) SpO2:  [96 %-100 %] 97 % (05/27 1413)  CBC:   Recent Labs Lab 01/05/17 1214 01/05/17 1226  WBC  --  5.7  NEUTROABS  --  3.8  HGB 13.3 12.8*  HCT 39.0 39.0  MCV  --  86.3  PLT  --  562    Basic Metabolic Panel:   Recent Labs Lab 01/05/17 1226 01/07/17 0343  NA 136 137  K 3.1* 3.8  CL 103 107  CO2 20* 22  GLUCOSE 126* 130*  BUN 17 18  CREATININE 1.28* 1.10  CALCIUM 9.1 8.7*    Lipid Panel:     Component Value Date/Time   CHOL 131 01/06/2017 0213   TRIG 91 01/06/2017 0213   HDL 28 (L) 01/06/2017 0213   CHOLHDL 4.7 01/06/2017 0213   VLDL 18 01/06/2017 0213   LDLCALC 85 01/06/2017 0213   HgbA1c:  Lab Results  Component Value Date   HGBA1C 6.9 (H) 01/06/2017   Urine Drug Screen:     Component Value Date/Time   LABOPIA NONE DETECTED 01/05/2017 1920   COCAINSCRNUR NONE DETECTED 01/05/2017 1920   LABBENZ NONE DETECTED 01/05/2017 1920   AMPHETMU NONE DETECTED 01/05/2017 1920   THCU NONE DETECTED 01/05/2017 1920   LABBARB NONE DETECTED 01/05/2017 1920    Alcohol Level     Component Value Date/Time   ETH <5 01/05/2017 1226    IMAGING  Ct Angio Head W Or Wo Contrast Ct Angio Neck W Or Wo Contrast 01/05/2017 1. Interval occlusion of the right V3 and V4 segments with intermittent tenuous blood flow in the basilar and bilateral posterior cerebral arteries. There  is underlying severe right V4 stenoses by prior MRA. (left prevertebral artery ends in PICA and there is no significant posterior communicating arteries).  2. Advanced intracranial atherosclerosis with high-grade right cavernous and distal M1 segment stenoses.  3. Severe atherosclerosis of the bilateral cervical carotids with left mid ICA intimal flap causing ~50% narrowing.  4. Acute to subacute right cerebellar infarct.  5. Complex left pleural effusion with history of mesothelioma.    Ct Head Code Stroke Wo Contrast 01/05/2017 1. Acute to subacute infarct in  the inferior right cerebellum.  2. Small to moderate remote infarcts in the bilateral occipital lobes.  3. Diffuse chronic small vessel ischemia in the cerebral white matter.    MRI Brain Wo Contrast 01/06/2017 Acute RIGHT cerebellar and pontine infarcts (including RIGHT PICA), with petechial hemorrhage. Otherwise stable appearance including old bilateral occipital lobe, LEFT frontal and LEFT parietal lobe infarcts, moderate to severe chronic small vessel ischemic disease.    PHYSICAL EXAM  Physical exam: Exam: Gen: NAD                   CV: RRR, no MRG. No Carotid Bruits. No peripheral edema, warm, nontender Eyes: Conjunctivae clear without exudates or hemorrhage  Neuro: Detailed Neurologic Exam  Speech:    Speech is minimally dysarthric, significantly improved today  Cognition:    The patient is oriented to person, place, month, not year,     recent and remote memory impaired;    Cranial Nerves:    The pupils are equal, round, and reactive to light. Visual fields are full to threat. Right esotropia but xtraocular movements are intact. Trigeminal sensation is intact and the muscles of mastication are normal. Right facial droop. The palate elevates in the midline. Hearing intact. Voice is normal. Shoulder shrug is normal. The tongue has normal motion without fasciculations.   Coordination: right-sided dysmetria which is improved today significantly  Motor Observation:    no involuntary movements noted. Tone:    Normal muscle tone.    Strength:    Strength is intact and symmetric in the upper and lower limbs.      Sensation: intact to LT      ASSESSMENT/PLAN Thomas [name] is a 83 y.o. male with history of prostate cancer, hypertension, paroxysmal atrial fibrillation on Eliquis prior to admission, carotid artery occlusion, and malignant mesothelioma with DO NOT RESUSCITATE order presenting with facial weakness, dysarthria, malaise, and dizziness. He did not receive  IV t-PA due to anticoagulation.  Stroke:  PICA infarct - embolic secondary to atrial fibrillation.  Resultant  Dysmetria, dysarthria, hemiplegia which has considerably improved  CT head - Acute to subacute infarct in the inferior right cerebellum.   MRI head - Acute RIGHT cerebellar and pontine infarcts, with petechial hemorrhage.  MRA head not performed  CTA H&N - Interval occlusion of the right V3 and V4 segments with intermittent tenuous blood flow in the basilar and bilateral posterior cerebral arteries  Carotid Doppler - CTA neck  2D Echo - EF 50-55%. No cardiac source of emboli identified.  LDL - 85  HgbA1c - 6.9  VTE prophylaxis - subcutaneous heparin and SCDs DIET DYS 3 Room service appropriate? Yes; Fluid consistency: Nectar Thick  Eliquis (apixaban) daily prior to admission, now on aspirin 325 mg daily. Given the size of the posterior stroke, Eliquis discontinued at this time and may be restarted again in 2 weeks. Also may consider asa 81mg  in addition to the Eliquis.  Patient counseled to be compliant  with his antithrombotic medications  Ongoing aggressive stroke risk factor management  Therapy recommendations: pending  Disposition: Pending  Hypertension  Stable - but on the high side.  Permissive hypertension (OK if < 220/120) but gradually normalize in 5-7 days  Long-term BP goal normotensive  Hyperlipidemia  Home meds:  Crestor 10 mg daily resumed in hospital  LDL 85, goal < 70  Continue statin at discharge   Other Stroke Risk Factors  Advanced age  Former cigarette smoker quit 63 years ago.  ETOH use, advised to drink no more than 1 drink per day.  Hx stroke/TIA - remote infarcts on head CT.  Atrial fibrillation  Other Active Problems  Palliative care following  Hypokalemia - 3.1 -> 3.8  Mildly elevated creatinine 1.28 -> 1.10  Hospital day # 2  Personally examined patient and images, and have participated in and made any  corrections needed to history, physical, neuro exam,assessment and plan as stated above.  I have personally obtained the history, evaluated lab date, reviewed imaging studies and agree with radiology interpretations.    Sarina Ill, MD Stroke Neurology  To contact Stroke Continuity provider, please refer to [email or web address] After hours, contact General Neurology

## 2017-01-07 NOTE — Progress Notes (Signed)
   01/07/17 0137  Vitals  BP (!) 191/120  MAP (mmHg) 138  BP Location Left Arm  BP Method Automatic  Patient Position (if appropriate) Sitting  Pulse Rate (!) 54  Pulse Rate Source Dinamap  Resp 20  Oxygen Therapy  SpO2 97 %  O2 Device Room Air  Pain Assessment  Pain Assessment No/denies pain     01/07/17 0137  Vitals  BP (!) 191/120  MAP (mmHg) 138  BP Location Left Arm  BP Method Automatic  Patient Position (if appropriate) Sitting  Pulse Rate (!) 54  Pulse Rate Source Dinamap  Resp 20  Oxygen Therapy  SpO2 97 %  O2 Device Room Air  Pain Assessment  Pain Assessment No/denies pain   Neurology paged.

## 2017-01-08 DIAGNOSIS — E1159 Type 2 diabetes mellitus with other circulatory complications: Secondary | ICD-10-CM

## 2017-01-08 DIAGNOSIS — I480 Paroxysmal atrial fibrillation: Secondary | ICD-10-CM

## 2017-01-08 DIAGNOSIS — K222 Esophageal obstruction: Secondary | ICD-10-CM

## 2017-01-08 DIAGNOSIS — J90 Pleural effusion, not elsewhere classified: Secondary | ICD-10-CM

## 2017-01-08 DIAGNOSIS — I639 Cerebral infarction, unspecified: Secondary | ICD-10-CM

## 2017-01-08 DIAGNOSIS — I69391 Dysphagia following cerebral infarction: Secondary | ICD-10-CM

## 2017-01-08 DIAGNOSIS — D62 Acute posthemorrhagic anemia: Secondary | ICD-10-CM

## 2017-01-08 DIAGNOSIS — I10 Essential (primary) hypertension: Secondary | ICD-10-CM | POA: Diagnosis present

## 2017-01-08 NOTE — Plan of Care (Signed)
Problem: Nutrition: Goal: Dietary intake will improve Outcome: Not Progressing Patient unable to eat more than a few bits, aproximatly 10-20%, due to vausea/vomitting. Patient states nothing tastes good. Malnutrition screen completed. Patient family provided Boost.

## 2017-01-08 NOTE — Consult Note (Signed)
Physical Medicine and Rehabilitation Consult  Reason for Consult: Stroke with ataxia and difficulty walking Referring Physician: Dr. Leonie Man   HPI: Thomas [name] is a 83 y.o. 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  History taken from chart review and family. CTA head/neck showed acute to subacute right cerebellar infarct with interval occlusion of right V3 and V4 segments with intermittent tenuous blood flow in basilar and bilateral PCA and severe atherosclerosis of bilateral cervical carotids with left mid ICA intimal flap causing 50% stenosis.  Eliquis changed to ASA due to size of stroke with recommendations to resume in two weeks. He was made NPO due to severe aspiration risk and MBS done revealing delayed swallow with sensed aspiration of thins--dysphagia 3, nectar recommended.  MRI brain reviewed, showing right cerebellar and pontine infarct. Per report, acute right cerebellar and pontine infarcts with petechial hemorrhage and stable old bilateral occipital and left frontal/parietal infarcts. Diet advanced to thin liquids today.  Patient with resultant balance deficits with ataxia and CIR recommended for follow up therapy.   Was independent and driving prior to May and did not require AD.  He was able to sit up in a chair for 2-3 hours today.   Review of Systems  HENT: Positive for hearing loss.   Eyes: Negative for blurred vision and double vision.  Respiratory: Negative for cough and shortness of breath.   Cardiovascular: Negative for chest pain and palpitations.  Gastrointestinal: Negative for abdominal pain, heartburn and nausea.  Genitourinary: Negative for dysuria and urgency.  Musculoskeletal: Negative for back pain and myalgias.  Neurological: Positive for sensory  change and weakness. Negative for dizziness and headaches.  Psychiatric/Behavioral: Positive for memory loss. The patient is not nervous/anxious.   All other systems reviewed and are negative.     Past Medical History:  Diagnosis Date  . Adenomatous colon polyp 2009  . Allergy   . Carotid artery occlusion   . Diverticulosis of colon (without mention of hemorrhage)   . ED (erectile dysfunction)   . Esophageal stricture   . Esophagitis   . GERD (gastroesophageal reflux disease)   . Goals of care, counseling/discussion 12/23/2016  . Hiatal hernia   . Hypertension   . Prostate cancer ([place])   . Unspecified gastritis and gastroduodenitis without mention of hemorrhage     Past Surgical History:  Procedure Laterality Date  . ENDARTERECTOMY Left 03/05/2013   Procedure: ENDARTERECTOMY CAROTID;  Surgeon: Conrad Dubois, MD;  Location: [facility];  Service: Vascular;  Laterality: Left;  . ESOPHAGOGASTRODUODENOSCOPY (EGD) WITH ESOPHAGEAL DILATION    . PROSTATE SURGERY     Laser surgery    Family History  Problem Relation Age of Onset  . Heart disease Father        unclear specifics  . Cancer Mother   . Breast cancer Sister   . Hyperlipidemia Daughter   . Hypertension Daughter   . Hypertension Son     Social History:  Married. Independent and using a walker since last stroke. Per reports that he quit smoking about 63 years ago. His smoking use included Cigarettes. He has a 2.00 pack-year smoking history. He has never used smokeless tobacco. He reports that he drinks alcohol. He reports that he does not use drugs.     Allergies  Allergen Reactions  . Ace  Inhibitors Hives  . Penicillins Swelling    Swelling  of the face. Has patient had a PCN reaction causing immediate rash, facial/tongue/throat swelling, SOB or lightheadedness with hypotension: YES Has patient had a PCN reaction causing severe rash involving mucus membranes or skin necrosis: NO Has patient had a PCN reaction that  required hospitalizationNO Has patient had a PCN reaction occurring within the last 10 years: NO If all of the above answers are "NO", then may proceed with Cephalosporin use.    Medications Prior to Admission  Medication Sig Dispense Refill  . amLODipine (NORVASC) 5 MG tablet Take 1 tablet (5 mg total) by mouth daily. 30 tablet 3  . apixaban (ELIQUIS) 5 MG TABS tablet Take 5 mg by mouth 2 (two) times daily.    . cloNIDine (CATAPRES) 0.1 MG tablet TAKE ONE TABLET BY MOUTH THREE TIMES DAILY 90 tablet 4  . metoCLOPramide (REGLAN) 5 MG tablet Take 1 tablet (5 mg total) by mouth every 8 (eight) hours as needed for nausea. 15 tablet 0  . rosuvastatin (CRESTOR) 10 MG tablet Take 1 tablet (10 mg total) by mouth daily. 92 tablet 3  . meclizine (ANTIVERT) 25 MG tablet Take 1 tablet (25 mg total) by mouth 3 (three) times daily as needed for dizziness. (Patient not taking: Reported on 01/05/2017) 30 tablet 0    Home: [place] expects to be discharged to:: Private residence Living Arrangements: Spouse/significant other, Children Available Help at Discharge: Family, Available PRN/intermittently Type of Home: House Home Access: Stairs to enter [employer] of Steps: 2 Entrance Stairs-Rails: Right Home Layout: One level Bathroom Shower/Tub: Tub/shower unit, Door [employer] Toilet: Standard Bathroom Accessibility: Yes Home Equipment: Environmental consultant - 2 wheels, Cane - single point, Bedside commode, Shower seat  Lives With: Spouse, Son  Functional History: Prior Function Level of Independence: Independent with assistive device(s) Comments: was getting around with RW without A until recent CVA Functional Status:  Mobility: Bed Mobility Overal bed mobility: Needs Assistance Bed Mobility: Supine to Sit Supine to sit: Min guard General bed mobility comments: Increased time with close guard at the trunk for safety. Poor sequencing, but able to correct with time.  Transfers Overall  transfer level: Needs assistance Equipment used: Rolling walker (2 wheeled) Transfers: Sit to/from Stand Sit to Stand: Min assist General transfer comment: VC's for hand placement on seated surface for safety. Pt required assist for balance support and to recover from large LOB upon first stand.  Ambulation/Gait Ambulation/Gait assistance: Mod assist, +2 safety/equipment Ambulation Distance (Feet): 100 Feet Assistive device: Rolling walker (2 wheeled) Gait Pattern/deviations: Ataxic, Decreased step length - right, Decreased step length - left, Step-through pattern, Drifts right/left, Trunk flexed General Gait Details: With RW, pt was able to ambulate with improved gait and balance and mod assist from therapist for LOB to the right. Occasional repositioning of walker was required as well to maintain safe distance. Gait velocity: Decreased Gait velocity interpretation: Below normal speed for age/gender    ADL: ADL Overall ADL's : Needs assistance/impaired Eating/Feeding: Set up, Sitting Grooming: Minimal assistance, Standing Upper Body Bathing: Supervision/ safety, Set up, Sitting Lower Body Bathing: Minimal assistance, Sit to/from stand Upper Body Dressing : Supervision/safety, Set up, Sitting Lower Body Dressing: Sit to/from stand, Minimal assistance Toilet Transfer: Moderate assistance, +2 for safety/equipment, Ambulation, RW, BSC Toileting- Clothing Manipulation and Hygiene: Moderate assistance, Sit to/from stand Functional mobility during ADLs: Moderate assistance, +2 for safety/equipment, Rolling walker General ADL Comments: Pt with significant ataxia during mobility limiting safety.  R and forward lean requiring physical assistance to correct.   Cognition: Cognition Overall Cognitive Status: Impaired/Different from baseline Arousal/Alertness: Awake/alert Orientation Level: Oriented to person, Oriented to place, Disoriented to situation, Disoriented to time Attention:  Sustained Sustained Attention: Impaired Sustained Attention Impairment: Verbal basic, Functional basic Memory: Impaired Memory Impairment: Storage deficit, Retrieval deficit Awareness: Appears intact Problem Solving: Impaired Problem Solving Impairment: Verbal basic Cognition Arousal/Alertness: Awake/alert Behavior During Therapy: WFL for tasks assessed/performed Overall Cognitive Status: Impaired/Different from baseline Area of Impairment: Orientation, Attention, Memory, Following commands, Safety/judgement, Awareness, Problem solving Orientation Level: Disoriented to, Time, Situation Current Attention Level: Selective Memory: Decreased short-term memory, Decreased recall of precautions Following Commands: Follows one step commands consistently, Follows one step commands with increased time Safety/Judgement: Decreased awareness of safety, Decreased awareness of deficits Awareness: Emergent Problem Solving: Slow processing, Difficulty sequencing, Requires verbal cues General Comments: Pt with decreased sequencing abilities during functional mobility making maneuvering RW difficult.   Blood pressure (!) 154/85, pulse 78, temperature 98.9 F (37.2 C), temperature source Oral, resp. rate 20, height 5\' 11"  (1.803 m), weight 72.6 kg (160 lb 0.9 oz), SpO2 98 %. Physical Exam  Nursing note and vitals reviewed. Constitutional: He is oriented to person, place, and time. He appears well-developed and well-nourished.  persistent hiccups noted.   HENT:  Head: Normocephalic and atraumatic.  Mouth/Throat: Oropharynx is clear and moist.  Eyes: Conjunctivae and EOM are normal. Pupils are equal, round, and reactive to light.  Neck: Normal range of motion. Neck supple.  Cardiovascular: Normal rate and regular rhythm.   Respiratory: Effort normal and breath sounds normal. No stridor. No respiratory distress. He has no wheezes.  GI: Soft. Bowel sounds are normal. He exhibits no distension. There is  no tenderness.  Musculoskeletal: He exhibits no edema or tenderness.  Neurological: He is alert and oriented to person, place, and time.  Mild dysarthria.  Able to follow simple commands without difficulty. Dysmetria with right finger to nose.  Sensory deficits RUE and LLE.  Motor: 4+/5 throughout  Skin: Skin is warm and dry.  Psychiatric: He has a normal mood and affect. His behavior is normal. Thought content normal.    No results found for this or any previous visit (from the past 24 hour(s)). Mr Brain Wo Contrast  Result Date: 01/06/2017 CLINICAL DATA:  Two days of worsening dizziness, facial weakness and dysarthria, worsening at 11 a.m. today. On hospice for malignant mesothelioma. Follow-up occipital stroke. EXAM: MRI HEAD WITHOUT CONTRAST TECHNIQUE: Multiplanar, multiecho pulse sequences of the brain and surrounding structures were obtained without intravenous contrast. COMPARISON:  CT angiogram of the head and neck Jan 05, 2017 and MRI of the head Dec 13, 2016 FINDINGS: BRAIN: Confluent reduced diffusion RIGHT inferior cerebellum with low ADC values. Additional scattered subcentimeter foci of reduced diffusion RIGHT pons, RIGHT superior cerebellum. Residual punctate reduced diffusion RIGHT mesial occipital lobe. Faint susceptibility artifact RIGHT cerebellum compatible with petechial hemorrhage. Susceptibility artifact again seen along the RIGHT vertebral artery consistent with known occlusion. Regional mass effect/cytotoxic edema RIGHT inferior cerebellum, RIGHT inferior cerebral peduncle. Bioccipital lobe encephalomalacia again noted. Small area LEFT frontal LEFT parietal lobe encephalomalacia. Ventricles and sulci are overall normal for patient's age. Confluent supratentorial white matter FLAIR T2 hyperintensities. No midline shift or masses. No abnormal extra-axial fluid collections. VASCULAR: Attenuated RIGHT vertebral artery flow void. Maintained anterior circulation. SKULL AND UPPER  CERVICAL SPINE: No abnormal sellar expansion. No suspicious calvarial bone marrow signal. Craniocervical junction maintained. SINUSES/ORBITS: Small RIGHT maxillary mucosal retention  cyst. Small bilateral mastoid effusions. The included ocular globes and orbital contents are non-suspicious. Status post bilateral ocular lens implants. OTHER: Patient is edentulous. IMPRESSION: Acute RIGHT cerebellar and pontine infarcts (including RIGHT PICA), with petechial hemorrhage. Otherwise stable appearance including old bilateral occipital lobe, LEFT frontal and LEFT parietal lobe infarcts, moderate to severe chronic small vessel ischemic disease. Acute findings text paged to [place], Neurology via [employer] secure system on 01/06/2017 at 10:16 pm. Electronically Signed   By: Elon Alas M.D.   On: 01/06/2017 22:18    Assessment/Plan: Diagnosis: Right cerebellar and pontine infarct Labs and images independently reviewed.  Records reviewed and summated above. Stroke: Continue secondary stroke prophylaxis and Risk Factor Modification listed below:   Antiplatelet therapy:  Blood Pressure Management:  Continue current medication with prn's with permisive HTN per primary team Statin Agent:   Diabetes management:    1. Does the need for close, 24 hr/day medical supervision in concert with the patient's rehab needs make it unreasonable for this patient to be served in a less intensive setting? Yes  2. Co-Morbidities requiring supervision/potential complications: HTN (monitor and provide prns in accordance with increased physical exertion and pain), esophageal stricture, PAF (monitor HR with increased activity), recurrent left pleural effusion, malignant mesothelioma (hospice care), dysphagia (advance diet as tolerated), DM (Monitor in accordance with exercise and adjust meds as necessary), ABLA (transfuse if necessary to ensure appropriate perfusion for increased activity tolerance) 3. Due to safety, disease  management and patient education, does the patient require 24 hr/day rehab nursing? Yes 4. Does the patient require coordinated care of a physician, rehab nurse, PT (1-2 hrs/day, 5 days/week), OT (1-2 hrs/day, 5 days/week) and SLP (1-2 hrs/day, 5 days/week) to address physical and functional deficits in the context of the above medical diagnosis(es)? Yes Addressing deficits in the following areas: balance, endurance, locomotion, transferring, bathing, toileting, swallowing and psychosocial support 5. Can the patient actively participate in an intensive therapy program of at least 3 hrs of therapy per day at least 5 days per week? Yes 6. The potential for patient to make measurable gains while on inpatient rehab is excellent 7. Anticipated functional outcomes upon discharge from inpatient rehab are modified independent and supervision  with PT, modified independent and supervision with OT, independent with SLP. 8. Estimated rehab length of stay to reach the above functional goals is: 13-17 days. 9. Anticipated D/C setting: Home 10. Anticipated post D/C treatments: HH therapy and Home excercise program 11. Overall Rehab/Functional Prognosis: fair  RECOMMENDATIONS: This patient's condition is appropriate for continued rehabilitative care in the following setting: CIR Patient has agreed to participate in recommended program. Yes Note that insurance prior authorization may be required for reimbursement for recommended care.  Comment: Rehab Admissions Coordinator to follow up.  Delice Lesch, MD, [address] [place] 01/08/2017

## 2017-01-08 NOTE — Progress Notes (Signed)
Rehab Admissions Coordinator Note:  Patient was screened by Cleatrice Burke for appropriateness for an Inpatient Acute Rehab Consult per change in recommendation by P.T. Today. Noted palliative care involved with noted discussions of life expectancy less than 6 months due to malignant mesothelioma. Recommend an in[pt rehab consult if pt and family would like to be considered for intensive therapy offered with an inpt rehab admission rather than SNF rehab at a less intense pace. Please advise.Cleatrice Burke 01/08/2017, 10:52 AM  I can be reached at [phone].

## 2017-01-08 NOTE — Progress Notes (Signed)
Physical Therapy Treatment Patient Details Name: Thomas [name] MRN: [id number] DOB: 1925/11/29 Today's Date: 01/08/2017    History of Present Illness Pt is an 83 y.o. male with recent B occipital stroke who presented to the ED with 2 days of worsening malaise/dizziness with acute worsening of facial weakness/dysarthria. MRI revealing R cerebellar and pontine infarcts (including R PICA) with petechial hemorrhage and stable old B occipital, L frontal, and L parietal infarcts. PMH significant for recent diagnosis of malignant mesothelioma, adenomatous colon polyp, esophageal stricture, esophagitis, GERD, prostate cancer, hypertension, hiatal hernia, and unspecified gastritis and gastroduodenitis without mention of hemorrhage.     PT Comments    Pt progressing towards physical therapy goals, with improved tolerance for functional activity noted this session. Pt continues to require up to mod assist for balance support and gait technique for safe ambulation. Introduced the walker today and pt could manage well with +1 mod assist and chair follow. Discussed recommendation to update discharge disposition to CIR, and pt/family are agreeable. Feel he will benefit from, and will be able to tolerate, the increased intensity of therapy available versus at a SNF. CIR pre-admissions screen initiated today. Will continue to follow and progress as able per POC.   Follow Up Recommendations  CIR;Supervision/Assistance - 24 hour     Equipment Recommendations  None recommended by PT    Recommendations for Other Services Rehab consult     Precautions / Restrictions Precautions Precautions: Fall Restrictions Weight Bearing Restrictions: No    Mobility  Bed Mobility Overal bed mobility: Needs Assistance Bed Mobility: Supine to Sit     Supine to sit: Min guard     General bed mobility comments: Increased time with close guard at the trunk for safety. Poor sequencing, but able to correct with time.    Transfers Overall transfer level: Needs assistance Equipment used: Rolling walker (2 wheeled) Transfers: Sit to/from Stand Sit to Stand: Min assist         General transfer comment: VC's for hand placement on seated surface for safety. Pt required assist for balance support and to recover from large LOB upon first stand.   Ambulation/Gait Ambulation/Gait assistance: Mod assist;+2 safety/equipment Ambulation Distance (Feet): 100 Feet Assistive device: Rolling walker (2 wheeled) Gait Pattern/deviations: Ataxic;Decreased step length - right;Decreased step length - left;Step-through pattern;Drifts right/left;Trunk flexed Gait velocity: Decreased Gait velocity interpretation: Below normal speed for age/gender General Gait Details: With RW, pt was able to ambulate with improved gait and balance and mod assist from therapist for LOB to the right. Occasional repositioning of walker was required as well to maintain safe distance.   Stairs            Wheelchair Mobility    Modified Rankin (Stroke Patients Only) Modified Rankin (Stroke Patients Only) Pre-Morbid Rankin Score: No symptoms Modified Rankin: Moderately severe disability     Balance Overall balance assessment: Needs assistance Sitting-balance support: No upper extremity supported;Feet supported Sitting balance-Leahy Scale: Fair     Standing balance support: No upper extremity supported Standing balance-Leahy Scale: Fair Standing balance comment: Statically pt was able to stand without UE support. When dynamic challenges were introduced, pt required mod assist for balance support.                             Cognition Arousal/Alertness: Awake/alert Behavior During Therapy: WFL for tasks assessed/performed Overall Cognitive Status: Impaired/Different from baseline Area of Impairment: Orientation;Attention;Memory;Following commands;Safety/judgement;Awareness;Problem solving  Orientation Level: Disoriented to;Time;Situation Current Attention Level: Selective Memory: Decreased short-term memory;Decreased recall of precautions Following Commands: Follows one step commands consistently;Follows one step commands with increased time Safety/Judgement: Decreased awareness of safety;Decreased awareness of deficits Awareness: Emergent Problem Solving: Slow processing;Difficulty sequencing;Requires verbal cues General Comments: Pt with decreased sequencing abilities during functional mobility making maneuvering RW difficult.      Exercises General Exercises - Lower Extremity Quad Sets: 10 reps;Both Gluteal Sets: 10 reps;Both Hip ABduction/ADduction: 10 reps;Both Straight Leg Raises: 10 reps;Both    General Comments        Pertinent Vitals/Pain Pain Assessment: No/denies pain    Home Living                      Prior Function            PT Goals (current goals can now be found in the care plan section) Acute Rehab PT Goals Patient Stated Goal: to go home PT Goal Formulation: With patient/family Time For Goal Achievement: 01/21/17 Potential to Achieve Goals: Fair Progress towards PT goals: Progressing toward goals    Frequency    Min 3X/week      PT Plan Discharge plan needs to be updated    Co-evaluation              AM-PAC PT "6 Clicks" Daily Activity  Outcome Measure  Difficulty turning over in bed (including adjusting bedclothes, sheets and blankets)?: None Difficulty moving from lying on back to sitting on the side of the bed? : A Little Difficulty sitting down on and standing up from a chair with arms (e.g., wheelchair, bedside commode, etc,.)?: Total Help needed moving to and from a bed to chair (including a wheelchair)?: Total Help needed walking in hospital room?: Total Help needed climbing 3-5 steps with a railing? : Total 6 Click Score: 11    End of Session Equipment Utilized During Treatment: Gait  belt Activity Tolerance: Patient limited by fatigue Patient left: in chair;with call bell/phone within reach;with family/visitor present Nurse Communication: Mobility status PT Visit Diagnosis: Ataxic gait (R26.0);Other symptoms and signs involving the nervous system (R29.898)     Time: 0932-1000 PT Time Calculation (min) (ACUTE ONLY): 28 min  Charges:  $Gait Training: 8-22 mins $Therapeutic Exercise: 8-22 mins                    G Codes:       Rolinda Roan, PT, DPT Acute Rehabilitation Services Pager: [phone]    Thelma Comp 01/08/2017, 10:15 AM

## 2017-01-08 NOTE — Clinical Social Work Note (Signed)
Clinical Social Work Assessment  Patient Details  Name: Thomas [name] MRN: [id number] Date of Birth: Jun 07, 1926  Date of referral:  01/08/17               Reason for consult:  Facility Placement, Discharge Planning                Permission sought to share information with:  Family Supports, Chartered certified accountant granted to share information::  Yes, Verbal Permission Granted  Name::     Rich Fuchs  Agency::  SNF  Relationship::  Son, Sales executive Information:     Housing/Transportation Living arrangements for the past 2 months:  Single Family Home Source of Information:  Patient, Adult Children Patient Interpreter Needed:  None Criminal Activity/Legal Involvement Pertinent to Current Situation/Hospitalization:  No - Comment as needed Significant Relationships:  Adult Children, Spouse Lives with:  Self, Spouse Do you feel safe going back to the place where you live?  Yes Need for family participation in patient care:  Yes (Comment) (pt not always fully oriented)  Care giving concerns:  Pt has been living at home with son and spouse, but requires increased care as a result of recent stroke with ongoing symptoms.   Social Worker assessment / plan:  CSW explained SNF referral process to pt, pt's son, and pt's daughter. Pt's son and pt's daughter would prefer following through with updated PT recommendation for CIR, but will be amenable to SNF placement as a second option of CIR is not available. [name] indicated possibly [facility] as she works there, but would like to do research on other facilities and their therapy teams. CSW will follow up with options of facilities if needed.  Employment status:  Retired Nurse, adult PT Recommendations:  Inpatient [place] / Referral to community resources:  [place]  Patient/Family's Response to care:  Pt agreeable to SNF  placement.  Patient/Family's Understanding of and Emotional Response to Diagnosis, Current Treatment, and Prognosis:  Pt seemed to understand current physical limitations and need for increased level of care. Pt's son and pt's daughter both indicated understanding CSW role in discharge planning, and appreciated the assistance in locating SNF facility.   Emotional Assessment Appearance:  Appears stated age Attitude/Demeanor/Rapport:    Affect (typically observed):  Appropriate Orientation:  Oriented to Place, Oriented to Self Alcohol / Substance use:  Not Applicable Psych involvement (Current and /or in the community):  No (Comment)  Discharge Needs  Concerns to be addressed:  Care Coordination, Discharge Planning Concerns Readmission within the last 30 days:  Yes Current discharge risk:  Physical Impairment Barriers to Discharge:  Continued Medical Work up   [employer], [place] 01/08/2017, 1:44 PM

## 2017-01-08 NOTE — Progress Notes (Signed)
  Speech Language Pathology Treatment: Dysphagia  Patient Details Name: Thomas [name] MRN: [id number] DOB: 06-30-1926 Today's Date: 01/08/2017 Time: 3338-3291 SLP Time Calculation (min) (ACUTE ONLY): 17 min  Assessment / Plan / Recommendation Clinical Impression  Pt consumed soft solids and advanced trials of thin liquids with one immediate cough observed. This was felt to be related to a larger bolus of liquid and/or mixed consistency as he had small amounts of food in his mouth. Min cues were provided for smaller sips with no overt signs of aspiration observed, and aspiration was consistently sensed on MBS. Recommend to continue with Dys 3 diet but with advancement to thin liquids, using aspiration precautions and use of small cup sips to increase safety. Will continue to follow.   HPI HPI: 83 y.o. male with history of paroxysmal atrial fibrillation, recently diagnosed mesothelioma with recurrent pleural effusion, history of occipital stroke with hospitalization 12/13/16, GERD, esophageal stricture s/p multiple dilations, esophagitis, hiatal hernia. Presented 01/05/17 with 2 day history of increased weakness, facial drooping, slurred speech. CT showing acute interval extensive infarct in the right cerebellum, predominately inferiorly but also some involvement of the superior hemisphere. MRI pending. Seen by SLP during recent admission for cognitive linguistic evaluation only.       SLP Plan  Continue with current plan of care       Recommendations  Diet recommendations: Dysphagia 3 (mechanical soft);Thin liquid Liquids provided via: Cup;No straw Medication Administration: Whole meds with puree Supervision: Patient able to self feed;Intermittent supervision to cue for compensatory strategies Compensations: Slow rate;Small sips/bites Postural Changes and/or Swallow Maneuvers: Seated upright 90 degrees                Oral Care Recommendations: Oral care BID Follow up Recommendations:  Inpatient Rehab SLP Visit Diagnosis: Dysphagia, oropharyngeal phase (R13.12) Plan: Continue with current plan of care       GO                Germain Osgood 01/08/2017, 4:06 PM  Germain Osgood, M.A. CCC-SLP [phone]

## 2017-01-08 NOTE — NC FL2 (Signed)
[place] LEVEL OF CARE SCREENING TOOL     IDENTIFICATION  Patient Name: Thomas [name] Birthdate: 1925/08/21 Sex: male Admission Date (Current Location): 01/05/2017  [facility] [place] Number:  Herbalist and Address:  [facility], [place] [address] [place], [place] [address]      Provider Number: [id number]  Attending Physician Name and Address:  Garvin Fila, MD  Relative Name and Phone Number:       Current Level of Care: Hospital Recommended Level of Care: [place] Prior Approval Number:    Date Approved/Denied:   PASRR Number: [id number]  Discharge Plan: SNF    Current Diagnoses: Patient Active Problem List   Diagnosis Date Noted  . Mesothelioma of left lung ([place])   . Palliative care by specialist   . Cerebellar infarct ([place])   . Stroke (cerebrum) ([place]) 01/05/2017  . Goals of care, counseling/discussion 12/23/2016  . Encounter for antineoplastic chemotherapy 12/23/2016  . Vertigo following cerebrovascular accident 12/16/2016  . Hyperkalemia 12/16/2016  . Type 2 diabetes mellitus with hyperlipidemia ([employer]) 12/16/2016  . Insomnia 12/07/2016  . Atelectasis 10/30/2016  . Alcohol abuse 08/04/2016  . Permanent atrial fibrillation ([place]) 07/21/2016  . Malignant mesothelioma of pleura ([place]) 02/16/2016  . Perforation of right tympanic membrane 11/24/2013  . Carotid stenosis 02/21/2013  . TIA (transient ischemic attack) 02/17/2013  . GERD (gastroesophageal reflux disease) 05/17/2011  . Dysphagia 05/17/2011  . Stricture and stenosis of esophagus 08/29/2010  . Allergic rhinitis 01/16/2008  . 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 PROSTATE CANCER, HX OF 03/26/2007  . Hyperlipidemia 03/20/2007  . Essential hypertension 03/20/2007    Orientation RESPIRATION BLADDER Height & Weight     Self, Place  Normal External catheter Weight: 160 lb 0.9 oz (72.6 kg) Height:  5\' 11"  (180.3 cm)  BEHAVIORAL  SYMPTOMS/MOOD NEUROLOGICAL BOWEL NUTRITION STATUS      Continent    AMBULATORY STATUS COMMUNICATION OF NEEDS Skin   Limited Assist Verbally Normal                       Personal Care Assistance Level of Assistance  Bathing, Dressing Bathing Assistance: Limited assistance   Dressing Assistance: Limited assistance     Functional Limitations Info             SPECIAL CARE FACTORS FREQUENCY  PT (By licensed PT), OT (By licensed OT)     PT Frequency: 5x/wk OT Frequency: 5x/wk            Contractures      Additional Factors Info  Code Status, Allergies Code Status Info: DNR Allergies Info: Ace Inhibitors, Penicillins           Current Medications (01/08/2017):  This is the current hospital active medication list Current Facility-Administered Medications  Medication Dose Route Frequency Provider Last Rate Last Dose  . 0.9 %  sodium chloride infusion   Intravenous Continuous Greta Doom, MD 75 mL/hr at 01/07/17 2154    . acetaminophen (TYLENOL) tablet 650 mg  650 mg Oral Q4H PRN Greta Doom, MD       Or  . acetaminophen (TYLENOL) solution 650 mg  650 mg Per Tube Q4H PRN Greta Doom, MD       Or  . acetaminophen (TYLENOL) suppository 650 mg  650 mg Rectal Q4H PRN Greta Doom, MD      . amLODipine (NORVASC) tablet 5 mg  5 mg Oral  Daily Kerney Elbe, MD   5 mg at 01/08/17 0931  . aspirin EC tablet 325 mg  325 mg Oral Daily Rinehuls, David L, PA-C   325 mg at 01/08/17 0931  . food thickener (THICK IT) powder   Oral PRN Greta Doom, MD      . heparin injection 5,000 Units  5,000 Units Subcutaneous Q8H Greta Doom, MD   5,000 Units at 01/08/17 [phone]  . labetalol (NORMODYNE,TRANDATE) injection 10 mg  10 mg Intravenous Q20 Min PRN Greta Doom, MD      . ondansetron [facility]) injection 4 mg  4 mg Intravenous Q6H PRN Rinehuls, David L, PA-C   4 mg at 01/07/17 9604  . potassium chloride (KLOR-CON)  packet 20 mEq  20 mEq Oral BID Rinehuls, David L, PA-C   20 mEq at 01/08/17 0931  . rosuvastatin (CRESTOR) tablet 10 mg  10 mg Oral Daily Rinehuls, David L, PA-C   10 mg at 01/08/17 5409     Discharge Medications: Please see discharge summary for a list of discharge medications.  Relevant Imaging Results:  Relevant Lab Results:   Additional Information SS#: [id number]  Geralynn Ochs, LCSW  I have personally examined this patient, reviewed notes, independently viewed imaging studies, participated in medical decision making and plan of care.ROS completed by me personally and pertinent positives fully documented  I have made any additions or clarifications directly to the above note. Agree with note above.   Antony Contras, MD Medical Director [facility] Pager: [phone] 01/08/2017 3:53 PM

## 2017-01-08 NOTE — Progress Notes (Signed)
STROKE TEAM PROGRESS NOTE   HISTORY OF PRESENT ILLNESS (per record) Thomas [name] is a 83 y.o. male with recent occipital stroke who presents with 2 days of worsening general malaise/dizziness with acute worsening of facial weakness/dysarthria today around 11 AM. Of note, he is currently being referred to hospice with expectation of expectancy less than 6 months due to malignant mesothelioma.  The family has a meeting with palliative care set up for tomorrow, but has not yet had many discussions regarding limitations of care.  CT head was performed which demonstrates a large PICA infarct, CTA was performed which demonstrates distal vertebral occlusion with retrograde basilar filling. At this time, I suspect that he is at very high risk of worsening, but I think that endovascular intervention in this setting could carry significant risk as well and especially in someone with his current medical problems would be fairly aggressive.  I discussed with family various options indicating that I was not certain which would be superior, but that I favored observation with the understanding that there was potential for significant worsening. I discussed with him that if he does have significant worsening, he would likely be a debilitating stroke and in the setting would he want intubation, and both the patient and the family agreed that he would not want intubation in the setting.  LKW: 2 days ago tpa given?: no, anticoagulation/outside of window Modified Rankin Score: 2   SUBJECTIVE (INTERVAL HISTORY) His daughters, sons and wife are at bedside. His speech has improved as has his coordination. Family and now wants to be more aggressive with his stroke care and want inpatient rehabilitation   OBJECTIVE Temp:  [97.8 F (36.6 C)-98.9 F (37.2 C)] 98.9 F (37.2 C) (05/28 1335) Pulse Rate:  [67-87] 78 (05/28 1335) Cardiac Rhythm: Supraventricular tachycardia (05/28 0946) Resp:  [20-28] 20 (05/28  1335) BP: (139-206)/(85-116) 154/85 (05/28 1335) SpO2:  [96 %-99 %] 98 % (05/28 1335)  CBC:   Recent Labs Lab 01/05/17 1214 01/05/17 1226  WBC  --  5.7  NEUTROABS  --  3.8  HGB 13.3 12.8*  HCT 39.0 39.0  MCV  --  86.3  PLT  --  902    Basic Metabolic Panel:   Recent Labs Lab 01/05/17 1226 01/07/17 0343  NA 136 137  K 3.1* 3.8  CL 103 107  CO2 20* 22  GLUCOSE 126* 130*  BUN 17 18  CREATININE 1.28* 1.10  CALCIUM 9.1 8.7*    Lipid Panel:     Component Value Date/Time   CHOL 131 01/06/2017 0213   TRIG 91 01/06/2017 0213   HDL 28 (L) 01/06/2017 0213   CHOLHDL 4.7 01/06/2017 0213   VLDL 18 01/06/2017 0213   LDLCALC 85 01/06/2017 0213   HgbA1c:  Lab Results  Component Value Date   HGBA1C 6.9 (H) 01/06/2017   Urine Drug Screen:     Component Value Date/Time   LABOPIA NONE DETECTED 01/05/2017 1920   COCAINSCRNUR NONE DETECTED 01/05/2017 1920   LABBENZ NONE DETECTED 01/05/2017 1920   AMPHETMU NONE DETECTED 01/05/2017 1920   THCU NONE DETECTED 01/05/2017 1920   LABBARB NONE DETECTED 01/05/2017 1920    Alcohol Level     Component Value Date/Time   ETH <5 01/05/2017 1226    IMAGING  Ct Angio Head W Or Wo Contrast Ct Angio Neck W Or Wo Contrast 01/05/2017 1. Interval occlusion of the right V3 and V4 segments with intermittent tenuous blood flow in the basilar and bilateral posterior cerebral  arteries. There is underlying severe right V4 stenoses by prior MRA. (left prevertebral artery ends in PICA and there is no significant posterior communicating arteries).  2. Advanced intracranial atherosclerosis with high-grade right cavernous and distal M1 segment stenoses.  3. Severe atherosclerosis of the bilateral cervical carotids with left mid ICA intimal flap causing ~50% narrowing.  4. Acute to subacute right cerebellar infarct.  5. Complex left pleural effusion with history of mesothelioma.    Ct Head Code Stroke Wo Contrast 01/05/2017 1. Acute to subacute  infarct in the inferior right cerebellum.  2. Small to moderate remote infarcts in the bilateral occipital lobes.  3. Diffuse chronic small vessel ischemia in the cerebral white matter.    MRI Brain Wo Contrast 01/06/2017 Acute RIGHT cerebellar and pontine infarcts (including RIGHT PICA), with petechial hemorrhage. Otherwise stable appearance including old bilateral occipital lobe, LEFT frontal and LEFT parietal lobe infarcts, moderate to severe chronic small vessel ischemic disease.    PHYSICAL EXAM  Physical exam: Exam: Gen: Pleasant elderly African-American male not in distress                  CV: RRR, no MRG. No Carotid Bruits. No peripheral edema, warm, nontender Eyes: Conjunctivae clear without exudates or hemorrhage  Neuro: Detailed Neurologic Exam  Speech:    Speech is minimally dysarthric, and can speak sentences clearly Cognition:    The patient is oriented to person, place, month, not year,     recent and remote memory impaired;    Cranial Nerves:    The pupils are equal, round, and reactive to light. Visual fields are full to threat. Right esotropia but xtraocular movements are intact. Trigeminal sensation is intact and the muscles of mastication are normal. Right facial droop. The palate elevates in the midline. Hearing intact. Voice is normal. Shoulder shrug is normal. The tongue has normal motion without fasciculations.   Coordination: right-sided dysmetria which is improved today significantly  Motor Observation:    no involuntary movements noted. Tone:    Normal muscle tone.    Strength:    Strength is intact and symmetric in the upper and lower limbs.      Sensation: intact to LT Gait deferred      ASSESSMENT/PLAN [name] is a 83 y.o. male with history of prostate cancer, hypertension, paroxysmal atrial fibrillation on Eliquis prior to admission, carotid artery occlusion, and malignant mesothelioma with DO NOT RESUSCITATE order presenting  with facial weakness, dysarthria, malaise, and dizziness. He did not receive IV t-PA due to anticoagulation.  Stroke:  PICA infarct - embolic secondary to atrial fibrillation.  Resultant  Dysmetria, dysarthria, hemiplegia which has considerably improved  CT head - Acute to subacute infarct in the inferior right cerebellum.   MRI head - Acute RIGHT cerebellar and pontine infarcts, with petechial hemorrhage.  MRA head not performed  CTA H&N - Interval occlusion of the right V3 and V4 segments with intermittent tenuous blood flow in the basilar and bilateral posterior cerebral arteries  Carotid Doppler - CTA neck  2D Echo - EF 50-55%. No cardiac source of emboli identified.  LDL - 85  HgbA1c - 6.9  VTE prophylaxis - subcutaneous heparin and SCDs DIET DYS 3 Room service appropriate? Yes; Fluid consistency: Nectar Thick  Eliquis (apixaban) daily prior to admission, now on aspirin 325 mg daily. Given the size of the posterior stroke, Eliquis discontinued at this time and may be restarted again in 2 weeks. Also may consider asa 81mg  in  addition to the Eliquis.  Patient counseled to be compliant with his antithrombotic medications  Ongoing aggressive stroke risk factor management  Therapy recommendations: pending  Disposition: Pending  Hypertension  Stable - but on the high side.  Permissive hypertension (OK if < 220/120) but gradually normalize in 5-7 days  Long-term BP goal normotensive  Hyperlipidemia  Home meds:  Crestor 10 mg daily resumed in hospital  LDL 85, goal < 70  Continue statin at discharge   Other Stroke Risk Factors  Advanced age  Former cigarette smoker quit 63 years ago.  ETOH use, advised to drink no more than 1 drink per day.  Hx stroke/TIA - remote infarcts on head CT.  Atrial fibrillation  Other Active Problems  Palliative care following  Hypokalemia - 3.1 -> 3.8  Mildly elevated creatinine 1.28 -> 1.10  Hospital day #  3  Personally examined patient and images, and have participated in and made any corrections needed to history, physical, neuro exam,assessment and plan as stated above.  I have personally obtained the history, evaluated lab date, reviewed imaging studies and agree with radiology interpretations.  I had a long discussion the patient and multiple family members about atrial fibrillation and his recurrent stroke on eliquis and discuss the alternative treatment options and lack of definite data suggesting switching eliquis to Xarelto, Pradaxa or Sayvasa being any superior. Family understands and want to continue eliquis for now. Agree with inpatient rehabilitation consult. Medically stable for transfer to rehabilitation when bed available greater than 50% time during this 25 minute visit was spent on counseling and coordination of care about his atrial fibrillation, recurrent stroke, discussion about treatment options and answered questions.  Antony Contras, MD Stroke Neurology  To contact Stroke Continuity provider, please refer to [email or web address] After hours, contact General Neurology

## 2017-01-09 ENCOUNTER — Encounter (HOSPITAL_COMMUNITY): Payer: Self-pay | Admitting: *Deleted

## 2017-01-09 ENCOUNTER — Inpatient Hospital Stay (HOSPITAL_COMMUNITY)
Admission: RE | Admit: 2017-01-09 | Discharge: 2017-01-18 | DRG: 057 | Disposition: A | Discharge status: Other Acute Inpatient Hospital | Payer: Medicare Other | Source: Intra-hospital | Attending: Physical Medicine & Rehabilitation | Admitting: Physical Medicine & Rehabilitation

## 2017-01-09 DIAGNOSIS — Z87891 Personal history of nicotine dependence: Secondary | ICD-10-CM | POA: Diagnosis not present

## 2017-01-09 DIAGNOSIS — R1310 Dysphagia, unspecified: Secondary | ICD-10-CM | POA: Diagnosis present

## 2017-01-09 DIAGNOSIS — E876 Hypokalemia: Secondary | ICD-10-CM | POA: Diagnosis present

## 2017-01-09 DIAGNOSIS — Z09 Encounter for follow-up examination after completed treatment for conditions other than malignant neoplasm: Secondary | ICD-10-CM

## 2017-01-09 DIAGNOSIS — Z66 Do not resuscitate: Secondary | ICD-10-CM | POA: Diagnosis present

## 2017-01-09 DIAGNOSIS — R4789 Other speech disturbances: Secondary | ICD-10-CM

## 2017-01-09 DIAGNOSIS — Z7901 Long term (current) use of anticoagulants: Secondary | ICD-10-CM

## 2017-01-09 DIAGNOSIS — I482 Chronic atrial fibrillation: Secondary | ICD-10-CM | POA: Diagnosis present

## 2017-01-09 DIAGNOSIS — R270 Ataxia, unspecified: Secondary | ICD-10-CM

## 2017-01-09 DIAGNOSIS — C450 Mesothelioma of pleura: Secondary | ICD-10-CM | POA: Diagnosis present

## 2017-01-09 DIAGNOSIS — I69393 Ataxia following cerebral infarction: Secondary | ICD-10-CM | POA: Diagnosis present

## 2017-01-09 DIAGNOSIS — E785 Hyperlipidemia, unspecified: Secondary | ICD-10-CM | POA: Diagnosis present

## 2017-01-09 DIAGNOSIS — J910 Malignant pleural effusion: Secondary | ICD-10-CM | POA: Diagnosis present

## 2017-01-09 DIAGNOSIS — N390 Urinary tract infection, site not specified: Secondary | ICD-10-CM | POA: Diagnosis not present

## 2017-01-09 DIAGNOSIS — Z79899 Other long term (current) drug therapy: Secondary | ICD-10-CM | POA: Diagnosis not present

## 2017-01-09 DIAGNOSIS — I6322 Cerebral infarction due to unspecified occlusion or stenosis of basilar arteries: Secondary | ICD-10-CM | POA: Diagnosis present

## 2017-01-09 DIAGNOSIS — D62 Acute posthemorrhagic anemia: Secondary | ICD-10-CM | POA: Diagnosis present

## 2017-01-09 DIAGNOSIS — I63439 Cerebral infarction due to embolism of unspecified posterior cerebral artery: Secondary | ICD-10-CM

## 2017-01-09 DIAGNOSIS — Z888 Allergy status to other drugs, medicaments and biological substances status: Secondary | ICD-10-CM

## 2017-01-09 DIAGNOSIS — I63431 Cerebral infarction due to embolism of right posterior cerebral artery: Secondary | ICD-10-CM | POA: Diagnosis not present

## 2017-01-09 DIAGNOSIS — I10 Essential (primary) hypertension: Secondary | ICD-10-CM | POA: Diagnosis present

## 2017-01-09 DIAGNOSIS — I4892 Unspecified atrial flutter: Secondary | ICD-10-CM | POA: Diagnosis present

## 2017-01-09 DIAGNOSIS — E119 Type 2 diabetes mellitus without complications: Secondary | ICD-10-CM | POA: Diagnosis present

## 2017-01-09 DIAGNOSIS — Z880 Allergy status to penicillin: Secondary | ICD-10-CM

## 2017-01-09 DIAGNOSIS — K219 Gastro-esophageal reflux disease without esophagitis: Secondary | ICD-10-CM | POA: Diagnosis present

## 2017-01-09 DIAGNOSIS — N3000 Acute cystitis without hematuria: Secondary | ICD-10-CM | POA: Diagnosis not present

## 2017-01-09 DIAGNOSIS — I6529 Occlusion and stenosis of unspecified carotid artery: Secondary | ICD-10-CM | POA: Diagnosis present

## 2017-01-09 DIAGNOSIS — C457 Mesothelioma of other sites: Secondary | ICD-10-CM | POA: Diagnosis present

## 2017-01-09 DIAGNOSIS — R066 Hiccough: Secondary | ICD-10-CM | POA: Diagnosis present

## 2017-01-09 MED ORDER — DIPHENHYDRAMINE HCL 12.5 MG/5ML PO ELIX: 12.5000 mg | ORAL_SOLUTION | Freq: Four times a day (QID) | ORAL | Status: DC | PRN

## 2017-01-09 MED ORDER — ACETAMINOPHEN 325 MG PO TABS
325.0000 mg | ORAL_TABLET | ORAL | Status: DC | PRN
  Filled 2017-01-09: qty 2

## 2017-01-09 MED ORDER — GUAIFENESIN-DM 100-10 MG/5ML PO SYRP: 5.0000 mL | ORAL_SOLUTION | Freq: Four times a day (QID) | ORAL | Status: DC | PRN

## 2017-01-09 MED ORDER — BISACODYL 10 MG RE SUPP: 10.0000 mg | Freq: Every day | RECTAL | Status: DC | PRN

## 2017-01-09 MED ORDER — ASPIRIN EC 325 MG PO TBEC
325.0000 mg | DELAYED_RELEASE_TABLET | Freq: Every day | ORAL | Status: DC
  Administered 2017-01-10 – 2017-01-18 (×9): 325 mg via ORAL
  Filled 2017-01-09 (×9): qty 1

## 2017-01-09 MED ORDER — PROCHLORPERAZINE 25 MG RE SUPP: 12.5000 mg | Freq: Four times a day (QID) | RECTAL | Status: DC | PRN

## 2017-01-09 MED ORDER — ALUM & MAG HYDROXIDE-SIMETH 200-200-20 MG/5ML PO SUSP: 30.0000 mL | ORAL | Status: DC | PRN

## 2017-01-09 MED ORDER — PROCHLORPERAZINE MALEATE 5 MG PO TABS
5.0000 mg | ORAL_TABLET | Freq: Four times a day (QID) | ORAL | Status: DC | PRN
  Administered 2017-01-11: 5 mg via ORAL
  Administered 2017-01-17: 10 mg via ORAL
  Filled 2017-01-09: qty 1
  Filled 2017-01-09: qty 2

## 2017-01-09 MED ORDER — HEPARIN SODIUM (PORCINE) 5000 UNIT/ML IJ SOLN
5000.0000 [IU] | Freq: Three times a day (TID) | INTRAMUSCULAR | Status: DC
  Administered 2017-01-09 – 2017-01-18 (×26): 5000 [IU] via SUBCUTANEOUS
  Filled 2017-01-09 (×26): qty 1

## 2017-01-09 MED ORDER — FLEET ENEMA 7-19 GM/118ML RE ENEM: 1.0000 | ENEMA | Freq: Once | RECTAL | Status: DC | PRN

## 2017-01-09 MED ORDER — TRAZODONE HCL 50 MG PO TABS
25.0000 mg | ORAL_TABLET | Freq: Every evening | ORAL | Status: DC | PRN
  Administered 2017-01-10 – 2017-01-17 (×4): 50 mg via ORAL
  Filled 2017-01-09 (×4): qty 1

## 2017-01-09 MED ORDER — POLYETHYLENE GLYCOL 3350 17 G PO PACK: 17.0000 g | PACK | Freq: Every day | ORAL | Status: DC | PRN

## 2017-01-09 MED ORDER — CLONIDINE HCL 0.1 MG PO TABS
0.1000 mg | ORAL_TABLET | ORAL | Status: DC | PRN
  Administered 2017-01-09 – 2017-01-10 (×2): 0.1 mg via ORAL
  Filled 2017-01-09 (×2): qty 1

## 2017-01-09 MED ORDER — PROCHLORPERAZINE EDISYLATE 5 MG/ML IJ SOLN
5.0000 mg | Freq: Four times a day (QID) | INTRAMUSCULAR | Status: DC | PRN
  Administered 2017-01-12: 10 mg via INTRAMUSCULAR
  Filled 2017-01-09: qty 2

## 2017-01-09 MED ORDER — AMLODIPINE BESYLATE 5 MG PO TABS
5.0000 mg | ORAL_TABLET | Freq: Every day | ORAL | Status: DC
  Administered 2017-01-10: 5 mg via ORAL
  Filled 2017-01-09: qty 1

## 2017-01-09 MED ORDER — ROSUVASTATIN CALCIUM 10 MG PO TABS
10.0000 mg | ORAL_TABLET | Freq: Every day | ORAL | Status: DC
  Administered 2017-01-10 – 2017-01-17 (×8): 10 mg via ORAL
  Filled 2017-01-09 (×8): qty 1

## 2017-01-09 NOTE — Progress Notes (Signed)
Physical Therapy Treatment Patient Details Name: Thomas [name] MRN: [id number] DOB: [birth year] Today's Date: 01/09/2017    History of Present Illness Pt is an 83 y.o. male with recent B occipital stroke who presented to the ED with 2 days of worsening malaise/dizziness with acute worsening of facial weakness/dysarthria. MRI revealing R cerebellar and pontine infarcts (including R PICA) with petechial hemorrhage and stable old B occipital, L frontal, and L parietal infarcts. PMH significant for recent diagnosis of malignant mesothelioma, adenomatous colon polyp, esophageal stricture, esophagitis, GERD, prostate cancer, hypertension, hiatal hernia, and unspecified gastritis and gastroduodenitis without mention of hemorrhage.     PT Comments    Pt is progressing towards physical therapy goals. Required increased cues and more frequent assist during gait training this session, however overall mobilizing well. Pt reports several times that he does not feel well today and states "today is just not a good day". Expresses frustration with his impairments since the stroke. Will continue to follow and progress as able per POC. Feel that CIR continues to be an appropriate d/c disposition.   Follow Up Recommendations  CIR;Supervision/Assistance - 24 hour     Equipment Recommendations  None recommended by PT    Recommendations for Other Services Rehab consult     Precautions / Restrictions Precautions Precautions: Fall Restrictions Weight Bearing Restrictions: No    Mobility  Bed Mobility Overal bed mobility: Needs Assistance Bed Mobility: Supine to Sit     Supine to sit: Min guard     General bed mobility comments: Increased time with close guard at the trunk for safety. Poor sequencing, but able to correct with time.   Transfers Overall transfer level: Needs assistance Equipment used: Rolling walker (2 wheeled) Transfers: Sit to/from Stand Sit to Stand: Min assist          General transfer comment: VC's for hand placement on seated surface for safety. Pt required assist for balance support and to recover from large LOB upon first stand.   Ambulation/Gait Ambulation/Gait assistance: Min assist;+2 safety/equipment Ambulation Distance (Feet): 120 Feet Assistive device: Rolling walker (2 wheeled) Gait Pattern/deviations: Ataxic;Decreased step length - right;Decreased step length - left;Step-through pattern;Drifts right/left;Trunk flexed Gait velocity: Decreased Gait velocity interpretation: Below normal speed for age/gender General Gait Details: Pt with poor posture today, and noted R shoulder elevation. Was able to stop and correct but not able to maintain. Pt with several lateral losses of balance to the R side requiring assist to recover.    Stairs            Wheelchair Mobility    Modified Rankin (Stroke Patients Only) Modified Rankin (Stroke Patients Only) Pre-Morbid Rankin Score: No symptoms Modified Rankin: Moderately severe disability     Balance Overall balance assessment: Needs assistance Sitting-balance support: No upper extremity supported;Feet supported Sitting balance-Leahy Scale: Fair Sitting balance - Comments: pt able to sit EOB with supervision Postural control: Right lateral lean;Other (comment) (forward lean) Standing balance support: No upper extremity supported Standing balance-Leahy Scale: Fair Standing balance comment: Statically pt was able to stand without UE support. When dynamic challenges were introduced, pt required mod assist for balance support.                             Cognition Arousal/Alertness: Awake/alert Behavior During Therapy: WFL for tasks assessed/performed Overall Cognitive Status: Impaired/Different from baseline Area of Impairment: Orientation;Attention;Memory;Following commands;Safety/judgement;Awareness;Problem solving  Orientation Level: Disoriented  to;Time;Situation Current Attention Level: Selective Memory: Decreased short-term memory;Decreased recall of precautions Following Commands: Follows one step commands consistently;Follows one step commands with increased time Safety/Judgement: Decreased awareness of safety;Decreased awareness of deficits Awareness: Emergent Problem Solving: Slow processing;Difficulty sequencing;Requires verbal cues General Comments: Pt with decreased sequencing abilities during functional mobility making maneuvering RW difficult.      Exercises      General Comments        Pertinent Vitals/Pain Pain Assessment: No/denies pain    Home Living   Living Arrangements: Spouse/significant other;Children (2 year old son lives with parents) Available Help at Discharge: Family;Available 24 hours/day                Prior Function Level of Independence: Independent      Comments: was getting around with RW without A until recent CVA   PT Goals (current goals can now be found in the care plan section) Acute Rehab PT Goals Patient Stated Goal: to go home PT Goal Formulation: With patient/family Time For Goal Achievement: 01/21/17 Potential to Achieve Goals: Fair Progress towards PT goals: Progressing toward goals    Frequency    Min 4X/week      PT Plan Current plan remains appropriate    Co-evaluation              AM-PAC PT "6 Clicks" Daily Activity  Outcome Measure  Difficulty turning over in bed (including adjusting bedclothes, sheets and blankets)?: None Difficulty moving from lying on back to sitting on the side of the bed? : A Little Difficulty sitting down on and standing up from a chair with arms (e.g., wheelchair, bedside commode, etc,.)?: Total Help needed moving to and from a bed to chair (including a wheelchair)?: Total Help needed walking in hospital room?: Total Help needed climbing 3-5 steps with a railing? : Total 6 Click Score: 11    End of Session  Equipment Utilized During Treatment: Gait belt Activity Tolerance: Patient limited by fatigue Patient left: in chair;with call bell/phone within reach;with family/visitor present Nurse Communication: Mobility status PT Visit Diagnosis: Ataxic gait (R26.0);Other symptoms and signs involving the nervous system (R29.898)     Time: 1111-1130 PT Time Calculation (min) (ACUTE ONLY): 19 min  Charges:  $Gait Training: 8-22 mins                    G Codes:       Rolinda Roan, PT, DPT Acute Rehabilitation Services Pager: [place] 01/09/2017, 1:45 PM

## 2017-01-09 NOTE — Progress Notes (Signed)
I met with pt and a son at bedside to discuss a possible inpt rehab admission. I have begun insurance approval for possible admission today . Insurance was closed yesterday for the [employer]. Pt and son are I agreement to this plan. I will follow up today. [phone]

## 2017-01-09 NOTE — H&P (Signed)
Physical Medicine and Rehabilitation Admission H&P       Chief Complaint  Patient presents with  . Stroke with ataxia, difficulty walking, hiccups and dysphagia.     HPI: Thomas Johnsonis a 83 y.o.malewith history of HTN, esophageal stricture, PAF, recurrent left pleural effusion, malignant mesothelioma (less than 6 moths life expectancy), 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 History taken from chart review and family. CTA head/neck showed acute to subacute right cerebellar infarct with interval occlusion of right V3 and V4 segments with intermittent tenuous blood flow in basilar and bilateral PCA and severe atherosclerosis of bilateral cervical carotids with left mid ICA intimal flap causing 50% stenosis. MRI brain reviewed, showing acute right cerebellar and pontine infarcts with petechial hemorrhage and stable old bilateral occipital and left frontal/parietal infarcts.  Eliquis changed to ASA due to size of stroke and concerns of hemorrhagic conversion  with recommendations to resume in two weeks.  MBS done revealing delayed swallow with sensed aspiration of thins and diet has been advanced to dysphagia 3, thin liquids. ~Patient with resultant balance deficits with ataxia and CIR recommended for follow up therapy.    Review of Systems  HENT: Negative for hearing loss and tinnitus.   Eyes: Negative for blurred vision and double vision.  Respiratory: Positive for sputum production. Negative for cough, shortness of breath and wheezing.   Cardiovascular: Negative for chest pain and palpitations.  Gastrointestinal: Negative for constipation, heartburn and nausea.       Constant hiccups--maybe a little better today.  Genitourinary: Negative for dysuria and urgency.  Musculoskeletal: Negative for myalgias.  Skin: Negative for itching and rash.  Neurological: Positive for dizziness, speech change and weakness.  Negative for headaches.  Psychiatric/Behavioral: Negative for depression. The patient is not nervous/anxious.           Past Medical History:  Diagnosis Date  . Adenomatous colon polyp 2009  . Allergy   . Carotid artery occlusion   . Diverticulosis of colon (without mention of hemorrhage)   . ED (erectile dysfunction)   . Esophageal stricture   . Esophagitis   . GERD (gastroesophageal reflux disease)   . Goals of care, counseling/discussion 12/23/2016  . Hiatal hernia   . Hypertension   . Prostate cancer ([place])   . Unspecified gastritis and gastroduodenitis without mention of hemorrhage          Past Surgical History:  Procedure Laterality Date  . ENDARTERECTOMY Left 03/05/2013   Procedure: ENDARTERECTOMY CAROTID;  Surgeon: Conrad Walkers[place], MD;  Location: [place];  Service: Vascular;  Laterality: Left;  . ESOPHAGOGASTRODUODENOSCOPY (EGD) WITH ESOPHAGEAL DILATION    . PROSTATE SURGERY     Laser surgery         Family History  Problem Relation Age of Onset  . Heart disease Father        unclear specifics  . Cancer Mother   . Breast cancer Sister   . Hyperlipidemia Daughter   . Hypertension Daughter   . Hypertension Son     Social History:  Married. Was in the [employer]--retired from Architect (plaster/painting). He was independent and driving prior to last stroke. Has been using a walker since last stroke. He reports that he quit smoking about 63 years ago. His smoking use included Cigarettes. He has a 2.00 pack-year smoking history. He has never used smokeless tobacco. He reports that he drinks beer or wine on occasion. He reports that he does  not use drugs        Allergies  Allergen Reactions  . Ace Inhibitors Hives  . Penicillins Swelling    Swelling  of the face. Has patient had a PCN reaction causing immediate rash, facial/tongue/throat swelling, SOB or lightheadedness with hypotension: YES Has patient had a PCN reaction causing  severe rash involving mucus membranes or skin necrosis: NO Has patient had a PCN reaction that required hospitalizationNO Has patient had a PCN reaction occurring within the last 10 years: NO If all of the above answers are "NO", then may proceed with Cephalosporin use.          Medications Prior to Admission  Medication Sig Dispense Refill  . amLODipine (NORVASC) 5 MG tablet Take 1 tablet (5 mg total) by mouth daily. 30 tablet 3  . apixaban (ELIQUIS) 5 MG TABS tablet Take 5 mg by mouth 2 (two) times daily.    . cloNIDine (CATAPRES) 0.1 MG tablet TAKE ONE TABLET BY MOUTH THREE TIMES DAILY 90 tablet 4  . metoCLOPramide (REGLAN) 5 MG tablet Take 1 tablet (5 mg total) by mouth every 8 (eight) hours as needed for nausea. 15 tablet 0  . rosuvastatin (CRESTOR) 10 MG tablet Take 1 tablet (10 mg total) by mouth daily. 92 tablet 3  . meclizine (ANTIVERT) 25 MG tablet Take 1 tablet (25 mg total) by mouth 3 (three) times daily as needed for dizziness. (Patient not taking: Reported on 01/05/2017) 30 tablet 0    Home: [place] expects to be discharged to:: Private residence Living Arrangements: Spouse/significant other, Children Available Help at Discharge: Family, Available PRN/intermittently Type of Home: House Home Access: Stairs to enter [employer] of Steps: 2 Entrance Stairs-Rails: Right Home Layout: One level Bathroom Shower/Tub: Tub/shower unit, Door [employer] Toilet: Standard Bathroom Accessibility: Yes Home Equipment: Environmental consultant - 2 wheels, Cane - single point, Bedside commode, Shower seat  Lives With: Spouse, Son   Functional History: Prior Function Level of Independence: Independent with assistive device(s) Comments: was getting around with RW without A until recent CVA  Functional Status:  Mobility: Bed Mobility Overal bed mobility: Needs Assistance Bed Mobility: Supine to Sit Supine to sit: Min guard General bed mobility comments: Increased  time with close guard at the trunk for safety. Poor sequencing, but able to correct with time.  Transfers Overall transfer level: Needs assistance Equipment used: Rolling walker (2 wheeled) Transfers: Sit to/from Stand Sit to Stand: Min assist General transfer comment: VC's for hand placement on seated surface for safety. Pt required assist for balance support and to recover from large LOB upon first stand.  Ambulation/Gait Ambulation/Gait assistance: Mod assist, +2 safety/equipment Ambulation Distance (Feet): 100 Feet Assistive device: Rolling walker (2 wheeled) Gait Pattern/deviations: Ataxic, Decreased step length - right, Decreased step length - left, Step-through pattern, Drifts right/left, Trunk flexed General Gait Details: With RW, pt was able to ambulate with improved gait and balance and mod assist from therapist for LOB to the right. Occasional repositioning of walker was required as well to maintain safe distance. Gait velocity: Decreased Gait velocity interpretation: Below normal speed for age/gender  ADL: ADL Overall ADL's : Needs assistance/impaired Eating/Feeding: Set up, Sitting Grooming: Minimal assistance, Standing Upper Body Bathing: Supervision/ safety, Set up, Sitting Lower Body Bathing: Minimal assistance, Sit to/from stand Upper Body Dressing : Supervision/safety, Set up, Sitting Lower Body Dressing: Sit to/from stand, Minimal assistance Toilet Transfer: Moderate assistance, +2 for safety/equipment, Ambulation, RW, BSC Toileting- Clothing Manipulation and Hygiene: Moderate assistance, Sit to/from  stand Functional mobility during ADLs: Moderate assistance, +2 for safety/equipment, Rolling walker General ADL Comments: Pt with significant ataxia during mobility limiting safety. R and forward lean requiring physical assistance to correct.   Cognition: Cognition Overall Cognitive Status: Impaired/Different from baseline Arousal/Alertness: Awake/alert Orientation  Level: Oriented to person, Oriented to place, Oriented to situation, Disoriented to time Attention: Sustained Sustained Attention: Impaired Sustained Attention Impairment: Verbal basic, Functional basic Memory: Impaired Memory Impairment: Storage deficit, Retrieval deficit Awareness: Appears intact Problem Solving: Impaired Problem Solving Impairment: Verbal basic Cognition Arousal/Alertness: Awake/alert Behavior During Therapy: WFL for tasks assessed/performed Overall Cognitive Status: Impaired/Different from baseline Area of Impairment: Orientation, Attention, Memory, Following commands, Safety/judgement, Awareness, Problem solving Orientation Level: Disoriented to, Time, Situation Current Attention Level: Selective Memory: Decreased short-term memory, Decreased recall of precautions Following Commands: Follows one step commands consistently, Follows one step commands with increased time Safety/Judgement: Decreased awareness of safety, Decreased awareness of deficits Awareness: Emergent Problem Solving: Slow processing, Difficulty sequencing, Requires verbal cues General Comments: Pt with decreased sequencing abilities during functional mobility making maneuvering RW difficult.  Physical Exam: Blood pressure (!) 173/105, pulse 77, temperature 97.9 F (36.6 C), temperature source Oral, resp. rate 18, height 5\' 11"  (1.803 m), weight 72.6 kg (160 lb 0.9 oz), SpO2 100 %. Physical Exam  Nursing note and vitals reviewed. Constitutional: He is oriented to person, place, and time. He appears well-developed and well-nourished.  HENT:  Head: Normocephalic and atraumatic.  Mouth/Throat: Oropharynx is clear and moist.  Eyes: Conjunctivae and EOM are normal. Pupils are equal, round, and reactive to light.  Neck: Normal range of motion. Neck supple.  Cardiovascular: Normal rate and regular rhythm.   Respiratory: Effort normal. No stridor. No respiratory distress. He has decreased breath  sounds. He has no wheezes.  Occasional cough  GI: Soft. Bowel sounds are normal. He exhibits no distension. There is no tenderness.  Musculoskeletal: He exhibits no edema or tenderness.  Neurological: He is alert and oriented to person, place, and time.  Mild dysarthria with deep voice. Intelligible. Fair insight and awareness.   Able to follow simple commands without difficulty. Dysmetria with right finger to nose more than left. .  Sensory deficits RUE and RLE.  Motor: 4+/5 throughout bilateral UE and LE's   Skin: Skin is warm and dry. No rash noted. No erythema.  Psychiatric: He has a normal mood and affect. His behavior is normal. Thought content normal.    Lab Results Last 48 Hours  No results found for this or any previous visit (from the past 48 hour(s)).   Imaging Results (Last 48 hours)  No results found.       Medical Problem List and Plan: 1.  Balance, swallowing, communication and functional deficits secondary to right cerebellar and pontine infarcts             -admit to inpatient rehab 2.  DVT Prophylaxis/Anticoagulation: Pharmaceutical: Heparin 3. Pain Management: N/A 4. Mood: Has a positive outlook and supportive family. LCSW to follow for evaluation and support.  5. Neuropsych: This patient is capable of making decisions on his own behalf. 6. Skin/Wound Care: routine pressure relief measures.  7. Fluids/Electrolytes/Nutrition: Monitor I/O. Check lytes in am.  8. HTN: Monitor BP bid with permissive HTN.  Continue Norvasc daily 9.  Malignant mesothelioma with recurrent pleural effusion: Lifespan of less than 6 months.  10.  A fib: Off Eliquis--to be resumed  11. Hypokalemia: resolved with supplement.  12. T2DM with Hgb A1C-6.9: Fasting/random BS have ranged  from 110 -130. Will have RD educate on  appropriate food choices but will not limit diet due to QOL issues.  13. Dyslipidemia: On Crestor.     Post Admission Physician Evaluation: 1. Functional  deficits secondary  to right cerebellar and pontine infarcts. 2. Patient is admitted to receive collaborative, interdisciplinary care between the physiatrist, rehab nursing staff, and therapy team. 3. Patient's level of medical complexity and substantial therapy needs in context of that medical necessity cannot be provided at a lesser intensity of care such as a SNF. 4. Patient has experienced substantial functional loss from his/her baseline which was documented above under the "Functional History" and "Functional Status" headings.  Judging by the patient's diagnosis, physical exam, and functional history, the patient has potential for functional progress which will result in measurable gains while on inpatient rehab.  These gains will be of substantial and practical use upon discharge  in facilitating mobility and self-care at the household level. 5. Physiatrist will provide 24 hour management of medical needs as well as oversight of the therapy plan/treatment and provide guidance as appropriate regarding the interaction of the two. 6. The Preadmission Screening has been reviewed and patient status is unchanged unless otherwise stated above. 7. 24 hour rehab nursing will assist with bladder management, bowel management, safety, skin/wound care, disease management, medication administration, pain management and patient education  and help integrate therapy concepts, techniques,education, etc. 8. PT will assess and treat for/with: Lower extremity strength, range of motion, stamina, balance, functional mobility, safety, adaptive techniques and equipment, vestibular mgt, family education.   Goals are: mod I to supervision. 9. OT will assess and treat for/with: ADL's, functional mobility, safety, upper extremity strength, adaptive techniques and equipment, NMR, vestibular mgt, family ed.   Goals are: supervision to mod I. Therapy may proceed with showering this patient. 10. SLP will assess and treat for/with:  swallowing, communication, cognition.  Goals are: supervision to mod I. 11. Case Management and Social Worker will assess and treat for psychological issues and discharge planning. 12. Team conference will be held weekly to assess progress toward goals and to determine barriers to discharge. 13. Patient will receive at least 3 hours of therapy per day at least 5 days per week. 14. ELOS: 13-17 days       15. Prognosis:  excellent     Meredith Staggers, MD, [facility] 01/09/2017  Bary Leriche, Hershal Coria 01/09/2017

## 2017-01-09 NOTE — Progress Notes (Signed)
STROKE TEAM PROGRESS NOTE   HISTORY OF PRESENT ILLNESS (per record) Thomas [name] is a 83 y.o. male with recent occipital stroke who presents with 2 days of worsening general malaise/dizziness with acute worsening of facial weakness/dysarthria today around 11 AM. Of note, he is currently being referred to hospice with expectation of expectancy less than 6 months due to malignant mesothelioma.  The family has a meeting with palliative care set up for tomorrow, but has not yet had many discussions regarding limitations of care.  CT head was performed which demonstrates a large PICA infarct, CTA was performed which demonstrates distal vertebral occlusion with retrograde basilar filling. At this time, I suspect that he is at very high risk of worsening, but I think that endovascular intervention in this setting could carry significant risk as well and especially in someone with his current medical problems would be fairly aggressive.  I discussed with family various options indicating that I was not certain which would be superior, but that I favored observation with the understanding that there was potential for significant worsening. I discussed with him that if he does have significant worsening, he would likely be a debilitating stroke and in the setting would he want intubation, and both the patient and the family agreed that he would not want intubation in the setting.  LKW: 2 days ago tpa given?: no, anticoagulation/outside of window Modified Rankin Score: 2   SUBJECTIVE (INTERVAL HISTORY) He is ambulating in the hallway with the help of physical therapist   OBJECTIVE Temp:  [97.9 F (36.6 C)-98.4 F (36.9 C)] 97.9 F (36.6 C) (05/29 0955) Pulse Rate:  [69-77] 77 (05/29 0955) Cardiac Rhythm: Atrial flutter (05/29 0700) Resp:  [18-20] 18 (05/29 0955) BP: (169-201)/(84-120) 173/105 (05/29 0955) SpO2:  [96 %-100 %] 100 % (05/29 0955)  CBC:   Recent Labs Lab 01/05/17 1214  01/05/17 1226  WBC  --  5.7  NEUTROABS  --  3.8  HGB 13.3 12.8*  HCT 39.0 39.0  MCV  --  86.3  PLT  --  371    Basic Metabolic Panel:   Recent Labs Lab 01/05/17 1226 01/07/17 0343  NA 136 137  K 3.1* 3.8  CL 103 107  CO2 20* 22  GLUCOSE 126* 130*  BUN 17 18  CREATININE 1.28* 1.10  CALCIUM 9.1 8.7*    Lipid Panel:     Component Value Date/Time   CHOL 131 01/06/2017 0213   TRIG 91 01/06/2017 0213   HDL 28 (L) 01/06/2017 0213   CHOLHDL 4.7 01/06/2017 0213   VLDL 18 01/06/2017 0213   LDLCALC 85 01/06/2017 0213   HgbA1c:  Lab Results  Component Value Date   HGBA1C 6.9 (H) 01/06/2017   Urine Drug Screen:     Component Value Date/Time   LABOPIA NONE DETECTED 01/05/2017 1920   COCAINSCRNUR NONE DETECTED 01/05/2017 1920   LABBENZ NONE DETECTED 01/05/2017 1920   AMPHETMU NONE DETECTED 01/05/2017 1920   THCU NONE DETECTED 01/05/2017 1920   LABBARB NONE DETECTED 01/05/2017 1920    Alcohol Level     Component Value Date/Time   ETH <5 01/05/2017 1226    IMAGING  Ct Angio Head W Or Wo Contrast Ct Angio Neck W Or Wo Contrast 01/05/2017 1. Interval occlusion of the right V3 and V4 segments with intermittent tenuous blood flow in the basilar and bilateral posterior cerebral arteries. There is underlying severe right V4 stenoses by prior MRA. (left prevertebral artery ends in PICA and there is  no significant posterior communicating arteries).  2. Advanced intracranial atherosclerosis with high-grade right cavernous and distal M1 segment stenoses.  3. Severe atherosclerosis of the bilateral cervical carotids with left mid ICA intimal flap causing ~50% narrowing.  4. Acute to subacute right cerebellar infarct.  5. Complex left pleural effusion with history of mesothelioma.    Ct Head Code Stroke Wo Contrast 01/05/2017 1. Acute to subacute infarct in the inferior right cerebellum.  2. Small to moderate remote infarcts in the bilateral occipital lobes.  3. Diffuse  chronic small vessel ischemia in the cerebral white matter.    MRI Brain Wo Contrast 01/06/2017 Acute RIGHT cerebellar and pontine infarcts (including RIGHT PICA), with petechial hemorrhage. Otherwise stable appearance including old bilateral occipital lobe, LEFT frontal and LEFT parietal lobe infarcts, moderate to severe chronic small vessel ischemic disease.    PHYSICAL EXAM  Physical exam: Exam: Gen: Pleasant elderly African-American male not in distress                  CV: RRR, no MRG. No Carotid Bruits. No peripheral edema, warm, nontender Eyes: Conjunctivae clear without exudates or hemorrhage  Neuro: Detailed Neurologic Exam  Speech:    Speech is minimally dysarthric, and can speak sentences clearly Cognition:    The patient is oriented to person, place, month, not year,     recent and remote memory impaired;    Cranial Nerves:    The pupils are equal, round, and reactive to light. Visual fields are full to threat. Right esotropia but xtraocular movements are intact. Trigeminal sensation is intact and the muscles of mastication are normal. Right facial droop. The palate elevates in the midline. Hearing intact. Voice is normal. Shoulder shrug is normal. The tongue has normal motion without fasciculations.   Coordination: right-sided dysmetria which is improved today significantly  Motor Observation:    no involuntary movements noted. Tone:    Normal muscle tone.    Strength:    Strength is intact and symmetric in the upper and lower limbs.      Sensation: intact to LT Gait deferred      ASSESSMENT/PLAN [name] is a 83 y.o. male with history of prostate cancer, hypertension, paroxysmal atrial fibrillation on Eliquis prior to admission, carotid artery occlusion, and malignant mesothelioma with DO NOT RESUSCITATE order presenting with facial weakness, dysarthria, malaise, and dizziness. He did not receive IV t-PA due to anticoagulation.  Stroke:  PICA  infarct - embolic secondary to atrial fibrillation.  Resultant  Dysmetria, dysarthria, hemiplegia which has considerably improved  CT head - Acute to subacute infarct in the inferior right cerebellum.   MRI head - Acute RIGHT cerebellar and pontine infarcts, with petechial hemorrhage.  MRA head not performed  CTA H&N - Interval occlusion of the right V3 and V4 segments with intermittent tenuous blood flow in the basilar and bilateral posterior cerebral arteries  Carotid Doppler - CTA neck  2D Echo - EF 50-55%. No cardiac source of emboli identified.  LDL - 85  HgbA1c - 6.9  VTE prophylaxis - subcutaneous heparin and SCDs DIET DYS 3 Room service appropriate? Yes; Fluid consistency: Thin  Eliquis (apixaban) daily prior to admission, now on aspirin 325 mg daily. Given the size of the posterior stroke, Eliquis discontinued at this time and may be restarted again in 2 weeks. Also may consider asa 81mg  in addition to the Eliquis.  Patient counseled to be compliant with his antithrombotic medications  Ongoing aggressive stroke risk factor management  Therapy recommendations: pending  Disposition: Pending  Hypertension  Stable - but on the high side.  Permissive hypertension (OK if < 220/120) but gradually normalize in 5-7 days  Long-term BP goal normotensive  Hyperlipidemia  Home meds:  Crestor 10 mg daily resumed in hospital  LDL 85, goal < 70  Continue statin at discharge   Other Stroke Risk Factors  Advanced age  Former cigarette smoker quit 63 years ago.  ETOH use, advised to drink no more than 1 drink per day.  Hx stroke/TIA - remote infarcts on head CT.  Atrial fibrillation  Other Active Problems  Palliative care following  Hypokalemia - 3.1 -> 3.8  Mildly elevated creatinine 1.28 -> 1.10  Hospital day # 4    I  Agree with inpatient rehabilitation   Medically stable for transfer to rehabilitation when bed available   Antony Contras, MD Stroke  Neurology  To contact Stroke Continuity provider, please refer to [email or web address] After hours, contact General Neurology

## 2017-01-09 NOTE — Progress Notes (Signed)
I have insurance approval and contacted Dr. Leonie Man. Pt will be admitted to inpt rehab today. Family at bedside and in agreement. [phone]

## 2017-01-09 NOTE — Progress Notes (Signed)
Late entry. Patient received at approximately 1824 alert and oriented x 2-3. Family at bedside. Oriented patient and family to room and call bell system. Patient and family verbalized understanding of admission process. B/P elevated 185/105. Patient B/P appeared to be baseline. Continue with plan of care.  Mliss Sax

## 2017-01-09 NOTE — H&P (Signed)
Physical Medicine and Rehabilitation Admission H&P    Chief Complaint  Patient presents with  . Stroke with ataxia, difficulty walking, hiccups and dysphagia.     HPI: Thomas [name] is a 83 y.o. 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  History taken from chart review and family. CTA head/neck showed acute to subacute right cerebellar infarct with interval occlusion of right V3 and V4 segments with intermittent tenuous blood flow in basilar and bilateral PCA and severe atherosclerosis of bilateral cervical carotids with left mid ICA intimal flap causing 50% stenosis. MRI brain reviewed, showing acute right cerebellar and pontine infarcts with petechial hemorrhage and stable old bilateral occipital and left frontal/parietal infarcts.   Eliquis changed to ASA due to size of stroke and concerns of hemorrhagic conversion  with recommendations to resume in two weeks.  MBS done revealing delayed swallow with sensed aspiration of thins and diet has been advanced to dysphagia 3, thin liquids. ~ Patient with resultant balance deficits with ataxia and CIR recommended for follow up therapy.    Review of Systems  HENT: Negative for hearing loss and tinnitus.   Eyes: Negative for blurred vision and double vision.  Respiratory: Positive for sputum production. Negative for cough, shortness of breath and wheezing.   Cardiovascular: Negative for chest pain and palpitations.  Gastrointestinal: Negative for constipation, heartburn and nausea.       Constant hiccups--maybe a little better today.  Genitourinary: Negative for dysuria and urgency.  Musculoskeletal: Negative for myalgias.  Skin: Negative for itching and rash.  Neurological: Positive for dizziness, speech change and weakness. Negative for  headaches.  Psychiatric/Behavioral: Negative for depression. The patient is not nervous/anxious.       Past Medical History:  Diagnosis Date  . Adenomatous colon polyp 2009  . Allergy   . Carotid artery occlusion   . Diverticulosis of colon (without mention of hemorrhage)   . ED (erectile dysfunction)   . Esophageal stricture   . Esophagitis   . GERD (gastroesophageal reflux disease)   . Goals of care, counseling/discussion 12/23/2016  . Hiatal hernia   . Hypertension   . Prostate cancer ([place])   . Unspecified gastritis and gastroduodenitis without mention of hemorrhage     Past Surgical History:  Procedure Laterality Date  . ENDARTERECTOMY Left 03/05/2013   Procedure: ENDARTERECTOMY CAROTID;  Surgeon: Conrad Stamford, MD;  Location: [facility];  Service: Vascular;  Laterality: Left;  . ESOPHAGOGASTRODUODENOSCOPY (EGD) WITH ESOPHAGEAL DILATION    . PROSTATE SURGERY     Laser surgery    Family History  Problem Relation Age of Onset  . Heart disease Father        unclear specifics  . Cancer Mother   . Breast cancer Sister   . Hyperlipidemia Daughter   . Hypertension Daughter   . Hypertension Son     Social History:   Married. Was in the [employer]--retired from Architect (plaster/painting). He was independent and driving prior to last stroke. Has been using a walker since last stroke. He reports that he quit smoking about 63 years ago. His smoking use included Cigarettes. He has a 2.00 pack-year smoking history. He has never used smokeless tobacco. He reports that he drinks beer or wine on occasion. He reports that he does not use drugs   Allergies  Allergen Reactions  .  Ace Inhibitors Hives  . Penicillins Swelling    Swelling  of the face. Has patient had a PCN reaction causing immediate rash, facial/tongue/throat swelling, SOB or lightheadedness with hypotension: YES Has patient had a PCN reaction causing severe rash involving mucus membranes or skin necrosis: NO Has patient had  a PCN reaction that required hospitalizationNO Has patient had a PCN reaction occurring within the last 10 years: NO If all of the above answers are "NO", then may proceed with Cephalosporin use.    Medications Prior to Admission  Medication Sig Dispense Refill  . amLODipine (NORVASC) 5 MG tablet Take 1 tablet (5 mg total) by mouth daily. 30 tablet 3  . apixaban (ELIQUIS) 5 MG TABS tablet Take 5 mg by mouth 2 (two) times daily.    . cloNIDine (CATAPRES) 0.1 MG tablet TAKE ONE TABLET BY MOUTH THREE TIMES DAILY 90 tablet 4  . metoCLOPramide (REGLAN) 5 MG tablet Take 1 tablet (5 mg total) by mouth every 8 (eight) hours as needed for nausea. 15 tablet 0  . rosuvastatin (CRESTOR) 10 MG tablet Take 1 tablet (10 mg total) by mouth daily. 92 tablet 3  . meclizine (ANTIVERT) 25 MG tablet Take 1 tablet (25 mg total) by mouth 3 (three) times daily as needed for dizziness. (Patient not taking: Reported on 01/05/2017) 30 tablet 0    Home: [place] expects to be discharged to:: Private residence Living Arrangements: Spouse/significant other, Children Available Help at Discharge: Family, Available PRN/intermittently Type of Home: House Home Access: Stairs to enter [employer] of Steps: 2 Entrance Stairs-Rails: Right Home Layout: One level Bathroom Shower/Tub: Tub/shower unit, Door [employer] Toilet: Standard Bathroom Accessibility: Yes Home Equipment: Environmental consultant - 2 wheels, Cane - single point, Bedside commode, Shower seat  Lives With: Spouse, Son   Functional History: Prior Function Level of Independence: Independent with assistive device(s) Comments: was getting around with RW without A until recent CVA  Functional Status:  Mobility: Bed Mobility Overal bed mobility: Needs Assistance Bed Mobility: Supine to Sit Supine to sit: Min guard General bed mobility comments: Increased time with close guard at the trunk for safety. Poor sequencing, but able to correct with  time.  Transfers Overall transfer level: Needs assistance Equipment used: Rolling walker (2 wheeled) Transfers: Sit to/from Stand Sit to Stand: Min assist General transfer comment: VC's for hand placement on seated surface for safety. Pt required assist for balance support and to recover from large LOB upon first stand.  Ambulation/Gait Ambulation/Gait assistance: Mod assist, +2 safety/equipment Ambulation Distance (Feet): 100 Feet Assistive device: Rolling walker (2 wheeled) Gait Pattern/deviations: Ataxic, Decreased step length - right, Decreased step length - left, Step-through pattern, Drifts right/left, Trunk flexed General Gait Details: With RW, pt was able to ambulate with improved gait and balance and mod assist from therapist for LOB to the right. Occasional repositioning of walker was required as well to maintain safe distance. Gait velocity: Decreased Gait velocity interpretation: Below normal speed for age/gender    ADL: ADL Overall ADL's : Needs assistance/impaired Eating/Feeding: Set up, Sitting Grooming: Minimal assistance, Standing Upper Body Bathing: Supervision/ safety, Set up, Sitting Lower Body Bathing: Minimal assistance, Sit to/from stand Upper Body Dressing : Supervision/safety, Set up, Sitting Lower Body Dressing: Sit to/from stand, Minimal assistance Toilet Transfer: Moderate assistance, +2 for safety/equipment, Ambulation, RW, BSC Toileting- Clothing Manipulation and Hygiene: Moderate assistance, Sit to/from stand Functional mobility during ADLs: Moderate assistance, +2 for safety/equipment, Rolling walker General ADL Comments: Pt with significant ataxia during  mobility limiting safety. R and forward lean requiring physical assistance to correct.   Cognition: Cognition Overall Cognitive Status: Impaired/Different from baseline Arousal/Alertness: Awake/alert Orientation Level: Oriented to person, Oriented to place, Oriented to situation, Disoriented to  time Attention: Sustained Sustained Attention: Impaired Sustained Attention Impairment: Verbal basic, Functional basic Memory: Impaired Memory Impairment: Storage deficit, Retrieval deficit Awareness: Appears intact Problem Solving: Impaired Problem Solving Impairment: Verbal basic Cognition Arousal/Alertness: Awake/alert Behavior During Therapy: WFL for tasks assessed/performed Overall Cognitive Status: Impaired/Different from baseline Area of Impairment: Orientation, Attention, Memory, Following commands, Safety/judgement, Awareness, Problem solving Orientation Level: Disoriented to, Time, Situation Current Attention Level: Selective Memory: Decreased short-term memory, Decreased recall of precautions Following Commands: Follows one step commands consistently, Follows one step commands with increased time Safety/Judgement: Decreased awareness of safety, Decreased awareness of deficits Awareness: Emergent Problem Solving: Slow processing, Difficulty sequencing, Requires verbal cues General Comments: Pt with decreased sequencing abilities during functional mobility making maneuvering RW difficult.  Physical Exam: Blood pressure (!) 173/105, pulse 77, temperature 97.9 F (36.6 C), temperature source Oral, resp. rate 18, height 5\' 11"  (1.803 m), weight 72.6 kg (160 lb 0.9 oz), SpO2 100 %. Physical Exam  Nursing note and vitals reviewed. Constitutional: He is oriented to person, place, and time. He appears well-developed and well-nourished.  HENT:  Head: Normocephalic and atraumatic.  Mouth/Throat: Oropharynx is clear and moist.  Eyes: Conjunctivae and EOM are normal. Pupils are equal, round, and reactive to light.  Neck: Normal range of motion. Neck supple.  Cardiovascular: Normal rate and regular rhythm.   Respiratory: Effort normal. No stridor. No respiratory distress. He has decreased breath sounds. He has no wheezes.  Occasional cough  GI: Soft. Bowel sounds are normal. He  exhibits no distension. There is no tenderness.  Musculoskeletal: He exhibits no edema or tenderness.  Neurological: He is alert and oriented to person, place, and time.  Mild dysarthria with deep voice. Intelligible. Fair insight and awareness.   Able to follow simple commands without difficulty. Dysmetria with right finger to nose more than left. .  Sensory deficits RUE and RLE.  Motor: 4+/5 throughout bilateral UE and LE's   Skin: Skin is warm and dry. No rash noted. No erythema.  Psychiatric: He has a normal mood and affect. His behavior is normal. Thought content normal.    No results found for this or any previous visit (from the past 48 hour(s)). No results found.     Medical Problem List and Plan: 1.  Balance, swallowing, communication and functional deficits secondary to right cerebellar and pontine infarcts  -admit to inpatient rehab 2.  DVT Prophylaxis/Anticoagulation: Pharmaceutical: Heparin 3. Pain Management: N/A 4. Mood: Has a positive outlook and supportive family. LCSW to follow for evaluation and support.  5. Neuropsych: This patient is capable of making decisions on his own behalf. 6. Skin/Wound Care: routine pressure relief measures.  7. Fluids/Electrolytes/Nutrition: Monitor I/O. Check lytes in am.  8. HTN: Monitor BP bid with permissive HTN.  Continue Norvasc daily 9.  Malignant mesothelioma with recurrent pleural effusion: Lifespan of less than 6 months.  10.  A fib: Off Eliquis--to be resumed  11. Hypokalemia: resolved with supplement.  12. T2DM with Hgb A1C-6.9: Fasting/random BS have ranged from 110 -130. Will have RD educate on  appropriate food choices but will not limit diet due to QOL issues.  13. Dyslipidemia: On Crestor.     Post Admission Physician Evaluation: 1. Functional deficits secondary  to right cerebellar and pontine infarcts. 2. Patient  is admitted to receive collaborative, interdisciplinary care between the physiatrist, rehab nursing  staff, and therapy team. 3. Patient's level of medical complexity and substantial therapy needs in context of that medical necessity cannot be provided at a lesser intensity of care such as a SNF. 4. Patient has experienced substantial functional loss from his/her baseline which was documented above under the "Functional History" and "Functional Status" headings.  Judging by the patient's diagnosis, physical exam, and functional history, the patient has potential for functional progress which will result in measurable gains while on inpatient rehab.  These gains will be of substantial and practical use upon discharge  in facilitating mobility and self-care at the household level. 5. Physiatrist will provide 24 hour management of medical needs as well as oversight of the therapy plan/treatment and provide guidance as appropriate regarding the interaction of the two. 6. The Preadmission Screening has been reviewed and patient status is unchanged unless otherwise stated above. 7. 24 hour rehab nursing will assist with bladder management, bowel management, safety, skin/wound care, disease management, medication administration, pain management and patient education  and help integrate therapy concepts, techniques,education, etc. 8. PT will assess and treat for/with: Lower extremity strength, range of motion, stamina, balance, functional mobility, safety, adaptive techniques and equipment, vestibular mgt, family education.   Goals are: mod I to supervision. 9. OT will assess and treat for/with: ADL's, functional mobility, safety, upper extremity strength, adaptive techniques and equipment, NMR, vestibular mgt, family ed.   Goals are: supervision to mod I. Therapy may proceed with showering this patient. 10. SLP will assess and treat for/with: swallowing, communication, cognition.  Goals are: supervision to mod I. 11. Case Management and Social Worker will assess and treat for psychological issues and discharge  planning. 12. Team conference will be held weekly to assess progress toward goals and to determine barriers to discharge. 13. Patient will receive at least 3 hours of therapy per day at least 5 days per week. 14. ELOS: 13-17 days       15. Prognosis:  excellent     Meredith Staggers, MD, [facility] 01/09/2017  Bary Leriche, Hershal Coria 01/09/2017

## 2017-01-09 NOTE — Progress Notes (Signed)
Pt d/c to 4w21. Report called. Assessment stable. Family made aware

## 2017-01-09 NOTE — Discharge Summary (Signed)
Stroke Discharge Summary  Patient ID: Thomas [name]   MRN: [id number]      DOB: [birth year]  Date of Admission: 01/05/2017 Date of Discharge: 01/09/2017  Attending Physician:  Garvin Fila, MD, Stroke MD Consultant(s):  Romona Curls, NP (palliative care), Delice Lesch, MD (Physical Medicine & Rehabtilitation)  Patient's PCP:  Marin Olp, MD  Discharge Diagnoses:  Principal Problem:   Stroke due to embolism of posterior inferior cerebral artery ([place]) d/t AF Active Problems:   Hyperlipidemia   Alcohol abuse   Mesothelioma of left lung [facility])   Palliative care by specialist   Benign essential HTN   Esophageal stricture   PAF (paroxysmal atrial fibrillation) ([place])   Recurrent left pleural effusion   Dysphagia, post-stroke   Type 2 diabetes mellitus with circulatory disorder ([place])   Acute blood loss anemia  Past Medical History:  Diagnosis Date  . Adenomatous colon polyp 2009  . Allergy   . Carotid artery occlusion   . Diverticulosis of colon (without mention of hemorrhage)   . ED (erectile dysfunction)   . Esophageal stricture   . Esophagitis   . GERD (gastroesophageal reflux disease)   . Goals of care, counseling/discussion 12/23/2016  . Hiatal hernia   . Hypertension   . Prostate cancer ([place])   . Unspecified gastritis and gastroduodenitis without mention of hemorrhage    Past Surgical History:  Procedure Laterality Date  . ENDARTERECTOMY Left 03/05/2013   Procedure: ENDARTERECTOMY CAROTID;  Surgeon: Conrad , MD;  Location: [place];  Service: Vascular;  Laterality: Left;  . ESOPHAGOGASTRODUODENOSCOPY (EGD) WITH ESOPHAGEAL DILATION    . PROSTATE SURGERY     Laser surgery    Medications to be continued on Rehab . amLODipine  5 mg Oral Daily  . aspirin EC  325 mg Oral Daily  . heparin  5,000 Units Subcutaneous Q8H  . rosuvastatin  10 mg Oral Daily    LABORATORY STUDIES CBC    Component Value Date/Time   WBC 5.7 01/05/2017 1226   RBC 4.52  01/05/2017 1226   HGB 12.8 (L) 01/05/2017 1226   HGB 12.1 (L) 12/21/2016 1442   HCT 39.0 01/05/2017 1226   HCT 36.5 (L) 12/21/2016 1442   PLT 299 01/05/2017 1226   PLT 301 12/21/2016 1442   MCV 86.3 01/05/2017 1226   MCV 86.6 12/21/2016 1442   MCH 28.3 01/05/2017 1226   MCHC 32.8 01/05/2017 1226   RDW 15.2 01/05/2017 1226   RDW 15.4 (H) 12/21/2016 1442   LYMPHSABS 0.7 01/05/2017 1226   LYMPHSABS 0.6 (L) 12/21/2016 1442   MONOABS 1.1 (H) 01/05/2017 1226   MONOABS 0.9 12/21/2016 1442   EOSABS 0.0 01/05/2017 1226   EOSABS 0.1 12/21/2016 1442   BASOSABS 0.0 01/05/2017 1226   BASOSABS 0.0 12/21/2016 1442   CMP    Component Value Date/Time   NA 137 01/07/2017 0343   NA 137 12/21/2016 1442   K 3.8 01/07/2017 0343   K 3.7 12/21/2016 1442   CL 107 01/07/2017 0343   CO2 22 01/07/2017 0343   CO2 26 12/21/2016 1442   GLUCOSE 130 (H) 01/07/2017 0343   GLUCOSE 119 12/21/2016 1442   BUN 18 01/07/2017 0343   BUN 17.1 12/21/2016 1442   CREATININE 1.10 01/07/2017 0343   CREATININE 1.2 12/21/2016 1442   CALCIUM 8.7 (L) 01/07/2017 0343   CALCIUM 9.6 12/21/2016 1442   PROT 7.6 01/05/2017 1226   PROT 7.9 12/21/2016 1442   ALBUMIN 3.3 (L)  01/05/2017 1226   ALBUMIN 3.2 (L) 12/21/2016 1442   AST 17 01/05/2017 1226   AST 13 12/21/2016 1442   ALT 14 (L) 01/05/2017 1226   ALT 20 12/21/2016 1442   ALKPHOS 81 01/05/2017 1226   ALKPHOS 97 12/21/2016 1442   BILITOT 0.7 01/05/2017 1226   BILITOT 0.51 12/21/2016 1442   GFRNONAA 57 (L) 01/07/2017 0343   GFRAA >60 01/07/2017 0343   COAGS Lab Results  Component Value Date   INR 1.19 01/05/2017   INR 1.36 12/04/2016   INR 1.11 03/04/2013   Lipid Panel    Component Value Date/Time   CHOL 131 01/06/2017 0213   TRIG 91 01/06/2017 0213   HDL 28 (L) 01/06/2017 0213   CHOLHDL 4.7 01/06/2017 0213   VLDL 18 01/06/2017 0213   LDLCALC 85 01/06/2017 0213   HgbA1C  Lab Results  Component Value Date   HGBA1C 6.9 (H) 01/06/2017    Urinalysis    Component Value Date/Time   COLORURINE YELLOW 01/05/2017 1930   APPEARANCEUR CLEAR 01/05/2017 1930   LABSPEC >1.046 (H) 01/05/2017 1930   PHURINE 5.0 01/05/2017 1930   GLUCOSEU 50 (A) 01/05/2017 1930   HGBUR SMALL (A) 01/05/2017 1930   HGBUR negative 04/20/2010 0000   BILIRUBINUR NEGATIVE 01/05/2017 1930   BILIRUBINUR n 05/18/2015 1123   KETONESUR 20 (A) 01/05/2017 1930   PROTEINUR 100 (A) 01/05/2017 1930   UROBILINOGEN 0.2 05/18/2015 1123   UROBILINOGEN 1.0 07/21/2013 1825   NITRITE NEGATIVE 01/05/2017 1930   LEUKOCYTESUR NEGATIVE 01/05/2017 1930   Urine Drug Screen     Component Value Date/Time   LABOPIA NONE DETECTED 01/05/2017 1920   COCAINSCRNUR NONE DETECTED 01/05/2017 1920   LABBENZ NONE DETECTED 01/05/2017 1920   AMPHETMU NONE DETECTED 01/05/2017 1920   THCU NONE DETECTED 01/05/2017 1920   LABBARB NONE DETECTED 01/05/2017 1920    Alcohol Level    Component Value Date/Time   ETH <5 01/05/2017 1226     SIGNIFICANT DIAGNOSTIC STUDIES Ct Head Code Stroke Wo Contrast 01/05/2017 1. Acute to subacute infarct in the inferior right cerebellum.  2. Small to moderate remote infarcts in the bilateral occipital lobes.  3. Diffuse chronic small vessel ischemia in the cerebral white matter.   Ct Angio Head W Or Wo Contrast Ct Angio Neck W Or Wo Contrast 01/05/2017 1. Interval occlusion of the right V3 and V4 segments with intermittent tenuous blood flow in the basilar and bilateral posterior cerebral arteries. There is underlying severe right V4 stenoses by prior MRA. (left prevertebral artery ends in PICA and there is no significant posterior communicating arteries).  2. Advanced intracranial atherosclerosis with high-grade right cavernous and distal M1 segment stenoses.  3. Severe atherosclerosis of the bilateral cervical carotids with left mid ICA intimal flap causing ~50% narrowing.  4. Acute to subacute right cerebellar infarct.  5. Complex left pleural  effusion with history of mesothelioma.   MRI Brain Wo Contrast 01/06/2017 Acute RIGHT cerebellar and pontine infarcts (including RIGHT PICA), with petechial hemorrhage. Otherwise stable appearance including old bilateral occipital lobe, LEFT frontal and LEFT parietal lobe infarcts, moderate to severe chronic small vessel ischemic disease.  2D Echocardiogram  - Left ventricle: The cavity size was normal. Wall thickness was increased in a pattern of mild LVH. Systolic function was normal. The estimated ejection fraction was in the range of 50% to 55%. Wall motion was normal; there were no regional wall motion abnormalities. - Aortic valve: There was mild regurgitation. - Mitral valve: Calcified annulus.  There was mild regurgitation. - Left atrium: The atrium was mildly dilated. - Pericardium, extracardiac: There was a pleural effusion. Impressions:   Normal LV systolic function; mild LVH; sclerotic aortic valve with mild AI; mild MR; mild LAE.     HISTORY OF PRESENT ILLNESS Thomas Johnsonis a 83 y.o.malewith recent occipital stroke who presents with 2 days of worsening general malaise/dizziness with acute worsening of facial weakness/dysarthria today 01/05/2017 around 11 AM. Of note, he is currently being referred to hospice with expectation of expectancy less than 6 months due to malignant mesothelioma. The family has a meeting with palliative care set up for tomorrow, but has not yet had many discussions regarding limitations of care. Modified Rankin Score: 2   CT head was performed which demonstrates a large PICA infarct, CTA was performed which demonstrates distal vertebral occlusion with retrograde basilar filling. he was not a tPA candidate d/t delay in arrival and on Eliquis. Dr. Leonel Ramsay suspected that he is at very high risk of worsening, but thinks that endovascular intervention in this setting could carry significant risk as well and especially in someone with his current medical  problems would be fairly aggressive.  Dr. Leonel Ramsay discussed with family various options indicating that it was not certain which would be superior, but that he favored observation with the understanding that there was potential for significant worsening. He discussed with him that if he does have significant worsening, he would likely be a debilitating stroke and in the setting would hewant intubation, and both the patient and the family agreed that he would not wantintubation in the setting.   HOSPITAL COURSE Thomas [name] is a 83 y.o. male with history of prostate cancer, hypertension, paroxysmal atrial fibrillation on Eliquis prior to admission, carotid artery occlusion, and malignant mesothelioma with DO NOT RESUSCITATE order presenting with facial weakness, dysarthria, malaise, and dizziness. He did not receive IV t-PA due to anticoagulation, out of the window.  Stroke:  PICA infarct - embolic secondary to atrial fibrillation.  Resultant  Dysmetria, dysarthria, hemiplegia which has considerably improved  CT head - Acute to subacute infarct in the inferior right cerebellum.   CTA H&N - Interval occlusion of the right V3 and V4 segments with intermittent tenuous blood flow in the basilar and bilateral posterior cerebral arteries  MRI head - Acute RIGHT cerebellar and pontine infarcts, with petechial hemorrhage.  2D Echo - EF 50-55%. No cardiac source of emboli identified.  LDL - 85  HgbA1c - 6.9  Eliquis (apixaban) daily prior to admission, now on aspirin 325 mg daily. On hold due to large size of stroke - see AF below  Therapy recommendations: CIR  Disposition: CIR  Atrial Fibrillation  Home anticoagulation:  Eliquis (apixaban) daily   Given the size of the posterior stroke, Eliquis discontinued, changed to aspirin  Dr. Leonie Man discussed alternative treatment options and lack of definite data suggesting switching eliquis to Xarelto, Pradaxa or Sayvasa being  superior. Family understands and want to continue eliquis  Eliquis may be restarted in 2 weeks post stroke  Consider asa 81mg  in addition to the Eliquis.  Stroke Team available for consult if needed prior to restarting Eliquis  Patient counseled to be compliant with his antithrombotic medications  Hypertension  Stable - but on the high side.   Home meds resumed  Consider additional BP control if does not continue to lower independently  Long-term BP goal normotensive  Hyperlipidemia  Home meds:  Crestor 10 mg daily resumed in hospital  LDL  85, goal < 70  Continue statin at discharge  Diabetes, type II  HgbA1c 6.9, at goal < 7.0  Other Stroke Risk Factors  Advanced age  Former cigarette smoker quit 63 years ago.  ETOH use, advised to drink no more than 1 drink per day.  Hx stroke/TIA - remote infarcts on head CT.  Other Active Problems  Palliative care consulted for end-stage mesothelioma - DNR, Limitations on Scope of Treatment: Avoid Hospitalization, No Artificial Feeding, No Chemotherapy, No Hemodialysis, No Radiation, No Surgical Procedures and No Tracheostomy. Plan OP followup  Hypokalemia - 3.1 -> 3.8  Mildly elevated creatinine 1.28 -> 1.10   DISCHARGE EXAM Blood pressure (!) 177/108, pulse 75, temperature 98 F (36.7 C), temperature source Oral, resp. rate 18, height 5\' 11"  (1.803 m), weight 72.6 kg (160 lb 0.9 oz), SpO2 100 %. Exam: Gen: Pleasant elderly African-American male not in distress                  CV: RRR, no MRG. No Carotid Bruits. No peripheral edema, warm, nontender Eyes: Conjunctivae clear without exudates or hemorrhage Neuro: Detailed Neurologic Exam Speech:    Speech is minimally dysarthric, and can speak sentences clearly Cognition:    The patient is oriented to person, place, month, not year,     recent and remote memory impaired;  Cranial Nerves:    The pupils are equal, round, and reactive to light. Visual fields are  full to threat. Right esotropia but xtraocular movements are intact. Trigeminal sensation is intact and the muscles of mastication are normal. Right facial droop. The palate elevates in the midline. Hearing intact. Voice is normal. Shoulder shrug is normal. The tongue has normal motion without fasciculations.  Coordination: right-sided dysmetria which is improved today significantly Motor Observation:    no involuntary movements noted. Tone:    Normal muscle tone.   Strength:    Strength is intact and symmetric in the upper and lower limbs.  Sensation: intact to LT Gait deferred  Discharge Diet  DIET DYS 3 Room service appropriate? Yes; Fluid consistency: Thin liquids  DISCHARGE PLAN  Disposition:  Transfer to [place] for ongoing PT, OT and ST  aspirin 325 mg daily for secondary stroke prevention. Resume eliquis in 2 weeks.   Recommend ongoing risk factor control by Primary Care Physician at time of discharge from inpatient rehabilitation.  Follow-up Marin Olp, MD in 2 weeks following discharge from rehab.  Follow-up with Dr. Antony Contras, Stroke Clinic in 6 weeks, office to schedule an appointment.   45 minutes were spent preparing discharge.  I have personally examined this patient, reviewed notes, independently viewed imaging studies, participated in medical decision making and plan of care.ROS completed by me personally and pertinent positives fully documented  I have made any additions or clarifications directly to the above note. Agree with note above.   Antony Contras, MD Medical Director [facility] Pager: [phone] 01/11/2017 12:47 PM

## 2017-01-09 NOTE — Progress Notes (Signed)
  Speech Language Pathology Treatment: Dysphagia  Patient Details Name: Thomas [name] MRN: [id number] DOB: [birth year] Today's Date: 01/09/2017 Time: 2229-7989 SLP Time Calculation (min) (ACUTE ONLY): 9 min  Assessment / Plan / Recommendation Clinical Impression  Pt consumed thin liquids and soft solids with no overt s/s of aspiration and Min cues provided for use of liquid wash to clear lingual residue. Pt/family deny any coughing with meals since upgrade on previous date. Recommend to continue with Dys 3 diet and thin liquids with straws okay. Continue to recommend CIR.   HPI HPI: 83 y.o. male with history of paroxysmal atrial fibrillation, recently diagnosed mesothelioma with recurrent pleural effusion, history of occipital stroke with hospitalization 12/13/16, GERD, esophageal stricture s/p multiple dilations, esophagitis, hiatal hernia. Presented 01/05/17 with 2 day history of increased weakness, facial drooping, slurred speech. CT showing acute interval extensive infarct in the right cerebellum, predominately inferiorly but also some involvement of the superior hemisphere. MRI pending. Seen by SLP during recent admission for cognitive linguistic evaluation only.       SLP Plan  Continue with current plan of care       Recommendations  Diet recommendations: Dysphagia 3 (mechanical soft);Thin liquid Liquids provided via: Cup;Straw Medication Administration: Whole meds with puree Supervision: Patient able to self feed;Intermittent supervision to cue for compensatory strategies Compensations: Slow rate;Small sips/bites Postural Changes and/or Swallow Maneuvers: Seated upright 90 degrees                Oral Care Recommendations: Oral care BID Follow up Recommendations: Inpatient Rehab SLP Visit Diagnosis: Dysphagia, oropharyngeal phase (R13.12) Plan: Continue with current plan of care       GO                Germain Osgood 01/09/2017, 4:16 PM  Germain Osgood, M.A.  CCC-SLP [phone]

## 2017-01-09 NOTE — Progress Notes (Signed)
Jamse Arn, MD Physician Signed Physical Medicine and Rehabilitation  Consult Note Date of Service: 01/08/2017 3:24 PM  Related encounter: ED to Hosp-Admission (Current) from 01/05/2017 in [facility]   [] Hide copied text [] Hover for attribution information      Physical Medicine and Rehabilitation Consult  Reason for Consult: Stroke with ataxia and difficulty walking Referring Physician: Dr. Leonie Man   HPI: Thomas [name] is a 83 y.o. 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  History taken from chart review and family. CTA head/neck showed acute to subacute right cerebellar infarct with interval occlusion of right V3 and V4 segments with intermittent tenuous blood flow in basilar and bilateral PCA and severe atherosclerosis of bilateral cervical carotids with left mid ICA intimal flap causing 50% stenosis.  Eliquis changed to ASA due to size of stroke with recommendations to resume in two weeks. He was made NPO due to severe aspiration risk and MBS done revealing delayed swallow with sensed aspiration of thins--dysphagia 3, nectar recommended.  MRI brain reviewed, showing right cerebellar and pontine infarct. Per report, acute right cerebellar and pontine infarcts with petechial hemorrhage and stable old bilateral occipital and left frontal/parietal infarcts. Diet advanced to thin liquids today.  Patient with resultant balance deficits with ataxia and CIR recommended for follow up therapy.   Was independent and driving prior to May and did not require AD.  He was able to sit up in a chair for 2-3 hours today.   Review of Systems  HENT: Positive for hearing loss.   Eyes: Negative for blurred vision and double  vision.  Respiratory: Negative for cough and shortness of breath.   Cardiovascular: Negative for chest pain and palpitations.  Gastrointestinal: Negative for abdominal pain, heartburn and nausea.  Genitourinary: Negative for dysuria and urgency.  Musculoskeletal: Negative for back pain and myalgias.  Neurological: Positive for sensory change and weakness. Negative for dizziness and headaches.  Psychiatric/Behavioral: Positive for memory loss. The patient is not nervous/anxious.   All other systems reviewed and are negative.         Past Medical History:  Diagnosis Date  . Adenomatous colon polyp 2009  . Allergy   . Carotid artery occlusion   . Diverticulosis of colon (without mention of hemorrhage)   . ED (erectile dysfunction)   . Esophageal stricture   . Esophagitis   . GERD (gastroesophageal reflux disease)   . Goals of care, counseling/discussion 12/23/2016  . Hiatal hernia   . Hypertension   . Prostate cancer ([place])   . Unspecified gastritis and gastroduodenitis without mention of hemorrhage          Past Surgical History:  Procedure Laterality Date  . ENDARTERECTOMY Left 03/05/2013   Procedure: ENDARTERECTOMY CAROTID;  Surgeon: Conrad [place], MD;  Location: [facility];  Service: Vascular;  Laterality: Left;  . ESOPHAGOGASTRODUODENOSCOPY (EGD) WITH ESOPHAGEAL DILATION    . PROSTATE SURGERY     Laser surgery         Family History  Problem Relation Age of Onset  . Heart disease Father        unclear specifics  . Cancer Mother   . Breast cancer Sister   . Hyperlipidemia Daughter   . Hypertension Daughter   . Hypertension Son  Social History:  Married. Independent and using a walker since last stroke. Per reports that he quit smoking about 63 years ago. His smoking use included Cigarettes. He has a 2.00 pack-year smoking history. He has never used smokeless tobacco. He reports that he drinks alcohol. He reports that he does not use  drugs.          Allergies  Allergen Reactions  . Ace Inhibitors Hives  . Penicillins Swelling    Swelling  of the face. Has patient had a PCN reaction causing immediate rash, facial/tongue/throat swelling, SOB or lightheadedness with hypotension: YES Has patient had a PCN reaction causing severe rash involving mucus membranes or skin necrosis: NO Has patient had a PCN reaction that required hospitalizationNO Has patient had a PCN reaction occurring within the last 10 years: NO If all of the above answers are "NO", then may proceed with Cephalosporin use.          Medications Prior to Admission  Medication Sig Dispense Refill  . amLODipine (NORVASC) 5 MG tablet Take 1 tablet (5 mg total) by mouth daily. 30 tablet 3  . apixaban (ELIQUIS) 5 MG TABS tablet Take 5 mg by mouth 2 (two) times daily.    . cloNIDine (CATAPRES) 0.1 MG tablet TAKE ONE TABLET BY MOUTH THREE TIMES DAILY 90 tablet 4  . metoCLOPramide (REGLAN) 5 MG tablet Take 1 tablet (5 mg total) by mouth every 8 (eight) hours as needed for nausea. 15 tablet 0  . rosuvastatin (CRESTOR) 10 MG tablet Take 1 tablet (10 mg total) by mouth daily. 92 tablet 3  . meclizine (ANTIVERT) 25 MG tablet Take 1 tablet (25 mg total) by mouth 3 (three) times daily as needed for dizziness. (Patient not taking: Reported on 01/05/2017) 30 tablet 0    Home: [place] expects to be discharged to:: Private residence Living Arrangements: Spouse/significant other, Children Available Help at Discharge: Family, Available PRN/intermittently Type of Home: House Home Access: Stairs to enter [employer] of Steps: 2 Entrance Stairs-Rails: Right Home Layout: One level Bathroom Shower/Tub: Tub/shower unit, Door [employer] Toilet: Standard Bathroom Accessibility: Yes Home Equipment: Environmental consultant - 2 wheels, Cane - single point, Bedside commode, Shower seat  Lives With: Spouse, Son  Functional History: Prior Function Level of  Independence: Independent with assistive device(s) Comments: was getting around with RW without A until recent CVA Functional Status:  Mobility: Bed Mobility Overal bed mobility: Needs Assistance Bed Mobility: Supine to Sit Supine to sit: Min guard General bed mobility comments: Increased time with close guard at the trunk for safety. Poor sequencing, but able to correct with time.  Transfers Overall transfer level: Needs assistance Equipment used: Rolling walker (2 wheeled) Transfers: Sit to/from Stand Sit to Stand: Min assist General transfer comment: VC's for hand placement on seated surface for safety. Pt required assist for balance support and to recover from large LOB upon first stand.  Ambulation/Gait Ambulation/Gait assistance: Mod assist, +2 safety/equipment Ambulation Distance (Feet): 100 Feet Assistive device: Rolling walker (2 wheeled) Gait Pattern/deviations: Ataxic, Decreased step length - right, Decreased step length - left, Step-through pattern, Drifts right/left, Trunk flexed General Gait Details: With RW, pt was able to ambulate with improved gait and balance and mod assist from therapist for LOB to the right. Occasional repositioning of walker was required as well to maintain safe distance. Gait velocity: Decreased Gait velocity interpretation: Below normal speed for age/gender  ADL: ADL Overall ADL's : Needs assistance/impaired Eating/Feeding: Set up, Sitting Grooming: Minimal assistance, Standing  Upper Body Bathing: Supervision/ safety, Set up, Sitting Lower Body Bathing: Minimal assistance, Sit to/from stand Upper Body Dressing : Supervision/safety, Set up, Sitting Lower Body Dressing: Sit to/from stand, Minimal assistance Toilet Transfer: Moderate assistance, +2 for safety/equipment, Ambulation, RW, BSC Toileting- Clothing Manipulation and Hygiene: Moderate assistance, Sit to/from stand Functional mobility during ADLs: Moderate assistance, +2 for  safety/equipment, Rolling walker General ADL Comments: Pt with significant ataxia during mobility limiting safety. R and forward lean requiring physical assistance to correct.   Cognition: Cognition Overall Cognitive Status: Impaired/Different from baseline Arousal/Alertness: Awake/alert Orientation Level: Oriented to person, Oriented to place, Disoriented to situation, Disoriented to time Attention: Sustained Sustained Attention: Impaired Sustained Attention Impairment: Verbal basic, Functional basic Memory: Impaired Memory Impairment: Storage deficit, Retrieval deficit Awareness: Appears intact Problem Solving: Impaired Problem Solving Impairment: Verbal basic Cognition Arousal/Alertness: Awake/alert Behavior During Therapy: WFL for tasks assessed/performed Overall Cognitive Status: Impaired/Different from baseline Area of Impairment: Orientation, Attention, Memory, Following commands, Safety/judgement, Awareness, Problem solving Orientation Level: Disoriented to, Time, Situation Current Attention Level: Selective Memory: Decreased short-term memory, Decreased recall of precautions Following Commands: Follows one step commands consistently, Follows one step commands with increased time Safety/Judgement: Decreased awareness of safety, Decreased awareness of deficits Awareness: Emergent Problem Solving: Slow processing, Difficulty sequencing, Requires verbal cues General Comments: Pt with decreased sequencing abilities during functional mobility making maneuvering RW difficult.   Blood pressure (!) 154/85, pulse 78, temperature 98.9 F (37.2 C), temperature source Oral, resp. rate 20, height 5\' 11"  (1.803 m), weight 72.6 kg (160 lb 0.9 oz), SpO2 98 %. Physical Exam  Nursing note and vitals reviewed. Constitutional: He is oriented to person, place, and time. He appears well-developed and well-nourished.  persistent hiccups noted.   HENT:  Head: Normocephalic and atraumatic.    Mouth/Throat: Oropharynx is clear and moist.  Eyes: Conjunctivae and EOM are normal. Pupils are equal, round, and reactive to light.  Neck: Normal range of motion. Neck supple.  Cardiovascular: Normal rate and regular rhythm.   Respiratory: Effort normal and breath sounds normal. No stridor. No respiratory distress. He has no wheezes.  GI: Soft. Bowel sounds are normal. He exhibits no distension. There is no tenderness.  Musculoskeletal: He exhibits no edema or tenderness.  Neurological: He is alert and oriented to person, place, and time.  Mild dysarthria.  Able to follow simple commands without difficulty. Dysmetria with right finger to nose.  Sensory deficits RUE and LLE.  Motor: 4+/5 throughout  Skin: Skin is warm and dry.  Psychiatric: He has a normal mood and affect. His behavior is normal. Thought content normal.    Lab Results Last 24 Hours  No results found for this or any previous visit (from the past 24 hour(s)).    Imaging Results (Last 48 hours)  Mr Brain Wo Contrast  Result Date: 01/06/2017 CLINICAL DATA:  Two days of worsening dizziness, facial weakness and dysarthria, worsening at 11 a.m. today. On hospice for malignant mesothelioma. Follow-up occipital stroke. EXAM: MRI HEAD WITHOUT CONTRAST TECHNIQUE: Multiplanar, multiecho pulse sequences of the brain and surrounding structures were obtained without intravenous contrast. COMPARISON:  CT angiogram of the head and neck Jan 05, 2017 and MRI of the head Dec 13, 2016 FINDINGS: BRAIN: Confluent reduced diffusion RIGHT inferior cerebellum with low ADC values. Additional scattered subcentimeter foci of reduced diffusion RIGHT pons, RIGHT superior cerebellum. Residual punctate reduced diffusion RIGHT mesial occipital lobe. Faint susceptibility artifact RIGHT cerebellum compatible with petechial hemorrhage. Susceptibility artifact again seen along the RIGHT vertebral  artery consistent with known occlusion. Regional mass  effect/cytotoxic edema RIGHT inferior cerebellum, RIGHT inferior cerebral peduncle. Bioccipital lobe encephalomalacia again noted. Small area LEFT frontal LEFT parietal lobe encephalomalacia. Ventricles and sulci are overall normal for patient's age. Confluent supratentorial white matter FLAIR T2 hyperintensities. No midline shift or masses. No abnormal extra-axial fluid collections. VASCULAR: Attenuated RIGHT vertebral artery flow void. Maintained anterior circulation. SKULL AND UPPER CERVICAL SPINE: No abnormal sellar expansion. No suspicious calvarial bone marrow signal. Craniocervical junction maintained. SINUSES/ORBITS: Small RIGHT maxillary mucosal retention cyst. Small bilateral mastoid effusions. The included ocular globes and orbital contents are non-suspicious. Status post bilateral ocular lens implants. OTHER: Patient is edentulous. IMPRESSION: Acute RIGHT cerebellar and pontine infarcts (including RIGHT PICA), with petechial hemorrhage. Otherwise stable appearance including old bilateral occipital lobe, LEFT frontal and LEFT parietal lobe infarcts, moderate to severe chronic small vessel ischemic disease. Acute findings text paged to [place], Neurology via AM[employer] secure system on 01/06/2017 at 10:16 pm. Electronically Signed   By: Elon Alas M.D.   On: 01/06/2017 22:18     Assessment/Plan: Diagnosis: Right cerebellar and pontine infarct Labs and images independently reviewed.  Records reviewed and summated above. Stroke: Continue secondary stroke prophylaxis and Risk Factor Modification listed below:   Antiplatelet therapy:  Blood Pressure Management:  Continue current medication with prn's with permisive HTN per primary team Statin Agent:   Diabetes management:    1. Does the need for close, 24 hr/day medical supervision in concert with the patient's rehab needs make it unreasonable for this patient to be served in a less intensive setting? Yes  2. Co-Morbidities requiring  supervision/potential complications: HTN (monitor and provide prns in accordance with increased physical exertion and pain), esophageal stricture, PAF (monitor HR with increased activity), recurrent left pleural effusion, malignant mesothelioma (hospice care), dysphagia (advance diet as tolerated), DM (Monitor in accordance with exercise and adjust meds as necessary), ABLA (transfuse if necessary to ensure appropriate perfusion for increased activity tolerance) 3. Due to safety, disease management and patient education, does the patient require 24 hr/day rehab nursing? Yes 4. Does the patient require coordinated care of a physician, rehab nurse, PT (1-2 hrs/day, 5 days/week), OT (1-2 hrs/day, 5 days/week) and SLP (1-2 hrs/day, 5 days/week) to address physical and functional deficits in the context of the above medical diagnosis(es)? Yes Addressing deficits in the following areas: balance, endurance, locomotion, transferring, bathing, toileting, swallowing and psychosocial support 5. Can the patient actively participate in an intensive therapy program of at least 3 hrs of therapy per day at least 5 days per week? Yes 6. The potential for patient to make measurable gains while on inpatient rehab is excellent 7. Anticipated functional outcomes upon discharge from inpatient rehab are modified independent and supervision  with PT, modified independent and supervision with OT, independent with SLP. 8. Estimated rehab length of stay to reach the above functional goals is: 13-17 days. 9. Anticipated D/C setting: Home 10. Anticipated post D/C treatments: HH therapy and Home excercise program 11. Overall Rehab/Functional Prognosis: fair  RECOMMENDATIONS: This patient's condition is appropriate for continued rehabilitative care in the following setting: CIR Patient has agreed to participate in recommended program. Yes Note that insurance prior authorization may be required for reimbursement for recommended  care.  Comment: Rehab Admissions Coordinator to follow up.  Delice Lesch, MD, Mellody Drown Bary [place], [place] 01/08/2017    Revision History  Routing History

## 2017-01-09 NOTE — Progress Notes (Signed)
Occupational Therapy Treatment Patient Details Name: Thomas [name] MRN: [id number] DOB: 12-23-1925 Today's Date: 01/09/2017    History of present illness Pt is an 83 y.o. male with recent B occipital stroke who presented to the ED with 2 days of worsening malaise/dizziness with acute worsening of facial weakness/dysarthria. MRI revealing R cerebellar and pontine infarcts (including R PICA) with petechial hemorrhage and stable old B occipital, L frontal, and L parietal infarcts. PMH significant for recent diagnosis of malignant mesothelioma, adenomatous colon polyp, esophageal stricture, esophagitis, GERD, prostate cancer, hypertension, hiatal hernia, and unspecified gastritis and gastroduodenitis without mention of hemorrhage.    OT comments  Pt progressing toward OT goals. He continues to demonstrate confusion throughout session. Able to complete LB dressing and bathing tasks this session with min guard assist and questioning cues for sequencing. Pt with difficulty sequencing task and initially attempting to apply lotion without doffing socks first. Challenged standing UE and LE coordination in preparation for improved independence with standing ADL tasks. Pt demonstrating improved tolerance for activity this session and updated D/C recommendation to CIR as pt will be able to tolerate this level of rehabilitation at this time. OT will continue to follow acutely.    Follow Up Recommendations  CIR;Supervision/Assistance - 24 hour    Equipment Recommendations  None recommended by OT    Recommendations for Other Services Rehab consult    Precautions / Restrictions Precautions Precautions: Fall Restrictions Weight Bearing Restrictions: No       Mobility Bed Mobility Overal bed mobility: Needs Assistance Bed Mobility: Supine to Sit;Sit to Supine     Supine to sit: Min guard Sit to supine: Min guard   General bed mobility comments: Min guard assist for safety.   Transfers Overall  transfer level: Needs assistance Equipment used: Rolling walker (2 wheeled) Transfers: Sit to/from Stand Sit to Stand: Min assist         General transfer comment: VC's for hand placement on seated surface for safety. Little carryover of education on subsequent sit<>stand in preparation for standing ADL.     Balance Overall balance assessment: Needs assistance Sitting-balance support: No upper extremity supported;Feet supported Sitting balance-Leahy Scale: Fair Sitting balance - Comments: pt able to sit EOB with supervision Postural control: Right lateral lean Standing balance support: Single extremity supported;During functional activity Standing balance-Leahy Scale: Fair Standing balance comment: Reliant on single UE support and mod assist for stability during dynamic tasks.                           ADL either performed or assessed with clinical judgement   ADL Overall ADL's : Needs assistance/impaired             Lower Body Bathing: Sit to/from stand;Minimal assistance Lower Body Bathing Details (indicate cue type and reason): VC's for sequencing to doff socks prior to applying lotion to feet.     Lower Body Dressing: Minimal assistance;Sit to/from stand                 General ADL Comments: Session focused on improved coordination during standing position in preparation for standing ADL. Pt with UE and LE ataxia noted with standing activities. Difficulty alternating movements with B sides of body requiring step-by-step cues. R lateral lean throughout and able to identify and self-correct with questioning cues.       Vision       Perception     Praxis      Cognition Arousal/Alertness:  Awake/alert Behavior During Therapy: WFL for tasks assessed/performed Overall Cognitive Status: Impaired/Different from baseline Area of Impairment: Orientation;Attention;Memory;Following commands;Safety/judgement;Awareness;Problem solving                  Orientation Level: Disoriented to;Time;Situation Current Attention Level: Selective Memory: Decreased short-term memory;Decreased recall of precautions Following Commands: Follows one step commands consistently;Follows one step commands with increased time Safety/Judgement: Decreased awareness of safety;Decreased awareness of deficits Awareness: Emergent Problem Solving: Slow processing;Difficulty sequencing;Requires verbal cues General Comments: Pt with poor sequencing during ADL tasks requiring questioning and instructional cues to doff socks prior to applying lotion to feet.         Exercises Other Exercises Other Exercises: Alternating nose taps, finger to nose, toe taps, and marching to improve coordination related to ADL tasks.    Shoulder Instructions       General Comments      Pertinent Vitals/ Pain       Pain Assessment: No/denies pain  Home Living                                          Prior Functioning/Environment Level of Independence: Independent with assistive device(s)        Comments: rare use of cane or RW until recent vertigo   Frequency  Min 2X/week        Progress Toward Goals  OT Goals(current goals can now be found in the care plan section)  Progress towards OT goals: Progressing toward goals  Acute Rehab OT Goals Patient Stated Goal: to go home OT Goal Formulation: With patient/family Time For Goal Achievement: 01/21/17 Potential to Achieve Goals: Good ADL Goals Pt Will Perform Grooming: with supervision;standing Pt Will Perform Upper Body Dressing: with modified independence;sitting Pt Will Perform Lower Body Dressing: with supervision;sit to/from stand Pt Will Transfer to Toilet: with supervision;ambulating;bedside commode Pt Will Perform Toileting - Clothing Manipulation and hygiene: with supervision;sit to/from stand Pt Will Perform Tub/Shower Transfer: with supervision;ambulating;Tub transfer;shower seat;rolling  walker Pt/caregiver will Perform Home Exercise Program: Both right and left upper extremity;With Supervision;With written HEP provided;Increased strength Additional ADL Goal #1: Pt will demonstrate emergent awareness during morning ADL routine in minimally distracting environment.   Plan Discharge plan needs to be updated    Co-evaluation                 AM-PAC PT "6 Clicks" Daily Activity     Outcome Measure   Help from another person eating meals?: A Little Help from another person taking care of personal grooming?: A Little Help from another person toileting, which includes using toliet, bedpan, or urinal?: A Lot Help from another person bathing (including washing, rinsing, drying)?: A Lot Help from another person to put on and taking off regular upper body clothing?: A Little Help from another person to put on and taking off regular lower body clothing?: A Little 6 Click Score: 16    End of Session Equipment Utilized During Treatment: Gait belt;Rolling walker  OT Visit Diagnosis: Unsteadiness on feet (R26.81);Muscle weakness (generalized) (M62.81);Ataxia, unspecified (R27.0)   Activity Tolerance Patient tolerated treatment well   Patient Left in chair;with call bell/phone within reach   Nurse Communication Mobility status;Precautions        Time: 8270-7867 OT Time Calculation (min): 23 min  Charges: OT General Charges $OT Visit: 1 Procedure OT Treatments $Self Care/Home Management : 8-22 mins $Therapeutic Activity: 8-22  mins  Thomas Herrlich, MS OTR/L  Pager: [place] A Vadis Slabach 01/09/2017, 5:59 PM

## 2017-01-09 NOTE — Care Management Note (Signed)
Case Management Note  Patient Details  Name: Thomas [name] MRN: [id number] Date of Birth: [birth year]  Subjective/Objective:   Pt admitted with CVA. He is from home with spouse.                 Action/Plan: PT recommending CIR and OT rec is SNF. CM following for d/c disposition.   Expected Discharge Date:                  Expected Discharge Plan:  [place]  In-House Referral:     Discharge planning Services     Post Acute Care Choice:    Choice offered to:     DME Arranged:    DME Agency:     HH Arranged:    [place] Agency:     Status of Service:  In process, will continue to follow  If discussed at Long Length of Stay Meetings, dates discussed:    Additional Comments:  Pollie Friar, RN 01/09/2017, 4:04 PM

## 2017-01-09 NOTE — Care Management Important Message (Signed)
Important Message  Patient Details  Name: Thomas [name] MRN: [id number] Date of Birth: 04-14-1926   Medicare Important Message Given:  Yes    [name] 01/09/2017, 1:51 PM

## 2017-01-09 NOTE — Progress Notes (Signed)
Thomas Gong, RN Rehab Admission Coordinator Signed Physical Medicine and Rehabilitation  PMR Pre-admission Date of Service: 01/09/2017 1:00 PM  Related encounter: ED to Hosp-Admission (Current) from 01/05/2017 in [place]       [] Hide copied text PMR Admission Coordinator Pre-Admission Assessment  Patient: Thomas [name] is an 83 y.o., male MRN: [id number] DOB: April 19, 1926 Height: 5\' 11"  (180.3 cm) Weight: 72.6 kg (160 lb 0.9 oz)                                                                                                                                                  Insurance Information HMO: yes    PPO:      PCP:      IPA:      80/20:      OTHER: medicare advantage plan PRIMARY: Blue Medicare      Policy#: [id number]      Subscriber: pt CM Name: Thomas [name].     Phone#: [phone]     Fax#: [phone] Pre-Cert#: TBA approved for 7 days      Employer: retired Benefits:  Phone #: [phone]     Name: 01/09/2017 Eff. Date: 08/14/16     Deduct: none      Out of Pocket Max: $5500      Life Max: none CIR: $300 co pay per day days 1-6 then covers 100%      SNF: no co pay days 1-20; $167.50 co pay per day days 21-60: no co pay days 61-100 Outpatient: $40 co pay per visit     Co-Pay: visits per medical neccesity Home Health: 100%      Co-Pay: visits per medical neccesity DME: 80%     Co-Pay: 20% Providers: in network  SECONDARY: none      Medicaid Application Date:       Case Manager:  Disability Application Date:       Case Worker:   Emergency Tax adviser Information    Name Relation Home Work Mobile   Thomas [name] Daughter [phone]     Thomas [name]   [phone]   Thomas [name] Spouse [phone]       Current Medical History  Patient Admitting Diagnosis: right cerebellar and pontine infarcts  History of Present Illness:HPI: Thomas Johnsonis a 83 y.o.malewith history of HTN,  esophageal stricture, PAF, recurrent left pleural effusion, malignant mesothelioma (less than 6 months life expectancy), 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 History taken from chart review and family. CTA head/neck showed acute to subacute right cerebellar infarct with interval occlusion of right V3 and V4 segments with intermittent tenuous blood flow in basilar and bilateral PCA and severe atherosclerosis of bilateral cervical carotids with left mid ICA intimal flap causing 50% stenosis.  Eliquis changed to ASA due to size of stroke with recommendations to resume in two weeks. He was made NPO due to severe aspiration risk and MBS done revealing delayed swallow with sensed aspiration of thins--dysphagia 3, nectar recommended. MRI brain reviewed, showing right cerebellar and pontine infarct. Per report, acute right cerebellar and pontine infarcts with petechial hemorrhage and stable old bilateral occipital and left frontal/parietal infarcts. Diet advanced to thin liquids today. Patient with resultant balance deficits with ataxia.  Total: 4 NIHSS  Past Medical History      Past Medical History:  Diagnosis Date  . Adenomatous colon polyp 2009  . Allergy   . Carotid artery occlusion   . Diverticulosis of colon (without mention of hemorrhage)   . ED (erectile dysfunction)   . Esophageal stricture   . Esophagitis   . GERD (gastroesophageal reflux disease)   . Goals of care, counseling/discussion 12/23/2016  . Hiatal hernia   . Hypertension   . Prostate cancer ([place])   . Unspecified gastritis and gastroduodenitis without mention of hemorrhage     Family History  family history includes Breast cancer in his sister; Cancer in his mother; Heart disease in his father; Hyperlipidemia in his daughter; Hypertension in his daughter and son.  Prior Rehab/Hospitalizations:  Has the patient had major surgery during 100  days prior to admission? No  Current Medications   Current Facility-Administered Medications:  .  0.9 %  sodium chloride infusion, , Intravenous, Continuous, Thomas Doom, MD, Last Rate: 75 mL/hr at 01/09/17 0526 .  acetaminophen (TYLENOL) tablet 650 mg, 650 mg, Oral, Q4H PRN, 650 mg at 01/09/17 1205 **OR** acetaminophen (TYLENOL) solution 650 mg, 650 mg, Per Tube, Q4H PRN **OR** acetaminophen (TYLENOL) suppository 650 mg, 650 mg, Rectal, Q4H PRN, Thomas Doom, MD .  amLODipine (NORVASC) tablet 5 mg, 5 mg, Oral, Daily, Thomas Elbe, MD, 5 mg at 01/09/17 0901 .  aspirin EC tablet 325 mg, 325 mg, Oral, Daily, [name], Thomas L, PA-C, 325 mg at 01/09/17 0901 .  food thickener (THICK IT) powder, , Oral, PRN, Thomas Doom, MD .  heparin injection 5,000 Units, 5,000 Units, Subcutaneous, Q8H, Thomas Doom, MD, 5,000 Units at 01/09/17 1359 .  labetalol (NORMODYNE,TRANDATE) injection 10 mg, 10 mg, Intravenous, Q20 Min PRN, Thomas Doom, MD .  ondansetron [facility]) injection 4 mg, 4 mg, Intravenous, Q6H PRN, [name], Thomas L, PA-C, 4 mg at 01/09/17 1205 .  rosuvastatin (CRESTOR) tablet 10 mg, 10 mg, Oral, Daily, [name], Thomas L, PA-C, 10 mg at 01/09/17 0901  Patients Current Diet: DIET DYS 3 Room service appropriate? Yes; Fluid consistency: Thin  Precautions / Restrictions Precautions Precautions: Fall Restrictions Weight Bearing Restrictions: No   Has the patient had 2 or more falls or a fall with injury in the past year?No  Prior Activity Level Community (5-7x/wk): was using cane or RW at times; recent vertigo so just recently no driving. (Typically does all the grocery shopping)  [place] Devices/Equipment: Cane (specify quad or straight) Home Equipment: Walker - 2 wheels, Cane - single point, Bedside commode, Shower seat  Prior Device Use: Indicate devices/aids used by the patient prior to  current illness, exacerbation or injury? some [place] of cane or RW due to vertigo, just recently  Prior Functional Level Prior Function Level of Independence: Independent with assistive device(s) Comments: rare use of cane or RW until recent vertigo  Self Care: Did the patient need help bathing, dressing, using the  toilet or eating?  Independent  Indoor Mobility: Did the patient need assistance with walking from room to room (with or without device)? Independent  Stairs: Did the patient need assistance with internal or external stairs (with or without device)? Independent  Functional Cognition: Did the patient need help planning regular tasks such as shopping or remembering to take medications? Independent  Current Functional Level Cognition  Arousal/Alertness: Awake/alert Overall Cognitive Status: Impaired/Different from baseline Current Attention Level: Selective Orientation Level: Oriented to person, Oriented to place, Oriented to situation, Disoriented to time Following Commands: Follows one step commands consistently, Follows one step commands with increased time Safety/Judgement: Decreased awareness of safety, Decreased awareness of deficits General Comments: Pt with decreased sequencing abilities during functional mobility making maneuvering RW difficult. Attention: Sustained Sustained Attention: Impaired Sustained Attention Impairment: Verbal basic, Functional basic Memory: Impaired Memory Impairment: Storage deficit, Retrieval deficit Awareness: Appears intact Problem Solving: Impaired Problem Solving Impairment: Verbal basic    Extremity Assessment (includes Sensation/Coordination)  Upper Extremity Assessment: Defer to OT evaluation  Lower Extremity Assessment: Generalized weakness    ADLs  Overall ADL's : Needs assistance/impaired Eating/Feeding: Set up, Sitting Grooming: Minimal assistance, Standing Upper Body Bathing: Supervision/ safety, Set up,  Sitting Lower Body Bathing: Minimal assistance, Sit to/from stand Upper Body Dressing : Supervision/safety, Set up, Sitting Lower Body Dressing: Sit to/from stand, Minimal assistance Toilet Transfer: Moderate assistance, +2 for safety/equipment, Ambulation, RW, BSC Toileting- Clothing Manipulation and Hygiene: Moderate assistance, Sit to/from stand Functional mobility during ADLs: Moderate assistance, +2 for safety/equipment, Rolling walker General ADL Comments: Pt with significant ataxia during mobility limiting safety. R and forward lean requiring physical assistance to correct.     Mobility  Overal bed mobility: Needs Assistance Bed Mobility: Supine to Sit Supine to sit: Min guard General bed mobility comments: Increased time with close guard at the trunk for safety. Poor sequencing, but able to correct with time.     Transfers  Overall transfer level: Needs assistance Equipment used: Rolling walker (2 wheeled) Transfers: Sit to/from Stand Sit to Stand: Min assist General transfer comment: VC's for hand placement on seated surface for safety. Pt required assist for balance support and to recover from large LOB upon first stand.     Ambulation / Gait / Stairs / Wheelchair Mobility  Ambulation/Gait Ambulation/Gait assistance: Min assist, +2 safety/equipment Ambulation Distance (Feet): 120 Feet Assistive device: Rolling walker (2 wheeled) Gait Pattern/deviations: Ataxic, Decreased step length - right, Decreased step length - left, Step-through pattern, Drifts right/left, Trunk flexed General Gait Details: Pt with poor posture today, and noted R shoulder elevation. Was able to stop and correct but not able to maintain. Pt with several lateral losses of balance to the R side requiring assist to recover.  Gait velocity: Decreased Gait velocity interpretation: Below normal speed for age/gender    Posture / Balance Dynamic Sitting Balance Sitting balance - Comments: pt able to sit  EOB with supervision Balance Overall balance assessment: Needs assistance Sitting-balance support: No upper extremity supported, Feet supported Sitting balance-Leahy Scale: Fair Sitting balance - Comments: pt able to sit EOB with supervision Postural control: Right lateral lean, Other (comment) (forward lean) Standing balance support: No upper extremity supported Standing balance-Leahy Scale: Fair Standing balance comment: Statically pt was able to stand without UE support. When dynamic challenges were introduced, pt required mod assist for balance support.     Special needs/care consideration BiPAP/CPAP  N/a CPM  N/a Continuous Drip IV  N/a Dialysis  N/a Life Vest  N/a  Oxygen none pta Special Bed  N/a Trach Size  N/a Wound Vac n/a Skin intact                          Bowel mgmt: continent LBM 01/07/2017 Bladder mgmt: some incontinence Diabetic mgmt Hgb A1c 6.9 Decreased safety awareness Palliative Team saw pt during hospitalization for goals of care discussions and counseling regarding Hospice. Pt is DNR. Plans for palliative care services follow-up.Romona Curls, NP saw pt 01/07/2017   Previous Home Environment Living Arrangements: Spouse/significant other, Children (106 year old son lives with parents)  Lives With: Spouse, Son Available Help at Discharge: Family, Available 24 hours/day Type of Home: House Home Layout: One level Home Access: Stairs to enter Entrance Stairs-Rails: Right Entrance Stairs-Number of Steps: 2 Bathroom Shower/Tub: Public librarian, Charity fundraiser: Standard Bathroom Accessibility: Yes How Accessible: Accessible via walker [place]: No  Discharge Living Setting Plans for Discharge Living Setting: Patient's home, Lives with (comment) (wife and 74 year old son) Type of Home at Discharge: House Discharge Home Layout: One level Discharge Home Access: Stairs to enter Entrance Stairs-Rails: Right Entrance Stairs-Number of Steps:  2 Discharge Bathroom Shower/Tub: Tub/shower unit, Door Discharge Bathroom Toilet: Standard Discharge Bathroom Accessibility: Yes How Accessible: Accessible via walker Does the patient have any problems obtaining your medications?: No  Social/Family/Support Systems Patient Roles: Spouse, Parent (2 sons and 2 daughters living) Sport and exercise psychologist Information: Ivin Booty, daughter Anticipated Caregiver: children Anticipated Caregiver's Contact Information: see above Ability/Limitations of Caregiver: wife 27 years old on RW, children very involved Caregiver Availability: 24/7 Discharge Plan Discussed with Primary Caregiver: Yes Is Caregiver In Agreement with Plan?: Yes Does Caregiver/Family have Issues with Lodging/Transportation while Pt is in Rehab?: No (family stay with pt alot in hospital)  Goals/Additional Needs Patient/Family Goal for Rehab: Mod I to supervision with PT, OT, and SLP Expected length of stay: [facility] 13-17 days; pt states he will give inpt rehab one week/7 days Special Service Needs: decreased safety awareness; impulsive Pt/Family Agrees to Admission and willing to participate: Yes Program Orientation Provided & Reviewed with Pt/Caregiver Including Roles  & Responsibilities: Yes  Decrease burden of Care through IP rehab admission: n/a  Possible need for SNF placement upon discharge:not expected  Patient Condition: This patient's condition remains as documented in the consult dated 01/08/2017, in which the Rehabilitation Physician determined and documented that the patient's condition is appropriate for intensive rehabilitative care in an inpatient rehabilitation facility. Will admit to inpatient rehab today.  Preadmission Screen Completed By:  Cleatrice Burke, 01/09/2017 5:11 PM ______________________________________________________________________   Discussed status with Dr. Naaman Plummer on 01/09/2017 at  1711 and received telephone approval for admission today.  Admission  Coordinator:  Cleatrice Burke, time 2919 Date 01/09/2017       Cosigned by: Meredith Staggers, MD at 01/09/2017 5:29 PM  Revision History

## 2017-01-09 NOTE — PMR Pre-admission (Signed)
PMR Admission Coordinator Pre-Admission Assessment  Patient: Thomas [name] is an 83 y.o., male MRN: [id number] DOB: Aug 16, 1925 Height: 5\' 11"  (180.3 cm) Weight: 72.6 kg (160 lb 0.9 oz)              Insurance Information HMO: yes    PPO:      PCP:      IPA:      80/20:      OTHER: medicare advantage plan PRIMARY: Blue Medicare      Policy#: [id number]      Subscriber: pt CM Name: [name].     Phone#: [phone]     Fax#: [phone] Pre-Cert#: TBA approved for 7 days      Employer: retired Benefits:  Phone #: [phone]     Name: 01/09/2017 Eff. Date: 08/14/16     Deduct: none      Out of Pocket Max: $5500      Life Max: none CIR: $300 co pay per day days 1-6 then covers 100%      SNF: no co pay days 1-20; $167.50 co pay per day days 21-60: no co pay days 61-100 Outpatient: $40 co pay per visit     Co-Pay: visits per medical neccesity Home Health: 100%      Co-Pay: visits per medical neccesity DME: 80%     Co-Pay: 20% Providers: in network  SECONDARY: none      Medicaid Application Date:       Case Manager:  Disability Application Date:       Case Worker:   Emergency Facilities manager Information    Name Relation Home Work Mobile   [name],[name] Daughter [phone]     Crom,Tyreese Carolin Coy   [phone]   Demetres, Prochnow Spouse [phone]       Current Medical History  Patient Admitting Diagnosis: right cerebellar and pontine infarcts  History of Present Illness:HPI: Thomas Johnsonis a 83 y.o.malewith history of HTN, esophageal stricture, PAF, recurrent left pleural effusion, malignant mesothelioma (less than 6 months life expectancy), 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 History taken from chart review and family. CTA head/neck showed acute to subacute right cerebellar infarct with interval occlusion of right V3 and V4 segments with intermittent tenuous blood flow in basilar and bilateral PCA  and severe atherosclerosis of bilateral cervical carotids with left mid ICA intimal flap causing 50% stenosis. Eliquis changed to ASA due to size of stroke with recommendations to resume in two weeks. He was made NPO due to severe aspiration risk and MBS done revealing delayed swallow with sensed aspiration of thins--dysphagia 3, nectar recommended. MRI brain reviewed, showing right cerebellar and pontine infarct. Per report, acute right cerebellar and pontine infarcts with petechial hemorrhage and stable old bilateral occipital and left frontal/parietal infarcts. Diet advanced to thin liquids today. Patient with resultant balance deficits with ataxia.  Total: 4 NIHSS  Past Medical History  Past Medical History:  Diagnosis Date  . Adenomatous colon polyp 2009  . Allergy   . Carotid artery occlusion   . Diverticulosis of colon (without mention of hemorrhage)   . ED (erectile dysfunction)   . Esophageal stricture   . Esophagitis   . GERD (gastroesophageal reflux disease)   . Goals of care, counseling/discussion 12/23/2016  . Hiatal hernia   . Hypertension   . Prostate cancer ([place])   . Unspecified gastritis and gastroduodenitis without mention of hemorrhage     Family History  family history includes  Breast cancer in his sister; Cancer in his mother; Heart disease in his father; Hyperlipidemia in his daughter; Hypertension in his daughter and son.  Prior Rehab/Hospitalizations:  Has the patient had major surgery during 100 days prior to admission? No  Current Medications   Current Facility-Administered Medications:  .  0.9 %  sodium chloride infusion, , Intravenous, Continuous, Greta Doom, MD, Last Rate: 75 mL/hr at 01/09/17 0526 .  acetaminophen (TYLENOL) tablet 650 mg, 650 mg, Oral, Q4H PRN, 650 mg at 01/09/17 1205 **OR** acetaminophen (TYLENOL) solution 650 mg, 650 mg, Per Tube, Q4H PRN **OR** acetaminophen (TYLENOL) suppository 650 mg, 650 mg, Rectal, Q4H PRN,  Greta Doom, MD .  amLODipine (NORVASC) tablet 5 mg, 5 mg, Oral, Daily, Kerney Elbe, MD, 5 mg at 01/09/17 0901 .  aspirin EC tablet 325 mg, 325 mg, Oral, Daily, Rinehuls, David L, PA-C, 325 mg at 01/09/17 0901 .  food thickener (THICK IT) powder, , Oral, PRN, Greta Doom, MD .  heparin injection 5,000 Units, 5,000 Units, Subcutaneous, Q8H, Greta Doom, MD, 5,000 Units at 01/09/17 1359 .  labetalol (NORMODYNE,TRANDATE) injection 10 mg, 10 mg, Intravenous, Q20 Min PRN, Greta Doom, MD .  ondansetron [facility]) injection 4 mg, 4 mg, Intravenous, Q6H PRN, Rinehuls, David L, PA-C, 4 mg at 01/09/17 1205 .  rosuvastatin (CRESTOR) tablet 10 mg, 10 mg, Oral, Daily, Rinehuls, David L, PA-C, 10 mg at 01/09/17 0901  Patients Current Diet: DIET DYS 3 Room service appropriate? Yes; Fluid consistency: Thin  Precautions / Restrictions Precautions Precautions: Fall Restrictions Weight Bearing Restrictions: No   Has the patient had 2 or more falls or a fall with injury in the past year?No  Prior Activity Level Community (5-7x/wk): was using cane or RW at times; recent vertigo so just recently no driving. (Typically does all the grocery shopping)  [place] [employer] Devices/Equipment: Cane (specify quad or straight) Home Equipment: Walker - 2 wheels, Cane - single point, Bedside commode, Shower seat  Prior Device Use: Indicate devices/aids used by the patient prior to current illness, exacerbation or injury? some [place] of cane or RW due to vertigo, just recently  Prior Functional Level Prior Function Level of Independence: Independent with assistive device(s) Comments: rare use of cane or RW until recent vertigo  Self Care: Did the patient need help bathing, dressing, using the toilet or eating?  Independent  Indoor Mobility: Did the patient need assistance with walking from room to room (with or without device)?  Independent  Stairs: Did the patient need assistance with internal or external stairs (with or without device)? Independent  Functional Cognition: Did the patient need help planning regular tasks such as shopping or remembering to take medications? Independent  Current Functional Level Cognition  Arousal/Alertness: Awake/alert Overall Cognitive Status: Impaired/Different from baseline Current Attention Level: Selective Orientation Level: Oriented to person, Oriented to place, Oriented to situation, Disoriented to time Following Commands: Follows one step commands consistently, Follows one step commands with increased time Safety/Judgement: Decreased awareness of safety, Decreased awareness of deficits General Comments: Pt with decreased sequencing abilities during functional mobility making maneuvering RW difficult. Attention: Sustained Sustained Attention: Impaired Sustained Attention Impairment: Verbal basic, Functional basic Memory: Impaired Memory Impairment: Storage deficit, Retrieval deficit Awareness: Appears intact Problem Solving: Impaired Problem Solving Impairment: Verbal basic    Extremity Assessment (includes Sensation/Coordination)  Upper Extremity Assessment: Defer to OT evaluation  Lower Extremity Assessment: Generalized weakness    ADLs  Overall ADL's :  Needs assistance/impaired Eating/Feeding: Set up, Sitting Grooming: Minimal assistance, Standing Upper Body Bathing: Supervision/ safety, Set up, Sitting Lower Body Bathing: Minimal assistance, Sit to/from stand Upper Body Dressing : Supervision/safety, Set up, Sitting Lower Body Dressing: Sit to/from stand, Minimal assistance Toilet Transfer: Moderate assistance, +2 for safety/equipment, Ambulation, RW, BSC Toileting- Clothing Manipulation and Hygiene: Moderate assistance, Sit to/from stand Functional mobility during ADLs: Moderate assistance, +2 for safety/equipment, Rolling walker General ADL Comments: Pt  with significant ataxia during mobility limiting safety. R and forward lean requiring physical assistance to correct.     Mobility  Overal bed mobility: Needs Assistance Bed Mobility: Supine to Sit Supine to sit: Min guard General bed mobility comments: Increased time with close guard at the trunk for safety. Poor sequencing, but able to correct with time.     Transfers  Overall transfer level: Needs assistance Equipment used: Rolling walker (2 wheeled) Transfers: Sit to/from Stand Sit to Stand: Min assist General transfer comment: VC's for hand placement on seated surface for safety. Pt required assist for balance support and to recover from large LOB upon first stand.     Ambulation / Gait / Stairs / Wheelchair Mobility  Ambulation/Gait Ambulation/Gait assistance: Min assist, +2 safety/equipment Ambulation Distance (Feet): 120 Feet Assistive device: Rolling walker (2 wheeled) Gait Pattern/deviations: Ataxic, Decreased step length - right, Decreased step length - left, Step-through pattern, Drifts right/left, Trunk flexed General Gait Details: Pt with poor posture today, and noted R shoulder elevation. Was able to stop and correct but not able to maintain. Pt with several lateral losses of balance to the R side requiring assist to recover.  Gait velocity: Decreased Gait velocity interpretation: Below normal speed for age/gender    Posture / Balance Dynamic Sitting Balance Sitting balance - Comments: pt able to sit EOB with supervision Balance Overall balance assessment: Needs assistance Sitting-balance support: No upper extremity supported, Feet supported Sitting balance-Leahy Scale: Fair Sitting balance - Comments: pt able to sit EOB with supervision Postural control: Right lateral lean, Other (comment) (forward lean) Standing balance support: No upper extremity supported Standing balance-Leahy Scale: Fair Standing balance comment: Statically pt was able to stand without UE  support. When dynamic challenges were introduced, pt required mod assist for balance support.     Special needs/care consideration BiPAP/CPAP  N/a CPM  N/a Continuous Drip IV  N/a Dialysis  N/a Life Vest  N/a Oxygen none pta Special Bed  N/a Trach Size  N/a Wound Vac n/a Skin intact                          Bowel mgmt: continent LBM 01/07/2017 Bladder mgmt: some incontinence Diabetic mgmt Hgb A1c 6.9 Decreased safety awareness Palliative Team saw pt during hospitalization for goals of care discussions and counseling regarding Hospice. Pt is DNR. Plans for palliative care services follow-up.Romona Curls, NP saw pt 01/07/2017   Previous Home Environment Living Arrangements: Spouse/significant other, Children (23 year old son lives with parents)  Lives With: Spouse, Son Available Help at Discharge: Family, Available 24 hours/day Type of Home: House Home Layout: One level Home Access: Stairs to enter Entrance Stairs-Rails: Right Entrance Stairs-Number of Steps: 2 Bathroom Shower/Tub: Public librarian, Door [employer] Toilet: Standard Bathroom Accessibility: Yes How Accessible: Accessible via walker [place]: No  Discharge Living Setting Plans for Discharge Living Setting: Patient's home, Lives with (comment) (wife and 72 year old son) Type of Home at Discharge: House Discharge Home Layout: One level Discharge  Home Access: Stairs to enter Entrance Stairs-Rails: Right Entrance Stairs-Number of Steps: 2 Discharge Bathroom Shower/Tub: Tub/shower unit, Door Discharge Bathroom Toilet: Standard Discharge Bathroom Accessibility: Yes How Accessible: Accessible via walker Does the patient have any problems obtaining your medications?: No  Social/Family/Support Systems Patient Roles: Spouse, Parent (2 sons and 2 daughters living) Sport and exercise psychologist Information: Ivin Booty, daughter Anticipated Caregiver: children Anticipated Caregiver's Contact Information: see above Ability/Limitations of  Caregiver: wife 63 years old on RW, children very involved Caregiver Availability: 24/7 Discharge Plan Discussed with Primary Caregiver: Yes Is Caregiver In Agreement with Plan?: Yes Does Caregiver/Family have Issues with Lodging/Transportation while Pt is in Rehab?: No (family stay with pt alot in hospital)  Goals/Additional Needs Patient/Family Goal for Rehab: Mod I to supervision with PT, OT, and SLP Expected length of stay: [facility] 13-17 days; pt states he will give inpt rehab one week/7 days Special Service Needs: decreased safety awareness; impulsive Pt/Family Agrees to Admission and willing to participate: Yes Program Orientation Provided & Reviewed with Pt/Caregiver Including Roles  & Responsibilities: Yes  Decrease burden of Care through IP rehab admission: n/a  Possible need for SNF placement upon discharge:not expected  Patient Condition: This patient's condition remains as documented in the consult dated 01/08/2017, in which the Rehabilitation Physician determined and documented that the patient's condition is appropriate for intensive rehabilitative care in an inpatient rehabilitation facility. Will admit to inpatient rehab today.  Preadmission Screen Completed By:  Cleatrice Burke, 01/09/2017 5:11 PM ______________________________________________________________________   Discussed status with Dr. Naaman Plummer on 01/09/2017 at  1711 and received telephone approval for admission today.  Admission Coordinator:  Cleatrice Burke, time 2505 Date 01/09/2017

## 2017-01-10 ENCOUNTER — Inpatient Hospital Stay (HOSPITAL_COMMUNITY): Payer: Medicare Other | Admitting: Physical Therapy

## 2017-01-10 ENCOUNTER — Inpatient Hospital Stay (HOSPITAL_COMMUNITY): Payer: Medicare Other | Admitting: Speech Pathology

## 2017-01-10 ENCOUNTER — Inpatient Hospital Stay (HOSPITAL_COMMUNITY): Payer: Medicare Other | Admitting: Occupational Therapy

## 2017-01-10 DIAGNOSIS — I10 Essential (primary) hypertension: Secondary | ICD-10-CM

## 2017-01-10 LAB — CBC WITH DIFFERENTIAL/PLATELET
Basophils Absolute: 0 10*3/uL (ref 0.0–0.1)
Basophils Relative: 0 %
EOS ABS: 0.1 10*3/uL (ref 0.0–0.7)
Eosinophils Relative: 2 %
HCT: 40 % (ref 39.0–52.0)
Hemoglobin: 12.6 g/dL — ABNORMAL LOW (ref 13.0–17.0)
LYMPHS ABS: 0.8 10*3/uL (ref 0.7–4.0)
Lymphocytes Relative: 16 %
MCH: 27.9 pg (ref 26.0–34.0)
MCHC: 31.5 g/dL (ref 30.0–36.0)
MCV: 88.5 fL (ref 78.0–100.0)
MONO ABS: 0.9 10*3/uL (ref 0.1–1.0)
MONOS PCT: 18 %
Neutro Abs: 3.1 10*3/uL (ref 1.7–7.7)
Neutrophils Relative %: 64 %
Platelets: 283 10*3/uL (ref 150–400)
RBC: 4.52 MIL/uL (ref 4.22–5.81)
RDW: 15.7 % — AB (ref 11.5–15.5)
WBC: 4.8 10*3/uL (ref 4.0–10.5)

## 2017-01-10 LAB — COMPREHENSIVE METABOLIC PANEL
ALT: 18 U/L (ref 17–63)
ANION GAP: 10 (ref 5–15)
AST: 16 U/L (ref 15–41)
Albumin: 3.2 g/dL — ABNORMAL LOW (ref 3.5–5.0)
Alkaline Phosphatase: 79 U/L (ref 38–126)
BILIRUBIN TOTAL: 0.8 mg/dL (ref 0.3–1.2)
BUN: 10 mg/dL (ref 6–20)
CALCIUM: 9.1 mg/dL (ref 8.9–10.3)
CO2: 26 mmol/L (ref 22–32)
Chloride: 100 mmol/L — ABNORMAL LOW (ref 101–111)
Creatinine, Ser: 1.23 mg/dL (ref 0.61–1.24)
GFR calc non Af Amer: 50 mL/min — ABNORMAL LOW (ref >60)
GFR, EST AFRICAN AMERICAN: 58 mL/min — AB (ref >60)
Glucose, Bld: 114 mg/dL — ABNORMAL HIGH (ref 65–99)
POTASSIUM: 3.8 mmol/L (ref 3.5–5.1)
SODIUM: 136 mmol/L (ref 135–145)
TOTAL PROTEIN: 7.2 g/dL (ref 6.5–8.1)

## 2017-01-10 MED ORDER — AMLODIPINE BESYLATE 5 MG PO TABS
5.0000 mg | ORAL_TABLET | Freq: Once | ORAL | Status: AC
Start: 2017-01-10 — End: 2017-01-10
  Administered 2017-01-10: 5 mg via ORAL
  Filled 2017-01-10: qty 1

## 2017-01-10 MED ORDER — AMLODIPINE BESYLATE 10 MG PO TABS
10.0000 mg | ORAL_TABLET | Freq: Every day | ORAL | Status: DC
  Administered 2017-01-11 – 2017-01-18 (×8): 10 mg via ORAL
  Filled 2017-01-10 (×8): qty 1

## 2017-01-10 NOTE — Evaluation (Addendum)
Occupational Therapy Assessment and Plan  Patient Details  Name: Thomas [name] MRN: [id number] Date of Birth: May 17, 1926  OT Diagnosis: cognitive deficits and muscle weakness (generalized) Rehab Potential: Rehab Potential (ACUTE ONLY): Good ELOS: 10-12 days   Today's Date: 01/10/2017 OT Individual Time: 1100-1155 OT Individual Time Calculation (min): 55 min     Problem List:  Patient Active Problem List   Diagnosis Date Noted  . Stroke due to embolism of right posterior cerebral artery ([place]) 01/09/2017  . Ataxia   . Benign essential HTN   . Esophageal stricture   . PAF (paroxysmal atrial fibrillation) ([place])   . Recurrent left pleural effusion   . Dysphagia, post-stroke   . Type 2 diabetes mellitus with circulatory disorder ([place])   . Acute blood loss anemia   . Mesothelioma of left lung ([place])   . Palliative care by specialist   . Stroke due to embolism of posterior inferior cerebral artery ([place]) d/t AF 01/05/2017  . Goals of care, counseling/discussion 12/23/2016  . Encounter for antineoplastic chemotherapy 12/23/2016  . Vertigo following cerebrovascular accident 12/16/2016  . Hyperkalemia 12/16/2016  . Type 2 diabetes mellitus with hyperlipidemia ([place]) 12/16/2016  . Insomnia 12/07/2016  . Atelectasis 10/30/2016  . Alcohol abuse 08/04/2016  . Permanent atrial fibrillation ([place]) 07/21/2016  . Malignant mesothelioma of pleura ([place]) 02/16/2016  . Perforation of right tympanic membrane 11/24/2013  . Carotid stenosis 02/21/2013  . TIA (transient ischemic attack) 02/17/2013  . Hypokalemia 11/26/2012  . GERD (gastroesophageal reflux disease) 05/17/2011  . Dysphagia 05/17/2011  . Stricture and stenosis of esophagus 08/29/2010  . Allergic rhinitis 01/16/2008  . 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 PROSTATE CANCER, HX OF 03/26/2007  . Hyperlipidemia 03/20/2007  . Essential hypertension 03/20/2007    Past Medical History:  Past Medical History:  Diagnosis Date  .  Adenomatous colon polyp 2009  . Allergy   . Carotid artery occlusion   . Diverticulosis of colon (without mention of hemorrhage)   . ED (erectile dysfunction)   . Esophageal stricture   . Esophagitis   . GERD (gastroesophageal reflux disease)   . Goals of care, counseling/discussion 12/23/2016  . Hiatal hernia   . Hypertension   . Prostate cancer ([place])   . Unspecified gastritis and gastroduodenitis without mention of hemorrhage    Past Surgical History:  Past Surgical History:  Procedure Laterality Date  . ENDARTERECTOMY Left 03/05/2013   Procedure: ENDARTERECTOMY CAROTID;  Surgeon: Conrad [place], MD;  Location: [facility];  Service: Vascular;  Laterality: Left;  . ESOPHAGOGASTRODUODENOSCOPY (EGD) WITH ESOPHAGEAL DILATION    . PROSTATE SURGERY     Laser surgery    Assessment & Plan Clinical Impression: Thomas [name] a 83 y.o.malewith history of HTN, esophageal stricture, PAF, recurrent left pleural effusion, malignant mesothelioma (less than 6 moths life expectancy), 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 History taken from chart review and family. CTA head/neck showed acute to subacute right cerebellar infarct with interval occlusion of right V3 and V4 segments with intermittent tenuous blood flow in basilar and bilateral PCA and severe atherosclerosis of bilateral cervical carotids with left mid ICA intimal flap causing 50% stenosis. MRI brain reviewed, showing acute right cerebellar and pontine infarcts with petechial hemorrhage and stable old bilateral occipital and left frontal/parietal infarcts. Eliquis changed to ASA due to size of stroke and concerns of hemorrhagic conversion with recommendations to resume in two weeks. MBS done revealing delayed swallow with sensed aspiration of  thins and diet has been advanced to dysphagia 3, thin liquids. ~Patient with resultant balance deficits with ataxia and CIR recommended  for follow up therapy Patient transferred to CIR on 01/09/2017 .    Patient currently requires mod with basic self-care skills secondary to muscle weakness, decreased cardiorespiratoy endurance, decreased problem solving, decreased safety awareness, decreased memory and delayed processing and decreased standing balance, decreased postural control and decreased balance strategies.  Prior to hospitalization, patient could complete ADLs/IADLs with modified independent .  Patient will benefit from skilled intervention to decrease level of assist with basic self-care skills and increase independence with basic self-care skills prior to discharge home with care partner.  Anticipate patient will require 24 hour supervision and follow up home health.  OT - End of Session Activity Tolerance: Tolerates < 10 min activity with changes in vital signs Endurance Deficit: Yes Endurance Deficit Description: Limited eval due to high BP at rest and with activity OT Assessment Rehab Potential (ACUTE ONLY): Good OT Patient demonstrates impairments in the following area(s): Endurance;Safety;Cognition;Balance OT Basic ADL's Functional Problem(s): Grooming;Bathing;Dressing;Toileting OT Transfers Functional Problem(s): Toilet;Tub/Shower OT Additional Impairment(s): None OT Plan OT Intensity: Minimum of 1-2 x/day, 45 to 90 minutes OT Frequency: 5 out of 7 days OT Duration/Estimated Length of Stay: 7-10 days OT Treatment/Interventions: Balance/vestibular training;Cognitive remediation/compensation;Discharge planning;Community reintegration;DME/adaptive equipment instruction;Functional mobility training;Neuromuscular re-education;Psychosocial support;Patient/family education;Self Care/advanced ADL retraining;Therapeutic Activities;Therapeutic Exercise;UE/LE Strength taining/ROM;UE/LE Coordination activities OT Self Feeding Anticipated Outcome(s): Mod I OT Basic Self-Care Anticipated Outcome(s): Supervision OT Toileting  Anticipated Outcome(s): Supervision OT Bathroom Transfers Anticipated Outcome(s): Supervision OT Recommendation Recommendations for Other Services: Neuropsych consult;Therapeutic Recreation consult Patient destination: Home Follow Up Recommendations: Home health OT Equipment Recommended: To be determined   Skilled Therapeutic Intervention Pt seen for OT Eval and ADL bathing/dressing session. Pt in supine upon arrival, agreeable to tx session. BP monitored throughout tx session, see below for details. Limited eval due to pt's high BP, RN was made aware and medication administered. He transferred to EOB and completed bathing/dressing from EOB. Pt asymptomatic with BP. Stood with hand held assist to complete LB dressing tasks and for stand pivot to recliner.  Pt left seated in recliner at end of session, all needs in reach, made aware of use of call bell for mobility and needs. Educated throughout session regarding role of OT, POC, OT goals, IPR, and d/c planning.   BP Readings this session:  Supine 161/97 Sitting EOB: 165/117 Sitting EOB following 10 minutes: 187/103 Standing 171/112 Sitting in recliner 168/107  OT Evaluation Precautions/Restrictions  Precautions Precautions: Fall Restrictions Weight Bearing Restrictions: No General Chart Reviewed: Yes Vital Signs Therapy Vitals BP: (!) 171/112 (PPRN given) Home Living/Prior Functioning Home Living Family/patient expects to be discharged to:: Private residence Living Arrangements: Spouse/significant other, Children Available Help at Discharge: Family, Available 24 hours/day Type of Home: House Home Access: Stairs to enter [employer] of Steps: 2 Entrance Stairs-Rails: Right Home Layout: One level Bathroom Shower/Tub: Public librarian, Armed forces training and education officer: Yes  Lives With: Spouse, Son IADL History Current License: Yes Mode of TransportationOccupational psychologist Education: 12th  Occupation:  Retired Leisure and Hobbies: Likes to play and Arts development officer Prior Function Level of Independence: Independent with basic ADLs, Independent with homemaking with ambulation, Independent with gait, Independent with transfers  Able to Take Stairs?: Yes Driving: Yes Vocation: Retired Comments: rare use of cane or RW until recent vertigo Vision Baseline Vision/History: Wears glasses Wears Glasses: Reading only Patient Visual Report: No change from baseline Vision  Assessment?: No apparent visual deficits Eye Alignment: Within Functional Limits Ocular Range of Motion: Within Functional Limits Alignment/Gaze Preference: Within Defined Limits Visual Fields: No apparent deficits Perception  Perception: Within Functional Limits Praxis Praxis: Intact Cognition Overall Cognitive Status: Impaired/Different from baseline Arousal/Alertness: Awake/alert Orientation Level: Person;Place;Situation Person: Oriented Place: Oriented Situation: Oriented Year: Other (Comment) (2011) Month: August Day of Week: Correct Memory: Impaired Memory Impairment: Storage deficit;Retrieval deficit;Decreased recall of new information Immediate Memory Recall:  (Unable to clearly verbalize words due to dysarthria despite cuing and repetition from therapist. ) Attention: Sustained Sustained Attention: Impaired Sustained Attention Impairment: Verbal basic;Functional basic Awareness: Appears intact Problem Solving: Impaired Problem Solving Impairment: Verbal basic;Functional basic Executive Function:  (All impaired due to lower deficits) Safety/Judgment: Appears intact Sensation Sensation Light Touch: Appears Intact Stereognosis: Appears Intact Proprioception: Appears Intact Coordination Gross Motor Movements are Fluid and Coordinated: Yes Fine Motor Movements are Fluid and Coordinated: Yes Finger Nose Finger Test: [facility] Motor  Motor Motor: Other (comment) Motor - Skilled Clinical Observations: Generalized  weakness Trunk/Postural Assessment  Cervical Assessment Cervical Assessment: Exceptions to [facility] (Forward head) Thoracic Assessment Thoracic Assessment: Exceptions to [facility] (Kyphotic) Lumbar Assessment Lumbar Assessment: Exceptions to [facility] (Posterior pelvic tilt) Postural Control Postural Control: Deficits on evaluation  Balance Balance Balance Assessed: Yes Dynamic Sitting Balance Dynamic Sitting - Balance Support: During functional activity;Feet supported Dynamic Sitting - Level of Assistance: 5: Stand by assistance;4: Min assist Sitting balance - Comments: Sitting EOB to complete bathing/dressing Static Standing Balance Static Standing - Balance Support: Right upper extremity supported;Left upper extremity supported;During functional activity Static Standing - Level of Assistance: 4: Min assist;3: Mod assist Static Standing - Comment/# of Minutes: Standing for LB dressing Dynamic Standing Balance Dynamic Standing - Balance Support: During functional activity;Right upper extremity supported;Left upper extremity supported Dynamic Standing - Level of Assistance: 4: Min assist;3: Mod assist Extremity/Trunk Assessment RUE Assessment RUE Assessment: Within Functional Limits LUE Assessment LUE Assessment: Within Functional Limits   See Function Navigator for Current Functional Status.   Refer to Care Plan for Long Term Goals  Recommendations for other services: Neuropsych and Therapeutic Recreation  Pet therapy and Stress management   Discharge Criteria: Patient will be discharged from OT if patient refuses treatment 3 consecutive times without medical reason, if treatment goals not met, if there is a change in medical status, if patient makes no progress towards goals or if patient is discharged from hospital.  The above assessment, treatment plan, treatment alternatives and goals were discussed and mutually agreed upon: by patient  Ernestina Patches 01/10/2017, 12:47 PM

## 2017-01-10 NOTE — Progress Notes (Signed)
[facility] RE[facility]IL[facility]     PROGRESS NOTE    Subjective/Complaints: No issues. Happy to be on rehab. Waiting for first therapy  ROS: pt denies nausea, vomiting, diarrhea, cough, shortness of breath or chest pain   Objective: Vital Signs: Blood pressure (!) 171/112, pulse 72, temperature 97.7 F (36.5 C), temperature source Oral, resp. rate 18, height 5\' 11"  (1.803 m), weight 74.2 kg (163 lb 9.6 oz), SpO2 99 %. No results found.  Recent Labs  01/10/17 0658  WBC 4.8  HGB 12.6*  HCT 40.0  PLT 283    Recent Labs  01/10/17 0658  NA 136  K 3.8  CL 100*  GLUCOSE 114*  BUN 10  CREATININE 1.23  CALCIUM 9.1   CBG (last 3)  No results for input(s): GLUCAP in the last 72 hours.  Wt Readings from Last 3 Encounters:  01/10/17 74.2 kg (163 lb 9.6 oz)  01/05/17 72.6 kg (160 lb 0.9 oz)  12/22/16 75.3 kg (166 lb)    Physical Exam:  Constitutional: He is oriented to person, place, and time. He appears well-developedand well-nourished.  HENT:  Head: Normocephalicand atraumatic.  Mouth/Throat: Oropharynx is clear and moist.  Eyes: Conjunctivaeand EOMare normal. Pupils are equal, round, and reactive to light.  Neck: Normal range of motion. Neck supple.  Cardiovascular: RRR.  Respiratory: normal effort. Lungs with decreased air movement GI: Soft. Bowel sounds are normal. He exhibits no distension. There is no tenderness.  Musculoskeletal: He exhibits no edemaor tenderness.  Neurological: He is alertand oriented to person, place, and time.  Dysarthric but intelligible  Able to follow simple commands without difficulty. Dysmetria with right finger to nose more than left. .  Sensory deficits RUE and RLE present.  Motor: 4+/5 throughout bilateral UE and LE's  Skin: Skin is warmand dry. No rashnoted. No erythema.  Psychiatric: He has a normal mood and affect. His behavior is normal. Thought contentnormal.   Assessment/Plan: 1. Right sided  ataxia, balance and cognitive/swallowing deficits secondary to right cerebellar and pontine infarcts which require 3+ hours per day of interdisciplinary therapy in a comprehensive inpatient rehab setting. Physiatrist is providing close team supervision and 24 hour management of active medical problems listed below. Physiatrist and rehab team continue to assess barriers to discharge/monitor patient progress toward functional and medical goals.  Function:  Bathing Bathing position      Bathing parts      Bathing assist        Upper Body Dressing/Undressing Upper body dressing                    Upper body assist        Lower Body Dressing/Undressing Lower body dressing                                  Lower body assist        Toileting Toileting Toileting activity did not occur: Safety/medical concerns        Toileting assist     Transfers Chair/bed transfer Chair/bed transfer activity did not occur: Safety/medical concerns           Manufacturing systems engineer          Cognition Comprehension Comprehension assist level: Understands basic 75 - 89% of the time/ requires cueing 10 - 24% of the time  Expression Expression assist  level: Expresses basic 90% of the time/requires cueing < 10% of the time.  Social Interaction Social Interaction assist level: Interacts appropriately with others - No medications needed.  Problem Solving Problem solving assist level: Solves basic 50 - 74% of the time/requires cueing 25 - 49% of the time  Memory Memory assist level: Recognizes or recalls 25 - 49% of the time/requires cueing 50 - 75% of the time   Medical Problem List and Plan: 1. Balance, swallowing, communication and functional deficitssecondary to right cerebellar and pontine infarcts -beginning therapies today 2. DVT Prophylaxis/Anticoagulation: Pharmaceutical: Heparin 3. Pain Management: N/A 4. Mood: Has a positive  outlook and supportive family. LCSW to follow for evaluation and support.  5. Neuropsych: This patient iscapable of making decisions on hisown behalf. 6. Skin/Wound Care: routine pressure relief measures.  7. Fluids/Electrolytes/Nutrition: Monitor I/O.  Marland Kitchen Labs reasonable  -encourage PO 8. HTN: Monitor BP bid. BP remains elevated  -increase norvasc to 10mg  daily  -prn clonidine 9. Malignant mesothelioma with recurrent pleural effusion: Lifespan of less than 6 months.  10. A fib: Off Eliquis--to be resumed  11. Hypokalemia: resolved with supplement.  12. T2DM with Hgb A1C-6.9: Fasting/random BS have ranged from 110 -130.     -dietary education 13. Dyslipidemia: On Crestor.    LOS (Days) 1 A Hyde T, MD 01/10/2017 12:29 PM

## 2017-01-10 NOTE — Evaluation (Signed)
Physical Therapy Assessment and Plan  Patient Details  Name: Thomas [name] MRN: [id number] Date of Birth: Mar 14, 1926  PT Diagnosis: Abnormality of gait, Ataxia, Coordination disorder and Hemiplegia non-dominant Rehab Potential: Good ELOS: 10-14 days    Today's Date: 01/10/2017 PT Individual Time: 1300-1415 PT Individual Time Calculation (min): 75 min    Problem List:  Patient Active Problem List   Diagnosis Date Noted  . Stroke due to embolism of right posterior cerebral artery ([place]) 01/09/2017  . Ataxia   . Benign essential HTN   . Esophageal stricture   . PAF (paroxysmal atrial fibrillation) ([place])   . Recurrent left pleural effusion   . Dysphagia, post-stroke   . Type 2 diabetes mellitus with circulatory disorder ([place])   . Acute blood loss anemia   . Mesothelioma of left lung ([place])   . Palliative care by specialist   . Stroke due to embolism of posterior inferior cerebral artery ([place]) d/t AF 01/05/2017  . Goals of care, counseling/discussion 12/23/2016  . Encounter for antineoplastic chemotherapy 12/23/2016  . Vertigo following cerebrovascular accident 12/16/2016  . Hyperkalemia 12/16/2016  . Type 2 diabetes mellitus with hyperlipidemia ([place]) 12/16/2016  . Insomnia 12/07/2016  . Atelectasis 10/30/2016  . Alcohol abuse 08/04/2016  . Permanent atrial fibrillation ([place]) 07/21/2016  . Malignant mesothelioma of pleura ([place]) 02/16/2016  . Perforation of right tympanic membrane 11/24/2013  . Carotid stenosis 02/21/2013  . TIA (transient ischemic attack) 02/17/2013  . Hypokalemia 11/26/2012  . GERD (gastroesophageal reflux disease) 05/17/2011  . Dysphagia 05/17/2011  . Stricture and stenosis of esophagus 08/29/2010  . Allergic rhinitis 01/16/2008  . 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 PROSTATE CANCER, HX OF 03/26/2007  . Hyperlipidemia 03/20/2007  . Essential hypertension 03/20/2007    Past Medical History:  Past Medical History:  Diagnosis Date  . Adenomatous  colon polyp 2009  . Allergy   . Carotid artery occlusion   . Diverticulosis of colon (without mention of hemorrhage)   . ED (erectile dysfunction)   . Esophageal stricture   . Esophagitis   . GERD (gastroesophageal reflux disease)   . Goals of care, counseling/discussion 12/23/2016  . Hiatal hernia   . Hypertension   . Prostate cancer ([place])   . Unspecified gastritis and gastroduodenitis without mention of hemorrhage    Past Surgical History:  Past Surgical History:  Procedure Laterality Date  . ENDARTERECTOMY Left 03/05/2013   Procedure: ENDARTERECTOMY CAROTID;  Surgeon: Conrad Villalba, MD;  Location: [facility];  Service: Vascular;  Laterality: Left;  . ESOPHAGOGASTRODUODENOSCOPY (EGD) WITH ESOPHAGEAL DILATION    . PROSTATE SURGERY     Laser surgery    Assessment & Plan Clinical Impression: Patient is a Thomas Johnsonis a 83 y.o.malewith history of HTN, esophageal stricture, PAF, recurrent left pleural effusion, malignant mesothelioma (less than 6 moths life expectancy), 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 History taken from chart review and family. CTA head/neck showed acute to subacute right cerebellar infarct with interval occlusion of right V3 and V4 segments with intermittent tenuous blood flow in basilar and bilateral PCA and severe atherosclerosis of bilateral cervical carotids with left mid ICA intimal flap causing 50% stenosis. MRI brain reviewed, showing acute right cerebellar and pontine infarcts with petechial hemorrhage and stable old bilateral occipital and left frontal/parietal infarcts. Eliquis changed to ASA due to size of stroke and concerns of hemorrhagic conversion with recommendations to resume in two weeks. Patient transferred to CIR on 01/09/2017 .  Patient currently requires mod with mobility secondary to muscle weakness, decreased cardiorespiratoy endurance, motor apraxia, ataxia, decreased  coordination and decreased motor planning, decreased initiation, decreased attention, decreased awareness, decreased problem solving, decreased safety awareness, decreased memory and delayed processing, central origin and decreased sitting balance, decreased standing balance, decreased postural control, hemiplegia and decreased balance strategies.  Prior to hospitalization, patient was modified independent  with mobility and lived with Spouse, Son in a House home.  Home access is 2Stairs to enter.  Patient will benefit from skilled PT intervention to maximize safe functional mobility, minimize fall risk and decrease caregiver burden for planned discharge home with 24 hour supervision.  Anticipate patient will benefit from follow up [place] at discharge.  PT - End of Session Activity Tolerance: Tolerates 10 - 20 min activity with multiple rests Endurance Deficit: Yes Endurance Deficit Description: Limited eval due to high BP at rest and with activity PT Assessment Rehab Potential (ACUTE/IP ONLY): Good Barriers to Discharge: Inaccessible home environment;Decreased caregiver support PT Patient demonstrates impairments in the following area(s): Balance;Behavior;Endurance;Motor;Perception;Safety PT Transfers Functional Problem(s): Bed Mobility;Bed to Chair;Car;Furniture;Floor PT Locomotion Functional Problem(s): Ambulation;Wheelchair Mobility;Stairs PT Plan PT Intensity: Minimum of 1-2 x/day ,45 to 90 minutes PT Frequency: 5 out of 7 days PT Duration Estimated Length of Stay: 10-14 days  PT Treatment/Interventions: Ambulation/gait training;Balance/vestibular training;Cognitive remediation/compensation;Community reintegration;Discharge planning;Disease management/prevention;DME/adaptive equipment instruction;Functional electrical stimulation;Functional mobility training;Neuromuscular re-education;Patient/family education;Psychosocial support;Splinting/orthotics;Stair training;Therapeutic  Activities;Therapeutic Exercise;UE/LE Strength taining/ROM;Visual/perceptual remediation/compensation;UE/LE Coordination activities;Wheelchair propulsion/positioning PT Transfers Anticipated Outcome(s): supervision with LRAD  PT Locomotion Anticipated Outcome(s): Supervision with LRAD at household distance PT Recommendation Recommendations for Other Services: Therapeutic Recreation consult Therapeutic Recreation Interventions: Pet therapy Follow Up Recommendations: Home health PT Patient destination: Home Equipment Recommended: Wheelchair cushion (measurements);Wheelchair (measurements);Rolling walker with 5" wheels  Skilled Therapeutic Intervention PT instructed patient in PT Evaluation and initiated treatment intervention; see below for results. PT educated patient in [place], rehab potential, rehab goals, and discharge recommendations. PT assessed BP and HR throughout treatment. BP remained below 158/86 throughout treatment. As listed below pt required mod assist for stand pivot transfers and gait due to poor midline orientation. Pt demonstrated constant LOB to the R throughout treatment. Car transfer also performed with mod assist and RW from PT. Pt returned to room and performed stand pivot transfer to bed with mod assist and RW. Sit>supine completed with supervision assist and pt left supine in bed with call bell in reach and all needs met.     PT Evaluation Precautions/Restrictions Precautions Precautions: Fall Restrictions Weight Bearing Restrictions: No General   Vital SignsTherapy Vitals BP: (!) 171/112 (PPRN given) Pain   Home Living/Prior Functioning Home Living Available Help at Discharge: Family;Available 24 hours/day Type of Home: House Home Access: Stairs to enter [employer] of Steps: 2 Entrance Stairs-Rails: Right Home Layout: One level Bathroom Shower/Tub: Tub/shower unit;Door [employer] Toilet: Standard Bathroom Accessibility: Yes  Lives With:  Spouse;Son Prior Function Level of Independence: Independent with basic ADLs;Independent with homemaking with ambulation;Independent with gait;Independent with transfers  Able to Take Stairs?: Yes Driving: Yes Vocation: Retired Comments: use of cane ~ 1 year Vision/Perception  Vision - Assessment Eye Alignment: Within Functional Limits Ocular Range of Motion: Within Functional Limits Alignment/Gaze Preference: Within Defined Limits Saccades: Within functional limits Perception Perception: Within Functional Limits Praxis Praxis: Intact  Cognition Overall Cognitive Status: Impaired/Different from baseline Arousal/Alertness: Awake/alert Attention: Sustained Sustained Attention: Impaired Sustained Attention Impairment: Verbal basic;Functional basic Memory: Impaired Memory Impairment: Storage deficit;Retrieval deficit;Decreased recall of new information Awareness: Appears intact Problem  Solving: Impaired Problem Solving Impairment: Verbal basic;Functional basic Executive Function:  (All impaired due to lower deficits) Safety/Judgment: Appears intact Sensation Sensation Light Touch: Appears Intact Stereognosis: Appears Intact Proprioception: Appears Intact Coordination Gross Motor Movements are Fluid and Coordinated: Yes Fine Motor Movements are Fluid and Coordinated: Yes Finger Nose Finger Test: [facility] Heel Shin Test: [facility]  Motor  Motor Motor: Other (comment) Motor - Skilled Clinical Observations: Generalized weakness  Mobility Bed Mobility Bed Mobility: Rolling Right;Rolling Left;Supine to Sit;Sit to Supine Rolling Right: 5: Supervision Rolling Right Details: Verbal cues for sequencing Rolling Left: 5: Supervision Rolling Left Details: Verbal cues for sequencing Supine to Sit: 5: Supervision Supine to Sit Details: Verbal cues for sequencing Sit to Supine: 5: Supervision Sit to Supine - Details: Verbal cues for sequencing Transfers Transfers: Yes Sit to Stand: 4: Min  assist Sit to Stand Details: Tactile cues for weight shifting;Verbal cues for technique;Verbal cues for precautions/safety Stand to Sit: 4: Min assist Stand to Sit Details (indicate cue type and reason): Verbal cues for sequencing;Tactile cues for weight shifting Stand Pivot Transfers: 3: Mod assist Stand Pivot Transfer Details: Tactile cues for weight shifting;Verbal cues for technique;Verbal cues for precautions/safety Locomotion  Ambulation Ambulation: Yes Ambulation/Gait Assistance: 3: Mod assist Ambulation Distance (Feet): 60 Feet Assistive device: Rolling walker Ambulation/Gait Assistance Details: Verbal cues for technique;Verbal cues for precautions/safety;Tactile cues for weight shifting;Verbal cues for gait pattern;Verbal cues for safe use of DME/AE Gait Gait: Yes Gait Pattern: Lateral trunk lean to right;Decreased step length - right;Decreased stance time - left Stairs / Additional Locomotion Stairs: Yes Stairs Assistance: 3: Mod assist Stairs Assistance Details: Verbal cues for technique;Verbal cues for precautions/safety;Verbal cues for gait pattern;Tactile cues for weight shifting Stair Management Technique: Two rails Number of Stairs: 4 Height of Stairs: 6 Wheelchair Mobility Wheelchair Mobility: Yes Wheelchair Assistance: 5: Investment banker, operational Details: Verbal cues for technique;Verbal cues for precautions/safety;Verbal cues for Astronomer: Both upper extremities Wheelchair Parts Management: Needs assistance Distance: 135  Trunk/Postural Assessment  Cervical Assessment Cervical Assessment: Exceptions to [facility] (Forward head) Thoracic Assessment Thoracic Assessment: Exceptions to [facility] (Kyphotic) Lumbar Assessment Lumbar Assessment: Exceptions to [name] (Posterior pelvic tilt) Postural Control Postural Control: Deficits on evaluation (lateral lean to the R)  Balance Balance Balance Assessed: Yes Dynamic Sitting Balance Dynamic  Sitting - Balance Support: During functional activity;Feet supported Dynamic Sitting - Level of Assistance: 5: Stand by assistance;4: Min assist Sitting balance - Comments: Sitting EOB to complete bathing/dressing Static Standing Balance Static Standing - Balance Support: Right upper extremity supported;Left upper extremity supported;During functional activity Static Standing - Level of Assistance: 4: Min assist;3: Mod assist Static Standing - Comment/# of Minutes: Standing for LB dressing Dynamic Standing Balance Dynamic Standing - Balance Support: During functional activity;Right upper extremity supported;Left upper extremity supported Dynamic Standing - Level of Assistance: 4: Min assist;3: Mod assist Extremity Assessment  RUE Assessment RUE Assessment: Within Functional Limits LUE Assessment LUE Assessment: Within Functional Limits RLE Assessment RLE Assessment: Within Functional Limits LLE Assessment LLE Assessment: Exceptions to WFL LLE AROM (degrees) LLE Overall AROM Comments: WFL LLE Strength LLE Overall Strength Comments: 4/5 proximal to distal except knee extension 5/5   See Function Navigator for Current Functional Status.   Refer to Care Plan for Long Term Goals  Recommendations for other services: Therapeutic Recreation  Pet therapy  Discharge Criteria: Patient will be discharged from PT if patient refuses treatment 3 consecutive times without medical reason, if treatment goals not met, if there is a change  in medical status, if patient makes no progress towards goals or if patient is discharged from hospital.  The above assessment, treatment plan, treatment alternatives and goals were discussed and mutually agreed upon: by patient  Lorie Phenix 01/10/2017, 2:40 PM

## 2017-01-10 NOTE — Evaluation (Signed)
Speech Language Pathology Assessment and Plan  Patient Details  Name: Thomas [name] MRN: [id number] Date of Birth: 05/18/1926  SLP Diagnosis: Cognitive Impairments;Dysphagia  Rehab Potential: Good ELOS:   7 to 10 days   Today's Date: 01/10/2017 SLP Individual Time: 9244-6286 SLP Individual Time Calculation (min): 60 min   Problem List:  Patient Active Problem List   Diagnosis Date Noted  . Stroke due to embolism of right posterior cerebral artery ([place]) 01/09/2017  . Ataxia   . Benign essential HTN   . Esophageal stricture   . PAF (paroxysmal atrial fibrillation) ([place])   . Recurrent left pleural effusion   . Dysphagia, post-stroke   . Type 2 diabetes mellitus with circulatory disorder ([place])   . Acute blood loss anemia   . Mesothelioma of left lung ([place])   . Palliative care by specialist   . Stroke due to embolism of posterior inferior cerebral artery ([place]) d/t AF 01/05/2017  . Goals of care, counseling/discussion 12/23/2016  . Encounter for antineoplastic chemotherapy 12/23/2016  . Vertigo following cerebrovascular accident 12/16/2016  . Hyperkalemia 12/16/2016  . Type 2 diabetes mellitus with hyperlipidemia ([place]) 12/16/2016  . Insomnia 12/07/2016  . Atelectasis 10/30/2016  . Alcohol abuse 08/04/2016  . Permanent atrial fibrillation ([place]) 07/21/2016  . Malignant mesothelioma of pleura ([place]) 02/16/2016  . Perforation of right tympanic membrane 11/24/2013  . Carotid stenosis 02/21/2013  . TIA (transient ischemic attack) 02/17/2013  . Hypokalemia 11/26/2012  . GERD (gastroesophageal reflux disease) 05/17/2011  . Dysphagia 05/17/2011  . Stricture and stenosis of esophagus 08/29/2010  . Allergic rhinitis 01/16/2008  . 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 PROSTATE CANCER, HX OF 03/26/2007  . Hyperlipidemia 03/20/2007  . Essential hypertension 03/20/2007   Past Medical History:  Past Medical History:  Diagnosis Date  . Adenomatous colon polyp 2009  . Allergy    . Carotid artery occlusion   . Diverticulosis of colon (without mention of hemorrhage)   . ED (erectile dysfunction)   . Esophageal stricture   . Esophagitis   . GERD (gastroesophageal reflux disease)   . Goals of care, counseling/discussion 12/23/2016  . Hiatal hernia   . Hypertension   . Prostate cancer ([place])   . Unspecified gastritis and gastroduodenitis without mention of hemorrhage    Past Surgical History:  Past Surgical History:  Procedure Laterality Date  . ENDARTERECTOMY Left 03/05/2013   Procedure: ENDARTERECTOMY CAROTID;  Surgeon: Conrad [place], MD;  Location: [facility];  Service: Vascular;  Laterality: Left;  . ESOPHAGOGASTRODUODENOSCOPY (EGD) WITH ESOPHAGEAL DILATION    . PROSTATE SURGERY     Laser surgery    Assessment / Plan / Recommendation Clinical Impression Thomas [name] a 83 y.o.malewith history of HTN, esophageal stricture, PAF, recurrent left pleural effusion, malignant mesothelioma (less than 6 moths life expectancy), 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 History taken from chart review and family. CTA head/neck showed acute to subacute right cerebellar infarct with interval occlusion of right V3 and V4 segments with intermittent tenuous blood flow in basilar and bilateral PCA and severe atherosclerosis of bilateral cervical carotids with left mid ICA intimal flap causing 50% stenosis. MRI brain reviewed, showing acute right cerebellar and pontine infarcts with petechial hemorrhage and stable old bilateral occipital and left frontal/parietal infarcts. Eliquis changed to ASA due to size of stroke and concerns of hemorrhagic conversion with recommendations to resume in two weeks. MBS done revealing delayed swallow with sensed aspiration of thins and diet  has been advanced to dysphagia 3, thin liquids. ~Patient with resultant balance deficits with ataxia and CIR recommended for follow up therapy.    Pt admited to CIR on 01/09/17 with bedside swallow and cognitive linguistic evaluations completed on 01/10/17. Pt presents with mild oropharyngeal dysphagia complicated by continual hiccups. Pt consumed dysphagia 3 breakfast tray with thin liquid sand medicien whole in applesauce without overt s/s of aspiration. Per recommendation from MBS, pt would benefit from trials at bedside of regular and upgrade as tolerated. Pt also presents with significant cognitive deficits as evidenced by score of 8 out of 30 on [place] version 7/1 (n=>26). Specific deficits are in the areas of basic functional problem solving, sustained attention, recall of new information - including orientation information as well as decreased intellectual awareness. Pt's speech intelligiblity is good at the complex conversation level but again at times, his intelligiblity was impacted by hiccups - not dysarthria. Skilled ST is required to address the above mentioned deficits, advance diet, increase functional independence and reduce caregiver burden. Anticipate that pt will need follow up [place] at [place].    Skilled Therapeutic Interventions          Skilled treatment session focused on providing Max A for orientation information to time and situation. Pt also benefited from Max A to demonstrate intellectual awareness.    SLP Assessment  Patient will need skilled Speech Lanaguage Pathology Services during CIR admission    Recommendations  SLP Diet Recommendations: Dysphagia 3 (Mech soft);Thin Liquid Administration via: Cup;Straw Medication Administration: Whole meds with puree Supervision: Patient able to self feed;Intermittent supervision to cue for compensatory strategies Compensations: Minimize environmental distractions;Slow rate;Small sips/bites Postural Changes and/or Swallow Maneuvers: Seated upright 90 degrees Oral Care Recommendations: Oral care BID Patient destination: Home Follow up Recommendations: Home Health  SLP Equipment Recommended: None recommended by SLP    SLP Frequency 3 to 5 out of 7 days   SLP Duration  SLP Intensity  SLP Treatment/Interventions  7 to 10 days  Minumum of 1-2 x/day, 30 to 90 minutes  Cognitive remediation/compensation;Cueing hierarchy;Dysphagia/aspiration precaution training;Functional tasks;Medication managment;Patient/family education;Therapeutic Activities;Internal/external aids    Pain    Prior Functioning Cognitive/Linguistic Baseline: Baseline deficits Baseline deficit details: memory Type of Home: House  Lives With: Spouse;Son Available Help at Discharge: Family;Available 24 hours/day Education: 12th  Vocation: Retired  Function:   Cognition Comprehension Comprehension assist level: Understands basic 75 - 89% of the time/ requires cueing 10 - 24% of the time  Expression   Expression assist level: Expresses basic 90% of the time/requires cueing < 10% of the time.  Social Interaction Social Interaction assist level: Interacts appropriately with others - No medications needed.  Problem Solving Problem solving assist level: Solves basic 50 - 74% of the time/requires cueing 25 - 49% of the time  Memory Memory assist level: Recognizes or recalls 25 - 49% of the time/requires cueing 50 - 75% of the time   Short Term Goals: Week 1: SLP Short Term Goal 1 (Week 1): Pt will tolerate regular textures trials with supervision use of compensatory swallow strategies.  SLP Short Term Goal 2 (Week 1): Pt will utilize external memory aids to recall new, daily information with Mod A cues.  SLP Short Term Goal 3 (Week 1): Pt will answer orientation questions with Mod A cues.  SLP Short Term Goal 4 (Week 1): Pt will sustain attention to functional tasks for ~ 15 minutes with Min A cues for redirection. SLP Short Term Goal 5 (Week 1):  Pt will complete basic functional task with Mod A cues.  SLP Short Term Goal 6 (Week 1): Pt will state 1 physical and 1 cognitive deficit  related to new CVA with Mod A cues.   Refer to Care Plan for Long Term Goals  Recommendations for other services: None   Discharge Criteria: Patient will be discharged from SLP if patient refuses treatment 3 consecutive times without medical reason, if treatment goals not met, if there is a change in medical status, if patient makes no progress towards goals or if patient is discharged from hospital.  The above assessment, treatment plan, treatment alternatives and goals were discussed and mutually agreed upon: by patient  Pharrah Rottman 01/10/2017, 12:06 PM

## 2017-01-10 NOTE — Progress Notes (Signed)
Patient was complaining of shortness of breath &" feeling sick". He could not explain what he meant at the time. His blood pressure was already reported to be elevated by tech. Retaken along with oxygen saturation BP 190/113, pulse 70, oxygen saturation 93%, respirations at 22. Patient was started on oxygen by nasal canula at 2 lpm, he was staightened up in bed, he was slumped to the side. Patient had a smear of bowel movement noted when he was being turned. He was reported to be continent of bowel and bladder. Condom catheter was in place. After being cleaned up & positioned with the head of the bed up, he seemed not to be in any distress. A cool cloth was applied to his head & the temperature in the room decreased. His PRN clonidine was given for his hypertensive episode. He spit up some of the yogurt that it was given with but the medication did not seem to be in it. Martin Majestic back to check on him a few times & he was resting. Rechecked his BP at about 0023, it was 197/104, pulse 75, oxygen at 99% by nasal canula at 2 lpm. On call provider was called & informed. She requested a manual BP, was taken at 0058 & was 182/100. Verbal orders were given for a recheck of the BP in a hour. Patient was calm, responsive & alert at the time. No complains of pain . Will continue to monitor

## 2017-01-11 ENCOUNTER — Inpatient Hospital Stay (HOSPITAL_COMMUNITY): Payer: Medicare Other | Admitting: Occupational Therapy

## 2017-01-11 ENCOUNTER — Inpatient Hospital Stay (HOSPITAL_COMMUNITY): Payer: Medicare Other | Admitting: Physical Therapy

## 2017-01-11 ENCOUNTER — Inpatient Hospital Stay (HOSPITAL_COMMUNITY): Payer: Medicare Other | Admitting: Speech Pathology

## 2017-01-11 MED ORDER — BACLOFEN 5 MG HALF TABLET
5.0000 mg | ORAL_TABLET | Freq: Once | ORAL | Status: AC
Start: 2017-01-11 — End: 2017-01-11
  Administered 2017-01-11: 5 mg via ORAL
  Filled 2017-01-11: qty 1

## 2017-01-11 MED ORDER — IPRATROPIUM-ALBUTEROL 0.5-2.5 (3) MG/3ML IN SOLN
3.0000 mL | RESPIRATORY_TRACT | Status: DC | PRN
  Administered 2017-01-11: 3 mL via RESPIRATORY_TRACT
  Filled 2017-01-11: qty 3

## 2017-01-11 NOTE — Progress Notes (Signed)
Speech Language Pathology Daily Session Note  Patient Details  Name: Thomas [name] MRN: [id number] Date of Birth: 12-24-1925  Today's Date: 01/11/2017 SLP Individual Time: 1030-1130 SLP Individual Time Calculation (min): 60 min  Short Term Goals: Week 1: SLP Short Term Goal 1 (Week 1): Pt will tolerate regular textures trials with supervision use of compensatory swallow strategies.  SLP Short Term Goal 2 (Week 1): Pt will utilize external memory aids to recall new, daily information with Mod A cues.  SLP Short Term Goal 3 (Week 1): Pt will answer orientation questions with Mod A cues.  SLP Short Term Goal 4 (Week 1): Pt will sustain attention to functional tasks for ~ 15 minutes with Min A cues for redirection. SLP Short Term Goal 5 (Week 1): Pt will complete basic functional task with Mod A cues.  SLP Short Term Goal 6 (Week 1): Pt will state 1 physical and 1 cognitive deficit related to new CVA with Mod A cues.   Skilled Therapeutic Interventions: Skilled treatment session focused on cognition goals. SLP facilitated session by providing Mod A multimodal cues for structured medication management task. SLP structured task for success, if task had not be structured, pt would have needed Total A. Pt with decreased problem solving (i.e., 1 tablet went in each slot for 7 days, he put one tablet in and then stopped). Pt required Max cues for orientation information. Pt was handed off to OT. Continue per current plan of care.      Function:    Cognition Comprehension Comprehension assist level: Understands basic 50 - 74% of the time/ requires cueing 25 - 49% of the time  Expression   Expression assist level: Expresses basic 75 - 89% of the time/requires cueing 10 - 24% of the time. Needs helper to occlude trach/needs to repeat words.  Social Interaction Social Interaction assist level: Interacts appropriately 75 - 89% of the time - Needs redirection for appropriate language or to initiate  interaction.  Problem Solving Problem solving assist level: Solves basic 25 - 49% of the time - needs direction more than half the time to initiate, plan or complete simple activities  Memory Memory assist level: Recognizes or recalls less than 25% of the time/requires cueing greater than 75% of the time    Pain Pain Assessment Pain Assessment: No/denies pain Pain Score: 0-No pain  Therapy/Group: Individual Therapy   Harla Mensch B. Rutherford Nail, M.S., [place] 01/11/2017, 11:16 AM

## 2017-01-11 NOTE — Progress Notes (Signed)
Occupational Therapy Session Note  Patient Details  Name: Thomas [name] MRN: [id number] Date of Birth: 04-Jul-1926  Today's Date: 01/11/2017 OT Individual Time: 9758-8325 and 1130-1150 OT Individual Time Calculation (min): 45 min and 20 min (missed 10 min due to fatigue and elevated blood pressure)   Short Term Goals: Week 1:  OT Short Term Goal 1 (Week 1): Pt will stand with supervision to complete 2 grooming tasks at sink OT Short Term Goal 2 (Week 1): Pt will ambulate into bathroom with min A in prep for ADL task OT Short Term Goal 3 (Week 1): Pt will complete 3/3 toileting tasks with CGA  Skilled Therapeutic Interventions/Progress Updates:    Visit 1:  No c/o pain  Pt seen this session for ADL retraining with a focus on activity tolerance, postural control,  Cognitive awareness with RW safety.  Pt's granddaughter present for this session. Pt agreeable to a shower.  Sat to EOB with min A and cued pt to sit for a moment before standing. He needed frequent cues to push up with his hands and not just grab the walker.  Once in standing he had a R lateral lean that increased with ambulation, therefore he needed mod A support at his hips.  Pt needs max cues with RW management as he pushes it forward and away from his body so he is not able to use it for support.  Pt sat on elevated toilet seat.  His brief was wet.  Discussed with pt and Gdaughter timed toileting that may need to be implemented. Pt transferred onto shower bench.  Cued pt to use lateral leans to wash bottom versus standing.  Pt was able to wash adequately enough.  When getting out of shower, cued to stand for a minute to ensure he was not having dizziness. Sat EOB to dress with steadying A at times.  Pt feeling very fatigued by this points and did not want to brush teeth. Requested to lay down. Adjusted in bed with bed alarm and all needs met.     Visit 2: No c/o pain Pt received from speech therapist. Pt stated that he needed to  toilet. Pt used RW from sink to toilet with mod A (cuing for pushing up vs pulling, cues for RW placement, cues for midline awareness.)  Pt's brief was wet but he was able to go a little more.  Pt stated he was feeling nauseated. Returned to [employer]. Blood pressure 151/105.  Pt stated he needed to get back to bed as he was feeling tired and still nauseated.  Used a stand pivot with min A.  Pt's adjusted in bed with all needs met.  Bed alarm set.   Therapy Documentation Precautions:  Precautions Precautions: Fall Restrictions Weight Bearing Restrictions: No    Vital Signs: Therapy Vitals Temp: 98 F (36.7 C) Temp Source: Oral Pulse Rate: 81 Resp: 18 BP: (!) 157/93 (after shower and dressing) at 900 and 151/105 at 1145 after toileting Patient Position (if appropriate): Sitting Oxygen Therapy SpO2: 98 % O2 Device: Not Delivered Pain: Pain Assessment Pain Assessment: No/denies pain Pain Score: 0-No pain ADL:   See Function Navigator for Current Functional Status.   Therapy/Group: Individual Therapy  [place] 01/11/2017, 9:26 AM

## 2017-01-11 NOTE — Progress Notes (Signed)
Patient information reviewed and entered into eRehab system by Capone Schwinn, RN, CRRN, PPS Coordinator.  Information including medical coding and functional independence measure will be reviewed and updated through discharge.     Per nursing patient was given "Data Collection Information Summary for Patients in Inpatient Rehabilitation Facilities with attached "Privacy Act Statement-Health Care Records" upon admission.  

## 2017-01-11 NOTE — Progress Notes (Signed)
Physical Therapy Session Note  Patient Details  Name: Thomas [name] MRN: [id number] Date of Birth: Oct 03, 1925  Today's Date: 01/11/2017 PT Individual Time:1335-1500   Individual time: 85 min   Short Term Goals: Week 1:  PT Short Term Goal 1 (Week 1): pt will transfer with min assist consistently PT Short Term Goal 2 (Week 1): Pt will ambulate 174f with min assist  PT Short Term Goal 3 (Week 1): Pt will propel WC 153fwith supervision assist PT Short Term Goal 4 (Week 1): Pt will maintain dynamic balance with min assist to prevent Lateral LOB to the R.   Skilled Therapeutic Interventions/Progress Updates:    Pt received supine in bed and agreeable to PT. Supine>sit transfer with supervision assist and min cues for safety BP taken EOB 160/95,   Stand pivot transfer with RW and mod assist to WC. Moderate cues for proper RLE positioning to prevent R Lateral LOB [facility] Vital signs assessed 120/100. PT allowed pt 3 minute rest to attempt reduction of BP. 143/77.   PT instructed pt in dynmaic balance training with emphasis on improved weight shifting to the L as well as improved midline orientation. Lateral reaching outside BOS R and L x 10 bilaterally with supervision assist. Moderate cues for proper weight shifting over the LLE.  Stepping to semitandem positioning with moderate-max assist due to constant R lateral LOB with step. Pt noted to have improved weight shifting to the LLE with mirror feedback with only min assist to prevent LOB. Gait training with RW x 6079fith multimodal cues inclduing mirror feedback to improved L lateral weight shift and prevent adduction of RLE.  BP assessed following gait 125/80. Pt ambulated to Nustep with min assist and RW x 10 ft.   Nustep reciprocal movement training x 5 minutes with min cues to maintain upright posture and prevent R LOB as well as equalized force with BLE. Following Nustep training PT assessed BP  157/76, HR 72. 1 minute later pt noted to have slurred speech followed by nausea and emesis. PT assessed Vitals once pt finished emesis BP 194/102, HR 120. Pt's speech returned to normal. Patient returned too room and [facility] and all needs met.   Once in room, pt reports increased nausea and second emesis episode. Vitals assessed with RN present. 150/88. RN made aware of Vitals while pt at therapy as well as change in speech lasting less than 2 minutes.  Pt left sitting in room with RN present.    Therapy Documentation Precautions:  Precautions Precautions: Fall Restrictions Weight Bearing Restrictions: No General:   Vital Signs: Therapy Vitals Resp: 20 BP: (!) 156/93 Patient Position (if appropriate): Sitting Oxygen Therapy SpO2: 98 % O2 Device: Not Delivered Pain:  0/10   See Function Navigator for Current Functional Status.   Therapy/Group: Individual Therapy  AusLorie Phenix31/2018, 2:45 PM

## 2017-01-11 NOTE — Progress Notes (Signed)
[facility] PHYSICAL MEDICINE & REHABILITATION     PROGRESS NOTE    Subjective/Complaints: Pt in bed. No complaints. Wearing oxygen. Daughter questioned why he was on oxygen.   ROS: pt denies nausea, vomiting, diarrhea, cough, shortness of breath or chest pain    Objective: Vital Signs: Blood pressure (!) 157/93, pulse 81, temperature 98 F (36.7 C), temperature source Oral, resp. rate 18, height 5\' 11"  (1.803 m), weight 74.8 kg (165 lb), SpO2 98 %. No results found.  Recent Labs  01/10/17 0658  WBC 4.8  HGB 12.6*  HCT 40.0  PLT 283    Recent Labs  01/10/17 0658  NA 136  K 3.8  CL 100*  GLUCOSE 114*  BUN 10  CREATININE 1.23  CALCIUM 9.1   CBG (last 3)  No results for input(s): GLUCAP in the last 72 hours.  Wt Readings from Last 3 Encounters:  01/10/17 74.8 kg (165 lb)  01/05/17 72.6 kg (160 lb 0.9 oz)  12/22/16 75.3 kg (166 lb)    Physical Exam:  Constitutional: He is oriented to person, place, and time. He appears well-developedand well-nourished.  HENT:  Head: Normocephalicand atraumatic.  Mouth/Throat: Oropharynx is clear and moist.  Eyes: Conjunctivaeand EOMare normal. Pupils are equal, round, and reactive to light.  Neck: Normal range of motion. Neck supple.  Cardiovascular:  RRR.  Respiratory: lungs clear. Normal effort. Wearing [place] with oxygen this morning GI: soft, NT Musculoskeletal: He exhibits no edemaor tenderness.  Neurological: He is alertand oriented to person, place, and time.  Dysarthric but intelligible  Able to follow simple commands without difficulty. Dysmetria with right finger to nose more than left. .  Sensory deficits RUE and RLE present.  Motor: 4+/5 throughout bilateral UE and LE's  Skin: Skin is warmand dry. No rashnoted. No erythema.  Psychiatric: He has a normal mood and affect. His behavior is normal. Thought contentnormal.   Assessment/Plan: 1. Right sided ataxia, balance and cognitive/swallowing deficits  secondary to right cerebellar and pontine infarcts which require 3+ hours per day of interdisciplinary therapy in a comprehensive inpatient rehab setting. Physiatrist is providing close team supervision and 24 hour management of active medical problems listed below. Physiatrist and rehab team continue to assess barriers to discharge/monitor patient progress toward functional and medical goals.  Function:  Bathing Bathing position   Position: Sitting EOB  Bathing parts Body parts bathed by patient: Right arm, Left arm, Chest, Abdomen, Right upper leg, Left upper leg, Right lower leg, Left lower leg Body parts bathed by helper: Back  Bathing assist Assist Level: Touching or steadying assistance(Pt > 75%)      Upper Body Dressing/Undressing Upper body dressing   What is the patient wearing?: Pull over shirt/dress     Pull over shirt/dress - Perfomed by patient: Thread/unthread right sleeve, Thread/unthread left sleeve, Put head through opening, Pull shirt over trunk          Upper body assist Assist Level: Supervision or verbal cues, Set up   Set up : To obtain clothing/put away  Lower Body Dressing/Undressing Lower body dressing   What is the patient wearing?: Pants, Non-skid slipper socks     Pants- Performed by patient: Thread/unthread right pants leg, Thread/unthread left pants leg, Pull pants up/down   Non-skid slipper socks- Performed by patient: Don/doff right sock, Don/doff left sock                    Lower body assist Assist for lower body dressing: Touching  or steadying assistance (Pt > 75%)      Toileting Toileting Toileting activity did not occur: No continent bowel/bladder event Toileting steps completed by patient: Adjust clothing prior to toileting Toileting steps completed by helper: Performs perineal hygiene, Adjust clothing after toileting Toileting Assistive Devices: Grab bar or rail  Toileting assist Assist level: Touching or steadying assistance  (Pt.75%)   Transfers Chair/bed transfer Chair/bed transfer activity did not occur: Safety/medical concerns   Chair/bed transfer assist level: Moderate assist (Pt 50 - 74%/lift or lower) Chair/bed transfer assistive device: Armrests     Locomotion Ambulation     Max distance: 72ft  Assist level: Moderate assist (Pt 50 - 74%)   Wheelchair   Type: Manual Max wheelchair distance: 135 Assist Level: Supervision or verbal cues  Cognition Comprehension Comprehension assist level: Understands basic 50 - 74% of the time/ requires cueing 25 - 49% of the time  Expression Expression assist level: Expresses basic 75 - 89% of the time/requires cueing 10 - 24% of the time. Needs helper to occlude trach/needs to repeat words.  Social Interaction Social Interaction assist level: Interacts appropriately 75 - 89% of the time - Needs redirection for appropriate language or to initiate interaction.  Problem Solving Problem solving assist level: Solves basic 25 - 49% of the time - needs direction more than half the time to initiate, plan or complete simple activities  Memory Memory assist level: Recognizes or recalls less than 25% of the time/requires cueing greater than 75% of the time   Medical Problem List and Plan: 1. Balance, swallowing, communication and functional deficitssecondary to right cerebellar and pontine infarcts -continue therapies 2. DVT Prophylaxis/Anticoagulation: Pharmaceutical: Heparin 3. Pain Management: N/A 4. Mood: Has a positive outlook and supportive family. LCSW to follow for evaluation and support.  5. Neuropsych: This patient iscapable of making decisions on hisown behalf. 6. Skin/Wound Care: routine pressure relief measures.  7. Fluids/Electrolytes/Nutrition: Monitor I/O.    -encourage PO 8. HTN: improvement with increased norvasc  -continue norvasc at 10mg  daily  -prn clonidine  -avoid over-correction 9. Malignant mesothelioma with recurrent pleural  effusion: Lifespan of less than 6 months.  10. A fib: Off Eliquis--to be resumed  11. Hypokalemia: resolved with supplement.  12. T2DM with Hgb A1C-6.9: Fasting/random BS have ranged from 110 -130.     -dietary education 13. Dyslipidemia: On Crestor.    LOS (Days) 2 A Noble T, MD 01/11/2017 9:48 AM

## 2017-01-12 ENCOUNTER — Inpatient Hospital Stay (HOSPITAL_COMMUNITY): Payer: Medicare Other | Admitting: Physical Therapy

## 2017-01-12 ENCOUNTER — Inpatient Hospital Stay (HOSPITAL_COMMUNITY): Payer: Medicare Other | Admitting: Speech Pathology

## 2017-01-12 ENCOUNTER — Inpatient Hospital Stay (HOSPITAL_COMMUNITY): Payer: Medicare Other | Admitting: Occupational Therapy
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
## 2017-01-12 NOTE — Progress Notes (Signed)
[facility] Individual Statement of Services  Patient Name:  Thomas [name]  Date:  01/12/2017  Welcome to the [facility].  Our goal is to provide you with an individualized program based on your diagnosis and situation, designed to meet your specific needs.  With this comprehensive rehabilitation program, you will be expected to participate in at least 3 hours of rehabilitation therapies Monday-Friday, with modified therapy programming on the weekends.  Your rehabilitation program will include the following services:  Physical Therapy (PT), Occupational Therapy (OT), Speech Therapy (ST), 24 hour per day rehabilitation nursing, Neuropsychology, Case Management (Social Worker), Rehabilitation Medicine, Nutrition Services and Pharmacy Services  Weekly team conferences will be held on Wednesdays to discuss your progress.  Your Social Worker will talk with you frequently to get your input and to update you on team discussions.  Team conferences with you and your family in attendance may also be held.  Expected length of stay: 7 to 14 days  Overall anticipated outcome: Supervision overall  Depending on your progress and recovery, your program may change. Your Social Worker will coordinate services and will keep you informed of any changes. Your Social Worker's name and contact numbers are listed  below.  The following services may also be recommended but are not provided by the [facility] will be made to provide these services after discharge if needed.  Arrangements include referral to agencies that provide these services.  Your insurance has been verified to be:  [employer] Your primary doctor is:  Dr. Garret Reddish  Pertinent information will be shared with your doctor and your insurance  company.  Social Worker:  Alfonse Alpers, LCSW  [phone] or ([phone]  Information discussed with and copy given to patient by: Trey Sailors, 01/12/2017, 1:45 AM

## 2017-01-12 NOTE — Patient Care Conference (Signed)
Inpatient RehabilitationTeam Conference and Plan of Care Update Date: 01/10/2017   Time: 10:55 AM    Patient Name: Thomas [name]      Medical Record Number: [id number]  Date of Birth: 1926-02-05 Sex: Male         Room/Bed: 4W21C/4W21C-01 Payor Info: Payor: [place] / Plan: BCBS MEDICARE / Product Type: *No Product type* /    Admitting Diagnosis: R CVA  Admit Date/Time:  01/09/2017  6:24 PM Admission Comments: No comment available   Primary Diagnosis:  <principal problem not specified> Principal Problem: <principal problem not specified>  Patient Active Problem List   Diagnosis Date Noted  . Stroke due to embolism of right posterior cerebral artery ([place]) 01/09/2017  . Ataxia   . Benign essential HTN   . Esophageal stricture   . PAF (paroxysmal atrial fibrillation) ([place])   . Recurrent left pleural effusion   . Dysphagia, post-stroke   . Type 2 diabetes mellitus with circulatory disorder ([place])   . Acute blood loss anemia   . Mesothelioma of left lung ([place])   . Palliative care by specialist   . Stroke due to embolism of posterior inferior cerebral artery ([place]) d/t AF 01/05/2017  . Goals of care, counseling/discussion 12/23/2016  . Encounter for antineoplastic chemotherapy 12/23/2016  . Vertigo following cerebrovascular accident 12/16/2016  . Hyperkalemia 12/16/2016  . Type 2 diabetes mellitus with hyperlipidemia ([place]) 12/16/2016  . Insomnia 12/07/2016  . Atelectasis 10/30/2016  . Alcohol abuse 08/04/2016  . Permanent atrial fibrillation ([place]) 07/21/2016  . Malignant mesothelioma of pleura ([place]) 02/16/2016  . Perforation of right tympanic membrane 11/24/2013  . Carotid stenosis 02/21/2013  . TIA (transient ischemic attack) 02/17/2013  . Hypokalemia 11/26/2012  . GERD (gastroesophageal reflux disease) 05/17/2011  . Dysphagia 05/17/2011  . Stricture and stenosis of esophagus 08/29/2010  . Allergic rhinitis 01/16/2008  . ERECTILE DYSFUNCTION, ORGANIC  03/26/2007  . PROSTATE CANCER, HX OF 03/26/2007  . Hyperlipidemia 03/20/2007  . Essential hypertension 03/20/2007    Expected Discharge Date: Expected Discharge Date:  (2 - 2 1/2 weeks)  Team Members Present: Physician leading conference: Dr. Delice Lesch Social Worker Present: Alfonse Alpers, LCSW Nurse Present: Other (comment) Pleas Koch, RN) PT Present: Phylliss Bob, PTA;Barrie Folk, PT OT Present: Napoleon Form, OT SLP Present: Stormy Fabian, SLP PPS Coordinator present : Daiva Nakayama, RN, CRRN     Current Status/Progress Goal Weekly Team Focus  Medical   right cerebellar and pontine hemorrhage. hypertension, dysphagia  improve bp control  bp mgt, nutrition, stroke education   Bowel/Bladder   reported to be continent but had a small incontinence of bowel last night, LBM 01/09/17, wears condom catheter at night  be continent of bowel & bladder  continue to monitor & assist as needed   Swallow/Nutrition/ Hydration   supervision with dysphagia 3 and thin liquids, medicine whole in puree  Supervision with least restrictive diet  trials of regular diet textures   ADL's   Min-mod A overall  Supervision overall  Standing balance, activity tolerance, ADL re-training, cognitive remediation   Mobility   eval pending         Communication             Safety/Cognition/ Behavioral Observations  Mod A for basic problem solving, sustained attention, intellectual awareness, recall of new information  Min A   use of external memory aids, basic problem solving, sustained attention   Pain   no c/o pain  pain scale <3  continue  to assess & treat as needed   Skin   no areas of skin break down  no new areas of skin break down  continue to assess q shift    Rehab Goals Patient on target to meet rehab goals: Yes Rehab Goals Revised: none - pt was being evaluated on the day of conference *See Care Plan and progress notes for long and short-term goals.  Barriers to Discharge: safety  awareness, poor balance    Possible Resolutions to Barriers:  vestibular/balance rx, family education, fall precautions    Discharge Planning/Teaching Needs:  Pt to return to his home and family to arrange 24/7 supervision for him.  Family education will be provided closer to d/c.   Team Discussion:  Pt being evaluated on the day of conference.  Pt will need 24/7 supervision per ST.  OT discussed with Dr. Posey Pronto parameters of pt's BP and when it is okay to treat pt.  Dr. Posey Pronto is adjusting BP medications to get him into a normal range - elevated currently.  Revisions to Treatment Plan:  none   Continued Need for Acute Rehabilitation Level of Care: The patient requires daily medical management by a physician with specialized training in physical medicine and rehabilitation for the following conditions: Daily direction of a multidisciplinary physical rehabilitation program to ensure safe treatment while eliciting the highest outcome that is of practical value to the patient.: Yes Daily medical management of patient stability for increased activity during participation in an intensive rehabilitation regime.: Yes Daily analysis of laboratory values and/or radiology reports with any subsequent need for medication adjustment of medical intervention for : Neurological problems;Blood pressure problems  Shahmeer Bunn, Silvestre Mesi 01/12/2017, 2:45 AM

## 2017-01-12 NOTE — IPOC Note (Signed)
Overall Plan of Care [facility]) Patient Details Name: Kiree Dejarnette MRN: [id number] DOB: [birth year]  Admitting Diagnosis: R CVA  Hospital Problems: Active Problems:   Stroke due to embolism of right posterior cerebral artery ([place])   Ataxia     Functional Problem List: Nursing Bladder, Bowel, Endurance, Medication Management, Motor, Nutrition, Pain, Perception, Safety, Skin Integrity  PT Balance, Behavior, Endurance, Motor, Perception, Safety  OT Endurance, Safety, Cognition, Balance  SLP Cognition  TR         Basic ADL's: OT Grooming, Bathing, Dressing, Toileting     Advanced  ADL's: OT       Transfers: PT Bed Mobility, Bed to Chair, Car, Furniture, Futures trader, Metallurgist: PT Ambulation, Emergency planning/management officer, Stairs     Additional Impairments: OT None  SLP Swallowing, Social Cognition   Problem Solving, Memory, Attention  TR      Anticipated Outcomes Item Anticipated Outcome  Self Feeding Mod I  Swallowing  Mod I   Basic self-care  Media planner Transfers Supervision  Bowel/Bladder  Mod I   Transfers  supervision with LRAD   Locomotion  Supervision with LRAD at household distance  Communication     Cognition  Supervision to Min A  Pain  less than 3  Safety/Judgment  mod assist    Therapy Plan: PT Intensity: Minimum of 1-2 x/day ,45 to 90 minutes PT Frequency: 5 out of 7 days PT Duration Estimated Length of Stay: 10-14 days  OT Intensity: Minimum of 1-2 x/day, 45 to 90 minutes OT Frequency: 5 out of 7 days OT Duration/Estimated Length of Stay: 7-10 days SLP Intensity: Minumum of 1-2 x/day, 30 to 90 minutes SLP Frequency: 3 to 5 out of 7 days SLP Duration/Estimated Length of Stay: 7 to 10 days       Team Interventions: Nursing Interventions Patient/Family Education, Bladder Management, Bowel Management, Disease Management/Prevention, Pain Management, Medication Management, Cognitive  Remediation/Compensation, Dysphagia/Aspiration Precaution Training, Discharge Planning, Psychosocial Support  PT interventions Ambulation/gait training, Training and development officer, Cognitive remediation/compensation, Community reintegration, Discharge planning, Disease management/prevention, DME/adaptive equipment instruction, Functional electrical stimulation, Functional mobility training, Neuromuscular re-education, Patient/family education, Psychosocial support, Splinting/orthotics, Stair training, Therapeutic Activities, Therapeutic Exercise, UE/LE Strength taining/ROM, Visual/perceptual remediation/compensation, UE/LE Coordination activities, Wheelchair propulsion/positioning  OT Interventions Training and development officer, Cognitive remediation/compensation, Discharge planning, Community reintegration, Engineer, drilling, Functional mobility training, Neuromuscular re-education, Psychosocial support, Patient/family education, Self Care/advanced ADL retraining, Therapeutic Activities, Therapeutic Exercise, UE/LE Strength taining/ROM, UE/LE Coordination activities  SLP Interventions Cognitive remediation/compensation, Cueing hierarchy, Dysphagia/aspiration precaution training, Functional tasks, Medication managment, Patient/family education, Therapeutic Activities, Internal/external aids  TR Interventions    SW/CM Interventions Discharge Planning, Psychosocial Support, Patient/Family Education    Team Discharge Planning: Destination: PT-Home ,OT- Home , SLP-Home Projected Follow-up: PT-Home health PT, OT-  Home health OT, SLP-Home Health SLP Projected Equipment Needs: PT-Wheelchair cushion (measurements), Wheelchair (measurements), Rolling walker with 5" wheels, OT- To be determined, SLP-None recommended by SLP Equipment Details: PT- , OT-  Patient/family involved in discharge planning: PT- Patient,  OT-Patient, SLP-Patient  MD ELOS: 10-14 days Medical Rehab Prognosis:   Excellent Assessment: The patient has been admitted for CIR therapies with the diagnosis of right cerebellar infarct. The team will be addressing functional mobility, strength, stamina, balance, safety, adaptive techniques and equipment, self-care, bowel and bladder mgt, patient and caregiver education, vestibular rx, NMR, cognitive perceptual rx, communication and swallowing, ego support. Goals have been set at mod I to supervision for basic  mobility and self-care tasks and ADL's and supervision to min assist with cognition.    Meredith Staggers, MD, FAAPMR      See Team Conference Notes for weekly updates to the plan of care

## 2017-01-12 NOTE — Progress Notes (Signed)
Physical Therapy Session Note  Patient Details  Name: Thomas [name] MRN: [id number] Date of Birth: 1926/06/18  Today's Date: 01/12/2017 PT Individual Time: 1100-1200 PT Individual Time Calculation (min): 60 min   Short Term Goals: Week 1:  PT Short Term Goal 1 (Week 1): pt will transfer with min assist consistently PT Short Term Goal 2 (Week 1): Pt will ambulate 132f with min assist  PT Short Term Goal 3 (Week 1): Pt will propel WC 1540fwith supervision assist PT Short Term Goal 4 (Week 1): Pt will maintain dynamic balance with min assist to prevent Lateral LOB to the R.   Skilled Therapeutic Interventions/Progress Updates:   Pt received supine in bed and agreeable to PT. Supine>sit transfer with supervision assist and min cues for safety.   [facility] and moderate cues for set. RN assessed pt's BP manually (see flow sheet)  Pt instructed in WC mobility with BUE support x 16069fith supervision assist and min cues for improved use of the LUE.   Pt instructed in clocked practice sit<>stand with min assist from PT. Moderate cues for improved COM positioning to increase WB through the LLE and prevent R LOB. PT instructed pt in forward backward walking in RW with mod assist and max multimodal cues to prevent R LOB improve midline orietation.  Standing balance for L lateral reach ouside BOS to force WB through the LLE x 10. No carryover with attempt to ambulate in WR [place] L weight shift.  Forward backward gait at rail in hall. Min-mod assist from PT.   Side stepping at rail in hall x 7 ft L and R with min-mod assist from PT.  Max cues for improved L lateral weight shift to allow improved step length on the RLE, only minor adjustments noted from pt following instruction from PT.   Attempted SLS on the L. Unable to maintain with 1 UE or 2 UE support dfue to poor midline orientation.   Patient returned too room and [facility] reach and  all needs met.         Therapy Documentation Precautions:  Precautions Precautions: Fall Restrictions Weight Bearing Restrictions: No Vital Signs: Therapy Vitals BP: (!) 157/78 Patient Position (if appropriate): Sitting Pain: Pain Assessment Pain Assessment: No/denies pain   See Function Navigator for Current Functional Status.   Therapy/Group: Individual Therapy  AusLorie Phenix1/2018, 2:01 PM

## 2017-01-12 NOTE — Progress Notes (Signed)
Occupational Therapy Session Note  Patient Details  Name: Thomas [name] MRN: [id number] Date of Birth: 09-Aug-1926  Today's Date: 01/12/2017 OT Individual Time: 8101-7510 OT Individual Time Calculation (min): 75 min    Short Term Goals: Week 1:  OT Short Term Goal 1 (Week 1): Pt will stand with supervision to complete 2 grooming tasks at sink OT Short Term Goal 2 (Week 1): Pt will ambulate into bathroom with min A in prep for ADL task OT Short Term Goal 3 (Week 1): Pt will complete 3/3 toileting tasks with CGA  Skilled Therapeutic Interventions/Progress Updates:    Pt seen for BADL training of shower and dressing with a focus on activity tolerance, midline awareness and postural control and management of the RW.  Pt needed less cuing today to push up from seat versus pulling on the RW.  He demonstrated improved postural control during the sit ><stand phase but continued to have a lean during ambulation with RW as his BOS is very narrow.  He also pushes the walker too far in front of him and to the L so he doesn't have adequate support on his R.   To address this, fastened orange theraband across handlebars to give him a visual reference on where to place his hips. This did assist him but he will need continued training.    Pt ambulated to bathroom and tranfered to toilet and then shower with RW with mod A to maintain posture as he was turning around. In shower, pt used L hand on grab bar and stood with min A so he could use his R hand to thoroughly cleanse his bottom.  Returned to w/c in room to dress.  Needed some cues and slight guiding A with R pant leg and sock but otherwise he was able to don them.  He donned sneakers and tied them without assist.    Continued to work on balance with controlled sit to stand and mod cues to reach back for chair in sit phase.  Granddaughter arrived at end of session.    Pt in wc with family in the room with him.  Therapy Documentation Precautions:   Precautions Precautions: Fall Restrictions Weight Bearing Restrictions: No    Vital Signs: Therapy Vitals BP: (!) 156/84 Patient Position (if appropriate): Sitting Pain: Pain Assessment Pain Assessment: No/denies pain   ADL:  See Function Navigator for Current Functional Status.   Therapy/Group: Individual Therapy  [place] 01/12/2017, 11:44 AM

## 2017-01-12 NOTE — Progress Notes (Signed)
SLP Make-up Session Cancellation Note  Patient Details Name: Thomas [name] MRN: [id number] DOB: January 18, 1926   Cancelled treatment:       Patient missed 30 minutes of skilled SLP intervention due to fatigue. RN aware.                                                                                                Isatu Macinnes 01/12/2017, 3:28 PM

## 2017-01-12 NOTE — Progress Notes (Signed)
[place] PHYSICAL MEDICINE & REHABILITATION     PROGRESS NOTE    Subjective/Complaints: .  Ate well, no breathing issues  ROS: pt denies nausea, vomiting, diarrhea, cough, shortness of breath or chest pain    Objective: Vital Signs: Blood pressure 134/74, pulse 79, temperature 98.8 F (37.1 C), temperature source Oral, resp. rate 18, height 5\' 11"  (1.803 m), weight 74.8 kg (165 lb), SpO2 97 %. No results found.  Recent Labs  01/10/17 0658  WBC 4.8  HGB 12.6*  HCT 40.0  PLT 283    Recent Labs  01/10/17 0658  NA 136  K 3.8  CL 100*  GLUCOSE 114*  BUN 10  CREATININE 1.23  CALCIUM 9.1   CBG (last 3)  No results for input(s): GLUCAP in the last 72 hours.  Wt Readings from Last 3 Encounters:  01/10/17 74.8 kg (165 lb)  01/05/17 72.6 kg (160 lb 0.9 oz)  12/22/16 75.3 kg (166 lb)    Physical Exam:  Constitutional: He is oriented to person, place, and time. He appears well-developedand well-nourished.  HENT:  Head: Normocephalicand atraumatic.  Mouth/Throat: Oropharynx is clear and moist.  Eyes: Conjunctivaeand EOMare normal. Pupils are equal, round, and reactive to light.  Neck: Normal range of motion. Neck supple.  Cardiovascular:  RRR.  Respiratory: lungs clear. Normal effort.Bronchophony on left  GI: soft, NT Musculoskeletal: He exhibits no edemaor tenderness.  Neurological: He is alertand oriented to person, place, and time.  Dysarthric but intelligible  Able to follow simple commands without difficulty. Dysmetria with right finger to nose more than left. .  Sensory deficits RUE and RLE present.  Motor: 4+/5 throughout bilateral UE and LE's  Skin: Skin is warmand dry. No rashnoted. No erythema.  Psychiatric: He has a normal mood and affect. His behavior is normal. Thought contentnormal.   Assessment/Plan: 1. Right sided ataxia, balance and cognitive/swallowing deficits secondary to right cerebellar and pontine infarcts which require 3+  hours per day of interdisciplinary therapy in a comprehensive inpatient rehab setting. Physiatrist is providing close team supervision and 24 hour management of active medical problems listed below. Physiatrist and rehab team continue to assess barriers to discharge/monitor patient progress toward functional and medical goals.  Function:  Bathing Bathing position   Position: Shower  Bathing parts Body parts bathed by patient: Right arm, Left arm, Chest, Abdomen, Right upper leg, Left upper leg, Right lower leg, Left lower leg, Front perineal area, Buttocks Body parts bathed by helper: Back  Bathing assist Assist Level: Supervision or verbal cues      Upper Body Dressing/Undressing Upper body dressing   What is the patient wearing?: Pull over shirt/dress     Pull over shirt/dress - Perfomed by patient: Thread/unthread right sleeve, Thread/unthread left sleeve, Put head through opening, Pull shirt over trunk          Upper body assist Assist Level: Supervision or verbal cues, Set up   Set up : To obtain clothing/put away  Lower Body Dressing/Undressing Lower body dressing   What is the patient wearing?: Pants, Non-skid slipper socks     Pants- Performed by patient: Thread/unthread right pants leg, Thread/unthread left pants leg, Pull pants up/down   Non-skid slipper socks- Performed by patient: Don/doff right sock, Don/doff left sock                    Lower body assist Assist for lower body dressing: Touching or steadying assistance (Pt > 75%)  Toileting Toileting Toileting activity did not occur: No continent bowel/bladder event Toileting steps completed by patient: Adjust clothing prior to toileting, Performs perineal hygiene, Adjust clothing after toileting (urination only) Toileting steps completed by helper: Performs perineal hygiene, Adjust clothing after toileting, Adjust clothing prior to toileting Katherine: Grab bar or rail  Toileting  assist Assist level: Touching or steadying assistance (Pt.75%)   Transfers Chair/bed transfer Chair/bed transfer activity did not occur: Safety/medical concerns   Chair/bed transfer assist level: Touching or steadying assistance (Pt > 75%) Chair/bed transfer assistive device: Armrests     Locomotion Ambulation     Max distance: 66ft  Assist level: Moderate assist (Pt 50 - 74%)   Wheelchair   Type: Manual Max wheelchair distance: 135 Assist Level: Supervision or verbal cues  Cognition Comprehension Comprehension assist level: Understands basic 50 - 74% of the time/ requires cueing 25 - 49% of the time  Expression Expression assist level: Expresses basic 75 - 89% of the time/requires cueing 10 - 24% of the time. Needs helper to occlude trach/needs to repeat words.  Social Interaction Social Interaction assist level: Interacts appropriately 75 - 89% of the time - Needs redirection for appropriate language or to initiate interaction.  Problem Solving Problem solving assist level: Solves basic 25 - 49% of the time - needs direction more than half the time to initiate, plan or complete simple activities  Memory Memory assist level: Recognizes or recalls less than 25% of the time/requires cueing greater than 75% of the time   Medical Problem List and Plan: 1. Balance, swallowing, communication and functional deficitssecondary to right cerebellar and pontine infarcts CIRPT, OT 2. DVT Prophylaxis/Anticoagulation: Pharmaceutical: Heparin 3. Pain Management: N/A 4. Mood: Has a positive outlook and supportive family. LCSW to follow for evaluation and support.  5. Neuropsych: This patient iscapable of making decisions on hisown behalf. 6. Skin/Wound Care: routine pressure relief measures.  7. Fluids/Electrolytes/Nutrition: Monitor I/O.    -encourage PO 8. HTN: improvement with increased norvasc  -continue norvasc at 10mg  daily  -prn clonidine  -avoid over-correction 9.  Malignant mesothelioma with recurrent pleural effusion: Lifespan of less than 6 months.  10. A fib: Off Eliquis--to be resumed  11. Hypokalemia: resolved with supplement.  12. T2DM with Hgb A1C-6.9: Fasting/random BS have ranged from 110 -130.     -dietary education  13. Dyslipidemia: On Crestor.    LOS (Days) 3 A FACE TO FACE EVALUATION WAS PERFORMED  Charlett Blake, MD 01/12/2017 8:38 AM

## 2017-01-13 ENCOUNTER — Inpatient Hospital Stay (HOSPITAL_COMMUNITY): Payer: Medicare Other | Admitting: Occupational Therapy

## 2017-01-13 ENCOUNTER — Inpatient Hospital Stay (HOSPITAL_COMMUNITY): Payer: Medicare Other | Admitting: Physical Therapy

## 2017-01-13 NOTE — Progress Notes (Signed)
Occupational Therapy Session Note  Patient Details  Name: Thomas [name] MRN: [id number] Date of Birth: June 03, 1926  Today's Date: 01/13/2017 OT Individual Time: 1300-1309 OT Individual Time Calculation (min): 9 min    Short Term Goals: Week 1:  OT Short Term Goal 1 (Week 1): Pt will stand with supervision to complete 2 grooming tasks at sink OT Short Term Goal 2 (Week 1): Pt will ambulate into bathroom with min A in prep for ADL task OT Short Term Goal 3 (Week 1): Pt will complete 3/3 toileting tasks with CGA  Skilled Therapeutic Interventions/Progress Updates: Upon approach for this session, nursing was assisting patient back to bed as he stated he was fatigued.    One of his dtr asked if he could forgo this therapy session and rest as she thought he was too fatigued to complete the number sessions scheduled for him today.  Nursing 'handed off' assisting patient to bed to this clinician.    Patient was able to complete just a few more minutes of endurance training and bed mobility practice for positioning before resting.      Patient was left in the care of his wife and room full of children after this bit of activity.       Therapy Documentation Precautions:  Precautions Precautions: Fall Restrictions Weight Bearing Restrictions: No General: General OT Amount of Missed Time: 21 Minutes (fatigue)   Pain:  denied     Vision   See Function Navigator for Current Functional Status.   Therapy/Group: Individual Therapy  Alfredia Ferguson [facility] 01/13/2017, 9:23 PM

## 2017-01-13 NOTE — Progress Notes (Signed)
Occupational Therapy Session Note  Patient Details  Name: Thomas [name] MRN: [id number] Date of Birth: Dec 19, 1925  Today's Date: 01/13/2017 OT Individual Time: 1621-1701 OT Individual Time Calculation (min): 40 min    skilled Therapeutic Interventions/Progress Updates: this session patient completed endurance training on NuStep 4 minutes level 1 to increase activitly tolerance for self care and IADLs;       toileting with Min A (He was able to pull down pants and wash to pericleanse with close S without loss of balance);       Change into pajama pants and gown with min A for balance due to fatigue,    W/c to bed transfer with close S and bed mobility with Min A and min verbal cues  Patient left with call bell and phone in place     Therapy Documentation Precautions: hard of hearing Precautions Precautions: Fall Restrictions Weight Bearing Restrictions: No  Pain:denied     See Function Navigator for Current Functional Status.   Therapy/Group: Individual Therapy  Alfredia Ferguson [facility] 01/13/2017, 9:34 PM

## 2017-01-13 NOTE — Progress Notes (Signed)
[facility] PHYSICAL MEDICINE & REHABILITATION     PROGRESS NOTE    Subjective/Complaints: .  Up in bed. No new complaints. Slept well.   ROS: pt denies nausea, vomiting, diarrhea, cough, shortness of breath or chest pain     Objective: Vital Signs: Blood pressure (!) 168/79, pulse 79, temperature 98.4 F (36.9 C), temperature source Oral, resp. rate 18, height 5\' 11"  (1.803 m), weight 74.8 kg (164 lb 12.8 oz), SpO2 98 %. No results found. No results for input(s): WBC, HGB, HCT, PLT in the last 72 hours. No results for input(s): NA, K, CL, GLUCOSE, BUN, CREATININE, CALCIUM in the last 72 hours.  Invalid input(s): CO CBG (last 3)  No results for input(s): GLUCAP in the last 72 hours.  Wt Readings from Last 3 Encounters:  01/12/17 74.8 kg (164 lb 12.8 oz)  01/05/17 72.6 kg (160 lb 0.9 oz)  12/22/16 75.3 kg (166 lb)    Physical Exam:  Constitutional: He is oriented to person, place, and time. He appears well-developedand well-nourished.  HENT:  Head: Normocephalicand atraumatic.  Mouth/Throat: Oropharynx is clear and moist.  Eyes: Conjunctivaeand EOMare normal. Pupils are equal, round, and reactive to light.  Neck: Normal range of motion. Neck supple.  Cardiovascular:  RRR  Respiratory:  Lungs clear Abd: soft/ nt Musculoskeletal: He exhibits no edemaor tenderness.  Neurological: He is alertand oriented to person, place, and time.  Dysarthric but intelligible  Able to follow simple commands without difficulty. Dysmetria with right finger to nose more than left. .  Sensory deficits RUE and RLE present.  Motor: 4+/5 throughout bilateral UE and LE's  Skin: Skin is warmand dry. No rashnoted. No erythema.  Psychiatric: He has a normal mood and affect. His behavior is normal. Thought contentnormal.   Assessment/Plan: 1. Right sided ataxia, balance and cognitive/swallowing deficits secondary to right cerebellar and pontine infarcts which require 3+ hours per day  of interdisciplinary therapy in a comprehensive inpatient rehab setting. Physiatrist is providing close team supervision and 24 hour management of active medical problems listed below. Physiatrist and rehab team continue to assess barriers to discharge/monitor patient progress toward functional and medical goals.  Function:  Bathing Bathing position   Position: Shower  Bathing parts Body parts bathed by patient: Right arm, Left arm, Chest, Abdomen, Right upper leg, Left upper leg, Right lower leg, Left lower leg, Front perineal area, Buttocks Body parts bathed by helper: Back  Bathing assist Assist Level: Touching or steadying assistance(Pt > 75%) (pt stood to wash bottom)      Upper Body Dressing/Undressing Upper body dressing   What is the patient wearing?: Pull over shirt/dress     Pull over shirt/dress - Perfomed by patient: Thread/unthread right sleeve, Thread/unthread left sleeve, Put head through opening, Pull shirt over trunk          Upper body assist Assist Level: Supervision or verbal cues, Set up   Set up : To obtain clothing/put away  Lower Body Dressing/Undressing Lower body dressing   What is the patient wearing?: Pants, Non-skid slipper socks, Shoes     Pants- Performed by patient: Thread/unthread right pants leg, Thread/unthread left pants leg, Pull pants up/down   Non-skid slipper socks- Performed by patient: Don/doff right sock, Don/doff left sock       Shoes - Performed by patient: Don/doff right shoe, Don/doff left shoe, Fasten right, Fasten left            Lower body assist Assist for lower body dressing: Touching  or steadying assistance (Pt > 75%)      Toileting Toileting Toileting activity did not occur: No continent bowel/bladder event Toileting steps completed by patient: Adjust clothing prior to toileting, Performs perineal hygiene, Adjust clothing after toileting (urination only) Toileting steps completed by helper: Performs perineal  hygiene, Adjust clothing after toileting, Adjust clothing prior to toileting [place]: Grab bar or rail  Toileting assist Assist level: Touching or steadying assistance (Pt.75%)   Transfers Chair/bed transfer Chair/bed transfer activity did not occur: Safety/medical concerns Chair/bed transfer method: Squat pivot Chair/bed transfer assist level: Touching or steadying assistance (Pt > 75%) Chair/bed transfer assistive device: Armrests     Locomotion Ambulation     Max distance: 33ft  Assist level: Moderate assist (Pt 50 - 74%)   Wheelchair   Type: Manual Max wheelchair distance: 162ft  Assist Level: Supervision or verbal cues  Cognition Comprehension Comprehension assist level: Understands basic 75 - 89% of the time/ requires cueing 10 - 24% of the time  Expression Expression assist level: Expresses basic 50 - 74% of the time/requires cueing 25 - 49% of the time. Needs to repeat parts of sentences.  Social Interaction Social Interaction assist level: Interacts appropriately 75 - 89% of the time - Needs redirection for appropriate language or to initiate interaction.  Problem Solving Problem solving assist level: Solves basic 50 - 74% of the time/requires cueing 25 - 49% of the time  Memory Memory assist level: Recognizes or recalls 25 - 49% of the time/requires cueing 50 - 75% of the time   Medical Problem List and Plan: 1. Balance, swallowing, communication and functional deficitssecondary to right cerebellar and pontine infarcts CIRPT, OT---continue 2. DVT Prophylaxis/Anticoagulation: Pharmaceutical: Heparin 3. Pain Management: N/A 4. Mood: Has a positive outlook and supportive family. LCSW to follow for evaluation and support.  5. Neuropsych: This patient iscapable of making decisions on hisown behalf. 6. Skin/Wound Care: routine pressure relief measures.  7. Fluids/Electrolytes/Nutrition: Monitor I/O.    -encourage PO 8. HTN: improvement  with increased norvasc  -continue norvasc at 10mg  daily  -prn clonidine 9. Malignant mesothelioma with recurrent pleural effusion: Lifespan of less than 6 months.  10. A fib: Off Eliquis--to be resumed  11. Hypokalemia: resolved with supplement.  12. T2DM with Hgb A1C-6.9: Fasting/random BS have ranged from 110 -130.     -dietary education  13. Dyslipidemia: On Crestor.    LOS (Days) 4 A Byron T, MD 01/13/2017 9:19 AM

## 2017-01-13 NOTE — Progress Notes (Signed)
Physical Therapy Session Note  Patient Details  Name: Thomas [name] MRN: [id number] Date of Birth: 1926/07/02  Today's Date: 01/13/2017 PT Individual Time: 1120-1201 AND 1417-1315 PT Individual Time Calculation (min): 41 min AND 58 min   Short Term Goals: Week 1:  PT Short Term Goal 1 (Week 1): pt will transfer with min assist consistently PT Short Term Goal 2 (Week 1): Pt will ambulate 141f with min assist  PT Short Term Goal 3 (Week 1): Pt will propel WC 1532fwith supervision assist PT Short Term Goal 4 (Week 1): Pt will maintain dynamic balance with min assist to prevent Lateral LOB to the R.   Skilled Therapeutic Interventions/Progress Updates:   Pt received supine in bed and agreeable to PT. Supine>sit transfer with supervision assist and only min cues for safety. Squat pivot transfer to L with min assist from PT. Pt instructed pt in semi-squat position with R and L lateral reach. Gait in parallel bars with visual cues for L lateral weight shift and improved RLE positioning to prevent R lateral LOB. PT applied Shoe lift to R LE and noted increased R LOB, similar results with lift on the LLE. Gait with RW following gait in Parallel bars with noticeable improvement L lateral weight shift.   Patient returned too room and [facility] reach and all needs met.  '  Through treatment pt BP remained <173/86.   Session 2.  Pt received sitting in WC and agreeable to PT  PT transported pt to hall by rehab gym. Gait training instructed by PT with use of rail in hall 3x2040forward and 1x20f32fckward with LUE support to facilitate increased weight shift through the LLE. HW x 20ft66fh mod assist and max cues for sequencing.  Gait with RW and 10lb weight on the L side x 154ft 22f intermittent mod-min assist from PT. Moderate-max cues for imrpoved wieght shift to the L with significantly improved technique. Pt noted to also have increased step width and improve BOS.  WC  mobility training instructed by PT x 150ft w97fsupervision assist from PT. Min cues for turning technique and obstacle negotiation.  Patient returned too room and [facility] and all needs met.         Therapy Documentation Precautions:  Precautions Precautions: Fall Restrictions Weight Bearing Restrictions: No Pain:   0/10   See Function Navigator for Current Functional Status.   Therapy/Group: Individual Therapy  Thomas Haberl Lorie Phenix18, 1:01 PM

## 2017-01-13 NOTE — Progress Notes (Signed)
Occupational Therapy Session Note  Patient Details  Name: Thomas [name] MRN: [id number] Date of Birth: Oct 31, 1925  Today's Date: 01/13/2017 OT Individual Time: 4888-9169 OT Individual Time Calculation (min): 60 min    Short Term Goals: Week 1:  OT Short Term Goal 1 (Week 1): Pt will stand with supervision to complete 2 grooming tasks at sink OT Short Term Goal 2 (Week 1): Pt will ambulate into bathroom with min A in prep for ADL task OT Short Term Goal 3 (Week 1): Pt will complete 3/3 toileting tasks with CGA  Skilled Therapeutic Interventions/Progress Updates: Patient participated in bathing and dressing skilled therapy with focus on safe, neutral postural alignment and staying within walker during functional mobility as follows:  Patient hard of hearing per dtr [facility] and she requested this clinician speak loudly.  He was able to transfer supine to EOB with supervision.   He was able to sit EOB dlynamically with neutral alignment and fair+ dynamic sitting balance for approximately 5 minutes while he alternated crossing leg over knee to don tredded socks - appropriate bilateral hip flexion for this task  Edge of bed to walker transfer via RW and to tub transfer bench via grab bars= Min A as patient required tactile cues and physical assist and constant verbal reminders to stay within his walk for safe use.....  UB & lower body bathing and dressing=setup            Patient was able to sit dynamically on the tub transfer bench without right lateral leans (reported by other OT clinician on yesterday).    He was able to cross one leg at t time to wash feet;       He was able to complete sit -stand safely and maintain balance and correct postural alignmrent to stand and hold to grab bar with one hand and wash his periaarea and buttocks with the other (CGA from this clinician for safety)  Patient required assist for brief but was able to don pants and socks with CGA sit to stand.  He was able to  stand at sink with his dtr for approximately 5 minutes with CGA without loss of  Balance to complete oral care.   Due to fatigue afterwards he sat while his dtr combed his hair.  Patient left in the supportive care of his grand dtr and dtr at the end of the session.     Therapy Documentation Precautions:  Patient Hard of Hearing Precautions Precautions: Fall Restrictions Weight Bearing Restrictions: No Pain:denied    Therapy/Group: Individual Therapy  Alfredia Ferguson [facility] 01/13/2017, 8:50 PM

## 2017-01-14 ENCOUNTER — Inpatient Hospital Stay (HOSPITAL_COMMUNITY): Payer: Medicare Other | Admitting: Occupational Therapy

## 2017-01-14 NOTE — Progress Notes (Signed)
Social Work Assessment and Plan  Patient Details  Name: Thomas [name] MRN: [id number] Date of Birth: 05-Sep-1925  Today's Date: 01/11/2017  Problem List:  Patient Active Problem List   Diagnosis Date Noted  . Stroke due to embolism of right posterior cerebral artery ([place]) 01/09/2017  . Ataxia   . Benign essential HTN   . Esophageal stricture   . PAF (paroxysmal atrial fibrillation) ([place])   . Recurrent left pleural effusion   . Dysphagia, post-stroke   . Type 2 diabetes mellitus with circulatory disorder ([place])   . Acute blood loss anemia   . Mesothelioma of left lung ([place])   . Palliative care by specialist   . Stroke due to embolism of posterior inferior cerebral artery ([place]) d/t AF 01/05/2017  . Goals of care, counseling/discussion 12/23/2016  . Encounter for antineoplastic chemotherapy 12/23/2016  . Vertigo following cerebrovascular accident 12/16/2016  . Hyperkalemia 12/16/2016  . Type 2 diabetes mellitus with hyperlipidemia ([place]) 12/16/2016  . Insomnia 12/07/2016  . Atelectasis 10/30/2016  . Alcohol abuse 08/04/2016  . Permanent atrial fibrillation ([place]) 07/21/2016  . Malignant mesothelioma of pleura ([place]) 02/16/2016  . Perforation of right tympanic membrane 11/24/2013  . Carotid stenosis 02/21/2013  . TIA (transient ischemic attack) 02/17/2013  . Hypokalemia 11/26/2012  . GERD (gastroesophageal reflux disease) 05/17/2011  . Dysphagia 05/17/2011  . Stricture and stenosis of esophagus 08/29/2010  . Allergic rhinitis 01/16/2008  . 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 PROSTATE CANCER, HX OF 03/26/2007  . Hyperlipidemia 03/20/2007  . Essential hypertension 03/20/2007   Past Medical History:  Past Medical History:  Diagnosis Date  . Adenomatous colon polyp 2009  . Allergy   . Carotid artery occlusion   . Diverticulosis of colon (without mention of hemorrhage)   . ED (erectile dysfunction)   . Esophageal stricture   . Esophagitis   . GERD (gastroesophageal  reflux disease)   . Goals of care, counseling/discussion 12/23/2016  . Hiatal hernia   . Hypertension   . Prostate cancer ([place])   . Unspecified gastritis and gastroduodenitis without mention of hemorrhage    Past Surgical History:  Past Surgical History:  Procedure Laterality Date  . ENDARTERECTOMY Left 03/05/2013   Procedure: ENDARTERECTOMY CAROTID;  Surgeon: Conrad Dupont, MD;  Location: [facility];  Service: Vascular;  Laterality: Left;  . ESOPHAGOGASTRODUODENOSCOPY (EGD) WITH ESOPHAGEAL DILATION    . PROSTATE SURGERY     Laser surgery   Social History:  reports that he quit smoking about 63 years ago. His smoking use included Cigarettes. He has a 2.00 pack-year smoking history. He has never used smokeless tobacco. He reports that he drinks alcohol. He reports that he does not use drugs.  Family / Support Systems Marital Status: Married How Long?: 65 years Patient Roles: Spouse, Parent, Other (Comment) (church member/volunteer) Spouse/Significant Other: Syrus Nakama - wife - [phone] Children: Patriciaann Clan - dtr - [phone]; Arnette Norris, Brooke Bonito. - son - [phone]; Rosario Adie - dtr - [phone] Other Supports: church family Anticipated Caregiver: children Ability/Limitations of Caregiver: wife 81 years old and uses RW - CHF, children very involved Caregiver Availability: 24/7 Family Dynamics: close, supportive family  Social History Preferred language: English Religion: Christian Read: Yes Write: Yes Employment Status: Retired Date Retired/Disabled/Unemployed: 24 Age Retired: 20 Public relations account executive Issues: none reported Guardian/Conservator: N/A - MD has determined that pt is capable of making his own decisions.   Abuse/Neglect Physical Abuse: Denies Verbal Abuse: Denies Sexual Abuse: Denies Exploitation of  patient/patient's resources: Denies Self-Neglect: Denies  Emotional Status Pt's affect, behavior and adjustment status: Pt is motivated  and positive about his recovery.  He has great family support and this keeps him going. Recent Psychosocial Issues: Pt's wife struggles with CHF and children are trying to help both parents.  Pt has been seen by Palliative Care Team to help establish goals of care. Psychiatric History: none reported Substance Abuse History: none reported  Patient / Family Perceptions, Expectations & Goals Pt/Family understanding of illness & functional limitations: Pt and dtrs report a good understanding of pt's condition and need for supervisison when he returns home. Premorbid pt/family roles/activities: Pt enjoys spending time with his family and helping at the church. Anticipated changes in roles/activities/participation: Pt knows he will not be able to drive people from church around town, but still hopes to volunteer in some capacity. Pt/family expectations/goals: Pt wants to get home to his wife and family.  Community [employer] Agencies: None Premorbid Home Care/DME Agencies: Other (Comment) (Pt has a rolling walker and wife has some DME at home.) Transportation available at discharge: Pt's children. Resource referrals recommended: Neuropsychology, Support group (specify) (Stroke support group and hospice support groups)  Discharge Planning Living Arrangements: Spouse/significant other, Children Support Systems: Spouse/significant other, Children, Other relatives Type of Residence: Private residence Insurance Resources: Medicare ([employer]) Financial Resources: Radio broadcast assistant Screen Referred: No Money Management: Patient, Family Does the patient have any problems obtaining your medications?: No Home Management: Pt's son assists with this. Patient/Family Preliminary Plans: Pt's children plan to be with pt and his wife 24/7. Barriers to Discharge: Steps Social Work Anticipated Follow Up Needs: HH/OP Expected length of stay: 7 to 14 days  Clinical Impression CSW met pt to  introduce self and role of CSW, as well as to complete assessment.  Spoke with pt's two dtr's via telephone to introduce self.  They are pt's POAs, also.  Pt has good family support and children are taking care of pt's wife while pt is in CIR and they will care of pt at home, too.  CSW explained that CSW will support pt while on CIR and also with d/c planning.  Dtrs are wondering if hospice or [place] will be more appropriate for pt after d/c.  CSW explained that we will look into pt's options and get him the best care for his situation when he goes home with their input.  They felt this was a good idea.  CSW will continue to follow and assist as needed.  Dyshawn Cangelosi, Silvestre Mesi 01/13/2017, 12:11 AM

## 2017-01-14 NOTE — Progress Notes (Signed)
Patient transferred himself to bed from recliner without calling for assistance. Educated patient, but patient unable to remember safety measures. Altered safety sheet and will discuss with oncoming RN and therapy.

## 2017-01-14 NOTE — Progress Notes (Signed)
Patient requiring frequent reorientation as to where he is and why. Played music for patient for one hour, requested Nat Maryjo [place]. Reported enjoying music. Did not attempt to get out of bed for duration of music intervention, but did request reorientation of NT twice when he saw her walking past.

## 2017-01-14 NOTE — Progress Notes (Signed)
[place] PHYSICAL MEDICINE & REHABILITATION     PROGRESS NOTE    Subjective/Complaints: .  Up eating breakfast with OT. No new complaints. Concerned about wife (just that she's home alone)  ROS: pt denies nausea, vomiting, diarrhea, cough, shortness of breath or chest pain    Objective: Vital Signs: Blood pressure (!) 161/98, pulse 77, temperature 98.2 F (36.8 C), temperature source Oral, resp. rate 18, height 5\' 11"  (1.803 m), weight 74.8 kg (164 lb 12.8 oz), SpO2 99 %. No results found. No results for input(s): WBC, HGB, HCT, PLT in the last 72 hours. No results for input(s): NA, K, CL, GLUCOSE, BUN, CREATININE, CALCIUM in the last 72 hours.  Invalid input(s): CO CBG (last 3)  No results for input(s): GLUCAP in the last 72 hours.  Wt Readings from Last 3 Encounters:  01/12/17 74.8 kg (164 lb 12.8 oz)  01/05/17 72.6 kg (160 lb 0.9 oz)  12/22/16 75.3 kg (166 lb)    Physical Exam:  Constitutional: He is oriented to person, place, and time. He appears well-developedand well-nourished.  HENT:  Head: Normocephalicand atraumatic.  Mouth/Throat: Oropharynx is clear and moist.  Eyes: Conjunctivaeand EOMare normal. Pupils are equal, round, and reactive to light.  Neck: Normal range of motion. Neck supple.  Cardiovascular:  RRR  Respiratory:  Lungs clear, normal effort Abd: soft/ nt Musculoskeletal: He exhibits no edemaor tenderness.  Neurological: He is alertand oriented to person, place, and time.  Dysarthric but intelligible  Able to follow simple commands without difficulty. Dysmetria with right finger to nose more than left. Did a nice job of feeding self with right hand Sensory deficits RUE and RLE present.  Motor: 4+/5 throughout bilateral UE and LE's  Skin: Skin is warmand dry. No rashnoted. No erythema.  Psychiatric: He has a normal mood and affect. His behavior is normal. Thought contentnormal.   Assessment/Plan: 1. Right sided ataxia, balance and  cognitive/swallowing deficits secondary to right cerebellar and pontine infarcts which require 3+ hours per day of interdisciplinary therapy in a comprehensive inpatient rehab setting. Physiatrist is providing close team supervision and 24 hour management of active medical problems listed below. Physiatrist and rehab team continue to assess barriers to discharge/monitor patient progress toward functional and medical goals.  Function:  Bathing Bathing position Bathing activity did not occur: Refused Position: Production manager parts bathed by patient: Right arm, Left arm, Chest, Abdomen, Right upper leg, Left upper leg, Right lower leg, Left lower leg, Front perineal area, Buttocks Body parts bathed by helper: Back  Bathing assist Assist Level: Touching or steadying assistance(Pt > 75%) (pt stood to wash bottom)      Upper Body Dressing/Undressing Upper body dressing   What is the patient wearing?: Pull over shirt/dress     Pull over shirt/dress - Perfomed by patient: Thread/unthread right sleeve, Thread/unthread left sleeve, Put head through opening, Pull shirt over trunk          Upper body assist Assist Level: Supervision or verbal cues   Set up : To obtain clothing/put away  Lower Body Dressing/Undressing Lower body dressing   What is the patient wearing?: Pants     Pants- Performed by patient: Thread/unthread right pants leg, Thread/unthread left pants leg, Pull pants up/down   Non-skid slipper socks- Performed by patient: Don/doff right sock, Don/doff left sock       Shoes - Performed by patient: Don/doff right shoe, Don/doff left shoe, Fasten right, Fasten left  Lower body assist Assist for lower body dressing: Touching or steadying assistance (Pt > 75%)      Toileting Toileting Toileting activity did not occur: No continent bowel/bladder event Toileting steps completed by patient: Adjust clothing prior to toileting, Performs perineal hygiene,  Adjust clothing after toileting Toileting steps completed by helper: Adjust clothing prior to toileting, Performs perineal hygiene, Adjust clothing after toileting Toileting Assistive Devices: Grab bar or rail  Toileting assist Assist level: Touching or steadying assistance (Pt.75%)   Transfers Chair/bed transfer Chair/bed transfer activity did not occur: Safety/medical concerns Chair/bed transfer method: Squat pivot Chair/bed transfer assist level: Touching or steadying assistance (Pt > 75%) Chair/bed transfer assistive device: Armrests     Locomotion Ambulation     Max distance: 173ft Assist level: Moderate assist (Pt 50 - 74%)   Wheelchair   Type: Manual Max wheelchair distance: 135ft  Assist Level: Supervision or verbal cues  Cognition Comprehension Comprehension assist level: Understands basic 90% of the time/cues < 10% of the time  Expression Expression assist level: Expresses basic 75 - 89% of the time/requires cueing 10 - 24% of the time. Needs helper to occlude trach/needs to repeat words.  Social Interaction Social Interaction assist level: Interacts appropriately 75 - 89% of the time - Needs redirection for appropriate language or to initiate interaction.  Problem Solving Problem solving assist level: Solves basic 75 - 89% of the time/requires cueing 10 - 24% of the time  Memory Memory assist level: Recognizes or recalls 75 - 89% of the time/requires cueing 10 - 24% of the time   Medical Problem List and Plan: 1. Balance, swallowing, communication and functional deficitssecondary to right cerebellar and pontine infarcts CIRPT, OT continue 2. DVT Prophylaxis/Anticoagulation: Pharmaceutical: Heparin 3. Pain Management: N/A 4. Mood: Has a positive outlook and supportive family. LCSW to follow for evaluation and support.  5. Neuropsych: This patient iscapable of making decisions on hisown behalf. 6. Skin/Wound Care: routine pressure relief measures.  7.  Fluids/Electrolytes/Nutrition: Monitor I/O.    -encourage PO 8. HTN: improvement with increased norvasc. However some increase over last 12 hours  -continue norvasc at 10mg  daily an d monitor for further pattern  -prn clonidine for severe spikes 9. Malignant mesothelioma with recurrent pleural effusion: Lifespan of less than 6 months.  10. A fib: Off Eliquis--to be resumed  11. Hypokalemia: resolved with supplement.  12. T2DM with Hgb A1C-6.9: Fasting/random BS have ranged from 110 -130.     -dietary education  13. Dyslipidemia: On Crestor.    LOS (Days) 5 A FACE TO FACE EVALUATION WAS PERFORMED  Meredith Staggers, MD 01/14/2017 9:26 AM

## 2017-01-14 NOTE — Plan of Care (Signed)
Problem: RH SAFETY Goal: RH STG DECREASED RISK OF FALL WITH ASSISTANCE STG Decreased Risk of Fall With mod  Assistance.  Outcome: Not Progressing Unable to remember safety plan

## 2017-01-14 NOTE — Progress Notes (Signed)
Occupational Therapy Session Note  Patient Details  Name: Thomas [name] MRN: [id number] Date of Birth: 01/23/1926  Today's Date: 01/14/2017 OT Individual Time: 0700-0800 OT Individual Time Calculation (min): 60 min    Short Term Goals: Week 1:  OT Short Term Goal 1 (Week 1): Pt will stand with supervision to complete 2 grooming tasks at sink OT Short Term Goal 2 (Week 1): Pt will ambulate into bathroom with min A in prep for ADL task OT Short Term Goal 3 (Week 1): Pt will complete 3/3 toileting tasks with CGA  Skilled Therapeutic Interventions/Progress Updates:    Pt seen for OT session focusing on ADL re-training and functional rpoblem solving. Pt in supine upon arrival, confused and asking questions about therapy as he said "I got therapy yesterday, how long am I going to be here and keep getting therapy". Re-oriented pt to IPR, reason he was in the hospital, and possible time fram from therapy. Pt with the same questions throughout session, having to be re-oriented multiple times. He was disoriented to month and year.  He ambulated in room with hand held assist to gather clothing items. He declined bathing this morning. He dressed seated EOB with supervision and slightly increased time for clothing orientation. Grooming completed standing at sink, pt with R lean he was unaware of despite visual feeback from mirror and VCs for midline orientation. Pt taken to therapy gym total A for time and energy conservation. BP taken after stand pivot to mat, 150-92. Attempted pipe tree activity in standing, pt with very poor frustration tolerance when having difficulty with simple pipe tree activity therefore transitioned to completing task from seated position. From seated position, pt cont to require mod-max cuing for problem solving in order to replicate pattern from picture. He completed second pattern with mod cuing, again though with poor frustration tolerance, wanting to quit task every time he came  to next piece to be placed. He self propelled w/c back to room at end of session with min cuing for w/c propulsion techniques. He ate breakfast from unsupported seating position with supervision, incorporated R UE into task at dominant level mod I. Pt left seated in recliner at end of session, chair alarm on and all needs in reach.   Therapy Documentation Precautions:  Precautions Precautions: Fall Restrictions Weight Bearing Restrictions: No Pain:    See Function Navigator for Current Functional Status.   Therapy/Group: Individual Therapy  Lewis, Gizel Riedlinger C 01/14/2017, 6:43 AM

## 2017-01-15 ENCOUNTER — Inpatient Hospital Stay (HOSPITAL_COMMUNITY): Payer: Medicare Other | Admitting: Speech Pathology

## 2017-01-15 ENCOUNTER — Telehealth: Payer: Self-pay | Admitting: Internal Medicine

## 2017-01-15 ENCOUNTER — Inpatient Hospital Stay (HOSPITAL_COMMUNITY): Payer: Medicare Other | Admitting: Occupational Therapy

## 2017-01-15 ENCOUNTER — Inpatient Hospital Stay (HOSPITAL_COMMUNITY): Payer: Medicare Other | Admitting: Physical Therapy

## 2017-01-15 LAB — CBC WITH DIFFERENTIAL/PLATELET
BASOS ABS: 0 10*3/uL (ref 0.0–0.1)
Basophils Relative: 0 %
Eosinophils Absolute: 0.1 10*3/uL (ref 0.0–0.7)
Eosinophils Relative: 1 %
HEMATOCRIT: 42.7 % (ref 39.0–52.0)
Hemoglobin: 13.4 g/dL (ref 13.0–17.0)
LYMPHS ABS: 1.2 10*3/uL (ref 0.7–4.0)
Lymphocytes Relative: 20 %
MCH: 28.1 pg (ref 26.0–34.0)
MCHC: 31.4 g/dL (ref 30.0–36.0)
MCV: 89.5 fL (ref 78.0–100.0)
MONOS PCT: 15 %
Monocytes Absolute: 0.9 10*3/uL (ref 0.1–1.0)
NEUTROS PCT: 64 %
Neutro Abs: 3.9 10*3/uL (ref 1.7–7.7)
PLATELETS: 334 10*3/uL (ref 150–400)
RBC: 4.77 MIL/uL (ref 4.22–5.81)
RDW: 16.3 % — AB (ref 11.5–15.5)
WBC: 6.1 10*3/uL (ref 4.0–10.5)

## 2017-01-15 LAB — URINALYSIS, ROUTINE W REFLEX MICROSCOPIC
Bilirubin Urine: NEGATIVE
Glucose, UA: NEGATIVE mg/dL
Ketones, ur: 15 mg/dL — AB
NITRITE: POSITIVE — AB
Protein, ur: 100 mg/dL — AB
Specific Gravity, Urine: 1.02 (ref 1.005–1.030)
pH: 6.5 (ref 5.0–8.0)

## 2017-01-15 LAB — URINALYSIS, MICROSCOPIC (REFLEX)

## 2017-01-15 MED ORDER — SULFAMETHOXAZOLE-TRIMETHOPRIM 400-80 MG PO TABS
1.0000 | ORAL_TABLET | Freq: Two times a day (BID) | ORAL | Status: DC
  Administered 2017-01-15 – 2017-01-18 (×6): 1 via ORAL
  Filled 2017-01-15 (×6): qty 1

## 2017-01-15 MED ORDER — SULFAMETHOXAZOLE-TRIMETHOPRIM 400-80 MG PO TABS: 1.0000 | ORAL_TABLET | Freq: Two times a day (BID) | ORAL | Status: DC

## 2017-01-15 MED ORDER — METOPROLOL TARTRATE 12.5 MG HALF TABLET
12.5000 mg | ORAL_TABLET | Freq: Two times a day (BID) | ORAL | Status: DC
  Administered 2017-01-15 – 2017-01-18 (×7): 12.5 mg via ORAL
  Filled 2017-01-15 (×7): qty 1

## 2017-01-15 NOTE — Progress Notes (Signed)
[place] PHYSICAL MEDICINE & REHABILITATION     PROGRESS NOTE    Subjective/Complaints: .  No issues overnite, interested in outpt PT, OT   ROS: pt denies nausea, vomiting, diarrhea, cough, shortness of breath or chest pain    Objective: Vital Signs: Blood pressure (!) 166/91, pulse 74, temperature 98 F (36.7 C), temperature source Oral, resp. rate 19, height 5\' 11"  (1.803 m), weight 74.8 kg (164 lb 12.8 oz), SpO2 98 %. No results found. No results for input(s): WBC, HGB, HCT, PLT in the last 72 hours. No results for input(s): NA, K, CL, GLUCOSE, BUN, CREATININE, CALCIUM in the last 72 hours.  Invalid input(s): CO CBG (last 3)  No results for input(s): GLUCAP in the last 72 hours.  Wt Readings from Last 3 Encounters:  01/12/17 74.8 kg (164 lb 12.8 oz)  01/05/17 72.6 kg (160 lb 0.9 oz)  12/22/16 75.3 kg (166 lb)    Physical Exam:  Constitutional: He is oriented to person, place, and time. He appears well-developedand well-nourished.  HENT:  Head: Normocephalicand atraumatic.  Mouth/Throat: Oropharynx is clear and moist.  Eyes: Conjunctivaeand EOMare normal. Pupils are equal, round, and reactive to light.  Neck: Normal range of motion. Neck supple.  Cardiovascular:  RRR  Respiratory:  Lungs clear, normal effort Abd: soft/ nt Musculoskeletal: He exhibits no edemaor tenderness.  Neurological: He is alertand oriented to person, place, and time.  Dysarthric but intelligible  Able to follow simple commands without difficulty. Dysmetria with right finger to nose more than left. Did a nice job of feeding self with right hand Sensory deficits RUE and RLE present.  Motor: 4+/5 throughout bilateral UE and LE's  Skin: Skin is warmand dry. No rashnoted. No erythema.  Psychiatric: He has a normal mood and affect. His behavior is normal. Thought contentnormal.   Assessment/Plan: 1. Right sided ataxia, balance and cognitive/swallowing deficits secondary to right  cerebellar and pontine infarcts which require 3+ hours per day of interdisciplinary therapy in a comprehensive inpatient rehab setting. Physiatrist is providing close team supervision and 24 hour management of active medical problems listed below. Physiatrist and rehab team continue to assess barriers to discharge/monitor patient progress toward functional and medical goals.  Function:  Bathing Bathing position Bathing activity did not occur: Refused Position: Production manager parts bathed by patient: Right arm, Left arm, Chest, Abdomen, Right upper leg, Left upper leg, Right lower leg, Left lower leg, Front perineal area, Buttocks Body parts bathed by helper: Back  Bathing assist Assist Level: Touching or steadying assistance(Pt > 75%) (pt stood to wash bottom)      Upper Body Dressing/Undressing Upper body dressing   What is the patient wearing?: Pull over shirt/dress     Pull over shirt/dress - Perfomed by patient: Thread/unthread right sleeve, Thread/unthread left sleeve, Put head through opening, Pull shirt over trunk          Upper body assist Assist Level: Supervision or verbal cues   Set up : To obtain clothing/put away  Lower Body Dressing/Undressing Lower body dressing   What is the patient wearing?: Pants     Pants- Performed by patient: Thread/unthread right pants leg, Thread/unthread left pants leg, Pull pants up/down   Non-skid slipper socks- Performed by patient: Don/doff right sock, Don/doff left sock       Shoes - Performed by patient: Don/doff right shoe, Don/doff left shoe, Fasten right, Fasten left            Lower body  assist Assist for lower body dressing: Touching or steadying assistance (Pt > 75%)      Toileting Toileting Toileting activity did not occur: No continent bowel/bladder event Toileting steps completed by patient: Adjust clothing prior to toileting, Performs perineal hygiene, Adjust clothing after toileting Toileting steps  completed by helper: Adjust clothing prior to toileting, Performs perineal hygiene, Adjust clothing after toileting Toileting Assistive Devices: Grab bar or rail  Toileting assist Assist level: Touching or steadying assistance (Pt.75%)   Transfers Chair/bed transfer Chair/bed transfer activity did not occur: Safety/medical concerns Chair/bed transfer method: Squat pivot Chair/bed transfer assist level: Touching or steadying assistance (Pt > 75%) Chair/bed transfer assistive device: Armrests     Locomotion Ambulation     Max distance: 196ft Assist level: Moderate assist (Pt 50 - 74%)   Wheelchair   Type: Manual Max wheelchair distance: 127ft  Assist Level: Supervision or verbal cues  Cognition Comprehension Comprehension assist level: Understands basic 90% of the time/cues < 10% of the time  Expression Expression assist level: Expresses basic 75 - 89% of the time/requires cueing 10 - 24% of the time. Needs helper to occlude trach/needs to repeat words.  Social Interaction Social Interaction assist level: Interacts appropriately 75 - 89% of the time - Needs redirection for appropriate language or to initiate interaction.  Problem Solving Problem solving assist level: Solves basic 75 - 89% of the time/requires cueing 10 - 24% of the time  Memory Memory assist level: Recognizes or recalls 75 - 89% of the time/requires cueing 10 - 24% of the time   Medical Problem List and Plan: 1. Balance, swallowing, communication and functional deficitssecondary to right cerebellar and pontine infarcts CIRPT, OT working on balance as primary issue 2. DVT Prophylaxis/Anticoagulation: Pharmaceutical: Heparin 3. Pain Management: N/A 4. Mood: Has a positive outlook and supportive family. LCSW to follow for evaluation and support. Daughter to visit today per pt 5. Neuropsych: This patient iscapable of making decisions on hisown behalf. 6. Skin/Wound Care: routine pressure relief measures.   7. Fluids/Electrolytes/Nutrition: Monitor I/O.    -encourage PO 8. HTN: improvement with increased norvasc. However some increase over last 12 hours  -continue norvasc at 10mg  daily an d monitor for further pattern  -prn clonidine for severe spikes 9. Malignant mesothelioma with recurrent pleural effusion: Lifespan of less than 6 months.  10. A fib: Off Eliquis--to be resumed2 wks post CVA ~ June 8 11. Hypokalemia: resolved with supplement.  12. T2DM with Hgb A1C-6.9: Fasting/random BS have ranged from 110 -130.     -dietary education  13. Dyslipidemia: On Crestor.    LOS (Days) 6 A FACE TO FACE EVALUATION WAS PERFORMED  Charlett Blake, MD 01/15/2017 8:01 AM

## 2017-01-15 NOTE — Progress Notes (Signed)
Physical Therapy Session Note  Patient Details  Name: Thomas [name] MRN: [id number] Date of Birth: Jun 09, 1926  Today's Date: 01/15/2017 PT Individual Time: 1505-1550 PT Individual Time Calculation (min): 45 min (make up session)  Short Term Goals: Week 1:  PT Short Term Goal 1 (Week 1): pt will transfer with min assist consistently PT Short Term Goal 2 (Week 1): Pt will ambulate 137f with min assist  PT Short Term Goal 3 (Week 1): Pt will propel WC 1575fwith supervision assist PT Short Term Goal 4 (Week 1): Pt will maintain dynamic balance with min assist to prevent Lateral LOB to the R.   Skilled Therapeutic Interventions/Progress Updates:    no c/o pain, pt asleep on arrival but easily awakes.  Session focus on activity tolerance and midline orientation during functional mobility.    Pt transitions to EOB with supervision and dons shoes with increased time and therapist assist to steady shoe and fasten.  Squat/pivot to and from w/c throughout session with close supervision.    Gait training with rail in hallway 2x30' focus on maintaining midline orientation and normal BOS with overall min assist and mod verbal cues for weight shift L, upright posture, and LE positioning.  Gait training x30' with L HHA + 3052with RW with mod assist and min verbal cues for foot placement and walker positioning.    Kinetron 3x1 minute at 30 cm/s from w/c position for activity tolerance, LE strengthening, and reciprocal stepping pattern.  Pt returned to bed at end of session and positioned with call bell in reach and needs met.   Therapy Documentation Precautions:  Precautions Precautions: Fall Restrictions Weight Bearing Restrictions: No   See Function Navigator for Current Functional Status.   Therapy/Group: Individual Therapy  Thomas Conroyenven-Crew 01/15/2017, 4:02 PM

## 2017-01-15 NOTE — Progress Notes (Signed)
Occupational Therapy Session Note  Patient Details  Name: Thomas [name] MRN: [id number] Date of Birth: November 29, 1925  Today's Date: 01/15/2017 OT Individual Time: 3300-7622 OT Individual Time Calculation (min): 75 min    Short Term Goals: Week 1:  OT Short Term Goal 1 (Week 1): Pt will stand with supervision to complete 2 grooming tasks at sink OT Short Term Goal 2 (Week 1): Pt will ambulate into bathroom with min A in prep for ADL task OT Short Term Goal 3 (Week 1): Pt will complete 3/3 toileting tasks with CGA  Skilled Therapeutic Interventions/Progress Updates:    Pt seen for OT ADL bathing/dressing session. Pt in supine upon arrival eating breakfast with daughter present. Pt re-oriented to therapy unit, he was able to recall "having therapy several days ago", unable to recall session with this therapist yesterday. He ambulated throughout session with HHA, pt reaching for furniture to stabilize self with despite cuing for upright posture and control. He bathed seated on tub transfer bench with supervision, lateral lean for buttock hygiene. He returned toilet to dress, requiring increased time for problem solving clothing orientation.  Following seated rest break, pt stood to complete grooming tasks, intially required min-mod A for standing balance due to R lean despite visual feedback of mirror and VCs for midline orientation as well as VCs for foot placement of wide BOS. He self propelled w/c to [facility] gym with supervision for UE strengthening/ endurance. He completed x1 round seated [employer] activity focusing on attention to task and R/L awareness. Completed with increased time with pt able to attend to all visual fields. With encouragement, pt willing to attempt standing trial. Pt with initial difficulty obtaining upright standing balance and therefore he returned himself to w/c and refused standing tasks. Pt displays more awareness of deficits and "just wanting to give up", however, all other  cognition seems to have declined in recent days. RN and daughter made aware, plans for urinaylsis today. Pt returned to room in same manner as described above, left seated in w/c with daughter present.  Discussed with pt's daughter pt's progress, OT/PT goals, and 24 hr supervision assist at d/c. [name] voiced understanding.   Therapy Documentation Precautions:  Precautions Precautions: Fall Restrictions Weight Bearing Restrictions: No Pain:    See Function Navigator for Current Functional Status.   Therapy/Group: Individual Therapy  Lewis, Jonhatan Hearty C 01/15/2017, 7:09 AM

## 2017-01-15 NOTE — Progress Notes (Signed)
Speech Language Pathology Daily Session Note  Patient Details  Name: Thomas [name] MRN: [id number] Date of Birth: [birth year]  Today's Date: 01/15/2017 SLP Individual Time: 9432-7614 SLP Individual Time Calculation (min): 60 min  Short Term Goals: Week 1: SLP Short Term Goal 1 (Week 1): Pt will tolerate regular textures trials with supervision use of compensatory swallow strategies.  SLP Short Term Goal 2 (Week 1): Pt will utilize external memory aids to recall new, daily information with Mod A cues.  SLP Short Term Goal 3 (Week 1): Pt will answer orientation questions with Mod A cues.  SLP Short Term Goal 4 (Week 1): Pt will sustain attention to functional tasks for ~ 15 minutes with Min A cues for redirection. SLP Short Term Goal 5 (Week 1): Pt will complete basic functional task with Mod A cues.  SLP Short Term Goal 6 (Week 1): Pt will state 1 physical and 1 cognitive deficit related to new CVA with Mod A cues.   Skilled Therapeutic Interventions: Skilled treatment session focused on cognition goals. SLP facilitated session by providing Max A for orientation information and for sustained attention to functional tasks for ~ 15 minutes. Pt required Max A to complete peg replication task. [name] present and education provided that pt requires someone to be present with him 24 hours per day and be within arms reach of pt d/t cognitive deficits. CSW to follow up with family as they are unable to provide that level of support. Pt left in daughter's care.      Function:  Eating Eating   Modified Consistency Diet: Yes Eating Assist Level: Set up assist for   Eating Set Up Assist For: Opening containers       Cognition Comprehension Comprehension assist level: Understands basic 75 - 89% of the time/ requires cueing 10 - 24% of the time  Expression   Expression assist level: Expresses basic 75 - 89% of the time/requires cueing 10 - 24% of the time. Needs helper to occlude trach/needs  to repeat words.  Social Interaction Social Interaction assist level: Interacts appropriately 75 - 89% of the time - Needs redirection for appropriate language or to initiate interaction.  Problem Solving Problem solving assist level: Solves basic 50 - 74% of the time/requires cueing 25 - 49% of the time  Memory Memory assist level: Recognizes or recalls 25 - 49% of the time/requires cueing 50 - 75% of the time    Pain    Therapy/Group: Individual Therapy  Ashleigh Arya 01/15/2017, 3:20 PM

## 2017-01-15 NOTE — Telephone Encounter (Signed)
Faxed records to [employer] [phone]

## 2017-01-15 NOTE — Progress Notes (Signed)
Physical Therapy Session Note  Patient Details  Name: Merit Maybee MRN: [id number] Date of Birth: [birth year]  Today's Date: 01/15/2017 PT Individual Time: 1300-1415 PT Individual Time Calculation (min): 75 min   Short Term Goals: Week 1:  PT Short Term Goal 1 (Week 1): pt will transfer with min assist consistently PT Short Term Goal 2 (Week 1): Pt will ambulate 139f with min assist  PT Short Term Goal 3 (Week 1): Pt will propel WC 1568fwith supervision assist PT Short Term Goal 4 (Week 1): Pt will maintain dynamic balance with min assist to prevent Lateral LOB to the R.   Skilled Therapeutic Interventions/Progress Updates: Pt presented in recliner with dgt sharon present agreeable to therapy. Performed stand pivot to w/c min/modA, noted decreased safety awareness and decreased eccentric control upon sitting. Pt propelled self approx 15059fith minA and required x 2 rest breaks due to fatigue. Performed squat pivot transfer to mat minA with cues for safety. After transfer pt stating feeling "shakey" and request to lay down. BP 159/76 HR 69.  Performed sit to supine with min guard and rested x 3 min. Returned supine to sit min guard to EOB. Performed static and dynamic balance activities including reaching and ball toss, initially with HW to provide intermittent support progressing to ball toss with no UE support and min guard. Pt with no LOB with dynamic activity, required several seated rest breaks due to fatigue. Pt with improved safety with transfer and improved eccentric control upon sitting with transfers. Performed ambulation with use of wall rail. Cues for increased step length and increasing BOS. Required cues for posture and positioning of LUE during gait. PTA transported pt back to room due to fatigue and pt performed squat pivot transfer to bed with minA but improved technique. Pt in bed at end of session with bed alarm on and current needs met.      Therapy Documentation Precautions:   Precautions Precautions: Fall Restrictions Weight Bearing Restrictions: No General:   Vital Signs: Therapy Vitals Temp: 98.1 F (36.7 C) Temp Source: Oral Pulse Rate: 75 Resp: 18 BP: 128/86 Patient Position (if appropriate): Lying Oxygen Therapy SpO2: 99 % O2 Device: Not Delivered     See Function Navigator for Current Functional Status.   Therapy/Group: Individual Therapy  Delorice Bannister  Audria Takeshita, PTA  01/15/2017, 3:57 PM

## 2017-01-16 ENCOUNTER — Inpatient Hospital Stay (HOSPITAL_COMMUNITY): Payer: Medicare Other | Admitting: Speech Pathology

## 2017-01-16 ENCOUNTER — Inpatient Hospital Stay (HOSPITAL_COMMUNITY): Payer: Medicare Other | Admitting: Physical Therapy

## 2017-01-16 ENCOUNTER — Inpatient Hospital Stay (HOSPITAL_COMMUNITY): Payer: Medicare Other | Admitting: Occupational Therapy

## 2017-01-16 DIAGNOSIS — N3000 Acute cystitis without hematuria: Secondary | ICD-10-CM

## 2017-01-16 LAB — CBC
HCT: 37.7 % — ABNORMAL LOW (ref 39.0–52.0)
Hemoglobin: 12 g/dL — ABNORMAL LOW (ref 13.0–17.0)
MCH: 27.7 pg (ref 26.0–34.0)
MCHC: 31.8 g/dL (ref 30.0–36.0)
MCV: 87.1 fL (ref 78.0–100.0)
PLATELETS: 285 10*3/uL (ref 150–400)
RBC: 4.33 MIL/uL (ref 4.22–5.81)
RDW: 16.1 % — AB (ref 11.5–15.5)
WBC: 4.9 10*3/uL (ref 4.0–10.5)

## 2017-01-16 LAB — BASIC METABOLIC PANEL
Anion gap: 10 (ref 5–15)
BUN: 16 mg/dL (ref 6–20)
CALCIUM: 9 mg/dL (ref 8.9–10.3)
CHLORIDE: 99 mmol/L — AB (ref 101–111)
CO2: 27 mmol/L (ref 22–32)
CREATININE: 1.3 mg/dL — AB (ref 0.61–1.24)
GFR calc non Af Amer: 47 mL/min — ABNORMAL LOW (ref >60)
GFR, EST AFRICAN AMERICAN: 54 mL/min — AB (ref >60)
Glucose, Bld: 127 mg/dL — ABNORMAL HIGH (ref 65–99)
Potassium: 3.6 mmol/L (ref 3.5–5.1)
SODIUM: 136 mmol/L (ref 135–145)

## 2017-01-16 NOTE — Progress Notes (Signed)
Physical Therapy Session Note  Patient Details  Name: Thomas [name] MRN: [id number] Date of Birth: 01-28-1926  Today's Date: 01/16/2017 PT Individual Time: 1100-1130 and 1330-1415  PT Individual Time Calculation (min): 30 min and 45 min  Short Term Goals: Week 1:  PT Short Term Goal 1 (Week 1): pt will transfer with min assist consistently PT Short Term Goal 2 (Week 1): Pt will ambulate 127ft with min assist  PT Short Term Goal 3 (Week 1): Pt will propel WC 160ft with supervision assist PT Short Term Goal 4 (Week 1): Pt will maintain dynamic balance with min assist to prevent Lateral LOB to the R.   Skilled Therapeutic Interventions/Progress Updates: Pt presented in recliner agreeable to therapy. Performed sit to stand with minA and HHA to w/c. Pt transported to rehab gym hallway. Ambulated 6ft x 1, 33ft x 2 with RW and minA. Pt required cues for staying inside RW, increasing BOS, gait speed, and midline orientation. Trialed use of mirror for visual feedback however pt unable to perform simultaneous tasks gait and looking in mirror. Pt with increased posterior lean when performing x 1 turn to L which required PTA assist for recovery. Pt propelled back to room 119ft with no rest breaks with cues for location back to room. Pt returned to bed performed stand pivot transfer w/c to bed with use of bed rail. Sit to supine performed with supervision and pt able to reposition self in bed mod I. Session ended in bed with bed alarm on and call bell within reach.   Tx2: Pt presented in recliner hand off from SLP, agreeable to therapy. Performed stand pivot transfer to w/c with cues for hand placement and PTA transported to rehab gym. Performed sit to stand from w/c, mat, and standard chair with cues for hand placement Pt demonstrated decreased carryover with transfers requiring mod to max cues for hand placement and safety. Performed static balance in front of mirror no AD, pt would intermittently shuffle feet  forward with difficulty self correcting requiring PTA assist for recovery x 2. After therapeutic break pt ambulated back to room approx 165ft requiring minA and PTA stopping pt x 2 for re-orientation to midline and staying within RW. Pt returned to room and remained seated at EOB with hand off to NT.      Therapy Documentation Precautions:  Precautions Precautions: Fall Restrictions Weight Bearing Restrictions: No General:   Vital Signs: Therapy Vitals Pulse Rate: 78 BP: (!) 150/78 Pain: Pain Assessment Pain Assessment: No/denies pain Pain Score: 0-No pain Faces Pain Scale: No hurt   See Function Navigator for Current Functional Status.   Therapy/Group: Individual Therapy  Ayisha Pol  Zaylan Kissoon, PTA  01/16/2017, 12:36 PM

## 2017-01-16 NOTE — Progress Notes (Signed)
[facility] PHYSICAL MEDICINE & REHABILITATION     PROGRESS NOTE    Subjective/Complaints: .  Discussed UTI with pt and daughter, pt denies burning with urination , is incont   ROS: pt denies nausea, vomiting, diarrhea, cough, shortness of breath or chest pain    Objective: Vital Signs: Blood pressure (!) 152/83, pulse 78, temperature 98.1 F (36.7 C), temperature source Oral, resp. rate 18, height 5\' 11"  (1.803 m), weight 74.8 kg (164 lb 12.8 oz), SpO2 98 %. No results found.  Recent Labs  01/15/17 0907 01/16/17 0459  WBC 6.1 4.9  HGB 13.4 12.0*  HCT 42.7 37.7*  PLT 334 285    Recent Labs  01/16/17 0459  NA 136  K 3.6  CL 99*  GLUCOSE 127*  BUN 16  CREATININE 1.30*  CALCIUM 9.0   CBG (last 3)  No results for input(s): GLUCAP in the last 72 hours.  Wt Readings from Last 3 Encounters:  01/12/17 74.8 kg (164 lb 12.8 oz)  01/05/17 72.6 kg (160 lb 0.9 oz)  12/22/16 75.3 kg (166 lb)    Physical Exam:  Constitutional: He is oriented to person, place, and time. He appears well-developedand well-nourished.  HENT:  Head: Normocephalicand atraumatic.  Mouth/Throat: Oropharynx is clear and moist.  Eyes: Conjunctivaeand EOMare normal. Pupils are equal, round, and reactive to light.  Neck: Normal range of motion. Neck supple.  Cardiovascular:  RRR  Respiratory:  Lungs clear, normal effort Abd: soft/ nt Musculoskeletal: He exhibits no edemaor tenderness.  Neurological: He is alertand oriented to person, place, and time.  Dysarthric but intelligible  Able to follow simple commands without difficulty. Dysmetria with right finger to nose more than left. Did a nice job of feeding self with right hand Sensory deficits RUE and RLE present.  Motor: 4+/5 throughout bilateral UE and LE's  Skin: Skin is warmand dry. No rashnoted. No erythema.  Psychiatric: He has a normal mood and affect. His behavior is normal. Thought contentnormal.   Assessment/Plan: 1.  Right sided ataxia, balance and cognitive/swallowing deficits secondary to right cerebellar and pontine infarcts which require 3+ hours per day of interdisciplinary therapy in a comprehensive inpatient rehab setting. Physiatrist is providing close team supervision and 24 hour management of active medical problems listed below. Physiatrist and rehab team continue to assess barriers to discharge/monitor patient progress toward functional and medical goals.  Function:  Bathing Bathing position Bathing activity did not occur: Refused Position: Production manager parts bathed by patient: Right arm, Left arm, Chest, Abdomen, Right upper leg, Left upper leg, Right lower leg, Left lower leg, Front perineal area, Buttocks Body parts bathed by helper: Back  Bathing assist Assist Level: Touching or steadying assistance(Pt > 75%)      Upper Body Dressing/Undressing Upper body dressing   What is the patient wearing?: Pull over shirt/dress     Pull over shirt/dress - Perfomed by patient: Thread/unthread right sleeve, Thread/unthread left sleeve, Put head through opening, Pull shirt over trunk          Upper body assist Assist Level: Supervision or verbal cues   Set up : To obtain clothing/put away  Lower Body Dressing/Undressing Lower body dressing   What is the patient wearing?: Pants, Socks, Shoes, Underwear Underwear - Performed by patient: Thread/unthread right underwear leg, Thread/unthread left underwear leg, Pull underwear up/down   Pants- Performed by patient: Thread/unthread right pants leg, Thread/unthread left pants leg, Pull pants up/down   Non-skid slipper socks- Performed by patient:  Don/doff right sock, Don/doff left sock   Socks - Performed by patient: Don/doff right sock, Don/doff left sock   Shoes - Performed by patient: Don/doff right shoe, Don/doff left shoe, Fasten right, Fasten left            Lower body assist Assist for lower body dressing: Touching or  steadying assistance (Pt > 75%)      Toileting Toileting Toileting activity did not occur: No continent bowel/bladder event Toileting steps completed by patient: Adjust clothing prior to toileting, Performs perineal hygiene, Adjust clothing after toileting Toileting steps completed by helper: Adjust clothing prior to toileting, Performs perineal hygiene, Adjust clothing after toileting Toileting Assistive Devices: Grab bar or rail  Toileting assist Assist level: Touching or steadying assistance (Pt.75%)   Transfers Chair/bed transfer Chair/bed transfer activity did not occur: Safety/medical concerns Chair/bed transfer method: Squat pivot Chair/bed transfer assist level: Supervision or verbal cues Chair/bed transfer assistive device: Armrests     Locomotion Ambulation     Max distance: 30 Assist level: Moderate assist (Pt 50 - 74%)   Wheelchair   Type: Manual Max wheelchair distance: 168ft  Assist Level: Supervision or verbal cues  Cognition Comprehension Comprehension assist level: Understands basic 75 - 89% of the time/ requires cueing 10 - 24% of the time  Expression Expression assist level: Expresses basic 75 - 89% of the time/requires cueing 10 - 24% of the time. Needs helper to occlude trach/needs to repeat words.  Social Interaction Social Interaction assist level: Interacts appropriately 75 - 89% of the time - Needs redirection for appropriate language or to initiate interaction.  Problem Solving Problem solving assist level: Solves basic 75 - 89% of the time/requires cueing 10 - 24% of the time  Memory Memory assist level: Recognizes or recalls 75 - 89% of the time/requires cueing 10 - 24% of the time   Medical Problem List and Plan: 1. Balance, swallowing, communication and functional deficitssecondary to right cerebellar and pontine infarcts CIRPT, OT team conf in am 2. DVT Prophylaxis/Anticoagulation: Pharmaceutical: Heparin 3. Pain Management: N/A 4.  Mood: Has a positive outlook and supportive family. LCSW to follow for evaluation and support.  5. Neuropsych: This patient iscapable of making decisions on hisown behalf. 6. Skin/Wound Care: routine pressure relief measures.  7. Fluids/Electrolytes/Nutrition: Monitor I/O.    -encourage PO 8. HTN: improvement with increased norvasc. However some increase over last 12 hours  -continue norvasc at 10mg  daily an d monitor for further pattern Vitals:   01/15/17 1514 01/15/17 2042  BP: 128/86 (!) 152/83  Pulse: 75 78  Resp: 18   Temp: 98.1 F (36.7 C)     -prn clonidine for severe spikes 9. Malignant mesothelioma with recurrent pleural effusion: Lifespan of less than 6 months.  10. A fib: Off Eliquis--to be resumed2 wks post CVA ~ June 8 per Neuro 11. Hypokalemia: resolved with supplement.  12. T2DM with Hgb A1C-6.9: Fasting/random BS have ranged from 110 -130.     -dietary education  13. Dyslipidemia: On Crestor.  14.  UTI on bactrim  LOS (Days) 7 A FACE TO FACE EVALUATION WAS PERFORMED  Charlett Blake, MD 01/16/2017 8:27 AM

## 2017-01-16 NOTE — Progress Notes (Signed)
Physical Therapy Weekly Progress Note  Patient Details  Name: Thomas [name] MRN: [id number] Date of Birth: 07-26-1926  Beginning of progress report period: Jan 10, 2017 End of progress report period: January 16, 2017  Today's Date: 01/17/2017     Patient has met 3 of 4 short term goals. Pt has demonstrated some progress towards goals. He continues to demonstrate limitations due to cognitive deficits limiting carryover with safety during gait and transfers. He continues to demonstrate decreased balance recovery strategies during dynamic balance activities requiring min assist with moderate to at times max verbal cues for recovery and safety.    Patient continues to demonstrate the following deficits muscle weakness, impaired timing and sequencing, decreased coordination and decreased motor planning and decreased attention, decreased awareness, decreased problem solving, decreased safety awareness and decreased memory and therefore will continue to benefit from skilled PT intervention to increase functional independence with mobility.  Patient progressing toward long term goals..  Continue plan of care.  PT Short Term Goals Week 1:  PT Short Term Goal 1 (Week 1): pt will transfer with min assist consistently PT Short Term Goal 1 - Progress (Week 1): Met PT Short Term Goal 2 (Week 1): Pt will ambulate 192f with min assist  PT Short Term Goal 2 - Progress (Week 1): Met PT Short Term Goal 3 (Week 1): Pt will propel WC 1560fwith supervision assist PT Short Term Goal 3 - Progress (Week 1): Met PT Short Term Goal 4 (Week 1): Pt will maintain dynamic balance with min assist to prevent Lateral LOB to the R.  PT Short Term Goal 4 - Progress (Week 1): Progressing toward goal Week 2:    STG =LTG due to ELOS  Skilled Therapeutic Interventions/Progress Updates:      Therapy Documentation Precautions:  Precautions Precautions: Fall Restrictions Weight Bearing Restrictions: No Vital Signs: Therapy  Vitals Temp: 98.1 F (36.7 C) Temp Source: Oral Pulse Rate: 75 Resp: 20 BP: (!) 152/84 Patient Position (if appropriate): Sitting Oxygen Therapy SpO2: 98 % O2 Device: Not Delivered Pain: Pain Assessment Pain Assessment: No/denies pain   See Function Navigator for Current Functional Status.  Therapy/Group: Individual Therapy  Rosita DeChalus  Rosita DeChalus, PTA  01/16/2017, 3:57 PM

## 2017-01-16 NOTE — Progress Notes (Signed)
Occupational Therapy Session Note  Patient Details  Name: Thomas [name] MRN: [id number] Date of Birth: June 20, 1926  Today's Date: 01/16/2017 OT Individual Time: 1435-1505 (Make up time) OT Individual Time Calculation (min): 30 min    Short Term Goals: Week 1:  OT Short Term Goal 1 (Week 1): Pt will stand with supervision to complete 2 grooming tasks at sink OT Short Term Goal 2 (Week 1): Pt will ambulate into bathroom with min A in prep for ADL task OT Short Term Goal 3 (Week 1): Pt will complete 3/3 toileting tasks with CGA  Skilled Therapeutic Interventions/Progress Updates:    Treatment session with focus on sit <> stand and functional transfers.  Pt received in bed asleep, but easily aroused to participate in treatment session.  Donned shoes with setup seated at EOB.  Completed transfer with min assist to min guard with RW due to decreased awareness and recall of cues throughout.  Educated on tub/shower transfer with tub bench.  Pt able to complete with min assist with tactile cues due to Rt lean during decent onto bench.  Blocked practice with stand pivot transfers with cues for hand placement.  Returned to bed at end of session.  Therapy Documentation Precautions:  Precautions Precautions: Fall Restrictions Weight Bearing Restrictions: No General:   Vital Signs: Therapy Vitals Temp: 98.1 F (36.7 C) Temp Source: Oral Pulse Rate: 75 Resp: 20 BP: (!) 152/84 Patient Position (if appropriate): Sitting Oxygen Therapy SpO2: 98 % O2 Device: Not Delivered Pain: Pain Assessment Pain Assessment: No/denies pain  See Function Navigator for Current Functional Status.   Therapy/Group: Individual Therapy  [name] 01/16/2017, 3:43 PM

## 2017-01-16 NOTE — Evaluation (Signed)
Recreational Therapy Assessment and Plan  Patient Details  Name: Thomas [name] MRN: [id number] Date of Birth: Sep 27, 1925 Today's Date: 01/16/2017  Rehab Potential: Good ELOS: 10 days   Assessment      Patient Active Problem List   Diagnosis Date Noted  . Stroke due to embolism of right posterior cerebral artery ([place]) 01/09/2017  . Ataxia   . Benign essential HTN   . Esophageal stricture   . PAF (paroxysmal atrial fibrillation) ([place])   . Recurrent left pleural effusion   . Dysphagia, post-stroke   . Type 2 diabetes mellitus with circulatory disorder ([place])   . Acute blood loss anemia   . Mesothelioma of left lung ([place])   . Palliative care by specialist   . Stroke due to embolism of posterior inferior cerebral artery ([place]) d/t AF 01/05/2017  . Goals of care, counseling/discussion 12/23/2016  . Encounter for antineoplastic chemotherapy 12/23/2016  . Vertigo following cerebrovascular accident 12/16/2016  . Hyperkalemia 12/16/2016  . Type 2 diabetes mellitus with hyperlipidemia ([place]) 12/16/2016  . Insomnia 12/07/2016  . Atelectasis 10/30/2016  . Alcohol abuse 08/04/2016  . Permanent atrial fibrillation ([name]) 07/21/2016  . Malignant mesothelioma of pleura ([place]) 02/16/2016  . Perforation of right tympanic membrane 11/24/2013  . Carotid stenosis 02/21/2013  . TIA (transient ischemic attack) 02/17/2013  . Hypokalemia 11/26/2012  . GERD (gastroesophageal reflux disease) 05/17/2011  . Dysphagia 05/17/2011  . Stricture and stenosis of esophagus 08/29/2010  . Allergic rhinitis 01/16/2008  . 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 PROSTATE CANCER, HX OF 03/26/2007  . Hyperlipidemia 03/20/2007  . Essential hypertension 03/20/2007    Past Medical History:      Past Medical History:  Diagnosis Date  . Adenomatous colon polyp 2009  . Allergy   . Carotid artery occlusion   . Diverticulosis of colon (without mention of hemorrhage)   . ED (erectile dysfunction)    . Esophageal stricture   . Esophagitis   . GERD (gastroesophageal reflux disease)   . Goals of care, counseling/discussion 12/23/2016  . Hiatal hernia   . Hypertension   . Prostate cancer ([place])   . Unspecified gastritis and gastroduodenitis without mention of hemorrhage    Past Surgical History:       Past Surgical History:  Procedure Laterality Date  . ENDARTERECTOMY Left 03/05/2013   Procedure: ENDARTERECTOMY CAROTID;  Surgeon: Conrad [place], MD;  Location: [facility];  Service: Vascular;  Laterality: Left;  . ESOPHAGOGASTRODUODENOSCOPY (EGD) WITH ESOPHAGEAL DILATION    . PROSTATE SURGERY     Laser surgery    Assessment & Plan Clinical Impression: Thomas Johnsonis a 83 y.o.malewith history of HTN, esophageal stricture, PAF, recurrent left pleural effusion, malignant mesothelioma (less than 6 moths life expectancy), 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 History taken from chart review and family. CTA head/neck showed acute to subacute right cerebellar infarct with interval occlusion of right V3 and V4 segments with intermittent tenuous blood flow in basilar and bilateral PCA and severe atherosclerosis of bilateral cervical carotids with left mid ICA intimal flap causing 50% stenosis. MRI brain reviewed, showing acute right cerebellar and pontine infarcts with petechial hemorrhage and stable old bilateral occipital and left frontal/parietal infarcts. Eliquis changed to ASA due to size of stroke and concerns of hemorrhagic conversion with recommendations to resume in two weeks. MBS done revealing delayed swallow with sensed aspiration of thins and diet has been advanced to dysphagia 3, thin liquids. ~Patient with resultant balance  deficits with ataxia and CIR recommended for follow up therapy Patient transferred to CIR on 01/09/2017 .    Pt presents with decreased activity tolerance, decreased functional  mobility, decreased balance, decreased problem solving, delayed processing, decreased safety Limiting pt's independence with leisure/community pursuits.   Leisure History/Participation Premorbid leisure interest/current participation: Sports - Lexicographer - Building control surveyor - Doctor, hospital - Travel (Comment) Leisure Participation Style: Alone;With Family/Friends Awareness of Community Resources: Good-identify 3 post discharge leisure resources Psychosocial / Spiritual Spiritual Interests: Church;Womens'Men's Groups Social interaction - Mood/Behavior: Cooperative Engineer, drilling for Education?: Yes Recreational Therapy Orientation Orientation -Reviewed with patient: Available activity resources Strengths/Weaknesses Patient Strengths/Abilities: Willingness to participate;Active premorbidly Patient weaknesses: Physical limitations TR Patient demonstrates impairments in the following area(s): Endurance;Safety  Plan Rec Therapy Plan Is patient appropriate for Therapeutic Recreation?: Yes Rehab Potential: Good Treatment times per week: Min 1 TR session/group >20 minutes during LOS Estimated Length of Stay: 10 days TR Treatment/Interventions: Adaptive equipment instruction;1:1 session;Balance/vestibular training;Functional mobility training;Community reintegration;Group participation (Comment);Patient/family education;Therapeutic activities;Recreation/leisure participation;Therapeutic exercise;UE/LE Coordination activities  Recommendations for other services: None   Discharge Criteria: Patient will be discharged from TR if patient refuses treatment 3 consecutive times without medical reason.  If treatment goals not met, if there is a change in medical status, if patient makes no progress towards goals or if patient is discharged from hospital.  The above assessment, treatment plan, treatment alternatives and goals were discussed and mutually agreed upon:  by patient  [place] 01/16/2017, 11:56 AM

## 2017-01-16 NOTE — Progress Notes (Signed)
Occupational Therapy Session Note  Patient Details  Name: Thomas [name] MRN: [id number] Date of Birth: January 13, 1926  Today's Date: 01/16/2017 OT Individual Time: 9357-0177 OT Individual Time Calculation (min): 60 min    Short Term Goals: Week 1:  OT Short Term Goal 1 (Week 1): Pt will stand with supervision to complete 2 grooming tasks at sink OT Short Term Goal 2 (Week 1): Pt will ambulate into bathroom with min A in prep for ADL task OT Short Term Goal 3 (Week 1): Pt will complete 3/3 toileting tasks with CGA  Skilled Therapeutic Interventions/Progress Updates:    Pt seen for OT ADL bathing/dressing session. Pt in recliner upon arrival, agreeable to tx session. He ambulated throughout session with RW and min A, multimodal cuing required for RW management in functional context. He bathed seated on tub bench with supervision for seated position and cuing for sequencing as pt attemtping to pour soap prior to opening cap and washing hair without first wetting it. He required steadying assist for standing portions of buttock and pericare hygiene.  He dressed seated on toilet with increased time for clothing orientation and VCs for problem solving positioning in order to increase independence (i.e. Crossing ankle over knee vs bending to floor). Grooming tasks completed standing at sink, VCs for sequencing as pt not placing toothpaste on brush before brushing and cuing to turn water off at end of task. He ate breakfast seated EOB with set-up assist from pt's daughter. While pt eating, discussed d/c planning with pt's daughter and OT/PT goals. She is unsure about d/c disposition at this time given what family members can provide at d/c, educated regarding SNF and pt's current level of function. Directed all further questions to CSW. Pt then ambulated throughout unit with RW and min-mod A with VCs for RW management and safety awareness. Seated rest break on low soft surface couch required mod A to stand  from. He returned to room at end of session and left seated in recliner with all needs in reach, QRB donned and chair alarm activated. Throughout session pt required VCs for hand placement on RW during sit <> stand with no carry over of education btwn trials.   Therapy Documentation Precautions:  Precautions Precautions: Fall Restrictions Weight Bearing Restrictions: No Pain:   No/ denies pain  See Function Navigator for Current Functional Status.   Therapy/Group: Individual Therapy  Lewis, Maizie Garno C 01/16/2017, 7:05 AM

## 2017-01-16 NOTE — Progress Notes (Signed)
Speech Language Pathology Daily Session Notes  Patient Details  Name: Thomas [name] MRN: [id number] Date of Birth: 01/11/1926  Today's Date: 01/16/2017  Session 1 SLP Individual Time: 1000-1030 SLP Individual Time Calculation (min): 30 min Session 2 SLP Individual Time: 6759-1638 SLP Individual Time Calculation (min): 25 min  Short Term Goals: Week 1: SLP Short Term Goal 1 (Week 1): Pt will tolerate regular textures trials with supervision use of compensatory swallow strategies.  SLP Short Term Goal 2 (Week 1): Pt will utilize external memory aids to recall new, daily information with Mod A cues.  SLP Short Term Goal 3 (Week 1): Pt will answer orientation questions with Mod A cues.  SLP Short Term Goal 4 (Week 1): Pt will sustain attention to functional tasks for ~ 15 minutes with Min A cues for redirection. SLP Short Term Goal 5 (Week 1): Pt will complete basic functional task with Mod A cues.  SLP Short Term Goal 6 (Week 1): Pt will state 1 physical and 1 cognitive deficit related to new CVA with Mod A cues.   Skilled Therapeutic Interventions: Session 1 Skilled treatment session focused on addressing cognition goals, as patient declined PO trials at this time due to being full. SLP facilitated session by providing set-up of newspaper prior to discussion regarding orientation information, with use of external aid patient was Mod I for recall of date. He was independently able to recall place, but required Total assist to recall CVA.  Patient stated that his walking was off now, but required Max assist multimodal cues to identify cognitive deficits.  Patient played off deficits as being due to his age.  Continue with current plan of care.   Session 2:  Skilled treatment session focused on addressing dysphagia goals. SLP facilitated session with transfer assist with RN also present to assist, patient assisted from bed to chair for optimal positioning for swallow function.  Following transfer  assist patient demonstrated shaking of right upper and lower extremities with slurred speech, following 1-2 minutes it subsided RN and PA made aware.  Patient appeared to return to baseline with intelligible speech and reported that he was hungry.  As a result SLP facilitated session with set-up assist of regular textures and thin liquids with timely mastication and oral clearance as well as no overt s/s of aspiration. Patient then consumed lunch tray of Dys.3 textures and thin liquids with intermittent verbal cues for portion control and pacing wihich effectively prevented overt s/s of aspiration.  Recommend skilled observation of upgraded trial tray at next available visits.   Continue with current plan of care for now.       Function:  Eating Eating   Modified Consistency Diet: No (trials with SLP) Eating Assist Level: Set up assist for;Supervision or verbal cues   Eating Set Up Assist For: Opening containers       Cognition Comprehension Comprehension assist level: Understands basic 75 - 89% of the time/ requires cueing 10 - 24% of the time  Expression   Expression assist level: Expresses basic 75 - 89% of the time/requires cueing 10 - 24% of the time. Needs helper to occlude trach/needs to repeat words.  Social Interaction Social Interaction assist level: Interacts appropriately 90% of the time - Needs monitoring or encouragement for participation or interaction.  Problem Solving Problem solving assist level: Solves basic 75 - 89% of the time/requires cueing 10 - 24% of the time  Memory Memory assist level: Recognizes or recalls 50 - 74% of the time/requires  cueing 25 - 49% of the time    Pain Pain Assessment Pain Assessment: No/denies pain Pain Score: 0-No pain Faces Pain Scale: No hurt x2  Therapy/Group: Individual Therapy x2  Gunnar Fusi, M.A., [place] [phone]  [place] 01/16/2017, 1:24 PM

## 2017-01-17 ENCOUNTER — Inpatient Hospital Stay (HOSPITAL_COMMUNITY): Payer: Medicare Other | Admitting: Speech Pathology

## 2017-01-17 ENCOUNTER — Inpatient Hospital Stay (HOSPITAL_COMMUNITY): Payer: Medicare Other | Admitting: Occupational Therapy

## 2017-01-17 ENCOUNTER — Inpatient Hospital Stay (HOSPITAL_COMMUNITY): Payer: Medicare Other | Admitting: Physical Therapy

## 2017-01-17 DIAGNOSIS — I482 Chronic atrial fibrillation: Secondary | ICD-10-CM

## 2017-01-17 LAB — URINE CULTURE

## 2017-01-17 MED ORDER — ROSUVASTATIN CALCIUM 10 MG PO TABS: 10.0000 mg | ORAL_TABLET | Freq: Every day | ORAL | Status: DC

## 2017-01-17 MED ORDER — CLONIDINE HCL 0.1 MG PO TABS
0.1000 mg | ORAL_TABLET | Freq: Three times a day (TID) | ORAL | Status: DC
Start: 2017-01-17 — End: 2017-01-18
  Administered 2017-01-17 – 2017-01-18 (×3): 0.1 mg via ORAL
  Filled 2017-01-17 (×3): qty 1

## 2017-01-17 NOTE — Patient Care Conference (Signed)
Inpatient RehabilitationTeam Conference and Plan of Care Update Date: 01/17/2017   Time: 10:30 AM    Patient Name: Thomas [name]      Medical Record Number: [id number]  Date of Birth: [birth year] Sex: Male         Room/Bed: 4W21C/4W21C-01 Payor Info: Payor: [place] / Plan: BCBS MEDICARE / Product Type: *No Product type* /    Admitting Diagnosis: R CVA  Admit Date/Time:  01/09/2017  6:24 PM Admission Comments: No comment available   Primary Diagnosis:  <principal problem not specified> Principal Problem: <principal problem not specified>  Patient Active Problem List   Diagnosis Date Noted  . Stroke due to embolism of right posterior cerebral artery ([place]) 01/09/2017  . Ataxia   . Benign essential HTN   . Esophageal stricture   . PAF (paroxysmal atrial fibrillation) ([place])   . Recurrent left pleural effusion   . Dysphagia, post-stroke   . Type 2 diabetes mellitus with circulatory disorder ([place])   . Acute blood loss anemia   . Mesothelioma of left lung ([place])   . Palliative care by specialist   . Stroke due to embolism of posterior inferior cerebral artery ([place]) d/t AF 01/05/2017  . Goals of care, counseling/discussion 12/23/2016  . Encounter for antineoplastic chemotherapy 12/23/2016  . Vertigo following cerebrovascular accident 12/16/2016  . Hyperkalemia 12/16/2016  . Type 2 diabetes mellitus with hyperlipidemia ([place]) 12/16/2016  . Insomnia 12/07/2016  . Atelectasis 10/30/2016  . Alcohol abuse 08/04/2016  . Permanent atrial fibrillation ([place]) 07/21/2016  . Malignant mesothelioma of pleura ([place]) 02/16/2016  . Perforation of right tympanic membrane 11/24/2013  . Carotid stenosis 02/21/2013  . TIA (transient ischemic attack) 02/17/2013  . Hypokalemia 11/26/2012  . GERD (gastroesophageal reflux disease) 05/17/2011  . Dysphagia 05/17/2011  . Stricture and stenosis of esophagus 08/29/2010  . Allergic rhinitis 01/16/2008  . ERECTILE DYSFUNCTION, ORGANIC  03/26/2007  . PROSTATE CANCER, HX OF 03/26/2007  . Hyperlipidemia 03/20/2007  . Essential hypertension 03/20/2007    Expected Discharge Date: Expected Discharge Date:  (SNF)  Team Members Present: Physician leading conference: Dr. Alysia Penna Social Worker Present: Alfonse Alpers, LCSW Nurse Present: Other (comment) Genene Churn, RN) PT Present: Barrie Folk, PT OT Present: Napoleon Form, OT SLP Present: Gunnar Fusi, SLP PPS Coordinator present : Daiva Nakayama, RN, CRRN     Current Status/Progress Goal Weekly Team Focus  Medical   cognitive deficits ? predating CVA  management of medical complications  Treat UTI    Bowel/Bladder   Patient incontinent at times of B/B, LBM 01/16/17 due to confusion at HS.    continent of bowel and bladder  monitor bowel and bladder   Swallow/Nutrition/ Hydration   Dys.3 textues and thin liquids with intermittent supervision   Supervision with least restrictive diet  trial tray of regular textuers and thin lquids    ADL's   Min A overall  Supervision overall  Activity tolerance, cognitive re-training, family ed, d/c planning   Mobility   supervision bed mobilty, minA transfers, minA gait with mod/max verbal cues  supervision overall  balance, transfers, gait   Communication   Min for basic   Supervision   increase comp of basic info   Safety/Cognition/ Behavioral Observations  Mod-Max assist with poor recall; needs quick release belt as well as chair alarm  Supervision-Min A  recall and corryover with use of call bell to request help    Pain   no c/o pain  pain less than or equal  to 3  assess pain q shift   Skin   no skin break down  no new areas of skin break down  continue to assess skin q shift    Rehab Goals Patient on target to meet rehab goals: Yes Rehab Goals Revised: Pt's diet was upgraded to regular textures and thin liquids *See Care Plan and progress notes for long and short-term goals.  Barriers to Discharge: cognitive  def, BP    Possible Resolutions to Barriers:  May need placement if 24/7    Discharge Planning/Teaching Needs:  Family is feeling like pt will need to go to SNF prior to returning home.  Family education will be provided closer to d/c, if plan changes back to pt going home.   Team Discussion:  Pt with UTI over the weekend, but pt improving with treatment.  Pt is worried about his wife and is ready to be with her.  Pt with cognitive deficits which makes carryover difficult for him.  ST reports that it is like starting over with pt each day.  Diet upgraded today and ST to see how pt does.  Pt is min to mod A with therapists, especially due to poor midline.   Revisions to Treatment Plan:  none   Continued Need for Acute Rehabilitation Level of Care: The patient requires daily medical management by a physician with specialized training in physical medicine and rehabilitation for the following conditions: Daily direction of a multidisciplinary physical rehabilitation program to ensure safe treatment while eliciting the highest outcome that is of practical value to the patient.: Yes Daily medical management of patient stability for increased activity during participation in an intensive rehabilitation regime.: Yes Daily analysis of laboratory values and/or radiology reports with any subsequent need for medication adjustment of medical intervention for : Neurological problems;Urological problems  Falicia Lizotte, Silvestre Mesi 01/17/2017, 7:31 PM

## 2017-01-17 NOTE — Progress Notes (Signed)
[facility] PHYSICAL MEDICINE & REHABILITATION     PROGRESS NOTE    Subjective/Complaints: .  Reviewed micro, discussed with pt. Pt wants to return home to support wife who is disabled per pt.  Son is living with them   ROS: pt denies nausea, vomiting, diarrhea, cough, shortness of breath or chest pain    Objective: Vital Signs: Blood pressure (!) 163/86, pulse 67, temperature 97.5 F (36.4 C), temperature source Oral, resp. rate 18, height '5\' 11"'  (1.803 m), weight 68.5 kg (151 lb 0.2 oz), SpO2 100 %. No results found.  Recent Labs  01/15/17 0907 01/16/17 0459  WBC 6.1 4.9  HGB 13.4 12.0*  HCT 42.7 37.7*  PLT 334 285    Recent Labs  01/16/17 0459  NA 136  K 3.6  CL 99*  GLUCOSE 127*  BUN 16  CREATININE 1.30*  CALCIUM 9.0   CBG (last 3)  No results for input(s): GLUCAP in the last 72 hours.  Wt Readings from Last 3 Encounters:  01/17/17 68.5 kg (151 lb 0.2 oz)  01/05/17 72.6 kg (160 lb 0.9 oz)  12/22/16 75.3 kg (166 lb)    Physical Exam:  Constitutional: He is oriented to person, place, and time. He appears well-developedand well-nourished.  HENT:  Head: Normocephalicand atraumatic.  Mouth/Throat: Oropharynx is clear and moist.  Eyes: Conjunctivaeand EOMare normal. Pupils are equal, round, and reactive to light.  Neck: Normal range of motion. Neck supple.  Cardiovascular:  RRR  Respiratory:  Lungs clear, normal effort Abd: soft/ nt Musculoskeletal: He exhibits no edemaor tenderness.  Neurological: He is alertand oriented to person, place, and time.  Dysarthric but intelligible  Able to follow simple commands without difficulty. Dysmetria with right finger to nose more than left. Did a nice job of feeding self with right hand Sensory deficits RUE and RLE present.  Motor: 4+/5 throughout bilateral UE and LE's  Skin: Skin is warmand dry. No rashnoted. No erythema.  Psychiatric: He has a normal mood and affect. His behavior is normal. Thought  contentnormal.   Assessment/Plan: 1. Right sided ataxia, balance and cognitive/swallowing deficits secondary to right cerebellar and pontine infarcts which require 3+ hours per day of interdisciplinary therapy in a comprehensive inpatient rehab setting. Physiatrist is providing close team supervision and 24 hour management of active medical problems listed below. Physiatrist and rehab team continue to assess barriers to discharge/monitor patient progress toward functional and medical goals.  Function:  Bathing Bathing position Bathing activity did not occur: Refused Position: Production manager parts bathed by patient: Right arm, Left arm, Chest, Abdomen, Right upper leg, Left upper leg, Right lower leg, Left lower leg, Front perineal area, Buttocks Body parts bathed by helper: Back  Bathing assist Assist Level: Touching or steadying assistance(Pt > 75%)      Upper Body Dressing/Undressing Upper body dressing   What is the patient wearing?: Pull over shirt/dress     Pull over shirt/dress - Perfomed by patient: Thread/unthread right sleeve, Thread/unthread left sleeve, Put head through opening, Pull shirt over trunk          Upper body assist Assist Level: Supervision or verbal cues   Set up : To obtain clothing/put away  Lower Body Dressing/Undressing Lower body dressing   What is the patient wearing?: Pants, Socks, Shoes, Underwear Underwear - Performed by patient: Thread/unthread right underwear leg, Thread/unthread left underwear leg, Pull underwear up/down   Pants- Performed by patient: Thread/unthread right pants leg, Thread/unthread left pants leg, Pull  pants up/down   Non-skid slipper socks- Performed by patient: Don/doff right sock, Don/doff left sock   Socks - Performed by patient: Don/doff right sock, Don/doff left sock   Shoes - Performed by patient: Don/doff right shoe, Don/doff left shoe, Fasten right, Fasten left            Lower body assist  Assist for lower body dressing: Touching or steadying assistance (Pt > 75%)      Toileting Toileting Toileting activity did not occur: No continent bowel/bladder event Toileting steps completed by patient: Performs perineal hygiene, Adjust clothing after toileting, Adjust clothing prior to toileting Toileting steps completed by helper: Adjust clothing prior to toileting, Performs perineal hygiene, Adjust clothing after toileting Toileting Assistive Devices: Grab bar or rail  Toileting assist Assist level: Touching or steadying assistance (Pt.75%)   Transfers Chair/bed transfer Chair/bed transfer activity did not occur: Safety/medical concerns Chair/bed transfer method: Squat pivot Chair/bed transfer assist level: Supervision or verbal cues Chair/bed transfer assistive device: Armrests     Locomotion Ambulation     Max distance: 30 Assist level: Moderate assist (Pt 50 - 74%)   Wheelchair   Type: Manual Max wheelchair distance: 163f  Assist Level: Supervision or verbal cues  Cognition Comprehension Comprehension assist level: Understands basic 75 - 89% of the time/ requires cueing 10 - 24% of the time  Expression Expression assist level: Expresses complex ideas: With extra time/assistive device  Social Interaction Social Interaction assist level: Interacts appropriately 90% of the time - Needs monitoring or encouragement for participation or interaction.  Problem Solving Problem solving assist level: Solves basic 75 - 89% of the time/requires cueing 10 - 24% of the time  Memory Memory assist level: Recognizes or recalls 50 - 74% of the time/requires cueing 25 - 49% of the time   Medical Problem List and Plan: 1. Balance, swallowing, communication and functional deficitssecondary to right cerebellar and pontine infarcts CIRPT, OT Team conference today please see physician documentation under team conference tab, met with team face-to-face to discuss problems,progress,  and goals. Formulized individual treatment plan based on medical history, underlying problem and comorbidities. 2. DVT Prophylaxis/Anticoagulation: Pharmaceutical: Heparin 3. Pain Management: N/A 4. Mood: Has a positive outlook and supportive family. LCSW to follow for evaluation and support.  5. Neuropsych: This patient iscapable of making decisions on hisown behalf. 6. Skin/Wound Care: routine pressure relief measures.  7. Fluids/Electrolytes/Nutrition: Monitor I/O.    -encourage PO 8. HTN: improvement with increased norvasc. However some increase over last 12 hours  -continue norvasc at 163mdaily , was on scheduled clonidine at home will resume Vitals:   01/16/17 2153 01/17/17 0544  BP: (!) 183/96 (!) 163/86  Pulse: (!) 111 67  Resp:  18  Temp:  97.5 F (36.4 C)    -prn clonidine for severe spikes 9. Malignant mesothelioma with recurrent pleural effusion: Lifespan of less than 6 months.  10. A fib: Off Eliquis--to be resumed2 wks post CVA ~ June 8 per Neuro 11. Hypokalemia: resolved with supplement.  12. T2DM with Hgb A1C-6.9: Fasting/random BS have ranged from 110 -130.     -dietary education  13. Dyslipidemia: On Crestor.  14.  UTI on bactrim, sens  LOS (Days) 8 A FACE TO FACE EVALUATION WAS PERFORMED  KICharlett BlakeMD 01/17/2017 9:20 AM

## 2017-01-17 NOTE — Progress Notes (Signed)
Occupational Therapy Session Note  Patient Details  Name: Thomas [name] MRN: [id number] Date of Birth: [birth year]  Today's Date: 01/17/2017 OT Individual Time: 1000-1045 OT Individual Time Calculation (min): 45 min    Short Term Goals: Week 1:  OT Short Term Goal 1 (Week 1): Pt will stand with supervision to complete 2 grooming tasks at sink OT Short Term Goal 2 (Week 1): Pt will ambulate into bathroom with min A in prep for ADL task OT Short Term Goal 3 (Week 1): Pt will complete 3/3 toileting tasks with CGA  Skilled Therapeutic Interventions/Progress Updates:    1:1 When arrive pt insistent on going home because he was worried about his wife. Pt continued to repeat himself and had difficulty transitioning to actiivty. Able to redirect pt to ambulate with RW with mod A to the sink and engage in shaving. Pt able to shave in standing for ~2 min before needing to sit. Therapist completed task due to fatigue and thoroughness.   Transitioned into the hallway outside the gym. Perform side stepping down the hallway with bilateral UE support on rail with theraband wrapped around his thighs to focus on controlled stepping with resistance. Required max cuing to slow down and not take such quick short steps. Functional ambulation with targets on the floor to promote even step length (especially on the right) and max A to steer and control RW in midline. Pt with decr activity tolerance requiring frequent rest breaks. Returned to room with chair alarm and quick release belt.    Therapy Documentation Precautions:  Precautions Precautions: Fall Restrictions Weight Bearing Restrictions: No Pain:  no c/o pain   See Function Navigator for Current Functional Status.   Therapy/Group: Individual Therapy  Willeen Cass [facility] 01/17/2017, 2:49 PM

## 2017-01-17 NOTE — Progress Notes (Signed)
Late entry from 01-16-17. Patient's family requesting RN to cut patient's fingernails and toenails. Explained to family protocol is not to cut patient's fingernails and toenails by staff but family is able to cut patient's fingernails and toenails. Reviewed with family this service would potentially be as an outpatient service. Lezlie Lye, PA notified. Mliss Sax

## 2017-01-17 NOTE — Progress Notes (Signed)
Speech Language Pathology Weekly Progress Note  Patient Details  Name: Thomas [name] MRN: [id number] Date of Birth: 07/01/1926  Beginning of progress report period: Jan 10, 2017 End of progress report period: January 17, 2017  Short Term Goals: Week 1: SLP Short Term Goal 1 (Week 1): Pt will tolerate regular textures trials with supervision use of compensatory swallow strategies.  SLP Short Term Goal 1 - Progress (Week 1): Progressing toward goal SLP Short Term Goal 2 (Week 1): Pt will utilize external memory aids to recall new, daily information with Mod A cues.  SLP Short Term Goal 2 - Progress (Week 1): Met SLP Short Term Goal 3 (Week 1): Pt will answer orientation questions with Mod A cues.  SLP Short Term Goal 3 - Progress (Week 1): Met SLP Short Term Goal 4 (Week 1): Pt will sustain attention to functional tasks for ~ 15 minutes with Min A cues for redirection. SLP Short Term Goal 4 - Progress (Week 1): Met SLP Short Term Goal 5 (Week 1): Pt will complete basic functional task with Mod A cues.  SLP Short Term Goal 5 - Progress (Week 1): Met SLP Short Term Goal 6 (Week 1): Pt will state 1 physical and 1 cognitive deficit related to new CVA with Mod A cues.  SLP Short Term Goal 6 - Progress (Week 1): Met    New Short Term Goals: Week 2: SLP Short Term Goal 1 (Week 2): Pt will tolerate regular textures trials with Supervision use of compensatory swallow strategies.  SLP Short Term Goal 2 (Week 2): Pt will utilize external aids for re-orientation and recall of daily information with Supervision cues.  SLP Short Term Goal 3 (Week 2): Pt will sustain attention to functional tasks for ~ 15 minutes with Supervision cues for redirection. SLP Short Term Goal 4 (Week 2): Patient will demonstrate use of call bell to request assist with Min A question cues. SLP Short Term Goal 5 (Week 2): Pt will complete basic functional task with Min A cues.   Weekly Progress Updates: Patient has made  functional gains and has met 5 out of 6 short term goals this reporting period due to improved cognitive skills. Currently, patient continues to require overall Min-Mod assist for basic cognitive task and requires on going trials of regular textures prior to upgrade.  Patient and family education is ongoing at this time. Patient would benefit from continued skilled SLP intervention to maximize functional independence and to maximize their functional independence prior to discharge with 24 hour supervision.   Intensity: Minumum of 1-2 x/day, 30 to 90 minutes Frequency: 3 to 5 out of 7 days Duration/Length of Stay: 01/26/17 Treatment/Interventions: Cognitive remediation/compensation;Cueing hierarchy;Dysphagia/aspiration precaution training;Functional tasks;Medication managment;Patient/family education;Therapeutic Activities;Internal/external aids  Carmelia Roller., [place]  [place] 01/17/2017, 9:45 PM

## 2017-01-17 NOTE — Progress Notes (Signed)
Physical Therapy Session Note  Patient Details  Name: Thomas [name] MRN: [id number] Date of Birth: 03/21/1926  Today's Date: 01/17/2017 PT Individual Time: 1324-4010 PT Individual Time Calculation (min): 57 min   Short Term Goals: Week 1:  PT Short Term Goal 1 (Week 1): pt will transfer with min assist consistently PT Short Term Goal 1 - Progress (Week 1): Met PT Short Term Goal 2 (Week 1): Pt will ambulate 147f with min assist  PT Short Term Goal 2 - Progress (Week 1): Met PT Short Term Goal 3 (Week 1): Pt will propel WC 1547fwith supervision assist PT Short Term Goal 3 - Progress (Week 1): Met PT Short Term Goal 4 (Week 1): Pt will maintain dynamic balance with min assist to prevent Lateral LOB to the R.  PT Short Term Goal 4 - Progress (Week 1): Progressing toward goal  Skilled Therapeutic Interventions/Progress Updates:   Pt received sitting in recliner and agreeable to PT. [facility]. Max cues for safety in turn AD management.  WC mobility through hall of hospital x 1509fith supervision assist from PT with min cues for UE technique to maintain straight path.  Gait training x [id number] with min progressing to mod assist and RW. Moderate cues for improved step length bilaterally, increased BOS and improved weight shift to the L.  Additional gait training with RW x [id number] min-mod assist from PT with multiple standing rest breaks. Pt noted to have decreased midline awareness when fatigued, but able to correct with standing rest break.   Bed mobility to returned to bed following stand pivot transfer with RW. Poor midline orientation and LE positioning while turning to the L resulting in PT providing mod assist to prevent LOB. Sit>supine with supervision assist from PT.  Throughout treatment, Pt required prolonged therapeutic rest breaks due to fatigue. Pt states, "I'm 89 and get tired much faster that others". No nausea throughout tx session.    Pt left supine in bed with bed alarm on and call bell in reach.       Therapy Documentation Precautions:  Precautions Precautions: Fall Restrictions Weight Bearing Restrictions: No Vital Signs: Therapy Vitals Pulse Rate: 76 BP: 136/77 Pain: 0/10   See Function Navigator for Current Functional Status.   Therapy/Group: Individual Therapy  AustiLorie Phenix2018, 11:56 PM

## 2017-01-17 NOTE — Progress Notes (Signed)
Speech Language Pathology Daily Session Notes  Patient Details  Name: Ithiel Liebler MRN: [id number] Date of Birth: Dec 26, 1925  Today's Date: 01/17/2017  Session 1 SLP Individual Time: 0830-0900 SLP Individual Time Calculation (min): 30 min Session 2 SLP Individual Time: 8127-5170 SLP Individual Time Calculation (min): 27 min Patient missed 33 minutes due to fatigue and medical issues   Short Term Goals: Week 1: SLP Short Term Goal 1 (Week 1): Pt will tolerate regular textures trials with supervision use of compensatory swallow strategies.  SLP Short Term Goal 2 (Week 1): Pt will utilize external memory aids to recall new, daily information with Mod A cues.  SLP Short Term Goal 3 (Week 1): Pt will answer orientation questions with Mod A cues.  SLP Short Term Goal 4 (Week 1): Pt will sustain attention to functional tasks for ~ 15 minutes with Min A cues for redirection. SLP Short Term Goal 5 (Week 1): Pt will complete basic functional task with Mod A cues.  SLP Short Term Goal 6 (Week 1): Pt will state 1 physical and 1 cognitive deficit related to new CVA with Mod A cues.   Skilled Therapeutic Interventions: Session 1 Skilled treatment session focused on addressing cognition goals. Upon SLP arrival patient reading the newspaper and was bale to sate orientation information Mod I with use of the external aid.  SLP also facilitated session by providing set-up of schedule, which patient utilized with Min-Mod assist verbal and visual cues to locate all the details such as start times and duration.  Patient able to recall that he has a stroke today and that his walking has been affected.  He reports that his thinking is the same and his wife did all the remembering, which is why he reports a strong desire to return home to his wife as soon as possible.  Continue with current plan of care.   Session 2 Skilled treatment session focused on addressing cognition goals. Patient up in chair and completed  basic functional problem solving with use of wheelchair breaks and repositioning/relocating self in chair with Mod assist question cues to maximize safety with task.  SLP ordered an early tray for skilled observation of upgraded regular textures and thin liquids; however, after a few minutes of seated conversation patient became visibly short of breath and right upper extremity started shaking without the patient being able to control it.  Vitals were obtained and documented by nurse tech and RN was made aware.  Patient reported feeling very tired and wanted to return to bed.  RN present to assist with transfer back to bed and upon return to bed patient began gagging.  Patient repositioned upright in case of vomiting; however, none observed.  PA present to assess patient and as a result, session ended early due to fatigue and medical issues.  As a result, upgraded trial tray was unable to be observed.  Continue with current plan of care.     Function:  Cognition Comprehension Comprehension assist level: Understands basic 75 - 89% of the time/ requires cueing 10 - 24% of the time  Expression   Expression assist level: Expresses basic needs/ideas: With extra time/assistive device  Social Interaction Social Interaction assist level: Interacts appropriately 50 - 74% of the time - May be physically or verbally inappropriate.  Problem Solving Problem solving assist level: Solves basic 50 - 74% of the time/requires cueing 25 - 49% of the time  Memory Memory assist level: Recognizes or recalls 50 - 74% of the time/requires  cueing 25 - 49% of the time    Pain Pain Assessment Pain Assessment: No/denies pain x2  Therapy/Group: Individual Therapy x2  Gunnar Fusi, M.A., [place] [phone]  [place] 01/17/2017, 12:20 PM

## 2017-01-17 NOTE — Consult Note (Signed)
Cardiology Consultation:   Patient ID: Thomas [name]; [id number]; May 02, 1926   Admit date: 01/09/2017 Date of Consult: 01/17/2017  Primary Care Provider: Marin Olp, MD Primary Cardiologist: Dr Percival Spanish   Patient Profile:   Thomas [name] is a 83 y.o. male with a hx of atrial fibrilation who is being seen today for the evaluation of tachycardia with symptoms at the request of Dr Letta Pate.  History of Present Illness:   Thomas [name] is an 83 y/o male referred to Dr Percival Spanish in Dec 2017 for incidental finding of rate controlled atrial fibrillation. He was placed on Eliquis-CHA2DS2 VASc=3. Ane echo showed preserved LVF. Other pertinent PMH include a history of mesothelioma and chronic Lt pleur effusion. His life expectancy is less than 6 months. He was admitted 01/09/17 after a Rt brain stroke. He was taken off Eliquis and placed on ASA. He is seen today on the Rehab floor.  The pt has been participating in [place]. He has had a few episodes of "feeling nauseated". It may last an hour.  He did admit to me that he noted some tachycardia with these. The APP examined him during one of these and noted an irregular HR. EKG reveals AF with VR 77. In reviewing his past EKG it appears he has been in AF since Dec 2017.   Past Medical History:  Diagnosis Date  . Adenomatous colon polyp 2009  . Allergy   . Carotid artery occlusion   . Diverticulosis of colon (without mention of hemorrhage)   . ED (erectile dysfunction)   . Esophageal stricture   . Esophagitis   . GERD (gastroesophageal reflux disease)   . Goals of care, counseling/discussion 12/23/2016  . Hiatal hernia   . Hypertension   . Prostate cancer ([place])   . Unspecified gastritis and gastroduodenitis without mention of hemorrhage     Past Surgical History:  Procedure Laterality Date  . ENDARTERECTOMY Left 03/05/2013   Procedure: ENDARTERECTOMY CAROTID;  Surgeon: Conrad Claypool Hill, MD;  Location: [place];  Service: Vascular;   Laterality: Left;  . ESOPHAGOGASTRODUODENOSCOPY (EGD) WITH ESOPHAGEAL DILATION    . PROSTATE SURGERY     Laser surgery     Inpatient Medications: Scheduled Meds: . amLODipine  10 mg Oral Daily  . aspirin EC  325 mg Oral Daily  . cloNIDine  0.1 mg Oral TID  . heparin  5,000 Units Subcutaneous Q8H  . metoprolol tartrate  12.5 mg Oral BID  . [START ON 01/18/2017] rosuvastatin  10 mg Oral q1800  . sulfamethoxazole-trimethoprim  1 tablet Oral Q12H   Continuous Infusions:  PRN Meds: acetaminophen, alum & mag hydroxide-simeth, bisacodyl, diphenhydrAMINE, guaiFENesin-dextromethorphan, ipratropium-albuterol, polyethylene glycol, prochlorperazine **OR** prochlorperazine **OR** prochlorperazine, sodium phosphate, traZODone  Allergies:    Allergies  Allergen Reactions  . Ace Inhibitors Hives  . Penicillins Swelling    Swelling  of the face. Has patient had a PCN reaction causing immediate rash, facial/tongue/throat swelling, SOB or lightheadedness with hypotension: YES Has patient had a PCN reaction causing severe rash involving mucus membranes or skin necrosis: NO Has patient had a PCN reaction that required hospitalizationNO Has patient had a PCN reaction occurring within the last 10 years: NO If all of the above answers are "NO", then may proceed with Cephalosporin use.    Social History:   Social History   Social History  . Marital status: Married    Spouse name: N/A  . Number of children: N/A  . Years of education: N/A   Occupational History  .  Not on file.   Social History Main Topics  . Smoking status: Former Smoker    Packs/day: 0.50    Years: 4.00    Types: Cigarettes    Quit date: 12/21/1953  . Smokeless tobacco: Never Used  . Alcohol use 0.0 oz/week     Comment: daily--one alcohol drink a day  . Drug use: No  . Sexual activity: Not on file   Other Topics Concern  . Not on file   Social History Narrative   Married 1948. 6 children. 9 grandkids. 2  greatgrandkids.  (One son was killed in the [place])      [employer]- honorable discharge.       Hobbies: golfing about twice a month.     Family History:   The patient's family history includes Breast cancer in his sister; Cancer in his mother; Heart disease in his father; Hyperlipidemia in his daughter; Hypertension in his daughter and son.  ROS:  Please see the history of present illness.  ROS  All other ROS reviewed and negative.     Physical Exam/Data:   Vitals:   01/16/17 2153 01/17/17 0544 01/17/17 0548 01/17/17 1118  BP: (!) 183/96 (!) 163/86  (!) 147/99  Pulse: (!) 111 67  76  Resp:  18  19  Temp:  97.5 F (36.4 C)  98 F (36.7 C)  TempSrc:  Oral  Oral  SpO2:  100%  99%  Weight:  150 lb 9.6 oz (68.3 kg) 151 lb 0.2 oz (68.5 kg)   Height:        Intake/Output Summary (Last 24 hours) at 01/17/17 1338 Last data filed at 01/17/17 0700  Gross per 24 hour  Intake              440 ml  Output                0 ml  Net              440 ml   Filed Weights   01/12/17 1921 01/17/17 0544 01/17/17 0548  Weight: 164 lb 12.8 oz (74.8 kg) 150 lb 9.6 oz (68.3 kg) 151 lb 0.2 oz (68.5 kg)   Body mass index is 21.06 kg/m.  General:  Chronically ill appearing AA male, NAD in wheel chair HEENT: normal Lymph: no adenopathy Neck: no JVD, LCE scar Endocrine:  No thryomegaly Vascular: No carotid bruits; FA pulses 2+ bilaterally without bruits  Cardiac:  irreg irreg without murmur Lungs:  Absent breath sounds 1/2 on Lt with dullness to percussion, Rt clear Abd: soft, nontender, no hepatomegaly  Ext: no edema Musculoskeletal:  No deformities, BUE and BLE strength normal and equal Skin: warm and dry  Neuro:  Grossly intact, some speech deficit noted Psych:  Normal affect    EKG:  The EKG was personally reviewed and demonstrates AF with CVR- QTc 495  Relevant CV Studies: Echo 01/06/17- Study Conclusions  - Left ventricle: The cavity size  was normal. Wall thickness was   increased in a pattern of mild LVH. Systolic function was normal.   The estimated ejection fraction was in the range of 50% to 55%.   Wall motion was normal; there were no regional wall motion   abnormalities. - Aortic valve: There was mild regurgitation. - Mitral valve: Calcified annulus. There was mild regurgitation. - Left atrium: The atrium was mildly dilated. - Pericardium, extracardiac: There was a pleural effusion.  Impressions:  - Normal LV systolic  function; mild LVH; sclerotic aortic valve   with mild AI; mild MR; mild LAE.   Laboratory Data:  Chemistry Recent Labs Lab 01/16/17 0459  NA 136  K 3.6  CL 99*  CO2 27  GLUCOSE 127*  BUN 16  CREATININE 1.30*  CALCIUM 9.0  GFRNONAA 47*  GFRAA 54*  ANIONGAP 10    No results for input(s): PROT, ALBUMIN, AST, ALT, ALKPHOS, BILITOT in the last 168 hours. Hematology Recent Labs Lab 01/15/17 0907 01/16/17 0459  WBC 6.1 4.9  RBC 4.77 4.33  HGB 13.4 12.0*  HCT 42.7 37.7*  MCV 89.5 87.1  MCH 28.1 27.7  MCHC 31.4 31.8  RDW 16.3* 16.1*  PLT 334 285   Cardiac EnzymesNo results for input(s): TROPONINI in the last 168 hours. No results for input(s): TROPIPOC in the last 168 hours.  BNPNo results for input(s): BNP, PROBNP in the last 168 hours.  DDimer No results for input(s): DDIMER in the last 168 hours.  Radiology/Studies:  No results found.  Assessment and Plan:   1. AF- this appear to be chronic and rate controlled. Since the EKG was done during a symptomatic episode today and his rate was 77-doubt this is the cause of his symptoms.  2. Mesothelioma- chronic Lt effusion- Palliative care-DNR  3. HTN- permissive HTN post stroke per Neurology service  4. Chronic anticoagulation- Eliquis changed to ASA post stroke  Plan: Etiology of these episodes not clear, he would require an OP monitor. Probably OK to stop Crestor. Continue current Rx for HTN and low dose Lopressor. MD to  see   Signed, Kerin Ransom, PA-C  01/17/2017 1:38 PM   History and all data above reviewed.  Patient examined.  I agree with the findings as above.  The patient has had known chronic atrial fib.  He currently is off DOAC but this is to be restarted in a few days post stroke.  He wants to go home.  He is in rehab and has been ambulating with a walker.  However, he is noted to have episodes of his right arm starting to shake.  He then has panting.  His heart was irregular and he did have atrial fib on an EKG.  Rate was controlled.  He really does not recall these events.   He denies any palpitations or presyncope.  He does not report syncope.  He has no pain.  He reports that his gait is off.   The patient exam reveals COR:RRR  ,  Lungs: Clear  ,  Abd: Positive bowel sounds, no rebound no guarding, Ext No edema  .  All available labs, radiology testing, previous records reviewed. Agree with documented assessment and plan. Atrial fib:  I do not think that this was the cause of his episodes.  He is in this permanently.  His rate appears to be controlled.  He denies chest pain.  He has no resting SOB per his report.   At this point I am not expecting that he will require any further in patient testing or change in therapy.  His primary service is allowing some permissive HTN.    Thomas [name]  3:04 PM  01/17/2017

## 2017-01-17 NOTE — Progress Notes (Signed)
Social Work Patient ID: Thomas [name], male   DOB: 07/05/1926, 83 y.o.   MRN: [id number]   CSW met with pt, pt's wife, and pt's son to update them on team conference discussion.  Also spoke with both of pt's dtrs re: d/c disposition.  Family is trying to determine the best d/c plan for pt, especially given that pt's wife is not in the best of health right now.  Explained that if pt were to d/c home, he would go 01-26-17, but dtrs are considering SNF possibility.  CSW offering support to family as they make these decisions.  Plan to speak with them again tomorrow as they talk with their 4th sibling.  CSW will continue to follow and assist as needed.  

## 2017-01-17 NOTE — Progress Notes (Signed)
Patient has had a couple of episodes lasting 30- 60 seconds where he has ? tremors RUE followed by panting and malaise. Doubt seizure activity and feel could be related to anxiety. He reported nausea this am after episode and felt very fatigued. Sounded to be in Afib-confirmed by EKG. Will get cardiology input.

## 2017-01-18 ENCOUNTER — Inpatient Hospital Stay (HOSPITAL_COMMUNITY): Payer: Medicare Other | Admitting: Physical Therapy

## 2017-01-18 ENCOUNTER — Inpatient Hospital Stay (HOSPITAL_COMMUNITY): Payer: Medicare Other

## 2017-01-18 ENCOUNTER — Inpatient Hospital Stay (HOSPITAL_COMMUNITY): Payer: Medicare Other | Admitting: Speech Pathology

## 2017-01-18 ENCOUNTER — Other Ambulatory Visit: Payer: Self-pay

## 2017-01-18 ENCOUNTER — Inpatient Hospital Stay (HOSPITAL_COMMUNITY)
Admission: AD | Admit: 2017-01-18 | Discharge: 2017-01-23 | DRG: 064 | Disposition: A | Discharge status: Skilled Nursing Facility | Payer: Medicare Other | Source: Ambulatory Visit | Attending: Family Medicine | Admitting: Family Medicine

## 2017-01-18 ENCOUNTER — Inpatient Hospital Stay (HOSPITAL_COMMUNITY): Payer: Medicare Other | Admitting: Occupational Therapy

## 2017-01-18 DIAGNOSIS — K219 Gastro-esophageal reflux disease without esophagitis: Secondary | ICD-10-CM | POA: Diagnosis present

## 2017-01-18 DIAGNOSIS — R269 Unspecified abnormalities of gait and mobility: Secondary | ICD-10-CM

## 2017-01-18 DIAGNOSIS — R471 Dysarthria and anarthria: Secondary | ICD-10-CM | POA: Diagnosis present

## 2017-01-18 DIAGNOSIS — Z8546 Personal history of malignant neoplasm of prostate: Secondary | ICD-10-CM | POA: Diagnosis not present

## 2017-01-18 DIAGNOSIS — C450 Mesothelioma of pleura: Secondary | ICD-10-CM | POA: Diagnosis not present

## 2017-01-18 DIAGNOSIS — Z8589 Personal history of malignant neoplasm of other organs and systems: Secondary | ICD-10-CM | POA: Diagnosis not present

## 2017-01-18 DIAGNOSIS — R1312 Dysphagia, oropharyngeal phase: Secondary | ICD-10-CM | POA: Diagnosis present

## 2017-01-18 DIAGNOSIS — I69398 Other sequelae of cerebral infarction: Secondary | ICD-10-CM

## 2017-01-18 DIAGNOSIS — I639 Cerebral infarction, unspecified: Secondary | ICD-10-CM | POA: Diagnosis not present

## 2017-01-18 DIAGNOSIS — J910 Malignant pleural effusion: Secondary | ICD-10-CM | POA: Diagnosis not present

## 2017-01-18 DIAGNOSIS — R29810 Facial weakness: Secondary | ICD-10-CM | POA: Diagnosis present

## 2017-01-18 DIAGNOSIS — I69391 Dysphagia following cerebral infarction: Secondary | ICD-10-CM | POA: Diagnosis not present

## 2017-01-18 DIAGNOSIS — E1169 Type 2 diabetes mellitus with other specified complication: Secondary | ICD-10-CM | POA: Diagnosis not present

## 2017-01-18 DIAGNOSIS — Z66 Do not resuscitate: Secondary | ICD-10-CM | POA: Diagnosis present

## 2017-01-18 DIAGNOSIS — N390 Urinary tract infection, site not specified: Secondary | ICD-10-CM | POA: Diagnosis present

## 2017-01-18 DIAGNOSIS — I4821 Permanent atrial fibrillation: Secondary | ICD-10-CM | POA: Diagnosis present

## 2017-01-18 DIAGNOSIS — I10 Essential (primary) hypertension: Secondary | ICD-10-CM | POA: Diagnosis present

## 2017-01-18 DIAGNOSIS — R270 Ataxia, unspecified: Secondary | ICD-10-CM | POA: Diagnosis not present

## 2017-01-18 DIAGNOSIS — I69393 Ataxia following cerebral infarction: Secondary | ICD-10-CM | POA: Diagnosis not present

## 2017-01-18 DIAGNOSIS — Z87891 Personal history of nicotine dependence: Secondary | ICD-10-CM | POA: Diagnosis not present

## 2017-01-18 DIAGNOSIS — I63431 Cerebral infarction due to embolism of right posterior cerebral artery: Principal | ICD-10-CM | POA: Diagnosis present

## 2017-01-18 DIAGNOSIS — Z8249 Family history of ischemic heart disease and other diseases of the circulatory system: Secondary | ICD-10-CM | POA: Diagnosis not present

## 2017-01-18 DIAGNOSIS — E1151 Type 2 diabetes mellitus with diabetic peripheral angiopathy without gangrene: Secondary | ICD-10-CM | POA: Diagnosis present

## 2017-01-18 DIAGNOSIS — I6322 Cerebral infarction due to unspecified occlusion or stenosis of basilar arteries: Secondary | ICD-10-CM | POA: Diagnosis present

## 2017-01-18 DIAGNOSIS — D62 Acute posthemorrhagic anemia: Secondary | ICD-10-CM | POA: Diagnosis not present

## 2017-01-18 DIAGNOSIS — Z7901 Long term (current) use of anticoagulants: Secondary | ICD-10-CM

## 2017-01-18 DIAGNOSIS — G9340 Encephalopathy, unspecified: Secondary | ICD-10-CM | POA: Diagnosis present

## 2017-01-18 DIAGNOSIS — T17908A Unspecified foreign body in respiratory tract, part unspecified causing other injury, initial encounter: Secondary | ICD-10-CM

## 2017-01-18 DIAGNOSIS — I482 Chronic atrial fibrillation: Secondary | ICD-10-CM | POA: Diagnosis present

## 2017-01-18 DIAGNOSIS — E785 Hyperlipidemia, unspecified: Secondary | ICD-10-CM | POA: Diagnosis present

## 2017-01-18 DIAGNOSIS — R4182 Altered mental status, unspecified: Secondary | ICD-10-CM

## 2017-01-18 DIAGNOSIS — R29707 NIHSS score 7: Secondary | ICD-10-CM | POA: Diagnosis present

## 2017-01-18 DIAGNOSIS — E784 Other hyperlipidemia: Secondary | ICD-10-CM | POA: Diagnosis not present

## 2017-01-18 LAB — COMPREHENSIVE METABOLIC PANEL
ALK PHOS: 69 U/L (ref 38–126)
ALT: 21 U/L (ref 17–63)
ANION GAP: 10 (ref 5–15)
AST: 18 U/L (ref 15–41)
Albumin: 2.9 g/dL — ABNORMAL LOW (ref 3.5–5.0)
BILIRUBIN TOTAL: 0.6 mg/dL (ref 0.3–1.2)
BUN: 22 mg/dL — ABNORMAL HIGH (ref 6–20)
CALCIUM: 8.9 mg/dL (ref 8.9–10.3)
CO2: 24 mmol/L (ref 22–32)
Chloride: 102 mmol/L (ref 101–111)
Creatinine, Ser: 1.51 mg/dL — ABNORMAL HIGH (ref 0.61–1.24)
GFR, EST AFRICAN AMERICAN: 45 mL/min — AB (ref >60)
GFR, EST NON AFRICAN AMERICAN: 39 mL/min — AB (ref >60)
GLUCOSE: 120 mg/dL — AB (ref 65–99)
Potassium: 3.6 mmol/L (ref 3.5–5.1)
Sodium: 136 mmol/L (ref 135–145)
TOTAL PROTEIN: 6.8 g/dL (ref 6.5–8.1)

## 2017-01-18 LAB — CBC WITH DIFFERENTIAL/PLATELET
BASOS PCT: 0 %
Basophils Absolute: 0 10*3/uL (ref 0.0–0.1)
EOS ABS: 0 10*3/uL (ref 0.0–0.7)
EOS PCT: 0 %
HCT: 35.6 % — ABNORMAL LOW (ref 39.0–52.0)
Hemoglobin: 11.5 g/dL — ABNORMAL LOW (ref 13.0–17.0)
LYMPHS ABS: 0.7 10*3/uL (ref 0.7–4.0)
Lymphocytes Relative: 13 %
MCH: 28.4 pg (ref 26.0–34.0)
MCHC: 32.3 g/dL (ref 30.0–36.0)
MCV: 87.9 fL (ref 78.0–100.0)
Monocytes Absolute: 0.7 10*3/uL (ref 0.1–1.0)
Monocytes Relative: 14 %
Neutro Abs: 3.6 10*3/uL (ref 1.7–7.7)
Neutrophils Relative %: 73 %
PLATELETS: 295 10*3/uL (ref 150–400)
RBC: 4.05 MIL/uL — AB (ref 4.22–5.81)
RDW: 16.7 % — ABNORMAL HIGH (ref 11.5–15.5)
WBC: 5 10*3/uL (ref 4.0–10.5)

## 2017-01-18 LAB — MAGNESIUM: MAGNESIUM: 2 mg/dL (ref 1.7–2.4)

## 2017-01-18 LAB — PHOSPHORUS: Phosphorus: 4.3 mg/dL (ref 2.5–4.6)

## 2017-01-18 LAB — LACTIC ACID, PLASMA: Lactic Acid, Venous: 1.1 mmol/L (ref 0.5–1.9)

## 2017-01-18 LAB — TSH: TSH: 2.406 u[IU]/mL (ref 0.350–4.500)

## 2017-01-18 MED ORDER — ACETAMINOPHEN 650 MG RE SUPP: 650.0000 mg | Freq: Four times a day (QID) | RECTAL | Status: DC | PRN

## 2017-01-18 MED ORDER — SODIUM CHLORIDE 0.9% FLUSH
3.0000 mL | Freq: Two times a day (BID) | INTRAVENOUS | Status: DC
  Administered 2017-01-18 – 2017-01-19 (×2): 3 mL via INTRAVENOUS

## 2017-01-18 MED ORDER — SODIUM CHLORIDE 0.9 % IV SOLN: INTRAVENOUS | Status: DC

## 2017-01-18 MED ORDER — ONDANSETRON HCL 4 MG PO TABS: 4.0000 mg | ORAL_TABLET | Freq: Four times a day (QID) | ORAL | Status: DC | PRN

## 2017-01-18 MED ORDER — ROSUVASTATIN CALCIUM 5 MG PO TABS
10.0000 mg | ORAL_TABLET | Freq: Every day | ORAL | Status: DC
  Administered 2017-01-19 – 2017-01-22 (×4): 10 mg via ORAL
  Filled 2017-01-18 (×4): qty 2

## 2017-01-18 MED ORDER — METOPROLOL TARTRATE 5 MG/5ML IV SOLN: 5.0000 mg | Freq: Three times a day (TID) | INTRAVENOUS | Status: DC | PRN

## 2017-01-18 MED ORDER — SODIUM CHLORIDE 0.9 % IV SOLN
INTRAVENOUS | Status: DC
  Administered 2017-01-19: 01:00:00 via INTRAVENOUS

## 2017-01-18 MED ORDER — HYDRALAZINE HCL 20 MG/ML IJ SOLN
10.0000 mg | Freq: Three times a day (TID) | INTRAMUSCULAR | Status: DC | PRN
Start: 2017-01-18 — End: 2017-01-23
  Administered 2017-01-22: 10 mg via INTRAVENOUS
  Filled 2017-01-18 (×2): qty 1

## 2017-01-18 MED ORDER — ONDANSETRON HCL 4 MG/2ML IJ SOLN: 4.0000 mg | Freq: Four times a day (QID) | INTRAMUSCULAR | Status: DC | PRN

## 2017-01-18 MED ORDER — INSULIN ASPART 100 UNIT/ML ~~LOC~~ SOLN
0.0000 [IU] | Freq: Three times a day (TID) | SUBCUTANEOUS | Status: DC
  Administered 2017-01-19 – 2017-01-21 (×4): 1 [IU] via SUBCUTANEOUS
  Administered 2017-01-22: 2 [IU] via SUBCUTANEOUS
  Administered 2017-01-22 (×2): 1 [IU] via SUBCUTANEOUS
  Administered 2017-01-23: 2 [IU] via SUBCUTANEOUS

## 2017-01-18 MED ORDER — ACETAMINOPHEN 325 MG PO TABS: 650.0000 mg | ORAL_TABLET | Freq: Four times a day (QID) | ORAL | Status: DC | PRN

## 2017-01-18 NOTE — Progress Notes (Signed)
[facility]     PROGRESS NOTE    Subjective/Complaints: .  No issues overnite, no shaking or tremor this am Appreciate cardiology consult  ROS: pt denies nausea, vomiting, diarrhea, cough, shortness of breath or chest pain    Objective: Vital Signs: Blood pressure (!) 153/75, pulse 75, temperature 98.4 F (36.9 C), temperature source Oral, resp. rate 18, height 5\' 11"  (1.803 m), weight 68.5 kg (151 lb 0.2 oz), SpO2 100 %. No results found.  Recent Labs  01/15/17 0907 01/16/17 0459  WBC 6.1 4.9  HGB 13.4 12.0*  HCT 42.7 37.7*  PLT 334 285    Recent Labs  01/16/17 0459  NA 136  K 3.6  CL 99*  GLUCOSE 127*  BUN 16  CREATININE 1.30*  CALCIUM 9.0   CBG (last 3)  No results for input(s): GLUCAP in the last 72 hours.  Wt Readings from Last 3 Encounters:  01/17/17 68.5 kg (151 lb 0.2 oz)  01/05/17 72.6 kg (160 lb 0.9 oz)  12/22/16 75.3 kg (166 lb)    Physical Exam:  Constitutional: He is oriented to person, place, and time. He appears well-developedand well-nourished.  HENT:  Head: Normocephalicand atraumatic.  Mouth/Throat: Oropharynx is clear and moist.  Eyes: Conjunctivaeand EOMare normal. Pupils are equal, round, and reactive to light.  Neck: Normal range of motion. Neck supple.  Cardiovascular:  RRR  Respiratory:  Lungs clear, normal effort Abd: soft/ nt Musculoskeletal: He exhibits no edemaor tenderness.  Neurological: He is alertand oriented to person, place, and time.  Dysarthric but intelligible  Able to follow simple commands without difficulty. Dysmetria with right finger to nose more than left. Sensory deficits RUE and RLE present.  Motor: 4+/5 throughout bilateral UE and LE's  Skin: Skin is warmand dry. No rashnoted. No erythema.  Psychiatric: He has a normal mood and affect. His behavior is normal. Thought contentnormal.   Assessment/Plan: 1. Right sided ataxia, balance and cognitive/swallowing  deficits secondary to right cerebellar and pontine infarcts which require 3+ hours per day of interdisciplinary therapy in a comprehensive inpatient rehab setting. Physiatrist is providing close team supervision and 24 hour management of active medical problems listed below. Physiatrist and rehab team continue to assess barriers to discharge/monitor patient progress toward functional and medical goals.  Function:  Bathing Bathing position Bathing activity did not occur: Refused Position: Production manager parts bathed by patient: Right arm, Left arm, Chest, Abdomen, Right upper leg, Left upper leg, Right lower leg, Left lower leg, Front perineal area, Buttocks Body parts bathed by helper: Back  Bathing assist Assist Level: Touching or steadying assistance(Pt > 75%)      Upper Body Dressing/Undressing Upper body dressing   What is the patient wearing?: Pull over shirt/dress     Pull over shirt/dress - Perfomed by patient: Thread/unthread right sleeve, Thread/unthread left sleeve, Put head through opening, Pull shirt over trunk          Upper body assist Assist Level: Supervision or verbal cues   Set up : To obtain clothing/put away  Lower Body Dressing/Undressing Lower body dressing   What is the patient wearing?: Pants, Socks, Shoes, Underwear Underwear - Performed by patient: Thread/unthread right underwear leg, Thread/unthread left underwear leg, Pull underwear up/down   Pants- Performed by patient: Thread/unthread right pants leg, Thread/unthread left pants leg, Pull pants up/down   Non-skid slipper socks- Performed by patient: Don/doff right sock, Don/doff left sock   Socks - Performed by patient:  Don/doff right sock, Don/doff left sock   Shoes - Performed by patient: Don/doff right shoe, Don/doff left shoe, Fasten right, Fasten left            Lower body assist Assist for lower body dressing: Touching or steadying assistance (Pt > 75%)       Toileting Toileting Toileting activity did not occur: No continent bowel/bladder event Toileting steps completed by patient: Performs perineal hygiene Toileting steps completed by helper: Adjust clothing prior to toileting, Adjust clothing after toileting Toileting Assistive Devices: Grab bar or rail  Toileting assist Assist level: Touching or steadying assistance (Pt.75%)   Transfers Chair/bed transfer Chair/bed transfer activity did not occur: Safety/medical concerns Chair/bed transfer method: Squat pivot Chair/bed transfer assist level: Supervision or verbal cues Chair/bed transfer assistive device: Armrests     Locomotion Ambulation     Max distance: 30 Assist level: Moderate assist (Pt 50 - 74%)   Wheelchair   Type: Manual Max wheelchair distance: 135ft  Assist Level: Supervision or verbal cues  Cognition Comprehension Comprehension assist level: Understands basic 75 - 89% of the time/ requires cueing 10 - 24% of the time  Expression Expression assist level: Expresses basic needs/ideas: With extra time/assistive device  Social Interaction Social Interaction assist level: Interacts appropriately 50 - 74% of the time - May be physically or verbally inappropriate.  Problem Solving Problem solving assist level: Solves basic 50 - 74% of the time/requires cueing 25 - 49% of the time  Memory Memory assist level: Recognizes or recalls 50 - 74% of the time/requires cueing 25 - 49% of the time   Medical Problem List and Plan: 1. Balance, swallowing, communication and functional deficitssecondary to right cerebellar and pontine infarcts CIRPT, OT- Discussed need for 24/7 sup post d/c    2. DVT Prophylaxis/Anticoagulation: Pharmaceutical: Heparin 3. Pain Management: N/A 4. Mood: Has a positive outlook and supportive family. LCSW to follow for evaluation and support.  5. Neuropsych: This patient iscapable of making decisions on hisown behalf. 6. Skin/Wound Care:  routine pressure relief measures.  7. Fluids/Electrolytes/Nutrition: Monitor I/O.    -encourage PO 8. HTN: improvement with increased norvasc. However some increase over last 12 hours  -continue norvasc at 10mg  daily , was on scheduled clonidine at home will resume monitor for hypotension Vitals:   01/17/17 2049 01/18/17 0455  BP: 136/77 (!) 153/75  Pulse: 76 75  Resp:  18  Temp:  98.4 F (36.9 C)    -prn clonidine for severe spikes 9. Malignant mesothelioma with recurrent pleural effusion: Lifespan of less than 6 months.  10. A fib: Off Eliquis--to be resumed 2 wks post CVA ~ June 8 per Neuro, check CT head as well prior to resuming  11. Hypokalemia: resolved with supplement.  12. T2DM with Hgb A1C-6.9: Fasting/random BS have ranged from 110 -130.     -dietary education  13. Dyslipidemia: On Crestor.  14.  UTI on bactrim, sens  LOS (Days) 9 A FACE TO FACE EVALUATION WAS PERFORMED  Charlett Blake, MD 01/18/2017 8:09 AM

## 2017-01-18 NOTE — Progress Notes (Signed)
Occupational Therapy Weekly Progress Note  Patient Details  Name: Thomas [name] MRN: [id number] Date of Birth: 12/26/1925  Beginning of progress report period: Jan 10, 2017 End of progress report period: January 18, 2017  Today's Date: 01/18/2017 OT Individual Time: 5573-2202 OT Individual Time Calculation (min): 60 min    Patient has met 1 of 3 short term goals.  Pt making steady progress towards OT goals.He has been inconsistent with performance during reporting period requiring mod- supervision standing balance and ambulation during functional tasks.  He cont to be most limited by decreased standing balance and cognitive deficits. He displays little to no carry over of education/ technique with ADL tasks/ RW management within session and requires frequent cuing for safety awareness. [name] has been present during rehab stay and have been made aware of pt's deficits and his need for close 24 hr supervision assist at d/c due to cognition and balance. Family is unsure at this time if they will be able to provide needed assist and pt may have to d/c to SNF. Pt with little insight into significance of deficits and functional/ safety implications.   Patient continues to demonstrate the following deficits: cognitive deficits and muscle weakness (generalized) and therefore will continue to benefit from skilled OT intervention to enhance overall performance with BADL and Reduce care partner burden.  Patient progressing toward long term goals..  Continue plan of care.  OT Short Term Goals Week 1:  OT Short Term Goal 1 (Week 1): Pt will stand with supervision to complete 2 grooming tasks at sink OT Short Term Goal 1 - Progress (Week 1): Not met OT Short Term Goal 2 (Week 1): Pt will ambulate into bathroom with min A in prep for ADL task OT Short Term Goal 2 - Progress (Week 1): Progressing toward goal OT Short Term Goal 3 (Week 1): Pt will complete 3/3 toileting tasks with CGA OT Short Term Goal 3 -  Progress (Week 1): Met Week 2:  OT Short Term Goal 1 (Week 2): STG=LTG due to LOS  Skilled Therapeutic Interventions/Progress Updates:    Pt seen for OT ADL bathing/dressing session. Pt sitting up in bed upon arrival, just finished breakfast andd agreeable to tx session. He ambulated throughout session using RW with min-mod A with VCs for RW management in functional context. Pt with no carry over of education from previous sessions or within session for hand placement on RW during sit <> stands. He bathed seated on tub bench, required VCs for attention to all body parts during showering task, washing only UEs and chest and saying he was finished. He returned to EOB to dress, required assist with threading of underwear as pt with decreased insight/ problem solving into how to change positioning for increased success. He stood with min A to pull pants up, pt with R lean requiring steadying assist for balance. He donned socks and shoes with significantly increased time. Socks placed on incorrectly, however, pt not aware until therapist corrected and he stated "wow, that's a lot more comfortable".   He completed grooming tasks standing at sink, required multimodal cuing for upright posture. Pt noted with decreased coordination this session, constantly dropping and knocking over self care items.  He returned to recliner at end of session, mod A for ambulation and max cuing for safety as pt attempting to sit prior to reaching chair and sitting on therapist's thigh. Reviewed with pt therapist's concerns regarding going home with just elderly wife to assist as he can  require physical assist due to poor safety awareness, unclear if pt understand severity of his deficits. Pt left seated in recliner at end of session, QRB donned, chair alarm on and all needs in reach.    Therapy Documentation Precautions:  Precautions Precautions: Fall Restrictions Weight Bearing Restrictions: No Pain:   No/ denies  pain  See Function Navigator for Current Functional Status.   Therapy/Group: Individual Therapy  Lewis, Keisuke Hollabaugh C 01/18/2017, 7:06 AM

## 2017-01-18 NOTE — Progress Notes (Signed)
Progress Note  Patient Name: Thomas [name] Date of Encounter: 01/18/2017  Primary Cardiologist: Dr. Percival Spanish  Subjective   The patient had another episode of AMS.  By report at PT there was an unresponsive episode followed by garbled speech.  He was not on tele at that time.    Inpatient Medications    Scheduled Meds: . insulin aspart  0-9 Units Subcutaneous TID WC  . rosuvastatin  10 mg Oral q1800  . sodium chloride flush  3 mL Intravenous Q12H   Continuous Infusions:  PRN Meds: acetaminophen **OR** acetaminophen, hydrALAZINE, metoprolol tartrate, ondansetron **OR** ondansetron (ZOFRAN) IV   Vital Signs    Vitals:   01/18/17 1407  BP: 119/66  Pulse: 63  Resp: 16  Temp: 98 F (36.7 C)  TempSrc: Oral  SpO2: 94%   No intake or output data in the 24 hours ending 01/18/17 1714 There were no vitals filed for this visit.  Telemetry    NA - Personally Reviewed  ECG    Atrial fib with slow ventricular rate. No acute ST T wave changes.   - Personally Reviewed  Physical Exam   GEN: No acute distress.   Neck: No  JVD Cardiac: IrregularRR, no murmurs, rubs, or gallops.  Respiratory: Clear  to auscultation bilaterally. GI: Soft, nontender, non-distended  MS: No  edema; No deformity. Neuro:  Nonfocal  Psych: Normal affect but confused and keeps asking when he can go home..   Labs    Chemistry Recent Labs Lab 01/16/17 0459  NA 136  K 3.6  CL 99*  CO2 27  GLUCOSE 127*  BUN 16  CREATININE 1.30*  CALCIUM 9.0  GFRNONAA 47*  GFRAA 54*  ANIONGAP 10     Hematology Recent Labs Lab 01/15/17 0907 01/16/17 0459 01/18/17 1614  WBC 6.1 4.9 5.0  RBC 4.77 4.33 4.05*  HGB 13.4 12.0* 11.5*  HCT 42.7 37.7* 35.6*  MCV 89.5 87.1 87.9  MCH 28.1 27.7 28.4  MCHC 31.4 31.8 32.3  RDW 16.3* 16.1* 16.7*  PLT 334 285 295    Cardiac EnzymesNo results for input(s): TROPONINI in the last 168 hours. No results for input(s): TROPIPOC in the last 168 hours.   BNPNo  results for input(s): BNP, PROBNP in the last 168 hours.   DDimer No results for input(s): DDIMER in the last 168 hours.   Radiology    Ct Head Wo Contrast  Result Date: 01/18/2017 CLINICAL DATA:  Pt got lethargic and confused after therapy today. Hx of stroke. When breathing pt is moving his head. Unable to hold head still. EXAM: CT HEAD WITHOUT CONTRAST TECHNIQUE: Contiguous axial images were obtained from the base of the skull through the vertex without intravenous contrast. COMPARISON:  MRI 01/06/2017 and earlier studies FINDINGS: Brain: There is central and cortical atrophy. Periventricular white matter changes are consistent with small vessel disease. Subacute right cerebellar infarct is again noted. Right pontine infarct is better seen on recent MRI exam. Remote bilateral occipital lobe infarcts appear stable. No interval changes to indicate presence of acute infarct. There is no intra or extra-axial fluid collection or mass. Vascular: There is atherosclerotic calcification of the carotid siphons. Skull: Normal. Negative for fracture or focal lesion. Sinuses/Orbits: Bilateral mastoid effusions are present. Unremarkable appearance of the orbits and paranasal sinuses. Other: Study quality is degraded by patient motion artifact. IMPRESSION: 1. Subacute right cerebellar infarct, stable in appearance and not associated with hemorrhage. 2. Remote bilateral occipital infarcts. 3. Significant periventricular white matter changes  and atrophy. 4.  No evidence for acute intracranial abnormality. 5. Bilateral mastoid effusions. Electronically Signed   By: Nolon Nations M.D.   On: 01/18/2017 12:35    Cardiac Studies   NA  Patient Profile   82 y.o. male with a history of atrial fibrillation. He was placed on Eliquis-CHA2DS2 VASc=3. Ane echo showed preserved LVF. Other pertinent PMH include a history of mesothelioma and chronic Lt pleur effusion. His life expectancy is less than 6 months. He was admitted  01/09/17 after a Rt brain stroke. He was taken off Eliquis and placed on ASA. He was seen yesterday after having AMS.  EKG with atrial fib which has been chronic.   Assessment & Plan    UNRESPONSIVENESS:   Head CT is not acute.  EKG is atrial fib with slow ventricular response.  He is transferred to be on a monitor but this has not been started.  He does not remember the event.    Signed, Minus Breeding, MD  01/18/2017, 5:14 PM

## 2017-01-18 NOTE — Progress Notes (Signed)
Physical Therapy Session Note  Patient Details  Name: Thomas [name] MRN: [id number] Date of Birth: 1926/07/03  Today's Date: 01/18/2017 PT Individual Time: 1005-1102 PT Individual Time Calculation (min): 57 min   Short Term Goals: Week 2:  PT Short Term Goal 1 (Week 2): Stg=Ltg due to ELOS  Skilled Therapeutic Interventions/Progress Updates:   Pt received sitting in recliner and agreeable to PT.. Stand pivot transfer with RW and Mod Assist. Pt instructed in WC mobility x13ft with supervision assist and min cues for increased use of LUE. Sit<>stand transfer training with RW x 6 throughout treatment with min cues from for safety and min assist. Gait training with emphasis on increased step length and weight shift to the L. Completed x 2 while stepping to reciprocal targets on floor. Pt instructed in standing weight shift following gait. While weight shifting, pt's speed became slurred and pt become minimally responsive. Pt also noted to have difficulty breathing. PT assessed Vital signs while pt in minimally responsive BP: 154/77. HR 58-93bpm(fluctuating on Dinamap), SpO2 100%. Minimally responsive episode lasted 45-60 seconds . Pt returned to baseline after episode. Reports increased fatigue when returned to baseline   Pt returned to room and performed min assist squat pivot to recliner. RN and PA made aware of pt condition. Pt left with speech therapy following treatment, who is also aware of pt condition.       Therapy Documentation Precautions:  Precautions Precautions: Fall Restrictions Weight Bearing Restrictions: No Vital Signs: Therapy Vitals Pulse Rate: 78 Resp: 20 BP: 110/62 Patient Position (if appropriate): Sitting  Pain: none   See Function Navigator for Current Functional Status.   Therapy/Group: Individual Therapy  Lorie Phenix 01/18/2017, 12:22 PM

## 2017-01-18 NOTE — Progress Notes (Signed)
Pt transferred to 5M10. Pt family at bedside.

## 2017-01-18 NOTE — Progress Notes (Signed)
Speech Language Pathology Daily Session Note  Patient Details  Name: Thomas [name] MRN: [id number] Date of Birth: 14-Jun-1926  Today's Date: 01/18/2017 SLP Individual Time: 1100-1115 SLP Individual Time Calculation (min): 15 min - missed 45 minutes to EKG  Short Term Goals: Week 2: SLP Short Term Goal 1 (Week 2): Pt will tolerate regular textures trials with Supervision use of compensatory swallow strategies.  SLP Short Term Goal 2 (Week 2): Pt will utilize external aids for re-orientation and recall of daily information with Supervision cues.  SLP Short Term Goal 3 (Week 2): Pt will sustain attention to functional tasks for ~ 15 minutes with Supervision cues for redirection. SLP Short Term Goal 4 (Week 2): Patient will demonstrate use of call bell to request assist with Min A question cues. SLP Short Term Goal 5 (Week 2): Pt will complete basic functional task with Min A cues.   Skilled Therapeutic Interventions: Skilled treatment session focused on cognition goals. SLP facilitiated session by attempting to provide cues for orientation information. Pt noted to be holding saliva in mouth, decreased left facial movements and grabbled unintelligible speech. SLP notified nursing. Nursing and PA arrived to room, pt began vomiting and SLP assisted with transferring pt to bed for EKG. Pt left in nursing care.      Function:    Cognition Comprehension Comprehension assist level: Understands basic 25 - 49% of the time/ requires cueing 50 - 75% of the time  Expression   Expression assist level: Expresses basic 25 - 49% of the time/requires cueing 50 - 75% of the time. Uses single words/gestures.  Social Interaction Social Interaction assist level: Interacts appropriately 25 - 49% of time - Needs frequent redirection.  Problem Solving Problem solving assist level: Solves basic 25 - 49% of the time - needs direction more than half the time to initiate, plan or complete simple activities  Memory  Memory assist level: Recognizes or recalls 25 - 49% of the time/requires cueing 50 - 75% of the time    Pain Pain Assessment Pain Assessment: No/denies pain  Therapy/Group: Individual Therapy  Thomas [name] 01/18/2017, 11:47 AM

## 2017-01-18 NOTE — Discharge Summary (Signed)
Physician Discharge Summary  Patient ID: Thomas [name] MRN: [id number] DOB/AGE: [birth year] 83 y.o.  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 (HCC) Active Problems:   GERD (gastroesophageal reflux disease)   Carotid stenosis   Malignant mesothelioma of pleura (HCC)   Benign essential HTN   Acute blood loss anemia   Ataxia   Discharged Condition: serious   Significant Diagnostic Studies:   Labs:  Basic Metabolic Panel: BMP Latest Ref Rng & Units 01/16/2017 01/10/2017 01/07/2017  Glucose 65 - 99 mg/dL 127(H) 114(H) 130(H)  BUN 6 - 20 mg/dL 16 10 18   Creatinine 0.61 - 1.24 mg/dL 1.30(H) 1.23 1.10  Sodium 135 - 145 mmol/L 136 136 137  Potassium 3.5 - 5.1 mmol/L 3.6 3.8 3.8  Chloride 101 - 111 mmol/L 99(L) 100(L) 107  CO2 22 - 32 mmol/L 27 26 22   Calcium 8.9 - 10.3 mg/dL 9.0 9.1 8.7(L)    CBC: CBC Latest Ref Rng & Units 01/16/2017 01/15/2017 01/10/2017  WBC 4.0 - 10.5 K/uL 4.9 6.1 4.8  Hemoglobin 13.0 - 17.0 g/dL 12.0(L) 13.4 12.6(L)  Hematocrit 39.0 - 52.0 % 37.7(L) 42.7 40.0  Platelets 150 - 400 K/uL 285 334 283    CBG: No results for input(s): GLUCAP in the last 168 hours.   Brief HPI:   Thomas Johnsonis a 83 y.o.malewith history of HTN, esophageal stricture, PAF, recurrent left pleural effusion, malignant mesothelioma (less than 6 moths life expectancy), 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 History taken from chart review and family. CTA head/neck showed acute to subacute right cerebellar infarct with interval occlusion of right V3 and V4 segments with intermittent tenuous blood flow in basilar and bilateral PCA and severe atherosclerosis of bilateral cervical carotids with left mid ICA intimal flap causing 50% stenosis. MRI brain reviewed, showing acute right cerebellar and pontine infarcts with  petechial hemorrhage and stable old bilateral occipital and left frontal/parietal infarcts. Eliquis changed to ASA due to size of stroke and concerns of hemorrhagic conversion with recommendations to resume in two weeks. MBS done revealing delayed swallow with sensed aspiration of thins and diet has been advanced to dysphagia 3, thin liquids. Patient with resultant balance deficits with ataxia and CIR recommended for follow up therapy   Hospital Course: Marques Ericson was admitted to rehab 01/09/2017 for inpatient therapies to consist of PT, ST and OT at least three hours five days a week. Past admission physiatrist, therapy team and rehab RN have worked together to provide customized collaborative inpatient rehab. Blood pressures have been monitored on bid basis and have been poorly controlled. Norvasc was titrated upwards and clonidine was added on 6/6 for tighter control. He has been tolerating dysphagia 3 diet with thin liquids without s/s of aspiration. He was noted to have increase in confusion and lethargy on 6/4 due to UTI and was started on septra. Urine culture was positive for >100,000 E coli. Check of lytes shows rise in SCr to 1.3 --likely baseline. He has had brief episode of unresponsiveness question due to presyncope. He had an episode on today followed by slurred speech and difficulty handling oral secretions. EKG showed A flutter with heart rate of 58. Dr. Warren Lacy consulted and recommended monitoring on telemetry. Dr. Erlinda Hong was consulted for input and recommended CT head to rule out bleed and if negative to start Eliquis due to concerns of recurrent stroke  embolic event.  His blood pressures were noted to be trending down therefore he was started on IVF and  recived 250 cc bolus. Dr. Aggie Moats was contacted to transfer patient to acute hospital and medical management. Stat CT head ordered and results pending at this.  He was discharged to acute on 01/18/17     Disposition: Transfer to  telemetry  Diet: Needs full supervision- dysphagia 3, thin liquids.   Current Medications: . amLODipine  10 mg Oral Daily  . aspirin EC  325 mg Oral Daily  . cloNIDine  0.1 mg Oral TID  . heparin  5,000 Units Subcutaneous Q8H  . rosuvastatin  10 mg Oral q1800  . sulfamethoxazole-trimethoprim  1 tablet Oral Q12H     Allergies as of 01/18/2017      Reactions   Ace Inhibitors Hives   Penicillins Swelling   Swelling  of the face. Has patient had a PCN reaction causing immediate rash, facial/tongue/throat swelling, SOB or lightheadedness with hypotension: YES Has patient had a PCN reaction causing severe rash involving mucus membranes or skin necrosis: NO Has patient had a PCN reaction that required hospitalizationNO Has patient had a PCN reaction occurring within the last 10 years: NO If all of the above answers are "NO", then may proceed with Cephalosporin use.       SignedBary Leriche 01/18/2017, 12:40 PM

## 2017-01-18 NOTE — Consult Note (Signed)
Neurology Consultation Reason for Consult: Episode of altered mental status Referring Physician: Arlyn Leak  CC: Altered mental status  History is obtained from: Patient, chart, family  HPI: Thomas [name] is a 83 y.o. male with a history of recent hospitalization due to vertebral artery occlusion he has had now 2 episodes of altered mental status. Today, he was found to be relatively unresponsive with garbled speech and there was concern that he might be in atrial flutter, though I'm not sure if this was confirmed. I also don't see any evidence of hypotension based on the vital readings that we have.  He had garbled speech following this episode, and his family states that his speech still has not improved point that it was yesterday.  He states that he is feeling much better, but notices that his left side seems slightly weak.   ROS: A 14 point ROS was performed and is negative except as noted in the HPI.   Past Medical History:  Diagnosis Date  . Adenomatous colon polyp 2009  . Allergy   . Carotid artery occlusion   . Diverticulosis of colon (without mention of hemorrhage)   . ED (erectile dysfunction)   . Esophageal stricture   . Esophagitis   . GERD (gastroesophageal reflux disease)   . Goals of care, counseling/discussion 12/23/2016  . Hiatal hernia   . Hypertension   . Prostate cancer ([place])   . Unspecified gastritis and gastroduodenitis without mention of hemorrhage      Family History  Problem Relation Age of Onset  . Heart disease Father        unclear specifics  . Cancer Mother   . Breast cancer Sister   . Hyperlipidemia Daughter   . Hypertension Daughter   . Hypertension Son      Social History:  reports that he quit smoking about 63 years ago. His smoking use included Cigarettes. He has a 2.00 pack-year smoking history. He has never used smokeless tobacco. He reports that he drinks alcohol. He reports that he does not use drugs.   Exam: Current vital  signs: BP 139/77 (BP Location: Right Arm)   Pulse 70   Temp 97.8 F (36.6 C) (Oral)   Resp 17   SpO2 100%  Vital signs in last 24 hours: Temp:  [97.8 F (36.6 C)-98.4 F (36.9 C)] 97.8 F (36.6 C) (06/07 2026) Pulse Rate:  [63-78] 70 (06/07 2026) Resp:  [16-20] 17 (06/07 2026) BP: (110-153)/(62-99) 139/77 (06/07 2026) SpO2:  [94 %-100 %] 100 % (06/07 2026)   Physical Exam  Constitutional: Appears well-developed and well-nourished.  Psych: Affect appropriate to situation Eyes: No scleral injection HENT: No OP obstrucion Head: Normocephalic.  Cardiovascular: Normal rate and regular rhythm.  Respiratory: Effort normal and breath sounds normal to anterior ascultation GI: Soft.  No distension. There is no tenderness.  Skin: WDI  Neuro: Mental Status: Patient is awake, alert, oriented to person, place, month, but not year. Patient is able to give a clear and coherent history. No signs of aphasia or neglect Cranial Nerves: II: Visual Fields are full. Pupils are equal, round, and reactive to light.   III,IV, VI: EOMI without ptosis or diploplia.  V: Facial sensation is symmetric to temperature VII: Facial movement is symmetric.  VIII: hearing is intact to voice X: Uvula elevates symmetrically XI: Shoulder shrug is symmetric. XII: tongue is midline without atrophy or fasciculations.  Motor: Tone is normal. Bulk is normal. 5/5 strength was present on the right side, 4+/5  strength in the left arm and 5 minus/5 in the left leg Sensory: Sensation is symmetric to light touch and temperature in the arms and legs. Cerebellar: He appears to have difficulty with finger-nose-finger and heel-knee-shin bilaterally  I have reviewed labs in epic and the results pertinent to this consultation are: CMP-borderline creatinine  I have reviewed the images obtained: CT head-negative  Impression: 83 year old male with recent stroke with basilar insufficiency and episode of altered mental  status with persistent worsened dysarthria. I do wonder if he did drop his pressures, but there is no evidence directly supporting this. He has been on anticoagulation in the past, and is now several weeks out from his stroke. As long as he doesn't have progressive infarct, I would favor starting anticoagulation at this time.  Recommendations: 1) MRI brain 2) anticoagulation depending on results of MRI brain.   Roland Rack, MD [facility] [phone]  If 7pm- 7am, please page neurology on call as listed in [place].

## 2017-01-18 NOTE — Progress Notes (Signed)
Pt arrived on floor from 4W. Pt arrived with family. Pt alert and made aware of where he was. Call button and room phone within reach. RN will continue to monitor.

## 2017-01-18 NOTE — Progress Notes (Signed)
Patient with brief unresponsive episode during PT followed by episode of garbled speech.   Taken to room and continued to intermittent garbled speech with some difficulty handling oral secretions. EKG shows A flutter. Cardiology amion paged.   Neurology consulted due to concerns of embolic event. Dr. Erlinda Hong evaluated MRI and felt that most of bleed had almost resolved but to go ahead and check stat CT to rule out bleed and if negative to start Eliquis to prevent ongoing strokes.

## 2017-01-18 NOTE — Progress Notes (Signed)
Speech Language Pathology Discharge Patient Details Name: Thomas [name] MRN: [id number] DOB: 11/08/1925 Today's Date: 01/18/2017  Pt discharged to another unit d/t decline in medical condition.  Primrose Oler B. Rutherford Nail, M.S., CCC-SLP Speech-Language Pathologist      [place] Doig 01/18/2017, 3:38 PM

## 2017-01-18 NOTE — Progress Notes (Signed)
Pt participating in therapy this morning and RN notified by PT that pt had episode of nausea and shortness of breath. While RN was communicating with PT, Speech therapist called nurse to room urgently. Pt found in chair with mumbled speech. Pt transferred to bed. Vitals are within normal for patient's baseline. EKG was performed and results were given to Reesa Chew, [facility]. Reesa Chew contacted rapid response. Family notified of event and are currently at bedside. Pt is currently in CT. IV started and pt started on IV fluids. Rapid response RN is with patient. Will continue to monitor pt status.

## 2017-01-18 NOTE — H&P (Signed)
[facility] History and Physical  Thomas [name] MOQ:[id number] DOB: 1925/11/30 DOA: 01/18/2017  Referring physician:  PCP: Marin Olp, MD   Chief Complaint: AMS  HPI: Thomas [name] is a 83 y.o. male  past history significant for esophageal stricture, hypertension, prostate cancer and stroke presents as a direct admission from inpatient rehabilitation for responsiveness. Patient had been an inpatient rehabilitation since 5/29. Patient was actively being treated for UTI with oral Bactrim. In the early afternoon patient was found to be unresponsive. With some stimulation he responded but not appropriately. There was concern for possible stroke the patient was sent to CT. CT scan of the head was negative for new acute stroke. Cardiology was consult by the rehabilitation attending. Decision was made that patient directly admitted back to the acute care hospital setting for evaluation of his unresponsiveness and what appeared to be atrial flutter on EKG.   Review of Systems:  As per HPI otherwise 10 point review of systems negative.    Past Medical History:  Diagnosis Date  . Adenomatous colon polyp 2009  . Allergy   . Carotid artery occlusion   . Diverticulosis of colon (without mention of hemorrhage)   . ED (erectile dysfunction)   . Esophageal stricture   . Esophagitis   . GERD (gastroesophageal reflux disease)   . Goals of care, counseling/discussion 12/23/2016  . Hiatal hernia   . Hypertension   . Prostate cancer ([place])   . Unspecified gastritis and gastroduodenitis without mention of hemorrhage    Past Surgical History:  Procedure Laterality Date  . ENDARTERECTOMY Left 03/05/2013   Procedure: ENDARTERECTOMY CAROTID;  Surgeon: Conrad Brightwaters, MD;  Location: [facility];  Service: Vascular;  Laterality: Left;  . ESOPHAGOGASTRODUODENOSCOPY (EGD) WITH ESOPHAGEAL DILATION    . PROSTATE SURGERY     Laser surgery   Social History:  reports that he quit smoking about 63 years  ago. His smoking use included Cigarettes. He has a 2.00 pack-year smoking history. He has never used smokeless tobacco. He reports that he drinks alcohol. He reports that he does not use drugs.  Allergies  Allergen Reactions  . Ace Inhibitors Hives  . Penicillins Swelling    Swelling  of the face. Has patient had a PCN reaction causing immediate rash, facial/tongue/throat swelling, SOB or lightheadedness with hypotension: YES Has patient had a PCN reaction causing severe rash involving mucus membranes or skin necrosis: NO Has patient had a PCN reaction that required hospitalizationNO Has patient had a PCN reaction occurring within the last 10 years: NO If all of the above answers are "NO", then may proceed with Cephalosporin use.    Family History  Problem Relation Age of Onset  . Heart disease Father        unclear specifics  . Cancer Mother   . Breast cancer Sister   . Hyperlipidemia Daughter   . Hypertension Daughter   . Hypertension Son      Prior to Admission medications   Medication Sig Start Date End Date Taking? Authorizing Provider  amLODipine (NORVASC) 5 MG tablet Take 1 tablet (5 mg total) by mouth daily. 10/16/16   Almyra Deforest, PA  apixaban (ELIQUIS) 5 MG TABS tablet Take 5 mg by mouth 2 (two) times daily.    [provider]  cloNIDine (CATAPRES) 0.1 MG tablet TAKE ONE TABLET BY MOUTH THREE TIMES DAILY 09/18/16   Dorena Cookey, MD  metoCLOPramide (REGLAN) 5 MG tablet Take 1 tablet (5 mg total) by mouth every 8 (  eight) hours as needed for nausea. 12/16/16   Dhungel, Nishant, MD  rosuvastatin (CRESTOR) 10 MG tablet Take 1 tablet (10 mg total) by mouth daily. 05/29/16   Marin Olp, MD   Physical Exam: Vitals:   01/18/17 1407  BP: 119/66  Pulse: 63  Resp: 16  Temp: 98 F (36.7 C)  TempSrc: Oral  SpO2: 94%    Wt Readings from Last 3 Encounters:  01/17/17 68.5 kg (151 lb 0.2 oz)  01/05/17 72.6 kg (160 lb 0.9 oz)  12/22/16 75.3 kg (166 lb)    General:   Appears calm and comfortable, A&Ox3 but needed prompting for date and place Eyes:  PERRL, EOMI, normal lids, iris ENT:  grossly normal hearing, lips & tongue Neck:  no LAD, masses or thyromegaly Cardiovascular:  RRR, no m/r/g. No LE edema.  Respiratory:  CTA bilaterally, no w/r/r. Normal respiratory effort. Abdomen:  soft, ntnd Skin:  no rash or induration seen on limited exam Musculoskeletal:  grossly normal tone BUE/BLE Psychiatric:  grossly normal mood and affect, speech fluent and appropriate Neurologic:  CN 2-12 grossly intact, moves all extremities in coordinated fashion.          Labs on Admission:  Basic Metabolic Panel:  Recent Labs Lab 01/16/17 0459  NA 136  K 3.6  CL 99*  CO2 27  GLUCOSE 127*  BUN 16  CREATININE 1.30*  CALCIUM 9.0   Liver Function Tests: No results for input(s): AST, ALT, ALKPHOS, BILITOT, PROT, ALBUMIN in the last 168 hours. No results for input(s): LIPASE, AMYLASE in the last 168 hours. No results for input(s): AMMONIA in the last 168 hours. CBC:  Recent Labs Lab 01/15/17 0907 01/16/17 0459  WBC 6.1 4.9  NEUTROABS 3.9  --   HGB 13.4 12.0*  HCT 42.7 37.7*  MCV 89.5 87.1  PLT 334 285   Cardiac Enzymes: No results for input(s): CKTOTAL, CKMB, CKMBINDEX, TROPONINI in the last 168 hours.  BNP (last 3 results) No results for input(s): BNP in the last 8760 hours.  ProBNP (last 3 results)  Recent Labs  02/16/16 1003  PROBNP 69.0     Serum creatinine: 1.3 mg/dL (H) 01/16/17 0459 Estimated creatinine clearance: 37.3 mL/min (A)  CBG: No results for input(s): GLUCAP in the last 168 hours.  Radiological Exams on Admission: Ct Head Wo Contrast  Result Date: 01/18/2017 CLINICAL DATA:  Pt got lethargic and confused after therapy today. Hx of stroke. When breathing pt is moving his head. Unable to hold head still. EXAM: CT HEAD WITHOUT CONTRAST TECHNIQUE: Contiguous axial images were obtained from the base of the skull through the  vertex without intravenous contrast. COMPARISON:  MRI 01/06/2017 and earlier studies FINDINGS: Brain: There is central and cortical atrophy. Periventricular white matter changes are consistent with small vessel disease. Subacute right cerebellar infarct is again noted. Right pontine infarct is better seen on recent MRI exam. Remote bilateral occipital lobe infarcts appear stable. No interval changes to indicate presence of acute infarct. There is no intra or extra-axial fluid collection or mass. Vascular: There is atherosclerotic calcification of the carotid siphons. Skull: Normal. Negative for fracture or focal lesion. Sinuses/Orbits: Bilateral mastoid effusions are present. Unremarkable appearance of the orbits and paranasal sinuses. Other: Study quality is degraded by patient motion artifact. IMPRESSION: 1. Subacute right cerebellar infarct, stable in appearance and not associated with hemorrhage. 2. Remote bilateral occipital infarcts. 3. Significant periventricular white matter changes and atrophy. 4.  No evidence for acute intracranial abnormality. 5.  Bilateral mastoid effusions. Electronically Signed   By: Nolon Nations M.D.   On: 01/18/2017 12:35    EKG: Independently reviewed. Afib. No STEMI.  Assessment/Plan Principal Problem:   Acute encephalopathy Active Problems:   Hyperlipidemia   Essential hypertension   GERD (gastroesophageal reflux disease)   Permanent atrial fibrillation (HCC)   Type 2 diabetes mellitus with hyperlipidemia (HCC)   Dysphagia, post-stroke   Stroke due to embolism of right posterior cerebral artery (HCC)   Acute encephalopathy Checking chest x-ray, urinalysis, blood culture CT head negative for new acute stroke Checking basic labs Checking TSH Neurology consult, likely MRI tonight  Dysarthria/Dysphagia Speech-language pathology consult Continue IV fluids Nothing by mouth  Aspiration Chest x-ray now & tomorrow morning Continuous pulse  ox  Hypertension When necessary hydralazine 10 mg IV as needed for severe blood pressure  Atrial fibrillation When necessary Lopressor for heart rate over 110  Diabetes SS Insulin before meals & at bedtime  Hyperlipidemia Continue statin  UTI Cont bactrim Get new UA   Code Status: DNR except med  DVT Prophylaxis: SCD Family Communication: dgtrs at bedside Disposition Plan: Pending Improvement  Status: inpt tele  Elwin Mocha, MD Family Medicine [facility] [email or web address] Password [email or web address]

## 2017-01-18 NOTE — Progress Notes (Signed)
Social Work Discharge Note  The overall goal for the admission was met for:   Discharge location: No - Pt had to transfer to [facility] due to medical decline  Length of Stay: No - 9 days  Discharge activity level: No - min to mod assist - goals were set for supervision  Home/community participation: No - transfer to acute hospital  Services provided included: MD, RD, PT, OT, SLP, RN, Pharmacy and English: Medicare - [employer]  Follow-up services arranged: Other: Pt to acute hospital, so no services arranged at this time.  Comments (or additional information):  CSW spoke with Dr. Letta Pate and then with Danne Baxter, RN/Admissions Coordinator to talk about pt's disposition after acute hospitalization.  Dr. Letta Pate feels that if pt can tolerate the program and family wants to take pt home after CIR, then his readmission to CIR for more therapy and family education is appropriate.  If pt needs to go to SNF, then he feels pt should transfer to SNF straight from acute.  Pamala Hurry is aware of the above and will follow pt on acute.  Patient/Family verbalized understanding of follow-up arrangements: Other : CSW did not meet with pt/family due to pt's medical decline and their concern over pt's condition.  Individual responsible for coordination of the follow-up plan: pt's children  Confirmed correct DME delivered: Trey Sailors 01/18/2017    Adin Laker, Silvestre Mesi

## 2017-01-18 NOTE — Significant Event (Signed)
Rapid Response Event Note  Overview:Called by PA Pam Love to assist with patient with neuro changes Time Called: 1127 Arrival Time: 1135 Event Type: Neurologic  Initial Focused Assessment:  Patient supine in bed - responds to voice - follows commands - speech slurred - significantly worse per RN Vikki Ports and Algis Liming PA who are at bedside.  Denies headache.  Neuro at baseline except for speech per staff report.  12 lead EKG done - afib/flutter - not new.  Patient has been off Eliquis .     Interventions:   Manual BP done - right arm 110/62 HR 65 RR 12 O2 sats 95% on RA.  BP has been running 150/or better. Keeping HOB elevated due to secretions and airway protection.  Stat CT head ordered. Placed on heart monitor for transport.   NS infusion started - 100cc/hr.  Transported to CT scan without incident.  Coughing up thick clear secretions.  Return from CT scan - Bp 106/65 HR 68 - Pam Love PA aware - NS 250cc bolus started per order.  Family presented.  Updated per Jeannene Patella and [place].  Handoff to [facility] [facility].    Plan of Care (if not transferred):  To transfer to telemetry.  To call RRT as needed.    Event Summary: Name of Physician Notified: Algis Liming PA at    Name of Consulting Physician Notified: Dr. Aggie Moats at (S)  (per Algis Liming PA)  Outcome: Transferred (Comment)  Event End Time: [place]  Quin Hoop

## 2017-01-19 ENCOUNTER — Inpatient Hospital Stay (HOSPITAL_COMMUNITY): Payer: Medicare Other

## 2017-01-19 ENCOUNTER — Inpatient Hospital Stay (HOSPITAL_COMMUNITY): Payer: Medicare Other | Admitting: Occupational Therapy

## 2017-01-19 DIAGNOSIS — I10 Essential (primary) hypertension: Secondary | ICD-10-CM

## 2017-01-19 DIAGNOSIS — E784 Other hyperlipidemia: Secondary | ICD-10-CM

## 2017-01-19 DIAGNOSIS — I69391 Dysphagia following cerebral infarction: Secondary | ICD-10-CM

## 2017-01-19 DIAGNOSIS — I6322 Cerebral infarction due to unspecified occlusion or stenosis of basilar arteries: Secondary | ICD-10-CM

## 2017-01-19 DIAGNOSIS — K219 Gastro-esophageal reflux disease without esophagitis: Secondary | ICD-10-CM

## 2017-01-19 DIAGNOSIS — I482 Chronic atrial fibrillation: Secondary | ICD-10-CM

## 2017-01-19 LAB — URINALYSIS, ROUTINE W REFLEX MICROSCOPIC
Bilirubin Urine: NEGATIVE
GLUCOSE, UA: NEGATIVE mg/dL
Hgb urine dipstick: NEGATIVE
KETONES UR: NEGATIVE mg/dL
LEUKOCYTES UA: NEGATIVE
NITRITE: NEGATIVE
Protein, ur: NEGATIVE mg/dL
Specific Gravity, Urine: 1.01 (ref 1.005–1.030)
pH: 7 (ref 5.0–8.0)

## 2017-01-19 LAB — CBC
HEMATOCRIT: 38.9 % — AB (ref 39.0–52.0)
HEMOGLOBIN: 12.2 g/dL — AB (ref 13.0–17.0)
MCH: 27.5 pg (ref 26.0–34.0)
MCHC: 31.4 g/dL (ref 30.0–36.0)
MCV: 87.6 fL (ref 78.0–100.0)
Platelets: 285 10*3/uL (ref 150–400)
RBC: 4.44 MIL/uL (ref 4.22–5.81)
RDW: 16.5 % — ABNORMAL HIGH (ref 11.5–15.5)
WBC: 4.9 10*3/uL (ref 4.0–10.5)

## 2017-01-19 LAB — GLUCOSE, CAPILLARY
GLUCOSE-CAPILLARY: 113 mg/dL — AB (ref 65–99)
GLUCOSE-CAPILLARY: 93 mg/dL (ref 65–99)
GLUCOSE-CAPILLARY: 99 mg/dL (ref 65–99)
GLUCOSE-CAPILLARY: 143 mg/dL — AB (ref 65–99)
GLUCOSE-CAPILLARY: 190 mg/dL — AB (ref 65–99)
Glucose-Capillary: 96 mg/dL (ref 65–99)
Glucose-Capillary: 106 mg/dL — ABNORMAL HIGH (ref 65–99)
Glucose-Capillary: 99 mg/dL (ref 65–99)

## 2017-01-19 LAB — BASIC METABOLIC PANEL
ANION GAP: 13 (ref 5–15)
BUN: 19 mg/dL (ref 6–20)
CALCIUM: 8.9 mg/dL (ref 8.9–10.3)
CO2: 20 mmol/L — ABNORMAL LOW (ref 22–32)
Chloride: 103 mmol/L (ref 101–111)
Creatinine, Ser: 1.31 mg/dL — ABNORMAL HIGH (ref 0.61–1.24)
GFR, EST AFRICAN AMERICAN: 54 mL/min — AB (ref >60)
GFR, EST NON AFRICAN AMERICAN: 47 mL/min — AB (ref >60)
GLUCOSE: 103 mg/dL — AB (ref 65–99)
POTASSIUM: 3.6 mmol/L (ref 3.5–5.1)
Sodium: 136 mmol/L (ref 135–145)

## 2017-01-19 MED ORDER — ASPIRIN EC 81 MG PO TBEC
81.0000 mg | DELAYED_RELEASE_TABLET | Freq: Every day | ORAL | Status: DC
  Administered 2017-01-19 – 2017-01-23 (×5): 81 mg via ORAL
  Filled 2017-01-19 (×5): qty 1

## 2017-01-19 MED ORDER — APIXABAN 5 MG PO TABS
5.0000 mg | ORAL_TABLET | Freq: Two times a day (BID) | ORAL | Status: DC
  Administered 2017-01-19 – 2017-01-23 (×8): 5 mg via ORAL
  Filled 2017-01-19 (×8): qty 1

## 2017-01-19 MED ORDER — ALPRAZOLAM 0.25 MG PO TABS
0.2500 mg | ORAL_TABLET | Freq: Once | ORAL | Status: DC
  Filled 2017-01-19 (×2): qty 1

## 2017-01-19 MED ORDER — STROKE: EARLY STAGES OF RECOVERY BOOK
Freq: Once | Status: AC
  Administered 2017-01-19: 13:00:00

## 2017-01-19 NOTE — Progress Notes (Signed)
STROKE TEAM PROGRESS NOTE   HISTORY OF PRESENT ILLNESS (per record) Thomas [name] is a 83 y.o. male with a history of recent hospitalization due to vertebral artery occlusion who has had now 2 episodes of altered mental status. Today 01/18/2017, he was found to be relatively unresponsive with garbled speech and there was concern that he might be in atrial flutter, though not sure if this was confirmed. No evidence of hypotension based on the vital readings. He had garbled speech following this episode, and his family states that his speech still has not improved to the point that it was yesterday. He states that he is feeling much better, but notices that his left side seems slightly weak. Patient was not administered IV t-PA. He was admitted for further evaluation and treatment.   SUBJECTIVE (INTERVAL HISTORY) Pt wife and son are at bedside. Pt awake alert and feels much improved. Still has left facial droop and left foot weakness. MRI showed right midbrain infarct, which is new. Orthostatic vital unremarkable.   OBJECTIVE Temp:  [97.5 F (36.4 C)-98.4 F (36.9 C)] 98.4 F (36.9 C) (06/08 0940) Pulse Rate:  [63-78] 74 (06/08 0940) Cardiac Rhythm: Atrial fibrillation (06/08 0700) Resp:  [16-20] 18 (06/08 0940) BP: (110-150)/(62-99) 150/86 (06/08 0940) SpO2:  [94 %-100 %] 99 % (06/08 0940)  CBC:  Recent Labs Lab 01/15/17 0907  01/18/17 1614 01/19/17 0431  WBC 6.1  < > 5.0 4.9  NEUTROABS 3.9  --  3.6  --   HGB 13.4  < > 11.5* 12.2*  HCT 42.7  < > 35.6* 38.9*  MCV 89.5  < > 87.9 87.6  PLT 334  < > 295 285  < > = values in this interval not displayed.  Basic Metabolic Panel:  Recent Labs Lab 01/18/17 1614 01/19/17 0431  NA 136 136  K 3.6 3.6  CL 102 103  CO2 24 20*  GLUCOSE 120* 103*  BUN 22* 19  CREATININE 1.51* 1.31*  CALCIUM 8.9 8.9  MG 2.0  --   PHOS 4.3  --     Lipid Panel:    Component Value Date/Time   CHOL 131 01/06/2017 0213   TRIG 91 01/06/2017 0213   HDL  28 (L) 01/06/2017 0213   CHOLHDL 4.7 01/06/2017 0213   VLDL 18 01/06/2017 0213   LDLCALC 85 01/06/2017 0213   HgbA1c:  Lab Results  Component Value Date   HGBA1C 6.9 (H) 01/06/2017   Urine Drug Screen:    Component Value Date/Time   LABOPIA NONE DETECTED 01/05/2017 1920   COCAINSCRNUR NONE DETECTED 01/05/2017 1920   LABBENZ NONE DETECTED 01/05/2017 1920   AMPHETMU NONE DETECTED 01/05/2017 1920   THCU NONE DETECTED 01/05/2017 1920   LABBARB NONE DETECTED 01/05/2017 1920    Alcohol Level     Component Value Date/Time   ETH <5 01/05/2017 1226    IMAGING I have personally reviewed the radiological images below and agree with the radiology interpretations.  Ct Head Wo Contrast 01/18/2017 1. Subacute right cerebellar infarct, stable in appearance and not associated with hemorrhage. 2. Remote bilateral occipital infarcts. 3. Significant periventricular white matter changes and atrophy. 4.  No evidence for acute intracranial abnormality. 5. Bilateral mastoid effusions.   Mr Brain Wo Contrast 01/19/2017 1. New 12 mm acute ischemic nonhemorrhagic infarct at the lateral right midbrain/right corticospinal tract, with punctate 3 mm right thalamic infarct. 2. Normal expected interval evolution of subacute right cerebellar and brainstem infarcts. 3. Otherwise stable appearance of the brain  with remote bilateral occipital lobe, left frontal, left parietal lobe infarcts. Moderate to severe chronic small vessel ischemic disease.   Dg Chest Port 1 View 01/19/2017 Opacification of left hemithorax stable from the prior study.   01/18/2017 Stable appearance of the left hemithorax. No evidence for acute abnormality of the right lung. Retained contrast following previous barium study 12 days ago. Consider KUB for further evaluation.    PHYSICAL EXAM  Temp:  [97.5 F (36.4 C)-98.4 F (36.9 C)] 98.2 F (36.8 C) (06/08 1353) Pulse Rate:  [70-76] 73 (06/08 1353) Resp:  [17-19] 18 (06/08 1353) BP:  (127-154)/(76-86) 154/80 (06/08 1353) SpO2:  [96 %-100 %] 100 % (06/08 1353)  General - Well nourished, well developed, in no apparent distress.  Ophthalmologic - Fundi not visualized due to noncooperation.  Cardiovascular - irregularly irregular heart rate and rhythm.  Mental Status -  Level of arousal and orientation to month, place, and person were intact, however, not orientated to year. Language including expression, naming, repetition, comprehension was assessed and found intact, but mild dysarthria.  Cranial Nerves II - XII - II - Visual field intact OU, however, decreased visual acuity. III, IV, VI - Extraocular movements intact. V - Facial sensation intact bilaterally. VII - left nasolabial fold flattening. VIII - Hearing & vestibular intact bilaterally. X - Palate elevates symmetrically. XI - Chin turning & shoulder shrug intact bilaterally. XII - Tongue protrusion intact.  Motor Strength - The patient's strength was normal in all extremities except left foot DF 4-/5 and pronator drift was absent.  Bulk was normal and fasciculations were absent.   Motor Tone - Muscle tone was assessed at the neck and appendages and was normal.  Reflexes - The patient's reflexes were 1+ in all extremities and he had no pathological reflexes.  Sensory - Light touch, temperature/pinprick were assessed and were symmetrical.    Coordination - The patient had mild dysmetria in both hands with FTN test.  Tremor was absent.  Gait and Station - deferred.   ASSESSMENT/PLAN [name] is a 83 y.o. male with history of esophageal stricture, hypertension, prostate cancer and recent stroke presenting from IP rehab with altered mental status on CIR alone with UTI on Bactrim. He did not receive IV t-PA.   Stroke:   New R midbrain and R thalamic infarcts felt to be due to severe VA, BA and PCA stenosis.  Resultant  Left facial droop and left foot weakness  CT head subacute R cerebellar  infarct. Old B occipital infarcts. Periventricular white matter disease. Atrophy.  MRI head R midbrain and R thalamic infarct. Interval evolution subacute R cerebellar and brainstem infarcts. Mod to severe small vessel disease.   Eliquis  for VTE prophylaxis  Diet NPO time specified  Eliquis (apixaban) daily prior to admission, now on Eliquis 5mg  bid. Recommend to add ASA 81mg  daily for further stroke prevention.   Patient counseled to be compliant with his antithrombotic medications  Ongoing aggressive stroke risk factor management  Therapy recommendations:  CIR  Disposition:  pending   Atrial Fibrillation  Home anticoagulation:  Eliquis (apixaban) daily   had been changed to aspirin at time of stroke in May d/t size of infarct and bleeding risk. Restarted after 2 weeks.   Continue eliquis 5mg  bid for stroke prevention  Posterior vascular stenosis  MRA and CTA showed right VA, BA, b/l PCA severe stenosis. Left VA ends up at PICA  Pt new stroke at midbrain and left thalamus likely due to  posterior stenosis with orthostatic hypotension  Current orthostatic vital unremarkable  Recommend addition of aspirin 81 to eliquis for stroke prevention   Hypertension  Stable  Permissive hypertension (OK if < 220/120) but gradually normalize in 5-7 days  Long-term BP goal 130-150 due to intracranial stenosis  Avoid hypotension and keep hydration  Pt needs to check BP at home  Hyperlipidemia  Home meds:  crestor 10, resumed in hospital  LDL 85 in May, goal < 70  Continue statin at discharge  Diabetes type II  HgbA1c 6.9 in May, at goal < 7.0  Under control  SSI  Other Stroke Risk Factors  Advanced age  Former Cigarette smoker  ETOH use, advised to drink no more than 2 drink(s) a day  Hx stroke/TIA  01/05/2017 - Stroke due to embolism of PICA d/t AF  12/13/16 - b/l occipital acute on chronic infarcts.   Other Active Problems  Acute  encephalopathy  Aspiration  UTI   Hospital day # 1  Neurology will sign off. Please call with questions. Pt will follow up with Dr. Leonie Man at [facility] in about 6 weeks. Thanks for the consult.  Rosalin Hawking, MD PhD Stroke Neurology 01/19/2017 5:23 PM    To contact Stroke Continuity provider, please refer to [email or web address] After hours, contact General Neurology

## 2017-01-19 NOTE — Evaluation (Signed)
Speech Language Pathology Evaluation Patient Details Name: Costas Sena MRN: [id number] DOB: 1926-06-29 Today's Date: 01/19/2017 Time: 2025-4270 SLP Time Calculation (min) (ACUTE ONLY): 8 min  Problem List:  Patient Active Problem List   Diagnosis Date Noted  . Acute encephalopathy 01/18/2017  . Stroke due to embolism of right posterior cerebral artery ([place]) 01/09/2017  . Ataxia   . Benign essential HTN   . Esophageal stricture   . PAF (paroxysmal atrial fibrillation) ([place])   . Recurrent left pleural effusion   . Dysphagia, post-stroke   . Type 2 diabetes mellitus with circulatory disorder ([place])   . Acute blood loss anemia   . Mesothelioma of left lung ([place])   . Palliative care by specialist   . Stroke due to embolism of posterior inferior cerebral artery ([place]) d/t AF 01/05/2017  . Goals of care, counseling/discussion 12/23/2016  . Encounter for antineoplastic chemotherapy 12/23/2016  . Vertigo following cerebrovascular accident 12/16/2016  . Hyperkalemia 12/16/2016  . Type 2 diabetes mellitus with hyperlipidemia ([place]) 12/16/2016  . Insomnia 12/07/2016  . Atelectasis 10/30/2016  . Alcohol abuse 08/04/2016  . Permanent atrial fibrillation ([place]) 07/21/2016  . Malignant mesothelioma of pleura ([place]) 02/16/2016  . Perforation of right tympanic membrane 11/24/2013  . Carotid stenosis 02/21/2013  . TIA (transient ischemic attack) 02/17/2013  . Hypokalemia 11/26/2012  . GERD (gastroesophageal reflux disease) 05/17/2011  . Dysphagia 05/17/2011  . Stricture and stenosis of esophagus 08/29/2010  . Allergic rhinitis 01/16/2008  . 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 PROSTATE CANCER, HX OF 03/26/2007  . Hyperlipidemia 03/20/2007  . Essential hypertension 03/20/2007   Past Medical History:  Past Medical History:  Diagnosis Date  . Adenomatous colon polyp 2009  . Allergy   . Carotid artery occlusion   . Diverticulosis of colon (without mention of hemorrhage)   . ED  (erectile dysfunction)   . Esophageal stricture   . Esophagitis   . GERD (gastroesophageal reflux disease)   . Goals of care, counseling/discussion 12/23/2016  . Hiatal hernia   . Hypertension   . Prostate cancer ([place])   . Unspecified gastritis and gastroduodenitis without mention of hemorrhage    Past Surgical History:  Past Surgical History:  Procedure Laterality Date  . ENDARTERECTOMY Left 03/05/2013   Procedure: ENDARTERECTOMY CAROTID;  Surgeon: Conrad Satsuma, MD;  Location: [facility];  Service: Vascular;  Laterality: Left;  . ESOPHAGOGASTRODUODENOSCOPY (EGD) WITH ESOPHAGEAL DILATION    . PROSTATE SURGERY     Laser surgery   HPI:  Pt is an 84 y.o.maleadmitted from CIR due to AMS and worsening dysarthria. MRI showed a new 12 mm acute ischemic infarct at the lateral R midbrain/R corticospinal tract with punctate 3 mm R thalamic infarct. Pt had been non CIR for prior R cerebellar and brainstem infarcts. MBS completed on 5/26 recommended Dys 3 diet and nectar thick liquids due to sensed aspiration of thin liquids. He was later advanced to thin liquids clinically. PMH includes: gastritis, prostate cancer, HTN, HH, esophagitis, esophageal stricture, ED, malignant mesothelioma   Assessment / Plan / Recommendation Clinical Impression  Pt's speech is mildly slurred but intelligible at the conversational level. He does however have increased confusion and cognitive difficulties as compared to this SLP's prior assessments and reported status during time on CIR. He is oriented to person and place only. Pt stated that he had been living at home without any residuals from prior CVA, demonstrating no intellectual awareness, situational orientation, or recall of being on CIR prior to transfer. He  asked repetitive questions throughout assessment. He would benefit from SLP f/u to maximize cognitive function.    SLP Assessment  SLP Recommendation/Assessment: Patient needs continued Speech Lanaguage Pathology  Services SLP Visit Diagnosis: Cognitive communication deficit (R41.841)    Follow Up Recommendations  Inpatient Rehab    Frequency and Duration min 2x/week  2 weeks      SLP Evaluation Cognition  Overall Cognitive Status: Impaired/Different from baseline Arousal/Alertness: Awake/alert Orientation Level: Oriented to person;Oriented to place;Disoriented to time;Disoriented to situation Attention: Sustained Sustained Attention: Impaired Sustained Attention Impairment: Verbal basic;Functional basic Memory: Impaired Memory Impairment: Storage deficit;Retrieval deficit;Decreased recall of new information Awareness: Impaired Awareness Impairment: Intellectual impairment;Emergent impairment Problem Solving: Impaired Problem Solving Impairment: Verbal basic;Functional basic Safety/Judgment: Impaired       Comprehension  Auditory Comprehension Overall Auditory Comprehension: Appears within functional limits for tasks assessed    Expression Expression Primary Mode of Expression: Verbal Verbal Expression Overall Verbal Expression: Appears within functional limits for tasks assessed   Oral / Motor  Motor Speech Overall Motor Speech: Impaired Respiration: Within functional limits Phonation: Normal Resonance: Within functional limits Articulation: Impaired Level of Impairment: Conversation Intelligibility: Intelligible Motor Planning: Witnin functional limits Motor Speech Errors: Not applicable   GO                    Germain Osgood 01/19/2017, 11:05 AM  Germain Osgood, M.A. CCC-SLP [phone]

## 2017-01-19 NOTE — Progress Notes (Signed)
PROGRESS NOTE    Thomas [name]  IAX:[id number]  DOB: [birth year]  DOA: 01/18/2017 PCP: Marin Olp, MD   Brief Admission Hx: Thomas [name] is a 83 y.o. male  past history significant for esophageal stricture, hypertension, prostate cancer and stroke presents as a direct admission from inpatient rehabilitation for responsiveness. Patient had been an inpatient rehabilitation since 5/29. Patient was actively being treated for UTI with oral Bactrim. In the early afternoon patient was found to be unresponsive. With some stimulation he responded but not appropriately. There was concern for possible stroke the patient was sent to CT. CT scan of the head was negative for new acute stroke. Cardiology was consult by the rehabilitation attending. Decision was made that patient directly admitted back to the acute care hospital setting for evaluation of his unresponsiveness and what appeared to be atrial flutter on EKG.  MDM/Assessment & Plan: Acute CVA with encephalopathy Checking chest x-ray, urinalysis, blood culture CT head negative for new acute stroke [facility] Neurology consult appreciated, stroke team following.  Added aspirin 81 mg daily to plavix.  Dysarthria/Dysphagia Speech-language pathology consult Dys 3 diet  Aspiration Precautions  Hypertension When necessary hydralazine 10 mg IV as needed for severe blood pressure  Atrial fibrillation When necessary Lopressor for heart rate over 110  Diabetes SS Insulin before meals & at bedtime  CBG (last 3)   Recent Labs  01/19/17 0646 01/19/17 0735 01/19/17 1126  GLUCAP 93 99 143*   Hyperlipidemia Continue statin  UTI Cont bactrim repeat UA pending   Code Status: DNR except med  DVT Prophylaxis: SCD Family Communication: dgtrs at bedside Disposition Plan: Pending Improvement  Consultants:  Neuro  cardiology  Subjective: Pt much more alert and talking today.    Objective: Vitals:   01/18/17 2026  01/19/17 0059 01/19/17 0404 01/19/17 0940  BP: 139/77 (!) 146/79 131/77 (!) 150/86  Pulse: 70 76 71 74  Resp: 17 18 18 18   Temp: 97.8 F (36.6 C) 97.5 F (36.4 C) 98 F (36.7 C) 98.4 F (36.9 C)  TempSrc: Oral Oral Oral Oral  SpO2: 100% 100% 96% 99%    Intake/Output Summary (Last 24 hours) at 01/19/17 1217 Last data filed at 01/19/17 0900  Gross per 24 hour  Intake           633.33 ml  Output              350 ml  Net           283.33 ml   There were no vitals filed for this visit.   REVIEW OF SYSTEMS  As per history otherwise all reviewed and reported negative  Exam: MBE:MLJQG speech.  No  acute distress.   Cardiac: Irregular RR, no murmurs, rubs, or gallops.  Respiratory:Clear. No increased WOB. BE:EFEO, nontender, non-distended, normal bowel sounds  MS:  No edema; No deformity. No cyanosis.  Neuro: awake, alert.  Oriented x 2.  Psych: Oriented and appropriate   Data Reviewed: Basic Metabolic Panel:  Recent Labs Lab 01/16/17 0459 01/18/17 1614 01/19/17 0431  NA 136 136 136  K 3.6 3.6 3.6  CL 99* 102 103  CO2 27 24 20*  GLUCOSE 127* 120* 103*  BUN 16 22* 19  CREATININE 1.30* 1.51* 1.31*  CALCIUM 9.0 8.9 8.9  MG  --  2.0  --   PHOS  --  4.3  --    Liver Function Tests:  Recent Labs Lab 01/18/17 1614  AST 18  ALT 21  ALKPHOS 69  BILITOT 0.6  PROT 6.8  ALBUMIN 2.9*   No results for input(s): LIPASE, AMYLASE in the last 168 hours. No results for input(s): AMMONIA in the last 168 hours. CBC:  Recent Labs Lab 01/15/17 0907 01/16/17 0459 01/18/17 1614 01/19/17 0431  WBC 6.1 4.9 5.0 4.9  NEUTROABS 3.9  --  3.6  --   HGB 13.4 12.0* 11.5* 12.2*  HCT 42.7 37.7* 35.6* 38.9*  MCV 89.5 87.1 87.9 87.6  PLT 334 285 295 285   Cardiac Enzymes: No results for input(s): CKTOTAL, CKMB, CKMBINDEX, TROPONINI in the last 168 hours. CBG (last 3)   Recent Labs  01/19/17 0646 01/19/17 0735 01/19/17 1126  GLUCAP 93 99 143*   Recent Results  (from the past 240 hour(s))  Urine culture     Status: Abnormal   Collection Time: 01/15/17 12:40 PM  Result Value Ref Range Status   Specimen Description URINE, RANDOM  Final   Special Requests NONE  Final   Culture >=100,000 COLONIES/mL ESCHERICHIA COLI (A)  Final   Report Status 01/17/2017 FINAL  Final   Organism ID, Bacteria ESCHERICHIA COLI (A)  Final      Susceptibility   Escherichia coli - MIC*    AMPICILLIN >=32 RESISTANT Resistant     CEFAZOLIN >=64 RESISTANT Resistant     CEFTRIAXONE <=1 SENSITIVE Sensitive     CIPROFLOXACIN <=0.25 SENSITIVE Sensitive     GENTAMICIN <=1 SENSITIVE Sensitive     IMIPENEM <=0.25 SENSITIVE Sensitive     NITROFURANTOIN <=16 SENSITIVE Sensitive     TRIMETH/SULFA <=20 SENSITIVE Sensitive     AMPICILLIN/SULBACTAM 16 INTERMEDIATE Intermediate     PIP/TAZO <=4 SENSITIVE Sensitive     Extended ESBL NEGATIVE Sensitive     * >=100,000 COLONIES/mL ESCHERICHIA COLI  Culture, blood (Routine X 2) w Reflex to ID Panel     Status: None (Preliminary result)   Collection Time: 01/18/17  7:17 PM  Result Value Ref Range Status   Specimen Description BLOOD RIGHT ANTECUBITAL  Final   Special Requests   Final    BOTTLES DRAWN AEROBIC AND ANAEROBIC Blood Culture results may not be optimal due to an excessive volume of blood received in culture bottles   Culture NO GROWTH < 12 HOURS  Final   Report Status PENDING  Incomplete  Culture, blood (Routine X 2) w Reflex to ID Panel     Status: None (Preliminary result)   Collection Time: 01/18/17  7:18 PM  Result Value Ref Range Status   Specimen Description BLOOD RIGHT ARM  Final   Special Requests   Final    BOTTLES DRAWN AEROBIC AND ANAEROBIC Blood Culture results may not be optimal due to an excessive volume of blood received in culture bottles   Culture NO GROWTH < 12 HOURS  Final   Report Status PENDING  Incomplete     Studies: Ct Head Wo Contrast  Result Date: 01/18/2017 CLINICAL DATA:  Pt got lethargic and  confused after therapy today. Hx of stroke. When breathing pt is moving his head. Unable to hold head still. EXAM: CT HEAD WITHOUT CONTRAST TECHNIQUE: Contiguous axial images were obtained from the base of the skull through the vertex without intravenous contrast. COMPARISON:  MRI 01/06/2017 and earlier studies FINDINGS: Brain: There is central and cortical atrophy. Periventricular white matter changes are consistent with small vessel disease. Subacute right cerebellar infarct is again noted. Right pontine infarct is better seen on recent MRI exam. Remote bilateral occipital lobe infarcts  appear stable. No interval changes to indicate presence of acute infarct. There is no intra or extra-axial fluid collection or mass. Vascular: There is atherosclerotic calcification of the carotid siphons. Skull: Normal. Negative for fracture or focal lesion. Sinuses/Orbits: Bilateral mastoid effusions are present. Unremarkable appearance of the orbits and paranasal sinuses. Other: Study quality is degraded by patient motion artifact. IMPRESSION: 1. Subacute right cerebellar infarct, stable in appearance and not associated with hemorrhage. 2. Remote bilateral occipital infarcts. 3. Significant periventricular white matter changes and atrophy. 4.  No evidence for acute intracranial abnormality. 5. Bilateral mastoid effusions. Electronically Signed   By: Nolon Nations M.D.   On: 01/18/2017 12:35   Mr Brain Wo Contrast  Result Date: 01/19/2017 CLINICAL DATA:  Initial evaluation for acute stroke. EXAM: MRI HEAD WITHOUT CONTRAST TECHNIQUE: Multiplanar, multiecho pulse sequences of the brain and surrounding structures were obtained without intravenous contrast. COMPARISON:  Prior CT from earlier same day as well is recent MRI from 01/06/2017. FINDINGS: Brain: Diffuse prominence of the CSF containing spaces compatible with generalized cerebral atrophy. Patchy and confluent T2/FLAIR hyperintensity within the periventricular and deep  white matter both cerebral hemispheres most compatible chronic small vessel ischemic disease, moderate to severe in nature. There has been interval evolution of recently identified right cerebellar and pontine infarcts, now a late subacute in appearance. No significant mass effect. Few scattered punctate foci of petechial hemorrhage again noted. Patchy involvement of the right middle lung noted. There is a new 12 mm focus of restricted diffusion involving the lateral right midbrain at the level the right cortical spinal tract (series 4, image 21). Additional punctate through on the my or focus of diffusion within the right thalamus (series 4, image 26). These are new from prior. No associated hemorrhage or mass effect. No other evidence for acute ischemia. Remote bilateral occipital lobe infarcts again noted. Smaller remote left frontal and parietal lobe infarcts noted as well. No evidence for acute intracranial hemorrhage. No mass lesion, midline shift or mass effect. No hydrocephalus. No extra-axial fluid collection. Vascular: Major intracranial vascular flow voids are stable in appearance with patchy flow void in the vertebrobasilar system, better evaluated on recent CTA. Skull and upper cervical spine: Craniocervical junction within normal limits. Upper cervical spine unremarkable. Bone marrow signal intensity normal. No scalp soft tissue abnormality. Sinuses/Orbits: Globes and orbital soft tissues within normal limits. Patient status post lens extraction bilaterally. Paranasal sinuses are clear. Small bilateral mastoid effusions, of doubtful significance. Inner ear structures grossly normal. IMPRESSION: 1. New 12 mm acute ischemic nonhemorrhagic infarct at the lateral right midbrain/right corticospinal tract, with punctate 3 mm right thalamic infarct. 2. Normal expected interval evolution of subacute right cerebellar and brainstem infarcts. 3. Otherwise stable appearance of the brain with remote bilateral  occipital lobe, left frontal, left parietal lobe infarcts. Moderate to severe chronic small vessel ischemic disease. Electronically Signed   By: Jeannine Boga M.D.   On: 01/19/2017 00:48   Dg Chest Port 1 View  Result Date: 01/19/2017 CLINICAL DATA:  Aspiration EXAM: PORTABLE CHEST 1 VIEW COMPARISON:  01/18/2017 FINDINGS: Cardiac shadow is stable. Opacification of left hemithorax is again noted and stable. The right lung is well aerated without focal infiltrate or sizable effusion. No acute bony abnormality is seen. IMPRESSION: Opacification of left hemithorax stable from the prior study. Electronically Signed   By: Inez Catalina M.D.   On: 01/19/2017 07:18   Portable Chest 1 View  Result Date: 01/18/2017 CLINICAL DATA:  Acute encephalopathy. Hx: mesothelioma;  no chest complaints EXAM: PORTABLE CHEST 1 VIEW COMPARISON:  01/06/2017 and 11/14/1998 FINDINGS: Stable near complete opacification of the left hemithorax with only a minimally aerated left upper lobe. The right lung is clear. Contrast is identified within the transverse colon, presumably following barium swallow on 01/06/2017. IMPRESSION: Stable appearance of the left hemithorax. No evidence for acute abnormality of the right lung. Retained contrast following previous barium study 12 days ago. Consider KUB for further evaluation. Electronically Signed   By: Nolon Nations M.D.   On: 01/18/2017 19:45   Dg Swallowing Func-speech Pathology  Result Date: 01/19/2017 Objective Swallowing Evaluation: Type of Study: MBS-Modified Barium Swallow Study Patient Details Name: Ely Ballen MRN: [id number] Date of Birth: Nov 07, 1925 Today's Date: 01/19/2017 Time: SLP Start Time (ACUTE ONLY): 1019-SLP Stop Time (ACUTE ONLY): 1034 SLP Time Calculation (min) (ACUTE ONLY): 15 min Past Medical History: Past Medical History: Diagnosis Date . Adenomatous colon polyp 2009 . Allergy  . Carotid artery occlusion  . Diverticulosis of colon (without mention of hemorrhage)   . ED (erectile dysfunction)  . Esophageal stricture  . Esophagitis  . GERD (gastroesophageal reflux disease)  . Goals of care, counseling/discussion 12/23/2016 . Hiatal hernia  . Hypertension  . Prostate cancer ([place])  . Unspecified gastritis and gastroduodenitis without mention of hemorrhage  Past Surgical History: Past Surgical History: Procedure Laterality Date . ENDARTERECTOMY Left 03/05/2013  Procedure: ENDARTERECTOMY CAROTID;  Surgeon: Conrad Goulds, MD;  Location: [facility];  Service: Vascular;  Laterality: Left; . ESOPHAGOGASTRODUODENOSCOPY (EGD) WITH ESOPHAGEAL DILATION   . PROSTATE SURGERY    Laser surgery HPI: Pt is an 83 y.o.maleadmitted from CIR due to AMS and worsening dysarthria. MRI showed a new 12 mm acute ischemic infarct at the lateral R midbrain/R corticospinal tract with punctate 3 mm R thalamic infarct. Pt had been non CIR for prior R cerebellar and brainstem infarcts. MBS completed on 5/26 recommended Dys 3 diet and nectar thick liquids due to sensed aspiration of thin liquids. He was later advanced to thin liquids clinically. PMH includes: gastritis, prostate cancer, HTN, HH, esophagitis, esophageal stricture, ED, malignant mesothelioma Subjective: pt alert, confused Assessment / Plan / Recommendation CHL IP CLINICAL IMPRESSIONS 01/19/2017 Clinical Impression Pt has a moderate oropharyngeal dysphagia that appears to be multifactorial in nature. His oral phase is grossly similar to prior MBS on 5/26, with lingual pumping and slower transit. He has a delay in pharyngeal trigger that results in aspiration before the swallow with thin liquids and larger volumes of nectar thick liquids, but airway compromise occurs silently now, without any spontaneous attempts to clear. Cued coughing was not effective at clearing aspirates, but various other strategies implemented by SLP were, including: bolus manipulation to tsp size trials of nectar thick liquids, chin tuck when using nectar thick liquids, or  thickeneing bolus to cup sips of honey thick liquids with a head neutral position. He had increased pharyngeal residuals, primarily at the vallecula, most obvious with liquids as compared to solids, suggestive of a possible esophageal etiology given known esophageal history. For now, recommend Dys 3 diet and nectar thick liquids with use of a chin tuck. Min cues were needed for use of a complete chin tuck during today's study, but should he have difficulty implementing this across meals in light of his cognitive difficulties, use of nectar thick liquids by tsp or honey thick liquids by cup could be considered.  SLP Visit Diagnosis Dysphagia, oropharyngeal phase (R13.12) Attention and concentration deficit following -- Frontal lobe and executive function  deficit following -- Impact on safety and function Mild aspiration risk;Moderate aspiration risk   CHL IP TREATMENT RECOMMENDATION 01/19/2017 Treatment Recommendations Therapy as outlined in treatment plan below   Prognosis 01/19/2017 Prognosis for Safe Diet Advancement Good Barriers to Reach Goals Cognitive deficits Barriers/Prognosis Comment -- CHL IP DIET RECOMMENDATION 01/19/2017 SLP Diet Recommendations Dysphagia 3 (Mech soft) solids;Nectar thick liquid Liquid Administration via Cup;No straw Medication Administration Whole meds with puree Compensations Minimize environmental distractions;Slow rate;Small sips/bites;Follow solids with liquid;Chin tuck Postural Changes Seated upright at 90 degrees;Remain semi-upright after after feeds/meals (Comment)   CHL IP OTHER RECOMMENDATIONS 01/19/2017 Recommended Consults -- Oral Care Recommendations Oral care BID Other Recommendations Order thickener from pharmacy;Prohibited food (jello, ice cream, thin soups);Remove water pitcher   CHL IP FOLLOW UP RECOMMENDATIONS 01/19/2017 Follow up Recommendations Inpatient Rehab   CHL IP FREQUENCY AND DURATION 01/19/2017 Speech Therapy Frequency (ACUTE ONLY) min 2x/week Treatment Duration 2 weeks       CHL IP ORAL PHASE 01/19/2017 Oral Phase Impaired Oral - Pudding Teaspoon -- Oral - Pudding Cup -- Oral - Honey Teaspoon -- Oral - Honey Cup Weak lingual manipulation;Lingual pumping;Premature spillage Oral - Nectar Teaspoon -- Oral - Nectar Cup Weak lingual manipulation;Lingual pumping;Premature spillage Oral - Nectar Straw Lingual pumping;Weak lingual manipulation;Premature spillage Oral - Thin Teaspoon NT Oral - Thin Cup Weak lingual manipulation;Lingual pumping;Premature spillage Oral - Thin Straw -- Oral - Puree Weak lingual manipulation;Lingual pumping;Premature spillage Oral - Mech Soft -- Oral - Regular Weak lingual manipulation;Lingual pumping;Premature spillage Oral - Multi-Consistency -- Oral - Pill -- Oral Phase - Comment --  CHL IP PHARYNGEAL PHASE 01/19/2017 Pharyngeal Phase Impaired Pharyngeal- Pudding Teaspoon -- Pharyngeal -- Pharyngeal- Pudding Cup -- Pharyngeal -- Pharyngeal- Honey Teaspoon -- Pharyngeal -- Pharyngeal- Honey Cup Delayed swallow initiation-pyriform sinuses;Reduced anterior laryngeal mobility;Reduced laryngeal elevation;Pharyngeal residue - valleculae Pharyngeal -- Pharyngeal- Nectar Teaspoon -- Pharyngeal -- Pharyngeal- Nectar Cup Delayed swallow initiation-pyriform sinuses;Reduced anterior laryngeal mobility;Reduced laryngeal elevation;Pharyngeal residue - valleculae;Penetration/Aspiration before swallow;Compensatory strategies attempted (with notebox) Pharyngeal Material enters airway, passes BELOW cords without attempt by patient to eject out (silent aspiration) Pharyngeal- Nectar Straw Delayed swallow initiation-pyriform sinuses;Reduced anterior laryngeal mobility;Reduced laryngeal elevation;Pharyngeal residue - valleculae;Penetration/Aspiration before swallow Pharyngeal Material enters airway, remains ABOVE vocal cords and not ejected out Pharyngeal- Thin Teaspoon NT Pharyngeal -- Pharyngeal- Thin Cup Delayed swallow initiation-pyriform sinuses;Reduced anterior laryngeal  mobility;Reduced laryngeal elevation;Pharyngeal residue - valleculae;Compensatory strategies attempted (with notebox);Penetration/Aspiration before swallow Pharyngeal Material enters airway, passes BELOW cords without attempt by patient to eject out (silent aspiration) Pharyngeal- Thin Straw -- Pharyngeal -- Pharyngeal- Puree Reduced anterior laryngeal mobility;Reduced laryngeal elevation;Pharyngeal residue - valleculae;Delayed swallow initiation-vallecula Pharyngeal -- Pharyngeal- Mechanical Soft -- Pharyngeal -- Pharyngeal- Regular Reduced anterior laryngeal mobility;Reduced laryngeal elevation;Delayed swallow initiation-vallecula Pharyngeal -- Pharyngeal- Multi-consistency -- Pharyngeal -- Pharyngeal- Pill -- Pharyngeal -- Pharyngeal Comment --  CHL IP CERVICAL ESOPHAGEAL PHASE 01/19/2017 Cervical Esophageal Phase WFL Pudding Teaspoon -- Pudding Cup -- Honey Teaspoon -- Honey Cup -- Nectar Teaspoon -- Nectar Cup -- Nectar Straw -- Thin Teaspoon -- Thin Cup -- Thin Straw -- Puree -- Mechanical Soft -- Regular -- Multi-consistency -- Pill -- Cervical Esophageal Comment -- CHL IP GO 12/14/2016 Functional Assessment Tool Used clinical judgment Functional Limitations Memory Swallow Current Status (N5621) (None) Swallow Goal Status (H0865) (None) Swallow Discharge Status (H8469) (None) Motor Speech Current Status (G2952) (None) Motor Speech Goal Status (W4132) (None) Motor Speech Goal Status (G4010) (None) Spoken Language Comprehension Current Status (U7253) (None) Spoken Language Comprehension Goal Status (G6440) (None)  Spoken Language Comprehension Discharge Status [phone]) (None) Spoken Language Expression Current Status [phone]) (None) Spoken Language Expression Goal Status [phone]) (None) Spoken Language Expression Discharge Status [phone]) (None) Attention Current Status (O1771) (None) Attention Goal Status (H6579) (None) Attention Discharge Status [phone]) (None) Memory Current Status (X8329) CK Memory Goal Status  (V9166) CK Memory Discharge Status (G9170) CK Voice Current Status (G9171) (None) Voice Goal Status (M6004) (None) Voice Discharge Status (H9977) (None) Other Speech-Language Pathology Functional Limitation Current Status (S1423) (None) Other Speech-Language Pathology Functional Limitation Goal Status (T5320) (None) Other Speech-Language Pathology Functional Limitation Discharge Status [phone]) (None) Germain Osgood 01/19/2017, 11:31 AM  Germain Osgood, M.A. CCC-SLP [phone]             Scheduled Meds: .  stroke: mapping our early stages of recovery book   Does not apply Once  . insulin aspart  0-9 Units Subcutaneous TID WC  . rosuvastatin  10 mg Oral q1800  . sodium chloride flush  3 mL Intravenous Q12H   Continuous Infusions: . sodium chloride 100 mL/hr at 01/19/17 0030    Principal Problem:   Acute encephalopathy Active Problems:   Hyperlipidemia   Essential hypertension   GERD (gastroesophageal reflux disease)   Permanent atrial fibrillation (HCC)   Type 2 diabetes mellitus with hyperlipidemia (HCC)   Dysphagia, post-stroke   Stroke due to embolism of right posterior cerebral artery ([place])  Time spent:   Irwin Brakeman, MD, FAAFP [facility] Pager [phone]  If 7PM-7AM, please contact night-coverage [email or web address] Password [email or web address] 01/19/2017, 12:17 PM    LOS: 1 day

## 2017-01-19 NOTE — Evaluation (Signed)
Clinical/Bedside Swallow Evaluation Patient Details  Name: Thomas [name] MRN: [id number] Date of Birth: June 17, 1926  Today's Date: 01/19/2017 Time: SLP Start Time (ACUTE ONLY): 4098 SLP Stop Time (ACUTE ONLY): 0926 SLP Time Calculation (min) (ACUTE ONLY): 8 min  Past Medical History:  Past Medical History:  Diagnosis Date  . Adenomatous colon polyp 2009  . Allergy   . Carotid artery occlusion   . Diverticulosis of colon (without mention of hemorrhage)   . ED (erectile dysfunction)   . Esophageal stricture   . Esophagitis   . GERD (gastroesophageal reflux disease)   . Goals of care, counseling/discussion 12/23/2016  . Hiatal hernia   . Hypertension   . Prostate cancer ([place])   . Unspecified gastritis and gastroduodenitis without mention of hemorrhage    Past Surgical History:  Past Surgical History:  Procedure Laterality Date  . ENDARTERECTOMY Left 03/05/2013   Procedure: ENDARTERECTOMY CAROTID;  Surgeon: Conrad [place], MD;  Location: [place];  Service: Vascular;  Laterality: Left;  . ESOPHAGOGASTRODUODENOSCOPY (EGD) WITH ESOPHAGEAL DILATION    . PROSTATE SURGERY     Laser surgery   HPI:  Pt is an 83 y.o.maleadmitted from CIR due to AMS and worsening dysarthria. MRI showed a new 12 mm acute ischemic infarct at the lateral R midbrain/R corticospinal tract with punctate 3 mm R thalamic infarct. Pt had been non CIR for prior R cerebellar and brainstem infarcts. MBS completed on 5/26 recommended Dys 3 diet and nectar thick liquids due to sensed aspiration of thin liquids. He was later advanced to thin liquids clinically. PMH includes: gastritis, prostate cancer, HTN, HH, esophagitis, esophageal stricture, ED, malignant mesothelioma   Assessment / Plan / Recommendation Clinical Impression  Pt demonstrates signs of potential aspiration, concerning for changes in oropharyngeal function. His vocal quality is mildly wet at baseline and he has immediate coughing that follows cup sips of thin  and nectar thick liquids. Given MRI results, recommend to proceed with instrumental testing prior to PO initiation. SLP Visit Diagnosis: Dysphagia, oropharyngeal phase (R13.12)    Aspiration Risk  Moderate aspiration risk    Diet Recommendation NPO except meds   Medication Administration: Crushed with puree    Other  Recommendations Oral Care Recommendations: Oral care QID   Follow up Recommendations        Frequency and Duration            Prognosis Prognosis for Safe Diet Advancement: Good Barriers to Reach Goals: Cognitive deficits      Swallow Study   General HPI: Pt is an 83 y.o.maleadmitted from CIR due to AMS and worsening dysarthria. MRI showed a new 12 mm acute ischemic infarct at the lateral R midbrain/R corticospinal tract with punctate 3 mm R thalamic infarct. Pt had been non CIR for prior R cerebellar and brainstem infarcts. MBS completed on 5/26 recommended Dys 3 diet and nectar thick liquids due to sensed aspiration of thin liquids. He was later advanced to thin liquids clinically. PMH includes: gastritis, prostate cancer, HTN, HH, esophagitis, esophageal stricture, ED, malignant mesothelioma Type of Study: Bedside Swallow Evaluation Previous Swallow Assessment: see HPI Diet Prior to this Study: NPO Temperature Spikes Noted: No Respiratory Status: Room air History of Recent Intubation: No Behavior/Cognition: Alert;Cooperative;Pleasant mood;Confused Oral Care Completed by SLP: No Oral Cavity - Dentition: Missing dentition;Poor condition Vision: Functional for self-feeding Self-Feeding Abilities: Able to feed self Patient Positioning: Upright in bed Baseline Vocal Quality: Wet;Low vocal intensity Volitional Cough: Strong Volitional Swallow: Able to elicit  Oral/Motor/Sensory Function     Ice [employer] chips: Not tested   Thin Liquid Thin Liquid: Impaired Presentation: Cup;Self Fed Pharyngeal  Phase Impairments: Suspected delayed Swallow;Cough -  Immediate    Nectar Thick Nectar Thick Liquid: Impaired Presentation: Cup;Self Fed Pharyngeal Phase Impairments: Suspected delayed Swallow;Cough - Immediate   Honey Thick Honey Thick Liquid: Not tested   Puree Puree: Within functional limits Presentation: Self Fed;Spoon   Solid   GO   Solid: Not tested        Germain Osgood 01/19/2017,10:58 AM  Germain Osgood, M.A. CCC-SLP [phone]

## 2017-01-19 NOTE — Progress Notes (Signed)
Modified Barium Swallow Progress Note  Patient Details  Name: Thomas [name] MRN: [id number] Date of Birth: [birth year]  Today's Date: 01/19/2017  Modified Barium Swallow completed.  Full report located under Chart Review in the Imaging Section.  Brief recommendations include the following:  Clinical Impression  Pt has a moderate oropharyngeal dysphagia that appears to be multifactorial in nature. His oral phase is grossly similar to prior MBS on 5/26, with lingual pumping and slower transit. He has a delay in pharyngeal trigger that results in aspiration before the swallow with thin liquids and larger volumes of nectar thick liquids, but airway compromise occurs silently now, without any spontaneous attempts to clear.   Cued coughing was not effective at clearing aspirates, but various other strategies implemented by SLP were, including: bolus manipulation to tsp size trials of nectar thick liquids, chin tuck when using nectar thick liquids, or thickeneing bolus to cup sips of honey thick liquids with a head neutral position. He had increased pharyngeal residuals, primarily at the vallecula, most obvious with liquids as compared to solids, suggestive of a possible esophageal etiology given known esophageal history.   For now, recommend Dys 3 diet and nectar thick liquids with use of a chin tuck. Min cues were needed for use of a complete chin tuck during today's study, but should he have difficulty implementing this across meals in light of his cognitive difficulties, use of nectar thick liquids by tsp or honey thick liquids by cup could be considered.    Swallow Evaluation Recommendations       SLP Diet Recommendations: Dysphagia 3 ([employer] soft) solids;Nectar thick liquid   Liquid Administration via: Cup;No straw   Medication Administration: Whole meds with puree   Supervision: Patient able to self feed;Full supervision/cueing for compensatory strategies   Compensations: Minimize  environmental distractions;Slow rate;Small sips/bites;Follow solids with liquid;Chin tuck   Postural Changes: Seated upright at 90 degrees;Remain semi-upright after after feeds/meals (Comment)   Oral Care Recommendations: Oral care BID   Other Recommendations: Order thickener from pharmacy;Prohibited food (jello, ice cream, thin soups);Remove water pitcher    Germain Osgood 01/19/2017,11:31 AM   Germain Osgood, M.A. CCC-SLP [phone]

## 2017-01-19 NOTE — Progress Notes (Signed)
I have left a message for pt's daughter, Ivin Booty, to contact met to discuss pt's disposition now that he has returned to acute yesterday. I will follow up once we have had discussions with family. I have discussed with RN [place] and SW. [phone]

## 2017-01-19 NOTE — Progress Notes (Signed)
Progress Note  Patient Name: Thomas [name] Date of Encounter: 01/19/2017  Primary Cardiologist: Dr. Percival Spanish  Subjective   No reported events overnight.  He denies any pain or SOB.  Alert and responsive this AM.    Inpatient Medications    Scheduled Meds: . insulin aspart  0-9 Units Subcutaneous TID WC  . rosuvastatin  10 mg Oral q1800  . sodium chloride flush  3 mL Intravenous Q12H   Continuous Infusions: . sodium chloride 100 mL/hr at 01/19/17 0030   PRN Meds: acetaminophen **OR** acetaminophen, hydrALAZINE, metoprolol tartrate, ondansetron **OR** ondansetron (ZOFRAN) IV   Vital Signs    Vitals:   01/18/17 1714 01/18/17 2026 01/19/17 0059 01/19/17 0404  BP: 127/76 139/77 (!) 146/79 131/77  Pulse: 72 70 76 71  Resp: 19 17 18 18   Temp: 98.3 F (36.8 C) 97.8 F (36.6 C) 97.5 F (36.4 C) 98 F (36.7 C)  TempSrc: Oral Oral Oral Oral  SpO2: 97% 100% 100% 96%    Intake/Output Summary (Last 24 hours) at 01/19/17 0830 Last data filed at 01/19/17 0650  Gross per 24 hour  Intake           633.33 ml  Output                0 ml  Net           633.33 ml   There were no vitals filed for this visit.  Telemetry    Atrial fib.  Rate controlled.  No sustained pauses or bradycardias - Personally Reviewed  ECG    NA  Physical Exam   GEN: No  acute distress.   Neck: No  JVD Cardiac:  Irregular RR, no murmurs, rubs, or gallops.  Respiratory: Clear   to auscultation bilaterally. GI: Soft, nontender, non-distended, normal bowel sounds  MS:  No edema; No deformity. Neuro:   Nonfocal  Psych: Oriented and appropriate      Labs    Chemistry  Recent Labs Lab 01/16/17 0459 01/18/17 1614 01/19/17 0431  NA 136 136 136  K 3.6 3.6 3.6  CL 99* 102 103  CO2 27 24 20*  GLUCOSE 127* 120* 103*  BUN 16 22* 19  CREATININE 1.30* 1.51* 1.31*  CALCIUM 9.0 8.9 8.9  PROT  --  6.8  --   ALBUMIN  --  2.9*  --   AST  --  18  --   ALT  --  21  --   ALKPHOS  --  69  --     BILITOT  --  0.6  --   GFRNONAA 47* 39* 47*  GFRAA 54* 45* 54*  ANIONGAP 10 10 13      Hematology  Recent Labs Lab 01/16/17 0459 01/18/17 1614 01/19/17 0431  WBC 4.9 5.0 4.9  RBC 4.33 4.05* 4.44  HGB 12.0* 11.5* 12.2*  HCT 37.7* 35.6* 38.9*  MCV 87.1 87.9 87.6  MCH 27.7 28.4 27.5  MCHC 31.8 32.3 31.4  RDW 16.1* 16.7* 16.5*  PLT 285 295 285    Cardiac EnzymesNo results for input(s): TROPONINI in the last 168 hours. No results for input(s): TROPIPOC in the last 168 hours.   BNPNo results for input(s): BNP, PROBNP in the last 168 hours.   DDimer No results for input(s): DDIMER in the last 168 hours.   Radiology    Ct Head Wo Contrast  Result Date: 01/18/2017 CLINICAL DATA:  Pt got lethargic and confused after therapy today. Hx of stroke. When breathing  pt is moving his head. Unable to hold head still. EXAM: CT HEAD WITHOUT CONTRAST TECHNIQUE: Contiguous axial images were obtained from the base of the skull through the vertex without intravenous contrast. COMPARISON:  MRI 01/06/2017 and earlier studies FINDINGS: Brain: There is central and cortical atrophy. Periventricular white matter changes are consistent with small vessel disease. Subacute right cerebellar infarct is again noted. Right pontine infarct is better seen on recent MRI exam. Remote bilateral occipital lobe infarcts appear stable. No interval changes to indicate presence of acute infarct. There is no intra or extra-axial fluid collection or mass. Vascular: There is atherosclerotic calcification of the carotid siphons. Skull: Normal. Negative for fracture or focal lesion. Sinuses/Orbits: Bilateral mastoid effusions are present. Unremarkable appearance of the orbits and paranasal sinuses. Other: Study quality is degraded by patient motion artifact. IMPRESSION: 1. Subacute right cerebellar infarct, stable in appearance and not associated with hemorrhage. 2. Remote bilateral occipital infarcts. 3. Significant periventricular  white matter changes and atrophy. 4.  No evidence for acute intracranial abnormality. 5. Bilateral mastoid effusions. Electronically Signed   By: Nolon Nations M.D.   On: 01/18/2017 12:35   Mr Brain Wo Contrast  Result Date: 01/19/2017 CLINICAL DATA:  Initial evaluation for acute stroke. EXAM: MRI HEAD WITHOUT CONTRAST TECHNIQUE: Multiplanar, multiecho pulse sequences of the brain and surrounding structures were obtained without intravenous contrast. COMPARISON:  Prior CT from earlier same day as well is recent MRI from 01/06/2017. FINDINGS: Brain: Diffuse prominence of the CSF containing spaces compatible with generalized cerebral atrophy. Patchy and confluent T2/FLAIR hyperintensity within the periventricular and deep white matter both cerebral hemispheres most compatible chronic small vessel ischemic disease, moderate to severe in nature. There has been interval evolution of recently identified right cerebellar and pontine infarcts, now a late subacute in appearance. No significant mass effect. Few scattered punctate foci of petechial hemorrhage again noted. Patchy involvement of the right middle lung noted. There is a new 12 mm focus of restricted diffusion involving the lateral right midbrain at the level the right cortical spinal tract (series 4, image 21). Additional punctate through on the my or focus of diffusion within the right thalamus (series 4, image 26). These are new from prior. No associated hemorrhage or mass effect. No other evidence for acute ischemia. Remote bilateral occipital lobe infarcts again noted. Smaller remote left frontal and parietal lobe infarcts noted as well. No evidence for acute intracranial hemorrhage. No mass lesion, midline shift or mass effect. No hydrocephalus. No extra-axial fluid collection. Vascular: Major intracranial vascular flow voids are stable in appearance with patchy flow void in the vertebrobasilar system, better evaluated on recent CTA. Skull and upper  cervical spine: Craniocervical junction within normal limits. Upper cervical spine unremarkable. Bone marrow signal intensity normal. No scalp soft tissue abnormality. Sinuses/Orbits: Globes and orbital soft tissues within normal limits. Patient status post lens extraction bilaterally. Paranasal sinuses are clear. Small bilateral mastoid effusions, of doubtful significance. Inner ear structures grossly normal. IMPRESSION: 1. New 12 mm acute ischemic nonhemorrhagic infarct at the lateral right midbrain/right corticospinal tract, with punctate 3 mm right thalamic infarct. 2. Normal expected interval evolution of subacute right cerebellar and brainstem infarcts. 3. Otherwise stable appearance of the brain with remote bilateral occipital lobe, left frontal, left parietal lobe infarcts. Moderate to severe chronic small vessel ischemic disease. Electronically Signed   By: Jeannine Boga M.D.   On: 01/19/2017 00:48   Dg Chest Port 1 View  Result Date: 01/19/2017 CLINICAL DATA:  Aspiration EXAM:  PORTABLE CHEST 1 VIEW COMPARISON:  01/18/2017 FINDINGS: Cardiac shadow is stable. Opacification of left hemithorax is again noted and stable. The right lung is well aerated without focal infiltrate or sizable effusion. No acute bony abnormality is seen. IMPRESSION: Opacification of left hemithorax stable from the prior study. Electronically Signed   By: Inez Catalina M.D.   On: 01/19/2017 07:18   Portable Chest 1 View  Result Date: 01/18/2017 CLINICAL DATA:  Acute encephalopathy. Hx: mesothelioma; no chest complaints EXAM: PORTABLE CHEST 1 VIEW COMPARISON:  01/06/2017 and 11/14/1998 FINDINGS: Stable near complete opacification of the left hemithorax with only a minimally aerated left upper lobe. The right lung is clear. Contrast is identified within the transverse colon, presumably following barium swallow on 01/06/2017. IMPRESSION: Stable appearance of the left hemithorax. No evidence for acute abnormality of the right  lung. Retained contrast following previous barium study 12 days ago. Consider KUB for further evaluation. Electronically Signed   By: Nolon Nations M.D.   On: 01/18/2017 19:45    Cardiac Studies   NA  Patient Profile   83 y.o. male with a history of atrial fibrillation. He was placed on Eliquis-CHA2DS2 VASc=3. Ane echo showed preserved LVF. Other pertinent PMH include a history of mesothelioma and chronic Lt pleur effusion. His life expectancy is less than 6 months. He was admitted 01/09/17 after a Rt brain stroke. He was taken off Eliquis and placed on ASA. He was seen yesterday after having AMS.  EKG with atrial fib which has been chronic.   Assessment & Plan    UNRESPONSIVENESS:   Head CT is not acute.  MRI with new nonhemorrhagic infarct at the lateral right midbrain/right corticospinal tract.  Tele with fib but no pauses.  I don't, at this point suspect that the unresponsive episodes are related to his rhythm.  Continue tele.   Restart anticoagulation when OK with neurology.    ATRIAL FIB :  As above.    Signed, Minus Breeding, MD  01/19/2017, 8:30 AM

## 2017-01-19 NOTE — Progress Notes (Addendum)
I spoke with pt's daughters by phone, Ivin Booty and Freda Munro, to discuss potential dispo pending medical workup completion. Family to discuss over the weekend caregiver assistance at home and overall plans with Hospitalists and Neurology before dispo can be determined. I will follow up on Monday. [phone]

## 2017-01-20 ENCOUNTER — Encounter (HOSPITAL_COMMUNITY): Payer: Self-pay | Admitting: *Deleted

## 2017-01-20 DIAGNOSIS — E785 Hyperlipidemia, unspecified: Secondary | ICD-10-CM

## 2017-01-20 DIAGNOSIS — E1169 Type 2 diabetes mellitus with other specified complication: Secondary | ICD-10-CM

## 2017-01-20 LAB — GLUCOSE, CAPILLARY
GLUCOSE-CAPILLARY: 145 mg/dL — AB (ref 65–99)
GLUCOSE-CAPILLARY: 112 mg/dL — AB (ref 65–99)
Glucose-Capillary: 130 mg/dL — ABNORMAL HIGH (ref 65–99)
Glucose-Capillary: 124 mg/dL — ABNORMAL HIGH (ref 65–99)

## 2017-01-20 LAB — CBC
HCT: 42.5 % (ref 39.0–52.0)
Hemoglobin: 14.1 g/dL (ref 13.0–17.0)
MCH: 28.7 pg (ref 26.0–34.0)
MCHC: 33.2 g/dL (ref 30.0–36.0)
MCV: 86.4 fL (ref 78.0–100.0)
Platelets: 359 10*3/uL (ref 150–400)
RBC: 4.92 MIL/uL (ref 4.22–5.81)
RDW: 16 % — ABNORMAL HIGH (ref 11.5–15.5)
WBC: 7 10*3/uL (ref 4.0–10.5)

## 2017-01-20 LAB — BASIC METABOLIC PANEL
Anion gap: 13 (ref 5–15)
BUN: 15 mg/dL (ref 6–20)
CO2: 20 mmol/L — ABNORMAL LOW (ref 22–32)
Calcium: 9.3 mg/dL (ref 8.9–10.3)
Chloride: 101 mmol/L (ref 101–111)
Creatinine, Ser: 1.2 mg/dL (ref 0.61–1.24)
GFR, EST AFRICAN AMERICAN: 60 mL/min — AB (ref >60)
GFR, EST NON AFRICAN AMERICAN: 52 mL/min — AB (ref >60)
Glucose, Bld: 176 mg/dL — ABNORMAL HIGH (ref 65–99)
POTASSIUM: 3.2 mmol/L — AB (ref 3.5–5.1)
SODIUM: 134 mmol/L — AB (ref 135–145)

## 2017-01-20 MED ORDER — ALPRAZOLAM 0.25 MG PO TABS
0.2500 mg | ORAL_TABLET | Freq: Once | ORAL | Status: AC
  Administered 2017-01-20: 0.25 mg via ORAL

## 2017-01-20 MED ORDER — LORAZEPAM 2 MG/ML IJ SOLN
0.5000 mg | INTRAMUSCULAR | Status: DC | PRN
  Filled 2017-01-20: qty 1

## 2017-01-20 MED ORDER — SODIUM CHLORIDE 0.9 % IV SOLN: 250.0000 mL | INTRAVENOUS | Status: DC | PRN

## 2017-01-20 MED ORDER — HALOPERIDOL LACTATE 5 MG/ML IJ SOLN
1.0000 mg | Freq: Four times a day (QID) | INTRAMUSCULAR | Status: DC | PRN
  Administered 2017-01-20: 1 mg via INTRAMUSCULAR
  Filled 2017-01-20: qty 1

## 2017-01-20 MED ORDER — SODIUM CHLORIDE 0.9% FLUSH
3.0000 mL | Freq: Two times a day (BID) | INTRAVENOUS | Status: DC
  Administered 2017-01-22 – 2017-01-23 (×3): 3 mL via INTRAVENOUS

## 2017-01-20 MED ORDER — LORAZEPAM 2 MG/ML IJ SOLN
0.5000 mg | INTRAMUSCULAR | Status: DC | PRN
  Administered 2017-01-20: 0.5 mg via INTRAMUSCULAR
  Filled 2017-01-20: qty 1

## 2017-01-20 MED ORDER — HALOPERIDOL LACTATE 5 MG/ML IJ SOLN: 1.0000 mg | Freq: Four times a day (QID) | INTRAMUSCULAR | Status: DC | PRN

## 2017-01-20 MED ORDER — POTASSIUM CHLORIDE CRYS ER 20 MEQ PO TBCR
40.0000 meq | EXTENDED_RELEASE_TABLET | Freq: Once | ORAL | Status: AC
Start: 2017-01-20 — End: 2017-01-20
  Administered 2017-01-20: 40 meq via ORAL
  Filled 2017-01-20: qty 2

## 2017-01-20 MED ORDER — SODIUM CHLORIDE 0.9% FLUSH: 3.0000 mL | INTRAVENOUS | Status: DC | PRN

## 2017-01-20 NOTE — Progress Notes (Signed)
PROGRESS NOTE    Thomas [name]  VWU:[id number]  DOB: 02/07/1926  DOA: 01/18/2017 PCP: Marin Olp, MD   Brief Admission Hx: Thomas [name] is a 83 y.o. male  past history significant for esophageal stricture, hypertension, prostate cancer and stroke presents as a direct admission from inpatient rehabilitation for responsiveness. Patient had been an inpatient rehabilitation since 5/29. Patient was actively being treated for UTI with oral Bactrim. In the early afternoon patient was found to be unresponsive. With some stimulation he responded but not appropriately. There was concern for possible stroke the patient was sent to CT. CT scan of the head was negative for new acute stroke. Cardiology was consult by the rehabilitation attending. Decision was made that patient directly admitted back to the acute care hospital setting for evaluation of his unresponsiveness and what appeared to be atrial flutter on EKG.  MDM/Assessment & Plan: Acute CVA with encephalopathy New R midbrain and R thalamic infarcts felt to be due to severe VA, BA and PCA stenosis. Acute delirium - added lorazepam and haldol IM prn MRI New 12 mm acute ischemic nonhemorrhagic infarct at the lateral right midbrain/right corticospinal tract, with punctate 3 mm right thalamic infarct. TSH WNL Neurology consult appreciated, stroke team following.  Added aspirin 81 mg daily to plavix.  Dysarthria/Dysphagia Speech-language pathology consult recommending  Dys 3 diet nectar thick liquids Aspiration Precautions  Hypertension Allow permissive hypertension but gradually normalize in 5-7 days  Atrial fibrillation Eliquis 5 mg bid for stroke prevention When necessary Lopressor for heart rate over 110  Diabetes SS Insulin before meals & at bedtime  CBG (last 3)   Recent Labs  01/19/17 1627 01/19/17 2122 01/20/17 0602  GLUCAP 99 190* 130*   Hyperlipidemia Continue statin  UTI Cont bactrim repeat UA Within  normal limits, completed bactrim.   Code Status: DNR except med  DVT Prophylaxis: SCD Family Communication: wife/son at bedside Disposition Plan: CIR recommended  Consultants:  Neuro  cardiology  Subjective: Pt having confusion and delirium.    Objective: Vitals:   01/19/17 1758 01/19/17 2113 01/20/17 0000 01/20/17 0533  BP: (!) 155/96 (!) 157/96 (!) 177/100 (!) 168/99  Pulse: 93 81 82 77  Resp: 18 18 20 20   Temp: 98 F (36.7 C) 97.9 F (36.6 C) 98.3 F (36.8 C) 98.2 F (36.8 C)  TempSrc: Oral Oral Oral Oral  SpO2: 100% 100% 98% 99%    Intake/Output Summary (Last 24 hours) at 01/20/17 0834 Last data filed at 01/20/17 0536  Gross per 24 hour  Intake          2356.67 ml  Output              350 ml  Net          2006.67 ml   There were no vitals filed for this visit.   REVIEW OF SYSTEMS  As per history otherwise all reviewed and reported negative  Exam: GNF:AOZHY speech.  No  acute distress.  confused, delirious.  Cardiac: Irregular RR, no murmurs, rubs, or gallops.  Respiratory:Clear. No increased WOB. QM:VHQI, nontender, non-distended, normal bowel sounds  MS:  No edema; No deformity. No cyanosis.  Neuro: awake, alert.  Oriented x 2.  Psych: Oriented and appropriate   Data Reviewed: Basic Metabolic Panel:  Recent Labs Lab 01/16/17 0459 01/18/17 1614 01/19/17 0431  NA 136 136 136  K 3.6 3.6 3.6  CL 99* 102 103  CO2 27 24 20*  GLUCOSE 127* 120* 103*  BUN 16  22* 19  CREATININE 1.30* 1.51* 1.31*  CALCIUM 9.0 8.9 8.9  MG  --  2.0  --   PHOS  --  4.3  --    Liver Function Tests:  Recent Labs Lab 01/18/17 1614  AST 18  ALT 21  ALKPHOS 69  BILITOT 0.6  PROT 6.8  ALBUMIN 2.9*   No results for input(s): LIPASE, AMYLASE in the last 168 hours. No results for input(s): AMMONIA in the last 168 hours. CBC:  Recent Labs Lab 01/15/17 0907 01/16/17 0459 01/18/17 1614 01/19/17 0431  WBC 6.1 4.9 5.0 4.9  NEUTROABS 3.9  --  3.6  --     HGB 13.4 12.0* 11.5* 12.2*  HCT 42.7 37.7* 35.6* 38.9*  MCV 89.5 87.1 87.9 87.6  PLT 334 285 295 285   Cardiac Enzymes: No results for input(s): CKTOTAL, CKMB, CKMBINDEX, TROPONINI in the last 168 hours. CBG (last 3)   Recent Labs  01/19/17 1627 01/19/17 2122 01/20/17 0602  GLUCAP 99 190* 130*   Recent Results (from the past 240 hour(s))  Urine culture     Status: Abnormal   Collection Time: 01/15/17 12:40 PM  Result Value Ref Range Status   Specimen Description URINE, RANDOM  Final   Special Requests NONE  Final   Culture >=100,000 COLONIES/mL ESCHERICHIA COLI (A)  Final   Report Status 01/17/2017 FINAL  Final   Organism ID, Bacteria ESCHERICHIA COLI (A)  Final      Susceptibility   Escherichia coli - MIC*    AMPICILLIN >=32 RESISTANT Resistant     CEFAZOLIN >=64 RESISTANT Resistant     CEFTRIAXONE <=1 SENSITIVE Sensitive     CIPROFLOXACIN <=0.25 SENSITIVE Sensitive     GENTAMICIN <=1 SENSITIVE Sensitive     IMIPENEM <=0.25 SENSITIVE Sensitive     NITROFURANTOIN <=16 SENSITIVE Sensitive     TRIMETH/SULFA <=20 SENSITIVE Sensitive     AMPICILLIN/SULBACTAM 16 INTERMEDIATE Intermediate     PIP/TAZO <=4 SENSITIVE Sensitive     Extended ESBL NEGATIVE Sensitive     * >=100,000 COLONIES/mL ESCHERICHIA COLI  Culture, blood (Routine X 2) w Reflex to ID Panel     Status: None (Preliminary result)   Collection Time: 01/18/17  7:17 PM  Result Value Ref Range Status   Specimen Description BLOOD RIGHT ANTECUBITAL  Final   Special Requests   Final    BOTTLES DRAWN AEROBIC AND ANAEROBIC Blood Culture results may not be optimal due to an excessive volume of blood received in culture bottles   Culture NO GROWTH < 12 HOURS  Final   Report Status PENDING  Incomplete  Culture, blood (Routine X 2) w Reflex to ID Panel     Status: None (Preliminary result)   Collection Time: 01/18/17  7:18 PM  Result Value Ref Range Status   Specimen Description BLOOD RIGHT ARM  Final   Special Requests    Final    BOTTLES DRAWN AEROBIC AND ANAEROBIC Blood Culture results may not be optimal due to an excessive volume of blood received in culture bottles   Culture NO GROWTH < 12 HOURS  Final   Report Status PENDING  Incomplete     Studies: Ct Head Wo Contrast  Result Date: 01/18/2017 CLINICAL DATA:  Pt got lethargic and confused after therapy today. Hx of stroke. When breathing pt is moving his head. Unable to hold head still. EXAM: CT HEAD WITHOUT CONTRAST TECHNIQUE: Contiguous axial images were obtained from the base of the skull through the vertex without  intravenous contrast. COMPARISON:  MRI 01/06/2017 and earlier studies FINDINGS: Brain: There is central and cortical atrophy. Periventricular white matter changes are consistent with small vessel disease. Subacute right cerebellar infarct is again noted. Right pontine infarct is better seen on recent MRI exam. Remote bilateral occipital lobe infarcts appear stable. No interval changes to indicate presence of acute infarct. There is no intra or extra-axial fluid collection or mass. Vascular: There is atherosclerotic calcification of the carotid siphons. Skull: Normal. Negative for fracture or focal lesion. Sinuses/Orbits: Bilateral mastoid effusions are present. Unremarkable appearance of the orbits and paranasal sinuses. Other: Study quality is degraded by patient motion artifact. IMPRESSION: 1. Subacute right cerebellar infarct, stable in appearance and not associated with hemorrhage. 2. Remote bilateral occipital infarcts. 3. Significant periventricular white matter changes and atrophy. 4.  No evidence for acute intracranial abnormality. 5. Bilateral mastoid effusions. Electronically Signed   By: Nolon Nations M.D.   On: 01/18/2017 12:35   Mr Brain Wo Contrast  Result Date: 01/19/2017 CLINICAL DATA:  Initial evaluation for acute stroke. EXAM: MRI HEAD WITHOUT CONTRAST TECHNIQUE: Multiplanar, multiecho pulse sequences of the brain and surrounding  structures were obtained without intravenous contrast. COMPARISON:  Prior CT from earlier same day as well is recent MRI from 01/06/2017. FINDINGS: Brain: Diffuse prominence of the CSF containing spaces compatible with generalized cerebral atrophy. Patchy and confluent T2/FLAIR hyperintensity within the periventricular and deep white matter both cerebral hemispheres most compatible chronic small vessel ischemic disease, moderate to severe in nature. There has been interval evolution of recently identified right cerebellar and pontine infarcts, now a late subacute in appearance. No significant mass effect. Few scattered punctate foci of petechial hemorrhage again noted. Patchy involvement of the right middle lung noted. There is a new 12 mm focus of restricted diffusion involving the lateral right midbrain at the level the right cortical spinal tract (series 4, image 21). Additional punctate through on the my or focus of diffusion within the right thalamus (series 4, image 26). These are new from prior. No associated hemorrhage or mass effect. No other evidence for acute ischemia. Remote bilateral occipital lobe infarcts again noted. Smaller remote left frontal and parietal lobe infarcts noted as well. No evidence for acute intracranial hemorrhage. No mass lesion, midline shift or mass effect. No hydrocephalus. No extra-axial fluid collection. Vascular: Major intracranial vascular flow voids are stable in appearance with patchy flow void in the vertebrobasilar system, better evaluated on recent CTA. Skull and upper cervical spine: Craniocervical junction within normal limits. Upper cervical spine unremarkable. Bone marrow signal intensity normal. No scalp soft tissue abnormality. Sinuses/Orbits: Globes and orbital soft tissues within normal limits. Patient status post lens extraction bilaterally. Paranasal sinuses are clear. Small bilateral mastoid effusions, of doubtful significance. Inner ear structures grossly  normal. IMPRESSION: 1. New 12 mm acute ischemic nonhemorrhagic infarct at the lateral right midbrain/right corticospinal tract, with punctate 3 mm right thalamic infarct. 2. Normal expected interval evolution of subacute right cerebellar and brainstem infarcts. 3. Otherwise stable appearance of the brain with remote bilateral occipital lobe, left frontal, left parietal lobe infarcts. Moderate to severe chronic small vessel ischemic disease. Electronically Signed   By: Jeannine Boga M.D.   On: 01/19/2017 00:48   Dg Chest Port 1 View  Result Date: 01/19/2017 CLINICAL DATA:  Aspiration EXAM: PORTABLE CHEST 1 VIEW COMPARISON:  01/18/2017 FINDINGS: Cardiac shadow is stable. Opacification of left hemithorax is again noted and stable. The right lung is well aerated without focal infiltrate or  sizable effusion. No acute bony abnormality is seen. IMPRESSION: Opacification of left hemithorax stable from the prior study. Electronically Signed   By: Inez Catalina M.D.   On: 01/19/2017 07:18   Portable Chest 1 View  Result Date: 01/18/2017 CLINICAL DATA:  Acute encephalopathy. Hx: mesothelioma; no chest complaints EXAM: PORTABLE CHEST 1 VIEW COMPARISON:  01/06/2017 and 11/14/1998 FINDINGS: Stable near complete opacification of the left hemithorax with only a minimally aerated left upper lobe. The right lung is clear. Contrast is identified within the transverse colon, presumably following barium swallow on 01/06/2017. IMPRESSION: Stable appearance of the left hemithorax. No evidence for acute abnormality of the right lung. Retained contrast following previous barium study 12 days ago. Consider KUB for further evaluation. Electronically Signed   By: Nolon Nations M.D.   On: 01/18/2017 19:45   Dg Swallowing Func-speech Pathology  Result Date: 01/19/2017 Objective Swallowing Evaluation: Type of Study: MBS-Modified Barium Swallow Study Patient Details Name: Talal Fritchman MRN: [id number] Date of Birth: Jul 08, 1926  Today's Date: 01/19/2017 Time: SLP Start Time (ACUTE ONLY): 1019-SLP Stop Time (ACUTE ONLY): 1034 SLP Time Calculation (min) (ACUTE ONLY): 15 min Past Medical History: Past Medical History: Diagnosis Date . Adenomatous colon polyp 2009 . Allergy  . Carotid artery occlusion  . Diverticulosis of colon (without mention of hemorrhage)  . ED (erectile dysfunction)  . Esophageal stricture  . Esophagitis  . GERD (gastroesophageal reflux disease)  . Goals of care, counseling/discussion 12/23/2016 . Hiatal hernia  . Hypertension  . Prostate cancer ([place])  . Unspecified gastritis and gastroduodenitis without mention of hemorrhage  Past Surgical History: Past Surgical History: Procedure Laterality Date . ENDARTERECTOMY Left 03/05/2013  Procedure: ENDARTERECTOMY CAROTID;  Surgeon: Conrad S[place], MD;  Location: [facility];  Service: Vascular;  Laterality: Left; . ESOPHAGOGASTRODUODENOSCOPY (EGD) WITH ESOPHAGEAL DILATION   . PROSTATE SURGERY    Laser surgery HPI: Pt is an 83 y.o.maleadmitted from CIR due to AMS and worsening dysarthria. MRI showed a new 12 mm acute ischemic infarct at the lateral R midbrain/R corticospinal tract with punctate 3 mm R thalamic infarct. Pt had been non CIR for prior R cerebellar and brainstem infarcts. MBS completed on 5/26 recommended Dys 3 diet and nectar thick liquids due to sensed aspiration of thin liquids. He was later advanced to thin liquids clinically. PMH includes: gastritis, prostate cancer, HTN, HH, esophagitis, esophageal stricture, ED, malignant mesothelioma Subjective: pt alert, confused Assessment / Plan / Recommendation CHL IP CLINICAL IMPRESSIONS 01/19/2017 Clinical Impression Pt has a moderate oropharyngeal dysphagia that appears to be multifactorial in nature. His oral phase is grossly similar to prior MBS on 5/26, with lingual pumping and slower transit. He has a delay in pharyngeal trigger that results in aspiration before the swallow with thin liquids and larger volumes of nectar thick  liquids, but airway compromise occurs silently now, without any spontaneous attempts to clear. Cued coughing was not effective at clearing aspirates, but various other strategies implemented by SLP were, including: bolus manipulation to tsp size trials of nectar thick liquids, chin tuck when using nectar thick liquids, or thickeneing bolus to cup sips of honey thick liquids with a head neutral position. He had increased pharyngeal residuals, primarily at the vallecula, most obvious with liquids as compared to solids, suggestive of a possible esophageal etiology given known esophageal history. For now, recommend Dys 3 diet and nectar thick liquids with use of a chin tuck. Min cues were needed for use of a complete chin tuck during today's study, but  should he have difficulty implementing this across meals in light of his cognitive difficulties, use of nectar thick liquids by tsp or honey thick liquids by cup could be considered.  SLP Visit Diagnosis Dysphagia, oropharyngeal phase (R13.12) Attention and concentration deficit following -- Frontal lobe and executive function deficit following -- Impact on safety and function Mild aspiration risk;Moderate aspiration risk   CHL IP TREATMENT RECOMMENDATION 01/19/2017 Treatment Recommendations Therapy as outlined in treatment plan below   Prognosis 01/19/2017 Prognosis for Safe Diet Advancement Good Barriers to Reach Goals Cognitive deficits Barriers/Prognosis Comment -- CHL IP DIET RECOMMENDATION 01/19/2017 SLP Diet Recommendations Dysphagia 3 (Mech soft) solids;Nectar thick liquid Liquid Administration via Cup;No straw Medication Administration Whole meds with puree Compensations Minimize environmental distractions;Slow rate;Small sips/bites;Follow solids with liquid;Chin tuck Postural Changes Seated upright at 90 degrees;Remain semi-upright after after feeds/meals (Comment)   CHL IP OTHER RECOMMENDATIONS 01/19/2017 Recommended Consults -- Oral Care Recommendations Oral care BID  Other Recommendations Order thickener from pharmacy;Prohibited food (jello, ice cream, thin soups);Remove water pitcher   CHL IP FOLLOW UP RECOMMENDATIONS 01/19/2017 Follow up Recommendations Inpatient Rehab   CHL IP FREQUENCY AND DURATION 01/19/2017 Speech Therapy Frequency (ACUTE ONLY) min 2x/week Treatment Duration 2 weeks      CHL IP ORAL PHASE 01/19/2017 Oral Phase Impaired Oral - Pudding Teaspoon -- Oral - Pudding Cup -- Oral - Honey Teaspoon -- Oral - Honey Cup Weak lingual manipulation;Lingual pumping;Premature spillage Oral - Nectar Teaspoon -- Oral - Nectar Cup Weak lingual manipulation;Lingual pumping;Premature spillage Oral - Nectar Straw Lingual pumping;Weak lingual manipulation;Premature spillage Oral - Thin Teaspoon NT Oral - Thin Cup Weak lingual manipulation;Lingual pumping;Premature spillage Oral - Thin Straw -- Oral - Puree Weak lingual manipulation;Lingual pumping;Premature spillage Oral - Mech Soft -- Oral - Regular Weak lingual manipulation;Lingual pumping;Premature spillage Oral - Multi-Consistency -- Oral - Pill -- Oral Phase - Comment --  CHL IP PHARYNGEAL PHASE 01/19/2017 Pharyngeal Phase Impaired Pharyngeal- Pudding Teaspoon -- Pharyngeal -- Pharyngeal- Pudding Cup -- Pharyngeal -- Pharyngeal- Honey Teaspoon -- Pharyngeal -- Pharyngeal- Honey Cup Delayed swallow initiation-pyriform sinuses;Reduced anterior laryngeal mobility;Reduced laryngeal elevation;Pharyngeal residue - valleculae Pharyngeal -- Pharyngeal- Nectar Teaspoon -- Pharyngeal -- Pharyngeal- Nectar Cup Delayed swallow initiation-pyriform sinuses;Reduced anterior laryngeal mobility;Reduced laryngeal elevation;Pharyngeal residue - valleculae;Penetration/Aspiration before swallow;Compensatory strategies attempted (with notebox) Pharyngeal Material enters airway, passes BELOW cords without attempt by patient to eject out (silent aspiration) Pharyngeal- Nectar Straw Delayed swallow initiation-pyriform sinuses;Reduced anterior laryngeal  mobility;Reduced laryngeal elevation;Pharyngeal residue - valleculae;Penetration/Aspiration before swallow Pharyngeal Material enters airway, remains ABOVE vocal cords and not ejected out Pharyngeal- Thin Teaspoon NT Pharyngeal -- Pharyngeal- Thin Cup Delayed swallow initiation-pyriform sinuses;Reduced anterior laryngeal mobility;Reduced laryngeal elevation;Pharyngeal residue - valleculae;Compensatory strategies attempted (with notebox);Penetration/Aspiration before swallow Pharyngeal Material enters airway, passes BELOW cords without attempt by patient to eject out (silent aspiration) Pharyngeal- Thin Straw -- Pharyngeal -- Pharyngeal- Puree Reduced anterior laryngeal mobility;Reduced laryngeal elevation;Pharyngeal residue - valleculae;Delayed swallow initiation-vallecula Pharyngeal -- Pharyngeal- Mechanical Soft -- Pharyngeal -- Pharyngeal- Regular Reduced anterior laryngeal mobility;Reduced laryngeal elevation;Delayed swallow initiation-vallecula Pharyngeal -- Pharyngeal- Multi-consistency -- Pharyngeal -- Pharyngeal- Pill -- Pharyngeal -- Pharyngeal Comment --  CHL IP CERVICAL ESOPHAGEAL PHASE 01/19/2017 Cervical Esophageal Phase WFL Pudding Teaspoon -- Pudding Cup -- Honey Teaspoon -- Honey Cup -- Nectar Teaspoon -- Nectar Cup -- Nectar Straw -- Thin Teaspoon -- Thin Cup -- Thin Straw -- Puree -- Mechanical Soft -- Regular -- Multi-consistency -- Pill -- Cervical Esophageal Comment -- CHL IP GO 12/14/2016 Functional Assessment Tool Used clinical judgment Functional  Limitations Memory Swallow Current Status [phone]) (None) Swallow Goal Status (C5852) (None) Swallow Discharge Status [phone]) (None) Motor Speech Current Status [phone]) (None) Motor Speech Goal Status [phone]) (None) Motor Speech Goal Status [phone]) (None) Spoken Language Comprehension Current Status [phone]) (None) Spoken Language Comprehension Goal Status (J0932) (None) Spoken Language Comprehension Discharge Status [phone]) (None) Spoken Language  Expression Current Status [phone]) (None) Spoken Language Expression Goal Status (I3382) (None) Spoken Language Expression Discharge Status [phone]) (None) Attention Current Status (J6734) (None) Attention Goal Status (L9379) (None) Attention Discharge Status [phone]) (None) Memory Current Status (B3532) CK Memory Goal Status (D9242) CK Memory Discharge Status (G9170) CK Voice Current Status (G9171) (None) Voice Goal Status (A8341) (None) Voice Discharge Status (D6222) (None) Other Speech-Language Pathology Functional Limitation Current Status (L7989) (None) Other Speech-Language Pathology Functional Limitation Goal Status (Q1194) (None) Other Speech-Language Pathology Functional Limitation Discharge Status [phone]) (None) Germain Osgood 01/19/2017, 11:31 AM  Germain Osgood, M.A. CCC-SLP [phone]             Scheduled Meds: . ALPRAZolam  0.25 mg Oral Once  . apixaban  5 mg Oral BID  . aspirin EC  81 mg Oral Daily  . insulin aspart  0-9 Units Subcutaneous TID WC  . rosuvastatin  10 mg Oral q1800  . sodium chloride flush  3 mL Intravenous Q12H   Continuous Infusions: . sodium chloride Stopped (01/20/17 0602)    Principal Problem:   Acute encephalopathy Active Problems:   Hyperlipidemia   Essential hypertension   GERD (gastroesophageal reflux disease)   Permanent atrial fibrillation (HCC)   Type 2 diabetes mellitus with hyperlipidemia (HCC)   Dysphagia, post-stroke   Cerebrovascular accident (CVA) due to stenosis of basilar artery ([place])  Time spent:   Irwin Brakeman, MD, FAAFP [facility] Pager [phone]  If 7PM-7AM, please contact night-coverage [email or web address] Password [email or web address] 01/20/2017, 8:34 AM    LOS: 2 days

## 2017-01-20 NOTE — Progress Notes (Signed)
  Speech Language Pathology Treatment: Dysphagia  Patient Details Name: Thomas [name] MRN: [id number] DOB: 1925/11/07 Today's Date: 01/20/2017 Time: 811-0175 SLP Time Calculation (min) (ACUTE ONLY): 15 min  Assessment / Plan / Recommendation Clinical Impression  Pt seen for dysphagia treatment; finishing lunch as SLP arrived. Daughter at bedside. SLP inquired re: use of chin tuck during meal, daughter was unaware of this recommendation and SLP directed her to swallowing precautions sign posted above [facility] for recommended strategies. Pt noted with wet vocal quality initially with sip of nectar thick liquid (did not use chin tuck). SLP reviewed video of pt's MBS with pt, family, explaining use of chin tuck with nectar thick liquids to prevent aspiration. With moderate verbal cues initially, pt implemented chin tuck accurately with sips of nectar thick liquid via cup and no overt signs of aspiration. SLP faded cues and pt noted to independently tuck chin with subsequent swallows. Daughter demonstrated understanding of swallowing precautions, verbally cuing pt to slow rate of intake. Spoke with RN, recommending pt receive intermittent supervision/reminders to use compensatory strategy as meal begins. SLP will f/u.    HPI HPI: Pt is an 83 y.o.maleadmitted from CIR due to AMS and worsening dysarthria. MRI showed a new 12 mm acute ischemic infarct at the lateral R midbrain/R corticospinal tract with punctate 3 mm R thalamic infarct. Pt had been non CIR for prior R cerebellar and brainstem infarcts. MBS completed on 5/26 recommended Dys 3 diet and nectar thick liquids due to sensed aspiration of thin liquids. He was later advanced to thin liquids clinically. PMH includes: gastritis, prostate cancer, HTN, HH, esophagitis, esophageal stricture, ED, malignant mesothelioma      SLP Plan  Continue with current plan of care       Recommendations  Diet recommendations: Dysphagia 3 (mechanical  soft);Nectar-thick liquid Liquids provided via: Cup;No straw Medication Administration: Crushed with puree Supervision: Patient able to self feed;Intermittent supervision to cue for compensatory strategies Compensations: Minimize environmental distractions;Slow rate;Small sips/bites;Follow solids with liquid;Chin tuck Postural Changes and/or Swallow Maneuvers: Seated upright 90 degrees                Follow up Recommendations: Inpatient Rehab SLP Visit Diagnosis: Dysphagia, oropharyngeal phase (R13.12) Plan: Continue with current plan of care       [place], [place], [place] Pathologist [phone]  [name] 01/20/2017, 1:26 PM

## 2017-01-20 NOTE — Progress Notes (Signed)
Please reorder PT and OT which will assist  In planning dispo and which will be needed for any rehab venue authorization with [facility]. [phone]

## 2017-01-20 NOTE — Progress Notes (Signed)
Patient resting in bed with eyes closed.  No noted distress, family at bedside.

## 2017-01-20 NOTE — Evaluation (Signed)
Physical Therapy Evaluation Patient Details Name: Thomas [name] MRN: [id number] DOB: Aug 23, 1925 Today's Date: 01/20/2017   History of Present Illness   Pt is an 83 y.o. male admitted from CIR due to AMS and worsening dysarthria. MRI showed a new 12 mm acute ischemic infarct at the lateral R midbrain/R corticospinal tract with punctate 3 mm R thalamic infarct. Pt had been on CIR for prior R cerebellar and brainstem infarcts. MBS completed on 5/26 recommended Dys 3 diet and nectar thick liquids due to sensed aspiration of thin liquids. He was later advanced to thin liquids clinically. PMH includes: gastritis, prostate cancer, HTN, HH, esophagitis, esophageal stricture, ED, malignant mesothelioma  Clinical Impression  Pt admitted with/for AMS from CIR with MRI showing new infarct in R midbrain.  This was a limited evaluation in that pt too lethargic/sedated to focus/participate optimally and needed significant assist.  Will continue with the mobility assessment next session.  Pt currently limited functionally due to the problems listed. ( See problems list.)   Pt will benefit from PT to maximize function and safety in order to get ready for next venue listed below.     Follow Up Recommendations CIR;Supervision/Assistance - 24 hour    Equipment Recommendations  None recommended by PT    Recommendations for Other Services Rehab consult     Precautions / Restrictions Precautions Precautions: Fall      Mobility  Bed Mobility Overal bed mobility: Needs Assistance Bed Mobility: Supine to Sit;Sit to Supine     Supine to sit: Mod assist Sit to supine: Min assist   General bed mobility comments: some difficulty getting pt to participate coming to EOB, likely due to lethargy.  Once up, pt actively trying to lay down in the bed, but needed help with both LE's  Transfers                 General transfer comment: unable to get pt to arouse enough to focus on task.  Ambulation/Gait                 Stairs            Wheelchair Mobility    Modified Rankin (Stroke Patients Only) Modified Rankin (Stroke Patients Only) Pre-Morbid Rankin Score: No symptoms Modified Rankin: Moderately severe disability     Balance Overall balance assessment: Needs assistance Sitting-balance support: Single extremity supported Sitting balance-Leahy Scale: Poor Sitting balance - Comments: pt unable to focus on task to confirm ability to sit EOB, but did show enough control to attempt forcably lying back in the bed.  Assisted pt to lay in the correct position and reposition in the bed.                                     Pertinent Vitals/Pain Pain Assessment: Faces Faces Pain Scale: No hurt    Home Living Family/patient expects to be discharged to:: Private residence Living Arrangements: Spouse/significant other;Children Available Help at Discharge: Family;Available 24 hours/day Type of Home: House Home Access: Stairs to enter Entrance Stairs-Rails: Right Entrance Stairs-Number of Steps: 2 Home Layout: One level Home Equipment: Walker - 2 wheels;Cane - single point;Bedside commode;Shower seat      Prior Function Level of Independence: Independent with assistive device(s)         Comments: use of cane ~ 1 year     Hand Dominance   Dominant Hand: Right  Extremity/Trunk Assessment        Lower Extremity Assessment Lower Extremity Assessment: Generalized weakness (R appeared weaker than L LE, but MMT difficult due to lethar)       Communication   Communication: HOH  Cognition Arousal/Alertness: Lethargic Behavior During Therapy:  (sedated) Overall Cognitive Status: Impaired/Different from baseline                                 General Comments: 02-17-2023-- pt too sedated/unarousable to determin cognition.      General Comments General comments (skin integrity, edema, etc.): Will need to revisit mobility session Monday as  able.    Exercises     Assessment/Plan    PT Assessment Patient needs continued PT services  PT Problem List Decreased strength;Decreased activity tolerance;Decreased balance;Decreased mobility;Decreased coordination       PT Treatment Interventions DME instruction;Functional mobility training;Therapeutic activities;Balance training;Neuromuscular re-education;Patient/family education    PT Goals (Current goals can be found in the Care Plan section)  Acute Rehab PT Goals Patient Stated Goal: per family to get home PT Goal Formulation: Patient unable to participate in goal setting Time For Goal Achievement: 02/03/17 Potential to Achieve Goals: Fair    Frequency Min 4X/week   Barriers to discharge        Co-evaluation               AM-PAC PT "6 Clicks" Daily Activity  Outcome Measure Difficulty turning over in bed (including adjusting bedclothes, sheets and blankets)?: A Little Difficulty moving from lying on back to sitting on the side of the bed? : Total Difficulty sitting down on and standing up from a chair with arms (e.g., wheelchair, bedside commode, etc,.)?: Total Help needed moving to and from a bed to chair (including a wheelchair)?: A Lot Help needed walking in hospital room?: A Lot Help needed climbing 3-5 steps with a railing? : A Lot 6 Click Score: 11    End of Session   Activity Tolerance: Patient limited by lethargy Patient left: in bed;with call bell/phone within reach;with family/visitor present;with bed alarm set Nurse Communication: Mobility status PT Visit Diagnosis: Other symptoms and signs involving the nervous system (R29.898);Muscle weakness (generalized) (M62.81)    Time: 1500-1520 PT Time Calculation (min) (ACUTE ONLY): 20 min   Charges:   PT Evaluation $PT Eval Moderate Complexity: 1 Procedure     PT G Codes:        Feb 16, 2017  Donnella Sham, PT [phone]  (pager)  Tessie Fass Sophiah Rolin 2017-02-16, 3:36 PM

## 2017-01-21 ENCOUNTER — Encounter (HOSPITAL_COMMUNITY): Payer: Self-pay

## 2017-01-21 LAB — GLUCOSE, CAPILLARY
Glucose-Capillary: 104 mg/dL — ABNORMAL HIGH (ref 65–99)
Glucose-Capillary: 100 mg/dL — ABNORMAL HIGH (ref 65–99)
Glucose-Capillary: 139 mg/dL — ABNORMAL HIGH (ref 65–99)
Glucose-Capillary: 133 mg/dL — ABNORMAL HIGH (ref 65–99)

## 2017-01-21 MED ORDER — STARCH (THICKENING) PO POWD: ORAL | Status: DC | PRN

## 2017-01-21 MED ORDER — RESOURCE THICKENUP CLEAR PO POWD
ORAL | Status: DC | PRN
  Filled 2017-01-21: qty 125

## 2017-01-21 NOTE — Progress Notes (Signed)
PROGRESS NOTE    Thomas [name]  GYK:[id number]  DOB: Sep 19, 1925  DOA: 01/18/2017 PCP: Marin Olp, MD   Brief Admission Hx: Thomas [name] is a 83 y.o. male  past history significant for esophageal stricture, hypertension, prostate cancer and stroke presents as a direct admission from inpatient rehabilitation for responsiveness. Patient had been an inpatient rehabilitation since 5/29. Patient was actively being treated for UTI with oral Bactrim. In the early afternoon patient was found to be unresponsive. With some stimulation he responded but not appropriately. There was concern for possible stroke the patient was sent to CT. CT scan of the head was negative for new acute stroke. Cardiology was consult by the rehabilitation attending. Decision was made that patient directly admitted back to the acute care hospital setting for evaluation of his unresponsiveness and what appeared to be atrial flutter on EKG.  MDM/Assessment & Plan: Acute CVA with encephalopathy New R midbrain and R thalamic infarcts felt to be due to severe VA, BA and PCA stenosis. Acute delirium - added lorazepam and haldol IM prn and with good results per family MRI New 12 mm acute ischemic nonhemorrhagic infarct at the lateral right midbrain/right corticospinal tract, with punctate 3 mm right thalamic infarct. TSH WNL Neurology consult appreciated, stroke team following.  Added aspirin 81 mg daily to plavix.  Dysarthria/Dysphagia Speech-language pathology consult recommending  Dys 3 diet nectar thick liquids Aspiration Precautions  Hypertension Allow permissive hypertension but gradually normalize in 5-7 days  Atrial fibrillation Eliquis 5 mg bid for stroke prevention When necessary Lopressor for heart rate over 110  Diabetes SS Insulin before meals & at bedtime  CBG (last 3)   Recent Labs  01/20/17 1623 01/20/17 2121 01/21/17 0640  GLUCAP 112* 124* 104*   Hyperlipidemia Continue  statin  UTI Treated fully with bactrim. repeat UA Within normal limits, completed bactrim.   Code Status: DNR except med  DVT Prophylaxis: SCD Family Communication: wife/son at bedside Disposition Plan: CIR recommended, dc CIR or SNF 6/11  Consultants:  Neuro  cardiology  Subjective: Less confusion and delirium per daughter.    Objective: Vitals:   01/21/17 0100 01/21/17 0300 01/21/17 0612 01/21/17 0916  BP: 137/80  (!) 166/98 119/79  Pulse: 75  82 71  Resp: 16 (!) 22 20 18   Temp: 98.5 F (36.9 C)   98.3 F (36.8 C)  TempSrc: Oral     SpO2: 100% 100% 100% 99%    Intake/Output Summary (Last 24 hours) at 01/21/17 1155 Last data filed at 01/20/17 1800  Gross per 24 hour  Intake              240 ml  Output                0 ml  Net              240 ml   There were no vitals filed for this visit.   REVIEW OF SYSTEMS  UTO as patient has confusion and delirium  Exam: BLT:JQZES speech.  No  acute distress.  less confused.  Cardiac: Irregular RR, no murmurs, rubs, or gallops.  Respiratory:Clear. No increased WOB. PQ:ZRAQ, nontender, non-distended, normal bowel sounds  MS:  No edema; No deformity. No cyanosis.  Neuro: awake, alert.  Oriented to person.     Data Reviewed: Basic Metabolic Panel:  Recent Labs Lab 01/16/17 0459 01/18/17 1614 01/19/17 0431 01/20/17 0811  NA 136 136 136 134*  K 3.6 3.6 3.6 3.2*  CL 99*  102 103 101  CO2 27 24 20* 20*  GLUCOSE 127* 120* 103* 176*  BUN 16 22* 19 15  CREATININE 1.30* 1.51* 1.31* 1.20  CALCIUM 9.0 8.9 8.9 9.3  MG  --  2.0  --   --   PHOS  --  4.3  --   --    Liver Function Tests:  Recent Labs Lab 01/18/17 1614  AST 18  ALT 21  ALKPHOS 69  BILITOT 0.6  PROT 6.8  ALBUMIN 2.9*   No results for input(s): LIPASE, AMYLASE in the last 168 hours. No results for input(s): AMMONIA in the last 168 hours. CBC:  Recent Labs Lab 01/15/17 0907 01/16/17 0459 01/18/17 1614 01/19/17 0431  01/20/17 0811  WBC 6.1 4.9 5.0 4.9 7.0  NEUTROABS 3.9  --  3.6  --   --   HGB 13.4 12.0* 11.5* 12.2* 14.1  HCT 42.7 37.7* 35.6* 38.9* 42.5  MCV 89.5 87.1 87.9 87.6 86.4  PLT 334 285 295 285 359   Cardiac Enzymes: No results for input(s): CKTOTAL, CKMB, CKMBINDEX, TROPONINI in the last 168 hours. CBG (last 3)   Recent Labs  01/20/17 1623 01/20/17 2121 01/21/17 0640  GLUCAP 112* 124* 104*   Recent Results (from the past 240 hour(s))  Urine culture     Status: Abnormal   Collection Time: 01/15/17 12:40 PM  Result Value Ref Range Status   Specimen Description URINE, RANDOM  Final   Special Requests NONE  Final   Culture >=100,000 COLONIES/mL ESCHERICHIA COLI (A)  Final   Report Status 01/17/2017 FINAL  Final   Organism ID, Bacteria ESCHERICHIA COLI (A)  Final      Susceptibility   Escherichia coli - MIC*    AMPICILLIN >=32 RESISTANT Resistant     CEFAZOLIN >=64 RESISTANT Resistant     CEFTRIAXONE <=1 SENSITIVE Sensitive     CIPROFLOXACIN <=0.25 SENSITIVE Sensitive     GENTAMICIN <=1 SENSITIVE Sensitive     IMIPENEM <=0.25 SENSITIVE Sensitive     NITROFURANTOIN <=16 SENSITIVE Sensitive     TRIMETH/SULFA <=20 SENSITIVE Sensitive     AMPICILLIN/SULBACTAM 16 INTERMEDIATE Intermediate     PIP/TAZO <=4 SENSITIVE Sensitive     Extended ESBL NEGATIVE Sensitive     * >=100,000 COLONIES/mL ESCHERICHIA COLI  Culture, blood (Routine X 2) w Reflex to ID Panel     Status: None (Preliminary result)   Collection Time: 01/18/17  7:17 PM  Result Value Ref Range Status   Specimen Description BLOOD RIGHT ANTECUBITAL  Final   Special Requests   Final    BOTTLES DRAWN AEROBIC AND ANAEROBIC Blood Culture results may not be optimal due to an excessive volume of blood received in culture bottles   Culture NO GROWTH 2 DAYS  Final   Report Status PENDING  Incomplete  Culture, blood (Routine X 2) w Reflex to ID Panel     Status: None (Preliminary result)   Collection Time: 01/18/17  7:18 PM   Result Value Ref Range Status   Specimen Description BLOOD RIGHT ARM  Final   Special Requests   Final    BOTTLES DRAWN AEROBIC AND ANAEROBIC Blood Culture results may not be optimal due to an excessive volume of blood received in culture bottles   Culture NO GROWTH 2 DAYS  Final   Report Status PENDING  Incomplete     Studies: No results found. Scheduled Meds: . ALPRAZolam  0.25 mg Oral Once  . apixaban  5 mg Oral BID  .  aspirin EC  81 mg Oral Daily  . insulin aspart  0-9 Units Subcutaneous TID WC  . rosuvastatin  10 mg Oral q1800  . sodium chloride flush  3 mL Intravenous Q12H  . sodium chloride flush  3 mL Intravenous Q12H   Continuous Infusions: . 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

## 2017-01-21 NOTE — Evaluation (Signed)
Occupational Therapy Evaluation Patient Details Name: Thomas [name] MRN: [id number] DOB: Aug 25, 1925 Today's Date: 01/21/2017    History of Present Illness  Pt is an 83 y.o. male admitted from CIR due to AMS and worsening dysarthria. MRI showed a new 12 mm acute ischemic infarct at the lateral R midbrain/R corticospinal tract with punctate 3 mm R thalamic infarct. Pt had been on CIR for prior R cerebellar and brainstem infarcts. PMH includes: gastritis, prostate cancer, HTN, HH, esophagitis, esophageal stricture, ED, malignant mesothelioma   Clinical Impression   Pt known to this therapist from previous admission. Pt admitted from CIR with new R lateral midbrain/corticospinal and R thalamic infarcts after being found with altered mental status. Prior to previous admission, pt was independent with cane for ADL and functional mobility. He currently requires mod assist for toilet transfers as well as min assist for standing grooming tasks and LB ADL. Pt presents with ataxic movements, decreased problem solving skills, and generalized weakness impacting his ability to participate in ADL. He requires min-mod verbal cues throughout tasks for sequencing. Pt would benefit from continued OT services while admitted to improve independence with ADL and functional mobility. Feel pt would best benefit from return to CIR level therapies in order to maximize return to independent PLOF. OT will continue to follow while admitted.     Follow Up Recommendations  CIR;Supervision/Assistance - 24 hour    Equipment Recommendations  None recommended by OT    Recommendations for Other Services Rehab consult     Precautions / Restrictions Precautions Precautions: Fall Restrictions Weight Bearing Restrictions: No      Mobility Bed Mobility Overal bed mobility: Needs Assistance Bed Mobility: Supine to Sit     Supine to sit: Min guard     General bed mobility comments: Min guard assist for safety with VC's  for sequencing.   Transfers Overall transfer level: Needs assistance Equipment used: Rolling walker (2 wheeled) Transfers: Sit to/from Stand Sit to Stand: Min assist         General transfer comment: Min assist for lifting to stand.     Balance Overall balance assessment: Needs assistance Sitting-balance support: Single extremity supported Sitting balance-Leahy Scale: Fair Sitting balance - Comments: Able to sit at EOB for oral care task with supervision.  Postural control: Right lateral lean Standing balance support: Single extremity supported;Bilateral upper extremity supported;During functional activity Standing balance-Leahy Scale: Poor Standing balance comment: Reliant on UE support and external assist.                            ADL either performed or assessed with clinical judgement   ADL Overall ADL's : Needs assistance/impaired Eating/Feeding: Set up;Sitting   Grooming: Minimal assistance;Standing   Upper Body Bathing: Supervision/ safety;Set up;Sitting   Lower Body Bathing: Minimal assistance;Sit to/from stand   Upper Body Dressing : Supervision/safety;Set up;Sitting   Lower Body Dressing: Minimal assistance;Sit to/from stand   Toilet Transfer: Moderate assistance;Ambulation;RW   Toileting- Clothing Manipulation and Hygiene: Minimal assistance;Sit to/from stand         General ADL Comments: Pt with increasing fatigue following standing grooming task at sink to shave. He required up to mod assist with RW for toilet transfers with VC's to avoid R lateral lean.     Vision Baseline Vision/History: Wears glasses Wears Glasses: Reading only Patient Visual Report: No change from baseline Additional Comments: Able to utilize vision functionally throughout session. However, did require VC's to identify items  on the L.     Perception     Praxis      Pertinent Vitals/Pain Pain Assessment: No/denies pain     Hand Dominance Right    Extremity/Trunk Assessment Upper Extremity Assessment Upper Extremity Assessment: Generalized weakness (Ataxia bilaterally)   Lower Extremity Assessment Lower Extremity Assessment: Defer to PT evaluation       Communication Communication Communication: HOH   Cognition Arousal/Alertness: Awake/alert Behavior During Therapy: WFL for tasks assessed/performed Overall Cognitive Status: Impaired/Different from baseline Area of Impairment: Attention;Memory;Following commands;Safety/judgement;Awareness;Problem solving                   Current Attention Level: Selective Memory: Decreased short-term memory Following Commands: Follows one step commands consistently;Follows multi-step commands inconsistently Safety/Judgement: Decreased awareness of safety;Decreased awareness of deficits Awareness: Emergent Problem Solving: Slow processing;Requires verbal cues;Difficulty sequencing General Comments: Pt following one-step commands well and required min VC's for sequencing during familiar shaving task at sink.    General Comments       Exercises     Shoulder Instructions      Home Living Family/patient expects to be discharged to:: Private residence Living Arrangements: Spouse/significant other;Children Available Help at Discharge: Family;Available 24 hours/day Type of Home: House Home Access: Stairs to enter [employer] of Steps: 2 Entrance Stairs-Rails: Right Home Layout: One level     Bathroom Shower/Tub: Tub/shower unit;Door   [employer] Toilet: Standard Bathroom Accessibility: Yes How Accessible: Accessible via walker Home Equipment: Walker - 2 wheels;Cane - single point;Bedside commode;Shower seat          Prior Functioning/Environment Level of Independence: Independent with assistive device(s);Independent (Prior to previous admission)        Comments: use of cane ~ 1 year        OT Problem List: Decreased activity tolerance;Decreased  strength;Impaired balance (sitting and/or standing);Decreased cognition;Decreased coordination;Decreased safety awareness;Decreased knowledge of use of DME or AE;Decreased knowledge of precautions;Impaired UE functional use      OT Treatment/Interventions: Self-care/ADL training;Therapeutic exercise;Neuromuscular education;Energy conservation;DME and/or AE instruction;Splinting;Cognitive remediation/compensation;Patient/family education;Visual/perceptual remediation/compensation;Therapeutic activities    OT Goals(Current goals can be found in the care plan section) Acute Rehab OT Goals Patient Stated Goal: to get back to rehab OT Goal Formulation: With patient/family Time For Goal Achievement: 02/04/17 Potential to Achieve Goals: Good  OT Frequency: Min 3X/week   Barriers to D/C:            Co-evaluation              AM-PAC PT "6 Clicks" Daily Activity     Outcome Measure Help from another person eating meals?: A Little Help from another person taking care of personal grooming?: A Little Help from another person toileting, which includes using toliet, bedpan, or urinal?: A Lot Help from another person bathing (including washing, rinsing, drying)?: A Little Help from another person to put on and taking off regular upper body clothing?: A Little Help from another person to put on and taking off regular lower body clothing?: A Little 6 Click Score: 17   End of Session Equipment Utilized During Treatment: Gait belt;Rolling walker Nurse Communication: Mobility status;Precautions  Activity Tolerance: Patient tolerated treatment well Patient left: in chair;with call bell/phone within reach;with family/visitor present  OT Visit Diagnosis: Muscle weakness (generalized) (M62.81);Ataxia, unspecified (R27.0);Other abnormalities of gait and mobility (R26.89);Unsteadiness on feet (R26.81)                Time: 1001-1020 OT Time Calculation (min): 19 min Charges:  OT General Charges $  OT  Visit: 1 Procedure OT Evaluation $OT Eval Moderate Complexity: 1 Procedure G-Codes:     Norman Herrlich, MS OTR/L  Pager: [place] [facility] Imajean Mcdermid 01/21/2017, 1:07 PM

## 2017-01-22 ENCOUNTER — Encounter (HOSPITAL_COMMUNITY): Payer: Self-pay | Admitting: *Deleted

## 2017-01-22 ENCOUNTER — Ambulatory Visit: Payer: Self-pay | Admitting: Neurology

## 2017-01-22 ENCOUNTER — Inpatient Hospital Stay (HOSPITAL_COMMUNITY): Payer: Medicare Other

## 2017-01-22 LAB — GLUCOSE, CAPILLARY
GLUCOSE-CAPILLARY: 165 mg/dL — AB (ref 65–99)
GLUCOSE-CAPILLARY: 126 mg/dL — AB (ref 65–99)
Glucose-Capillary: 134 mg/dL — ABNORMAL HIGH (ref 65–99)

## 2017-01-22 NOTE — Progress Notes (Signed)
  Speech Language Pathology Treatment: Dysphagia  Patient Details Name: Thomas [name] MRN: [id number] DOB: [birth year] Today's Date: 01/22/2017 Time: 1224-8250 SLP Time Calculation (min) (ACUTE ONLY): 15 min  Assessment / Plan / Recommendation Clinical Impression  Pt seen for dysphagia treatment.  No recall of instructions to tuck chin for airway protection.  Required cues for 100% of swallows initially, fading to independence by end of session.  No intellectual awareness of deficits.  Please provide full supervision during meals to ensure pt carries out precautions.    HPI HPI: Pt is an 83 y.o.maleadmitted from CIR due to AMS and worsening dysarthria. MRI showed a new 12 mm acute ischemic infarct at the lateral R midbrain/R corticospinal tract with punctate 3 mm R thalamic infarct. Pt had been non CIR for prior R cerebellar and brainstem infarcts. MBS completed on 5/26 recommended Dys 3 diet and nectar thick liquids due to sensed aspiration of thin liquids. He was later advanced to thin liquids clinically. Repeat MBS after recent acute neuro event showed worsening swallow, with silent aspiration of nectars noted, requiring chin tuck to protect airway. PMH includes: gastritis, prostate cancer, HTN, HH, esophagitis, esophageal stricture, ED, malignant mesothelioma      SLP Plan  Continue with current plan of care       Recommendations  Diet recommendations: Dysphagia 3 (mechanical soft);Nectar-thick liquid Liquids provided via: Cup;No straw Medication Administration: Crushed with puree Supervision: Patient able to self feed;Full supervision/cueing for compensatory strategies Compensations: Minimize environmental distractions;Slow rate;Chin tuck Postural Changes and/or Swallow Maneuvers: Seated upright 90 degrees                Oral Care Recommendations: Oral care BID Follow up Recommendations: Inpatient Rehab SLP Visit Diagnosis: Dysphagia, oropharyngeal phase (R13.12) Plan:  Continue with current plan of care       GO                Thomas [name] 01/22/2017, 9:59 AM

## 2017-01-22 NOTE — Progress Notes (Signed)
01/22/2017 3:59 PM  Met with family.  They decided to pursue SNF placement at [facility].  Plan discharge 6/12 to [facility] Sabino Niemann, MD

## 2017-01-22 NOTE — Progress Notes (Signed)
Occupational Therapy Treatment Patient Details Name: Thomas [name] MRN: [id number] DOB: 21-Jul-1926 Today's Date: 01/22/2017    History of present illness  Pt is an 83 y.o. male admitted from CIR due to AMS and worsening dysarthria. MRI showed a new 12 mm acute ischemic infarct at the lateral R midbrain/R corticospinal tract with punctate 3 mm R thalamic infarct. Pt had been on CIR for prior R cerebellar and brainstem infarcts. PMH includes: gastritis, prostate cancer, HTN, HH, esophagitis, esophageal stricture, ED, malignant mesothelioma   OT comments  Pt making good progress toward goals. Continue ot recommend DC to CIR for rehab. Will continue to follow acutely.  Follow Up Recommendations  CIR;Supervision/Assistance - 24 hour    Equipment Recommendations  None recommended by OT    Recommendations for Other Services Rehab consult    Precautions / Restrictions Precautions Precautions: Fall Restrictions Weight Bearing Restrictions: No       Mobility Bed Mobility Overal bed mobility: Needs Assistance Bed Mobility: Supine to Sit     Supine to sit: Min guard     General bed mobility comments: pt OOB in chair  Transfers Overall transfer level: Needs assistance Equipment used: Rolling walker (2 wheeled) Transfers: Sit to/from Stand Sit to Stand: Min assist         General transfer comment: R bias    Balance Overall balance assessment: Needs assistance   Sitting balance-Leahy Scale: Fair   Postural control: R bias   Standing balance-Leahy Scale: Poor Standing balance comment: R bias                           ADL either performed or assessed with clinical judgement   ADL Overall ADL's : Needs assistance/impaired     Grooming: Standing;Minimal assistance                   Toilet Transfer: Moderate assistance;RW;Comfort height toilet;Ambulation   Toileting- Clothing Manipulation and Hygiene: Minimal assistance;Sit to/from stand        Functional mobility during ADLs: Moderate assistance;Rolling walker;Cueing for safety;Cueing for sequencing General ADL Comments: poor midline positioning during ADL. R bias. Leaning on wall to R while on toilet     Vision   Additional Comments: will further assess   Perception     Praxis      Cognition Arousal/Alertness: Awake/alert Behavior During Therapy: WFL for tasks assessed/performed Overall Cognitive Status: Impaired/Different from baseline Area of Impairment: Orientation;Attention;Memory;Following commands;Safety/judgement;Awareness;Problem solving                 Orientation Level: Disoriented to;Situation;Time Current Attention Level: Selective Memory: Decreased recall of precautions;Decreased short-term memory Following Commands: Follows one step commands consistently Safety/Judgement: Decreased awareness of safety;Decreased awareness of deficits Awareness: Emergent Problem Solving: Slow processing;Difficulty sequencing General Comments: Pt backing up to chair and attempted sitting on floor and required Max A to prevent fall        Exercises Exercises:  (warm up  ROM exercise to bil LE prior to mobility)   Shoulder Instructions       General Comments      Pertinent Vitals/ Pain       Pain Assessment: No/denies pain Faces Pain Scale: No hurt  Home Living                                          Prior Functioning/Environment  Frequency  Min 3X/week        Progress Toward Goals  OT Goals(current goals can now be found in the care plan section)  Progress towards OT goals: Progressing toward goals  Acute Rehab OT Goals Patient Stated Goal: to get back to rehab OT Goal Formulation: With patient/family Time For Goal Achievement: 02/04/17 Potential to Achieve Goals: Good ADL Goals Pt Will Perform Grooming: with modified independence;standing Pt Will Perform Upper Body Bathing: with modified  independence;standing Pt Will Perform Lower Body Bathing: with modified independence;sit to/from stand Pt Will Perform Upper Body Dressing: with modified independence;sitting Pt Will Perform Lower Body Dressing: sit to/from stand;with supervision Pt Will Transfer to Toilet: with min guard assist;ambulating;regular height toilet;bedside commode Pt Will Perform Toileting - Clothing Manipulation and hygiene: with supervision;sit to/from stand Pt Will Perform Tub/Shower Transfer: with supervision;ambulating;Tub transfer;shower seat;rolling walker Pt/caregiver will Perform Home Exercise Program: Both right and left upper extremity;With written HEP provided Additional ADL Goal #1: Pt will demonstrate alternating attention during ADL task in moderately distracting environment.   Plan Discharge plan remains appropriate    Co-evaluation                 AM-PAC PT "6 Clicks" Daily Activity     Outcome Measure   Help from another person eating meals?: A Little Help from another person taking care of personal grooming?: A Little Help from another person toileting, which includes using toliet, bedpan, or urinal?: A Lot Help from another person bathing (including washing, rinsing, drying)?: A Little Help from another person to put on and taking off regular upper body clothing?: A Little Help from another person to put on and taking off regular lower body clothing?: A Little 6 Click Score: 17    End of Session Equipment Utilized During Treatment: Rolling walker  OT Visit Diagnosis: Muscle weakness (generalized) (M62.81);Ataxia, unspecified (R27.0);Other abnormalities of gait and mobility (R26.89);Unsteadiness on feet (R26.81)   Activity Tolerance Patient tolerated treatment well   Patient Left in chair;with call bell/phone within reach;with chair alarm set   Nurse Communication Mobility status        Time: 2458-0998 OT Time Calculation (min): 20 min  Charges: OT General Charges $OT  Visit: 1 Procedure OT Treatments $Self Care/Home Management : 8-22 mins  [facility], OT/L  [phone] 01/22/2017   Halee Glynn,HILLARY 01/22/2017, 12:04 PM

## 2017-01-22 NOTE — Care Management Important Message (Signed)
Important Message  Patient Details  Name: Thomas [name] MRN: [id number] Date of Birth: March 06, 1926   Medicare Important Message Given:  Yes    Merrill Deanda 01/22/2017, 12:08 PM

## 2017-01-22 NOTE — Progress Notes (Signed)
I met with pt, his wife, son, daughter, Ivin Booty at bedside with daughter, Freda Munro on speaker phone with myself and Dr. Wynetta Emery to clarify medical issues as well as discuss disposition options. I reiterated that Dr. Letta Pate felt that if plans were for pt to eventually d/c to [facility] SNF due to lack of 24/7 caregiver support, that dispo should be direct d/c to SNF rather than readmit to CIR. If family intends for d/c home with 24/7 caregiver support, goals for rehab would include family education to prepare for home d/c. Family state caregiver support is not available at that level, therefore SNF rehab will be the disposition. I have updated SW, Kathlee Nations, and we will sign off at this time. Freda Munro requested a medicaid application be initiated prior to d/c of pt tomorrow. I will contact financial counselor with that request. [phone]

## 2017-01-22 NOTE — NC FL2 (Signed)
[place] LEVEL OF CARE SCREENING TOOL     IDENTIFICATION  Patient Name: Thomas [name] Birthdate: [birth year] Sex: male Admission Date (Current Location): 01/18/2017  [facility] and [place] Number:  Herbalist and Address:  [facility] [place], [place] [address], [place], [place] [address]      Provider Number: [id number]  Attending Physician Name and Address:  Murlean Iba, MD  Relative Name and Phone Number:       Current Level of Care: Hospital Recommended Level of Care: [place] Prior Approval Number:    Date Approved/Denied:   PASRR Number: [id number]  Discharge Plan: SNF    Current Diagnoses: Patient Active Problem List   Diagnosis Date Noted  . Acute encephalopathy 01/18/2017  . Cerebrovascular accident (CVA) due to stenosis of basilar artery ([place]) 01/09/2017  . Ataxia   . Benign essential HTN   . Esophageal stricture   . PAF (paroxysmal atrial fibrillation) ([place])   . Recurrent left pleural effusion   . Dysphagia, post-stroke   . Type 2 diabetes mellitus with circulatory disorder ([place])   . Acute blood loss anemia   . Mesothelioma of left lung ([place])   . Palliative care by specialist   . Stroke due to embolism of posterior inferior cerebral artery ([place]) d/t AF 01/05/2017  . Goals of care, counseling/discussion 12/23/2016  . Encounter for antineoplastic chemotherapy 12/23/2016  . Vertigo following cerebrovascular accident 12/16/2016  . Hyperkalemia 12/16/2016  . Type 2 diabetes mellitus with hyperlipidemia ([place]) 12/16/2016  . Insomnia 12/07/2016  . Atelectasis 10/30/2016  . Alcohol abuse 08/04/2016  . Permanent atrial fibrillation ([place]) 07/21/2016  . Malignant mesothelioma of pleura ([place]) 02/16/2016  . Perforation of right tympanic membrane 11/24/2013  . Carotid stenosis 02/21/2013  . Intracranial vascular stenosis 02/17/2013  . Hypokalemia 11/26/2012  . GERD (gastroesophageal reflux disease)  05/17/2011  . Dysphagia 05/17/2011  . Stricture and stenosis of esophagus 08/29/2010  . Allergic rhinitis 01/16/2008  . 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 PROSTATE CANCER, HX OF 03/26/2007  . Hyperlipidemia 03/20/2007  . Essential hypertension 03/20/2007    Orientation RESPIRATION BLADDER Height & Weight     Self, Situation, Place  Normal Incontinent Weight:   Height:     BEHAVIORAL SYMPTOMS/MOOD NEUROLOGICAL BOWEL NUTRITION STATUS      Incontinent Diet (mechanical soft)  AMBULATORY STATUS COMMUNICATION OF NEEDS Skin   Extensive Assist Verbally Normal                       Personal Care Assistance Level of Assistance  Bathing, Dressing Bathing Assistance: Maximum assistance   Dressing Assistance: Maximum assistance     Functional Limitations Info             SPECIAL CARE FACTORS FREQUENCY  PT (By licensed PT), OT (By licensed OT)     PT Frequency: 5x/wk OT Frequency: 5x/wk            Contractures      Additional Factors Info  Code Status, Allergies, Insulin Sliding Scale Code Status Info: DNR Allergies Info: Ace Inhibitors, Penicillins   Insulin Sliding Scale Info: 3x/day       Current Medications (01/22/2017):  This is the current hospital active medication list Current Facility-Administered Medications  Medication Dose Route Frequency Provider Last Rate Last Dose  . 0.9 %  sodium chloride infusion  250 mL Intravenous PRN Wierzba, Clanford L, MD      . acetaminophen (TYLENOL) tablet  650 mg  650 mg Oral Q6H PRN Elwin Mocha, MD       Or  . acetaminophen (TYLENOL) suppository 650 mg  650 mg Rectal Q6H PRN Elwin Mocha, MD      . ALPRAZolam Duanne Moron) tablet 0.25 mg  0.25 mg Oral Once Rise Patience, MD      . apixaban Arne Cleveland) tablet 5 mg  5 mg Oral BID Rosalin Hawking, MD   5 mg at 01/22/17 1052  . aspirin EC tablet 81 mg  81 mg Oral Daily Rosalin Hawking, MD   81 mg at 01/22/17 1052  . haloperidol lactate (HALDOL) injection 1 mg  1  mg Intramuscular Q6H PRN Wynetta Emery, Clanford L, MD   1 mg at 01/20/17 1059  . hydrALAZINE (APRESOLINE) injection 10 mg  10 mg Intravenous Q8H PRN Elwin Mocha, MD      . insulin aspart (novoLOG) injection 0-9 Units  0-9 Units Subcutaneous TID WC Elwin Mocha, MD   1 Units at 01/22/17 1329  . LORazepam (ATIVAN) injection 0.5 mg  0.5 mg Intramuscular Q4H PRN Malak, Clanford L, MD   0.5 mg at 01/20/17 1300  . metoprolol tartrate (LOPRESSOR) injection 5 mg  5 mg Intravenous Q8H PRN Elwin Mocha, MD      . ondansetron [facility]) tablet 4 mg  4 mg Oral Q6H PRN Elwin Mocha, MD       Or  . ondansetron [facility]) injection 4 mg  4 mg Intravenous Q6H PRN Elwin Mocha, MD      . RESOURCE THICKENUP CLEAR   Oral PRN Wynetta Emery, Clanford L, MD      . rosuvastatin (CRESTOR) tablet 10 mg  10 mg Oral q1800 Elwin Mocha, MD   10 mg at 01/21/17 1745  . sodium chloride flush (NS) 0.9 % injection 3 mL  3 mL Intravenous Q12H Elwin Mocha, MD   3 mL at 01/19/17 2124  . sodium chloride flush (NS) 0.9 % injection 3 mL  3 mL Intravenous Q12H Shorten, Clanford L, MD      . sodium chloride flush (NS) 0.9 % injection 3 mL  3 mL Intravenous PRN Wynetta Emery, Clanford L, MD         Discharge Medications: Please see discharge summary for a list of discharge medications.  Relevant Imaging Results:  Relevant Lab Results:   Additional Information SS#: [id number]  Geralynn Ochs, LCSW

## 2017-01-22 NOTE — Progress Notes (Signed)
Patient woke up very anxious, incontinent of bowel and bladder. Patient provided full bath and linen change.  When patient ambulating patient leans to right side. Patient denies h/a, change in vision, pain, numbness or tingling. No change in NIH. Patient called wife stating she needed to come and get  Him. RN spoke with patients daughter which states "this is how it was when everything changed downstairs." RN reassured patients daughter, Thomas [name], and patient. RN re-educated patient and family on s/s of stroke. RN continually reassuring and reorienting patient. patient given nectar thick coffee, sitting in recliner, chair alarm activated. Neurology notified, repeat MRI per Dr. Leonel Ramsay. RN will continue to monitor patient.

## 2017-01-22 NOTE — Progress Notes (Signed)
Physical Therapy Treatment Patient Details Name: Thomas [name] MRN: [id number] DOB: [birth year] Today's Date: 01/22/2017    History of Present Illness  Pt is an 83 y.o. male admitted from CIR due to AMS and worsening dysarthria. MRI showed a new 12 mm acute ischemic infarct at the lateral R midbrain/R corticospinal tract with punctate 3 mm R thalamic infarct. Pt had been on CIR for prior R cerebellar and brainstem infarcts. PMH includes: gastritis, prostate cancer, HTN, HH, esophagitis, esophageal stricture, ED, malignant mesothelioma    PT Comments    Much more participative and able to focus.  Emphasis on transfers and gait stability/stamina.   Follow Up Recommendations  CIR;Supervision/Assistance - 24 hour     Equipment Recommendations  None recommended by PT    Recommendations for Other Services Rehab consult     Precautions / Restrictions Precautions Precautions: Fall    Mobility  Bed Mobility Overal bed mobility: Needs Assistance Bed Mobility: Supine to Sit     Supine to sit: Min guard     General bed mobility comments: much improved over eval with pt able to focus well on task.  Transfers Overall transfer level: Needs assistance Equipment used: Rolling walker (2 wheeled) Transfers: Sit to/from Stand Sit to Stand: Min assist         General transfer comment: assist to boost to stand.  Ambulation/Gait Ambulation/Gait assistance: Min assist;+2 safety/equipment Ambulation Distance (Feet): 90 Feet (x2 with rest in between) Assistive device: Rolling walker (2 wheeled) Gait Pattern/deviations: Step-through pattern Gait velocity: Decreased Gait velocity interpretation: Below normal speed for age/gender General Gait Details: Gait continues to be a little uncoordinated on the R LE with R should elevated and R Ue held locked in extension when using the RW.  Pt unable to maintain R sided corrections.  Pt listing R on occasion and kicking the R rear leg of the RW on  occasion.   Stairs            Wheelchair Mobility    Modified Rankin (Stroke Patients Only) Modified Rankin (Stroke Patients Only) Pre-Morbid Rankin Score: No symptoms Modified Rankin: Moderately severe disability     Balance Overall balance assessment: Needs assistance   Sitting balance-Leahy Scale: Fair   Postural control: Other (comment) (biased R mildly)   Standing balance-Leahy Scale: Poor Standing balance comment: Reliant on UE support and external assist.                             Cognition Arousal/Alertness: Awake/alert Behavior During Therapy: WFL for tasks assessed/performed Overall Cognitive Status: Impaired/Different from baseline                     Current Attention Level: Selective Memory: Decreased short-term memory Following Commands: Follows one step commands consistently;Follows one step commands with increased time Safety/Judgement: Decreased awareness of deficits Awareness: Emergent Problem Solving: Slow processing        Exercises      General Comments        Pertinent Vitals/Pain Pain Assessment: No/denies pain Faces Pain Scale: No hurt    Home Living                      Prior Function            PT Goals (current goals can now be found in the care plan section) Acute Rehab PT Goals Patient Stated Goal: to get back to rehab PT Goal  Formulation: With patient Time For Goal Achievement: 02/03/17 Potential to Achieve Goals: Fair Progress towards PT goals: Progressing toward goals    Frequency    Min 4X/week      PT Plan Current plan remains appropriate    Co-evaluation              AM-PAC PT "6 Clicks" Daily Activity  Outcome Measure  Difficulty turning over in bed (including adjusting bedclothes, sheets and blankets)?: Total Difficulty moving from lying on back to sitting on the side of the bed? : Total Difficulty sitting down on and standing up from a chair with arms (e.g.,  wheelchair, bedside commode, etc,.)?: A Little Help needed moving to and from a bed to chair (including a wheelchair)?: A Little Help needed walking in hospital room?: A Little Help needed climbing 3-5 steps with a railing? : A Lot 6 Click Score: 13    End of Session   Activity Tolerance: Patient tolerated treatment well Patient left: in chair;with call bell/phone within reach;with chair alarm set Nurse Communication: Mobility status PT Visit Diagnosis: Unsteadiness on feet (R26.81);Other symptoms and signs involving the nervous system (R29.898)     Time: 1005-1030 PT Time Calculation (min) (ACUTE ONLY): 25 min  Charges:  $Gait Training: 8-22 mins $Therapeutic Activity: 8-22 mins                    G Codes:       2017-01-26  Donnella Sham, PT [phone]  (pager)   Thomas [name] January 26, 2017, 11:54 AM

## 2017-01-22 NOTE — Progress Notes (Signed)
Cardiology Office Note   Date:  01/25/2017   ID:  Thomas [name], DOB [birth year], MRN [id number]  PCP:  Marin Olp, MD  Cardiologist:   Minus Breeding, MD  Referring:  Marin Olp, MD  Chief Complaint  Patient presents with  . Atrial Fibrillation     History of Present Illness: Thomas [name] is a 83 y.o. male who presents for evaluation of atrial fibrillation.  An echo demonstrated a mildly reduced EF of 40 - 45% with mild MR.   He has had HTN.  He was in the hospital with CVA.   I saw him in consultation for episodes of brief AMS.  He was in chronic atrial fib.  He did have a head CT.  He did have new acute lateral right midbrain/right corticospinal tract findings suggesting new stroke but it was not clear that this was the cause of transient AMS.  He was placed on tele but there was no arrhythmia that was thought to be related.   He was discharged on ASA and Eliquis.      He is at rehab.  He has had no further events.  He is doing some mild walking with a walker.  The patient denies any new symptoms such as chest discomfort, neck or arm discomfort. There has been no new shortness of breath, PND or orthopnea. There have been no reported palpitations, presyncope or syncope.  He is weak and has left sided weakness.   Past Medical History:  Diagnosis Date  . Adenomatous colon polyp 2009  . Allergy   . Carotid artery occlusion   . Diverticulosis of colon (without mention of hemorrhage)   . ED (erectile dysfunction)   . Esophageal stricture   . Esophagitis   . GERD (gastroesophageal reflux disease)   . Goals of care, counseling/discussion 12/23/2016  . Hiatal hernia   . Hypertension   . Prostate cancer ([place])   . Unspecified gastritis and gastroduodenitis without mention of hemorrhage     Past Surgical History:  Procedure Laterality Date  . ENDARTERECTOMY Left 03/05/2013   Procedure: ENDARTERECTOMY CAROTID;  Surgeon: Conrad Troy, MD;  Location: [facility];   Service: Vascular;  Laterality: Left;  . ESOPHAGOGASTRODUODENOSCOPY (EGD) WITH ESOPHAGEAL DILATION    . PROSTATE SURGERY     Laser surgery     Current Outpatient Prescriptions  Medication Sig Dispense Refill  . acetaminophen (TYLENOL) 325 MG tablet Take 2 tablets (650 mg total) by mouth every 6 (six) hours as needed for mild pain, moderate pain or fever (or Fever >/= 101).    Marland Kitchen amLODipine (NORVASC) 5 MG tablet Take 1 tablet (5 mg total) by mouth daily. 30 tablet 3  . apixaban (ELIQUIS) 5 MG TABS tablet Take 5 mg by mouth 2 (two) times daily.    Marland Kitchen aspirin EC 81 MG EC tablet Take 1 tablet (81 mg total) by mouth daily.    . cloNIDine (CATAPRES) 0.1 MG tablet Take 1 tablet (0.1 mg total) by mouth 2 (two) times daily. 90 tablet 4  . haloperidol (HALDOL) 1 MG tablet Take 1 tablet (1 mg total) by mouth every 8 (eight) hours as needed for agitation. 90 tablet 2  . LORazepam (ATIVAN) 0.5 MG tablet Take 1 tablet (0.5 mg total) by mouth every 8 (eight) hours as needed for anxiety, sedation or sleep. 10 tablet 0  . metoCLOPramide (REGLAN) 5 MG tablet Take 1 tablet (5 mg total) by mouth every 8 (eight) hours as needed for  nausea. 15 tablet 0  . rosuvastatin (CRESTOR) 20 MG tablet Take 1 tablet (20 mg total) by mouth daily.     No current facility-administered medications for this visit.     Allergies:   Ace inhibitors and Penicillins   ROS:  Please see the history of present illness.   Otherwise, review of systems are positive for none.   All other systems are reviewed and negative.    PHYSICAL EXAM: VS:  BP (!) 150/88   Pulse 84   Ht 5\' 10"  (1.778 m)   Wt 156 lb (70.8 kg)   BMI 22.38 kg/m  , BMI Body mass index is 22.38 kg/m.  GENERAL:  Frail appearing NECK:  No jugular venous distention, waveform within normal limits, carotid upstroke brisk and symmetric, no bruits, no thyromegaly LYMPHATICS:  No cervical, inguinal adenopathy LUNGS:  Clear to auscultation bilaterally CHEST:   Unremarkable HEART:  PMI not displaced or sustained,S1 and S2 within normal limits, no S3, no clicks, no rubs, no murmurs, irregular ABD:  Flat, positive bowel sounds normal in frequency in pitch, no bruits, no rebound, no guarding, no midline pulsatile mass, no hepatomegaly, no splenomegaly EXT:  2 plus pulses upper, trace ankle edema, no cyanosis no clubbing   EKG:  EKG is not ordered today.    Recent Labs: 02/16/2016: Pro B Natriuretic peptide (BNP) 69.0 01/18/2017: ALT 21; Magnesium 2.0; TSH 2.406 01/20/2017: BUN 15; Creatinine, Ser 1.20; Hemoglobin 14.1; Platelets 359; Potassium 3.2; Sodium 134    Lipid Panel    Component Value Date/Time   CHOL 131 01/06/2017 0213   TRIG 91 01/06/2017 0213   HDL 28 (L) 01/06/2017 0213   CHOLHDL 4.7 01/06/2017 0213   VLDL 18 01/06/2017 0213   LDLCALC 85 01/06/2017 0213   LDLDIRECT 143 (H) 08/09/2016 1202      Wt Readings from Last 3 Encounters:  01/25/17 156 lb (70.8 kg)  01/22/17 151 lb 0.2 oz (68.5 kg)  01/17/17 151 lb 0.2 oz (68.5 kg)      Other studies Reviewed: Additional studies/ records that were reviewed today include:   Hospital records Review of the above records demonstrates:  See elsewhere   ASSESSMENT AND PLAN:   ATRIAL FIB:   [name] has a CHA2DS2 - VASc score of 3 with a risk of stroke of 3.3%.  I would like to apply an event monitor to make sure some of his altered mental episodes are not related to bradycardia arrhythmia. Thomas [name] will need a 21 day event monitor.  The patients symptoms necessitate an event monitor.  The symptoms are too infrequent to be identified on a Holter monitor.    CM:  EF was actually better in the hospital.  There was mild MR.  No change in therapy is planned.   HTN:   The blood pressure is slightly elevated but we are allowing permissive hypertension. No change in therapy is planned.   DYSLIPIDEMIA:  LDL was well controlled.  He will continue meds as listed.   Current  medicines are reviewed at length with the patient today.  The patient does not have concerns regarding medicines.  The following changes have been made:  None  Labs/ tests ordered today include:    Orders Placed This Encounter  Procedures  . Cardiac event monitor     Disposition:   FU with me in six months. Ronnell Guadalajara, MD  01/25/2017 10:49 AM    [facility]

## 2017-01-22 NOTE — Progress Notes (Signed)
PROGRESS NOTE    Thomas [name]  IRW:[id number]  DOB: 09/10/1925  DOA: 01/18/2017 PCP: Marin Olp, MD   Brief Admission Hx: Thomas [name] is a 83 y.o. male  past history significant for esophageal stricture, hypertension, prostate cancer and stroke presents as a direct admission from inpatient rehabilitation for responsiveness. Patient had been an inpatient rehabilitation since 5/29. Patient was actively being treated for UTI with oral Bactrim. In the early afternoon patient was found to be unresponsive. With some stimulation he responded but not appropriately. There was concern for possible stroke the patient was sent to CT. CT scan of the head was negative for new acute stroke. Cardiology was consult by the rehabilitation attending. Decision was made that patient directly admitted back to the acute care hospital setting for evaluation of his unresponsiveness and what appeared to be atrial flutter on EKG.  MDM/Assessment & Plan: Acute CVA with encephalopathy New R midbrain and R thalamic infarcts felt to be due to severe VA, BA and PCA stenosis. Acute delirium - added lorazepam and haldol IM prn and with good results per family MRI New 12 mm acute ischemic nonhemorrhagic infarct at the lateral right midbrain/right corticospinal tract, with punctate 3 mm right thalamic infarct. TSH WNL Neurology consult appreciated, stroke team following.  Added aspirin 81 mg daily to plavix for stroke prevention. Pt had a repeat MRI last night, but no new findings reported.   Dysarthria/Dysphagia Speech-language pathology consult recommending  Dys 3 diet nectar thick liquids, crushed with puree Aspiration Precautions  Hypertension Allow permissive hypertension but gradually normalize in 5-7 days  Atrial fibrillation Eliquis 5 mg bid for stroke prevention When necessary Lopressor for heart rate over 110  Diabetes SS Insulin before meals & at bedtime  CBG (last 3)   Recent Labs  01/21/17 2300 01/22/17 0623 01/22/17 1117  GLUCAP 133* 165* 134*   Hyperlipidemia Continue statin therapy   UTI Treated fully with bactrim. repeat UA Within normal limits, completed bactrim.   Code Status: DNR except med  DVT Prophylaxis: SCD Family Communication: family meeting 6/11 Disposition Plan: TBD   Consultants:  Neuro  cardiology  Subjective: Pt had confusion overnight and got another MRI brain done    Objective: Vitals:   01/22/17 0200 01/22/17 0500 01/22/17 1010 01/22/17 1315  BP: (!) 144/89 (!) 172/106 (!) 148/68 (!) 146/72  Pulse: 76  67 67  Resp: 18  20 20   Temp:   98 F (36.7 C) 98.6 F (37 C)  TempSrc:   Oral Oral  SpO2:   99% 98%    Intake/Output Summary (Last 24 hours) at 01/22/17 1530 Last data filed at 01/22/17 1316  Gross per 24 hour  Intake              270 ml  Output                0 ml  Net              270 ml   There were no vitals filed for this visit.   REVIEW OF SYSTEMS  UTO as patient has confusion and delirium  Exam: PYP:PJKDT speech.  No  acute distress.  confused.  Cardiac: Irregular RR, no murmurs, rubs, or gallops.  Respiratory:Clear. No increased WOB. OI:ZTIW, nontender, non-distended, normal bowel sounds  MS:  No edema; No deformity. No cyanosis.  Neuro: awake, alert.  Oriented to person.    Data Reviewed: Basic Metabolic Panel:  Recent Labs Lab 01/16/17 0459 01/18/17 1614  01/19/17 0431 01/20/17 0811  NA 136 136 136 134*  K 3.6 3.6 3.6 3.2*  CL 99* 102 103 101  CO2 27 24 20* 20*  GLUCOSE 127* 120* 103* 176*  BUN 16 22* 19 15  CREATININE 1.30* 1.51* 1.31* 1.20  CALCIUM 9.0 8.9 8.9 9.3  MG  --  2.0  --   --   PHOS  --  4.3  --   --    Liver Function Tests:  Recent Labs Lab 01/18/17 1614  AST 18  ALT 21  ALKPHOS 69  BILITOT 0.6  PROT 6.8  ALBUMIN 2.9*   No results for input(s): LIPASE, AMYLASE in the last 168 hours. No results for input(s): AMMONIA in the last 168  hours. CBC:  Recent Labs Lab 01/16/17 0459 01/18/17 1614 01/19/17 0431 01/20/17 0811  WBC 4.9 5.0 4.9 7.0  NEUTROABS  --  3.6  --   --   HGB 12.0* 11.5* 12.2* 14.1  HCT 37.7* 35.6* 38.9* 42.5  MCV 87.1 87.9 87.6 86.4  PLT 285 295 285 359   Cardiac Enzymes: No results for input(s): CKTOTAL, CKMB, CKMBINDEX, TROPONINI in the last 168 hours. CBG (last 3)   Recent Labs  01/21/17 2300 01/22/17 0623 01/22/17 1117  GLUCAP 133* 165* 134*   Recent Results (from the past 240 hour(s))  Urine culture     Status: Abnormal   Collection Time: 01/15/17 12:40 PM  Result Value Ref Range Status   Specimen Description URINE, RANDOM  Final   Special Requests NONE  Final   Culture >=100,000 COLONIES/mL ESCHERICHIA COLI (A)  Final   Report Status 01/17/2017 FINAL  Final   Organism ID, Bacteria ESCHERICHIA COLI (A)  Final      Susceptibility   Escherichia coli - MIC*    AMPICILLIN >=32 RESISTANT Resistant     CEFAZOLIN >=64 RESISTANT Resistant     CEFTRIAXONE <=1 SENSITIVE Sensitive     CIPROFLOXACIN <=0.25 SENSITIVE Sensitive     GENTAMICIN <=1 SENSITIVE Sensitive     IMIPENEM <=0.25 SENSITIVE Sensitive     NITROFURANTOIN <=16 SENSITIVE Sensitive     TRIMETH/SULFA <=20 SENSITIVE Sensitive     AMPICILLIN/SULBACTAM 16 INTERMEDIATE Intermediate     PIP/TAZO <=4 SENSITIVE Sensitive     Extended ESBL NEGATIVE Sensitive     * >=100,000 COLONIES/mL ESCHERICHIA COLI  Culture, blood (Routine X 2) w Reflex to ID Panel     Status: None (Preliminary result)   Collection Time: 01/18/17  7:17 PM  Result Value Ref Range Status   Specimen Description BLOOD RIGHT ANTECUBITAL  Final   Special Requests   Final    BOTTLES DRAWN AEROBIC AND ANAEROBIC Blood Culture results may not be optimal due to an excessive volume of blood received in culture bottles   Culture NO GROWTH 3 DAYS  Final   Report Status PENDING  Incomplete  Culture, blood (Routine X 2) w Reflex to ID Panel     Status: None (Preliminary  result)   Collection Time: 01/18/17  7:18 PM  Result Value Ref Range Status   Specimen Description BLOOD RIGHT ARM  Final   Special Requests   Final    BOTTLES DRAWN AEROBIC AND ANAEROBIC Blood Culture results may not be optimal due to an excessive volume of blood received in culture bottles   Culture NO GROWTH 3 DAYS  Final   Report Status PENDING  Incomplete     Studies: Mr Brain Wo Contrast  Result Date: 01/22/2017 CLINICAL DATA:  Altered mental status. Weakness. Dysarthria. Right mid brain infarction and right thalamic infarction. EXAM: MRI HEAD WITHOUT CONTRAST TECHNIQUE: Multiplanar, multiecho pulse sequences of the brain and surrounding structures were obtained without intravenous contrast. COMPARISON:  01/18/2017.  01/06/2017. FINDINGS: Brain: Diffusion imaging does not show any newly seen insult since the study of 4 days ago. Expected evolutionary changes of recent right cerebellar, right brainstem, right midbrain, right thalamus and right occipital infarctions. Older infarctions within both occipital lobes appear the same. Chronic small-vessel ischemic changes throughout the deep and subcortical white matter appear the same. No evidence of hemorrhage, mass lesion, hydrocephalus or extra-axial collection. Vascular: Major vessels at the base of the brain show flow. Skull and upper cervical spine: Negative Sinuses/Orbits: Clear/ normal. Small amount of fluid in the mastoid air cells. Other: None significant IMPRESSION: No new insult since the previous study. Expected evolutionary changes continue in the region of infarction in the right cerebellum, right brainstem, right midbrain, right thalamus and right occipital lobe. Older ischemic changes elsewhere appear the same. Electronically Signed   By: Nelson Chimes M.D.   On: 01/22/2017 09:33   Scheduled Meds: . ALPRAZolam  0.25 mg Oral Once  . apixaban  5 mg Oral BID  . aspirin EC  81 mg Oral Daily  . insulin aspart  0-9 Units Subcutaneous TID  WC  . rosuvastatin  10 mg Oral q1800  . sodium chloride flush  3 mL Intravenous Q12H  . sodium chloride flush  3 mL Intravenous Q12H   Continuous Infusions: . 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

## 2017-01-22 NOTE — Progress Notes (Signed)
Received phone call from daughter, Ivin Booty, requesting a meeting with Hospitalist MD and myself to discuss pt's medical progress and dispo plans from now. I contacted Dr. Wynetta Emery and he will try to be available to meet with family. I contacted Kathlee Nations, SW to be aware that dispo plans will be clarified later today. [phone]

## 2017-01-23 LAB — CULTURE, BLOOD (ROUTINE X 2)
CULTURE: NO GROWTH
Culture: NO GROWTH

## 2017-01-23 LAB — GLUCOSE, CAPILLARY
GLUCOSE-CAPILLARY: 154 mg/dL — AB (ref 65–99)
GLUCOSE-CAPILLARY: 116 mg/dL — AB (ref 65–99)
GLUCOSE-CAPILLARY: 139 mg/dL — AB (ref 65–99)

## 2017-01-23 MED ORDER — ASPIRIN 81 MG PO TBEC: 81.0000 mg | DELAYED_RELEASE_TABLET | Freq: Every day | ORAL | Status: AC

## 2017-01-23 MED ORDER — ACETAMINOPHEN 325 MG PO TABS: 650.0000 mg | ORAL_TABLET | Freq: Four times a day (QID) | ORAL | Status: DC | PRN

## 2017-01-23 MED ORDER — ROSUVASTATIN CALCIUM 20 MG PO TABS
20.0000 mg | ORAL_TABLET | Freq: Every day | ORAL | Status: DC
Start: 2017-01-23 — End: 2017-08-29

## 2017-01-23 MED ORDER — CLONIDINE HCL 0.1 MG PO TABS: 0.1000 mg | ORAL_TABLET | Freq: Two times a day (BID) | ORAL | 4 refills | Status: AC

## 2017-01-23 MED ORDER — HALOPERIDOL 1 MG PO TABS: 1.0000 mg | ORAL_TABLET | Freq: Three times a day (TID) | ORAL | 2 refills | Status: DC | PRN

## 2017-01-23 MED ORDER — LORAZEPAM 0.5 MG PO TABS: 0.5000 mg | ORAL_TABLET | Freq: Three times a day (TID) | ORAL | 0 refills | Status: DC | PRN

## 2017-01-23 NOTE — Progress Notes (Signed)
Patient left unit with PTAR by stretcher.  Family with patient.

## 2017-01-23 NOTE — Discharge Instructions (Signed)

## 2017-01-23 NOTE — Progress Notes (Signed)
Attempt call report to [facility]. [phone]

## 2017-01-23 NOTE — Progress Notes (Signed)
Discharge to: [place] Anticipated discharge date: 01/23/17 Family notified: Yes, by phone Transportation by: PTAR  Report #: [id number], [place] signing off.  Laveda Abbe LCSW [phone]

## 2017-01-23 NOTE — Progress Notes (Signed)
  Speech Language Pathology Treatment: Dysphagia  Patient Details Name: Thomas [name] MRN: [id number] DOB: [birth year] Today's Date: 01/23/2017 Time: 1856-3149 SLP Time Calculation (min) (ACUTE ONLY): 8 min  Assessment / Plan / Recommendation Clinical Impression  Pt verbally recalled his need for a chin tuck during swallowing, and then demonstrated his use with Dys 3 textures and nectar thick liquids with Mod I. He did have one delayed cough, observed several minutes after PO intake had stopped, but vocal quality remained clear throughout session. SLP provided education about rationale for modified diet and swallowing strategies, particularly in light of silent aspiration on MBS. Recommend continued SLP f/u at next level of care.   HPI HPI: Pt is an 83 y.o.maleadmitted from CIR due to AMS and worsening dysarthria. MRI showed a new 12 mm acute ischemic infarct at the lateral R midbrain/R corticospinal tract with punctate 3 mm R thalamic infarct. Pt had been non CIR for prior R cerebellar and brainstem infarcts. MBS completed on 5/26 recommended Dys 3 diet and nectar thick liquids due to sensed aspiration of thin liquids. He was later advanced to thin liquids clinically. Repeat MBS after recent acute neuro event showed worsening swallow, with silent aspiration of nectars noted, requiring chin tuck to protect airway. PMH includes: gastritis, prostate cancer, HTN, HH, esophagitis, esophageal stricture, ED, malignant mesothelioma      SLP Plan  Continue with current plan of care       Recommendations  Diet recommendations: Dysphagia 3 (mechanical soft);Nectar-thick liquid Liquids provided via: Cup;No straw Medication Administration: Crushed with puree Supervision: Patient able to self feed;Full supervision/cueing for compensatory strategies Compensations: Minimize environmental distractions;Slow rate;Chin tuck Postural Changes and/or Swallow Maneuvers: Seated upright 90 degrees;Chin tuck                Oral Care Recommendations: Oral care BID Follow up Recommendations: Skilled Nursing facility SLP Visit Diagnosis: Dysphagia, oropharyngeal phase (R13.12) Plan: Continue with current plan of care       GO                Germain Osgood 01/23/2017, 3:42 PM  Germain Osgood, M.A. CCC-SLP [phone]

## 2017-01-23 NOTE — Care Management Note (Signed)
Case Management Note  Patient Details  Name: Thomas [name] MRN: [id number] Date of Birth: May 24, 1926  Subjective/Objective:                    Action/Plan: Pt discharging to [facility] today. No further needs per CM.   Expected Discharge Date:  01/23/17               Expected Discharge Plan:  Skilled Nursing Facility  In-House Referral:  Clinical Social Work  Discharge planning Services     Post Acute Care Choice:    Choice offered to:     DME Arranged:    DME Agency:     HH Arranged:    [place] Agency:     Status of Service:  Completed, signed off  If discussed at [employer], dates discussed:    Additional Comments:  Pollie Friar, RN 01/23/2017, 2:17 PM

## 2017-01-23 NOTE — Clinical Social Work Note (Signed)
Clinical Social Work Assessment  Patient Details  Name: Thomas [name] MRN: [id number] Date of Birth: 16-Feb-1926  Date of referral:  01/23/17               Reason for consult:  Facility Placement, Discharge Planning                Permission sought to share information with:  Facility Sport and exercise psychologist, Family Supports Permission granted to share information::  Yes, Verbal Permission Granted  Name::     Horald Chestnut  Agency::  SNF  Relationship::  Daughters  Contact Information:     Housing/Transportation Living arrangements for the past 2 months:  Single Family Home Source of Information:  Adult Children Patient Interpreter Needed:  None Criminal Activity/Legal Involvement Pertinent to Current Situation/Hospitalization:  No - Comment as needed Significant Relationships:  Adult Children, Spouse Lives with:  Self, Significant Other Do you feel safe going back to the place where you live?  Yes Need for family participation in patient care:  Yes (Comment) (patient disoriented and confused)  Care giving concerns:  Patient lives home with spouse, and spouse has also been having some health issues and recently had a fall, so there are concerns about ability to safely care for daily needs at home. Patient had been in [place], but may need a longer stay in SNF to recover before going home.   Social Worker assessment / plan:  CSW engaged in discussion with patient's daughter, Freda Munro, to discuss the plan. Patient's daughter confirmed that the patient will not be returning to CIR, and family is interested in looking at other options. Patient's daughter discussed comfort with [place] just because she works there, but that the patient's other children want to be comfortable with the decision, too. Patient's daughter explained how the family is interviewing nurse aides this morning for when the patient can go home as well as touring other facilities for rehab placement. CSW explained process for  obtaining insurance authorization, and that facility needs to be selected prior to starting auth for SNF. Patient's daughter agreed to let CSW know as soon as a facility decision has been made so that auth can begin for placement. CSW indicated that doctor was planning discharge today, and patient's daughter acknowledged understanding. CSW will follow to facilitate discharge.  Employment status:  Retired Nurse, adult PT Recommendations:  Inpatient [place] / Referral to community resources:  [place]  Patient/Family's Response to care:  Patient's response unable to be assessed due to disorientation. Patient's daughter agreeable to SNF placement.  Patient/Family's Understanding of and Emotional Response to Diagnosis, Current Treatment, and Prognosis:  Patient's daughter expressed frustration at the discharge planning process and trying to coordinate the family with making a decision on facility placement. Patient's daughter discussed how this was overwhelming because it was her dad and she wanted the best care possible for him, and to make the best decision for what he needs. Patient's daughter discussed that the family ultimately wants him to be able to come home, so they are trying to manage how best to make that happen. Patient's daughter indicated understanding of CSW role in discharge planning.  Emotional Assessment Appearance:  Appears stated age Attitude/Demeanor/Rapport:  Unable to Assess Affect (typically observed):  Unable to Assess Orientation:  Oriented to Self, Oriented to Situation Alcohol / Substance use:  Not Applicable Psych involvement (Current and /or in the community):  No (Comment)  Discharge Needs  Concerns to be addressed:  Care Coordination, Discharge Planning Concerns Readmission within the last 30 days:  Yes Current discharge risk:  Physical Impairment, Cognitively Impaired Barriers to Discharge:  Continued  Medical Work up   [employer], [place] 01/23/2017, 10:24 AM

## 2017-01-23 NOTE — Progress Notes (Signed)
CSW confirmed with patient's daughter, Freda Munro, that the family has definitely decided on [facility] for SNF placement. CSW has initiated [place] with [employer].   CSW following to facilitate discharge needs.  Laveda Abbe LCSW [phone]

## 2017-01-23 NOTE — Progress Notes (Addendum)
Physical Therapy Treatment Patient Details Name: Thomas [name] MRN: [id number] DOB: 1925/10/04 Today's Date: 01/23/2017    History of Present Illness  Pt is an 83 y.o. male admitted from CIR due to AMS and worsening dysarthria. MRI showed a new 12 mm acute ischemic infarct at the lateral R midbrain/R corticospinal tract with punctate 3 mm R thalamic infarct. Pt had been on CIR for prior R cerebellar and brainstem infarcts. PMH includes: gastritis, prostate cancer, HTN, HH, esophagitis, esophageal stricture, ED, malignant mesothelioma    PT Comments    Pt making good improvement.  R side incoordination limiting quality of gait and pt ability to maneuver the RW.  Pt also having trouble coordinating the standing exercises, with R LE mildly buckling with any such tasks.   Follow Up Recommendations  SNF     Equipment Recommendations  None recommended by PT    Recommendations for Other Services       Precautions / Restrictions Precautions Precautions: Fall    Mobility  Bed Mobility               General bed mobility comments: pt OOB in chair  Transfers Overall transfer level: Needs assistance Equipment used: Rolling walker (2 wheeled);None Transfers: Sit to/from Stand Sit to Stand: Min assist         General transfer comment: cues for hand placement, stability assist to help pt control for R side incoordination  Ambulation/Gait Ambulation/Gait assistance: Min assist Ambulation Distance (Feet): 100 Feet (x2) Assistive device: Rolling walker (2 wheeled) Gait Pattern/deviations: Step-through pattern Gait velocity: Decreased Gait velocity interpretation: Below normal speed for age/gender General Gait Details: R LE incoordination or knee and toe off to heel strike.  Also pt elevating shoulder with scapula not in an efficient position.  Pt having some trouble controlling the direction of the RW.  Notable for fatigue with coordination degrading.   Stairs             Wheelchair Mobility    Modified Rankin (Stroke Patients Only) Modified Rankin (Stroke Patients Only) Modified Rankin: Moderately severe disability     Balance Overall balance assessment: Needs assistance Sitting-balance support: No upper extremity supported;Single extremity supported Sitting balance-Leahy Scale: Fair     Standing balance support: Single extremity supported;Bilateral upper extremity supported;During functional activity Standing balance-Leahy Scale: Poor Standing balance comment: R bias                            Cognition Arousal/Alertness: Awake/alert Behavior During Therapy: WFL for tasks assessed/performed Overall Cognitive Status: Impaired/Different from baseline (but functional) Area of Impairment: Orientation;Attention;Memory;Following commands;Safety/judgement;Awareness;Problem solving                   Current Attention Level: Selective Memory: Decreased short-term memory Following Commands: Follows one step commands consistently Safety/Judgement: Decreased awareness of deficits;Decreased awareness of safety Awareness: Emergent Problem Solving: Slow processing;Difficulty sequencing        Exercises General Exercises - Lower Extremity Hip ABduction/ADduction: AROM;Strengthening;Both;10 reps;Other (comment);Standing (R vs L LE coordination problems) Hip Flexion/Marching: AROM;Strengthening;Both;10 reps;Standing Heel Raises: AROM;Strengthening;Both;10 reps;Standing (R knee coordination issues) Mini-Sqauts: AROM;Strengthening;Both;10 reps;Standing    General Comments        Pertinent Vitals/Pain Pain Assessment: No/denies pain    Home Living                      Prior Function            PT Goals (current  goals can now be found in the care plan section) Acute Rehab PT Goals Patient Stated Goal: to get back to rehab PT Goal Formulation: With patient Time For Goal Achievement: 02/03/17 Potential to Achieve  Goals: Fair Progress towards PT goals: Progressing toward goals    Frequency    Min 3X/week      PT Plan Discharge plan needs to be updated;Frequency needs to be updated    Co-evaluation              AM-PAC PT "6 Clicks" Daily Activity  Outcome Measure  Difficulty turning over in bed (including adjusting bedclothes, sheets and blankets)?: Total Difficulty moving from lying on back to sitting on the side of the bed? : Total Difficulty sitting down on and standing up from a chair with arms (e.g., wheelchair, bedside commode, etc,.)?: A Little Help needed moving to and from a bed to chair (including a wheelchair)?: A Little Help needed walking in hospital room?: A Little Help needed climbing 3-5 steps with a railing? : A Lot 6 Click Score: 13    End of Session   Activity Tolerance: Patient tolerated treatment well Patient left: in chair;with call bell/phone within reach;with chair alarm set Nurse Communication: Mobility status PT Visit Diagnosis: Unsteadiness on feet (R26.81);Other symptoms and signs involving the nervous system (R29.898)     Time: 8099-8338 PT Time Calculation (min) (ACUTE ONLY): 18 min  Charges:  $Gait Training: 8-22 mins                    G Codes:       02/10/17  Donnella Sham, PT [phone]  (pager)   Tessie Fass Hatcher [place] Feb 10, 2017, 10:47 AM

## 2017-01-23 NOTE — Clinical Social Work Placement (Signed)
   CLINICAL SOCIAL WORK PLACEMENT  NOTE  Date:  01/23/2017  Patient Details  Name: Melquiades Kovar MRN: [id number] Date of Birth: 23-Sep-1925  Clinical Social Work is seeking post-discharge placement for this patient at the [place] level of care (*CSW will initial, date and re-position this form in  chart as items are completed):      Patient/family provided with [place] Work Department's list of facilities offering this level of care within the geographic area requested by the patient (or if unable, by the patient's family).      Patient/family informed of their freedom to choose among providers that offer the needed level of care, that participate in Medicare, Medicaid or managed care program needed by the patient, have an available bed and are willing to accept the patient.      Patient/family informed of [facility]'s ownership interest in [facility] and [facility], as well as of the fact that they are under no obligation to receive care at these facilities.  PASRR submitted to EDS on       PASRR number received on       Existing PASRR number confirmed on       FL2 transmitted to all facilities in geographic area requested by pt/family on       FL2 transmitted to all facilities within larger geographic area on 01/22/17     Patient informed that his/her managed care company has contracts with or will negotiate with certain facilities, including the following:        Yes   Patient/family informed of bed offers received.  Patient chooses bed at [facility]     Physician recommends and patient chooses bed at      Patient to be transferred to [facility] on 01/23/17.  Patient to be transferred to facility by PTAR     Patient family notified on 01/23/17 of transfer.  Name of family member notified:  Freda Munro     PHYSICIAN       Additional Comment:    _______________________________________________ Geralynn Ochs,  LCSW 01/23/2017, 4:46 PM

## 2017-01-23 NOTE — Discharge Summary (Addendum)
Physician Discharge Summary  Thomas [name] EHU:[id number] DOB: 1925/12/22 DOA: 01/18/2017  PCP: Marin Olp, MD  Admit date: 01/18/2017 Discharge date: 01/23/2017  Disposition: SNF  Recommendations for Outpatient Follow-up:  1. Follow up with PCP in 2 weeks 2. Follow up with neurology stroke clinic in 6 weeks 3. Follow up with cardiology and pulmonology as already scheduled 4. Please obtain BMP/CBC in 1-2 week 5. Please work to improve blood pressure over next 7-10 days by titrating blood pressure medications as appropriate  Long-term BP goal 130-150 due to intracranial stenosis 6. Monitor blood sugars 3 times per day and treat as needed for good glycemic control.   Discharge Condition: STABLE   CODE STATUS: DNR   Diet recommendation: Dysphagia 3 (Mech soft) solids; Nectar thick liquid  Whole meds with puree  Chief Complaint: AMS Brief/Interim Summary:  Brief Admission Hx: Thomas Johnsonis a 83 y.o.malepast history significant for esophageal stricture, hypertension, prostate cancer and stroke presents as a direct admission from inpatient rehabilitation for responsiveness. Patient had been an inpatient rehabilitation since 5/29. Patient was actively being treated for UTI with oral Bactrim. In the early afternoon patient was found to be unresponsive. With some stimulation he responded but not appropriately. There was concern for possible stroke the patient was sent to CT. CT scan of the head was negative for new acute stroke. Cardiology was consult by the rehabilitation attending. Decision was made that patient directly admitted back to the acute care hospital setting for evaluation of his unresponsiveness and what appeared to be atrial flutter on EKG.  MDM/Assessment & Plan: Acute CVA with encephalopathy New R midbrain and R thalamicinfarcts felt to be due to severe VA, BA and PCA stenosis. Acute delirium - added lorazepam and haldol prn and with good results.  Suspect he has  underlying vascular dementia.   MRI New 12 mm acute ischemic nonhemorrhagic infarct at the lateral right midbrain/right corticospinal tract, with punctate 3 mm right thalamic infarct. TSH WNL Neurology consult appreciated, stroke team following.  Added aspirin 81 mg daily to plavix for stroke prevention. Pt had a repeat MRI 6/11, but no new findings reported.   Dysarthria/Dysphagia Speech-language pathology consult recommending  Dys 3 diet nectar thick liquids Aspiration Precautions  Hypertension Allowed permissive hypertension in hospital but recommend to gradually normalize BP in 7 days  Atrial fibrillation Eliquis 5 mg bid for stroke prevention  Diabetes SSInsulin before meals &at bedtime Carbohydrate modified diet recommended  CBG (last 3)   Recent Labs  01/22/17 1653 01/23/17 0824 01/23/17 1139  GLUCAP 126* 154* 116*   Hyperlipidemia Continue statin therapy, increased Crestor to 20 mg daily   UTI Treated fully with bactrim. repeat UA Within normal limits, completed bactrim.   Code Status:DNR DVT Prophylaxis: SCD Family Communication:family meeting 6/11 Disposition Plan:SNF   Consultants:  Neuro  cardiology  Neurology Documentation  HPI: Thomas [name] is a 83 y.o. male  past history significant for esophageal stricture, hypertension, prostate cancer and stroke presents as a direct admission from inpatient rehabilitation for responsiveness. Patient had been an inpatient rehabilitation since 5/29. Patient was actively being treated for UTI with oral Bactrim. In the early afternoon patient was found to be unresponsive. With some stimulation he responded but not appropriately. There was concern for possible stroke the patient was sent to CT. CT scan of the head was negative for new acute stroke. Cardiology was consult by the rehabilitation attending. Decision was made that patient directly admitted back to the acute care hospital setting for  evaluation  of his unresponsiveness and what appeared to be atrial flutter on EKG.  Ct Head Wo Contrast 01/18/2017 1. Subacute right cerebellar infarct, stable in appearance and not associated with hemorrhage. 2. Remote bilateral occipital infarcts. 3. Significant periventricular white matter changes and atrophy. 4.  No evidence for acute intracranial abnormality. 5. Bilateral mastoid effusions.   Mr Brain Wo Contrast 01/19/2017 1. New 12 mm acute ischemic nonhemorrhagic infarct at the lateral right midbrain/right corticospinal tract, with punctate 3 mm right thalamic infarct. 2. Normal expected interval evolution of subacute right cerebellar and brainstem infarcts. 3. Otherwise stable appearance of the brain with remote bilateral occipital lobe, left frontal, left parietal lobe infarcts. Moderate to severe chronic small vessel ischemic disease.   Dg Chest Port 1 View 01/19/2017 Opacification of left hemithorax stable from the prior study.   01/18/2017 Stable appearance of the left hemithorax. No evidence for acute abnormality of the right lung. Retained contrast following previous barium study 12 days ago. Consider KUB for further evaluation.   Cranial Nerves II - XII - II - Visual field intact OU, however, decreased visual acuity. III, IV, VI - Extraocular movements intact. V - Facial sensation intact bilaterally. VII - left nasolabial fold flattening. VIII - Hearing & vestibular intact bilaterally. X - Palate elevates symmetrically. XI - Chin turning & shoulder shrug intact bilaterally. XII - Tongue protrusion intact.  Motor Strength - The patient's strength was normal in all extremities except left foot DF 4-/5 and pronator drift was absent.  Bulk was normal and fasciculations were absent.   Motor Tone - Muscle tone was assessed at the neck and appendages and was normal.  Reflexes - The patient's reflexes were 1+ in all extremities and he had no pathological reflexes.  Sensory - Light  touch, temperature/pinprick were assessed and were symmetrical.    Coordination - The patient had mild dysmetria in both hands with FTN test.  Tremor was absent.  Gait and Station - deferred.  [name] is a 83 y.o. male with history of esophageal stricture, hypertension, prostate cancer and recent stroke presenting from IP rehab with altered mental status on CIR alone with UTI on Bactrim. He did not receive IV t-PA.   Stroke:   New R midbrain and R thalamic infarcts felt to be due to severe VA, BA and PCA stenosis.  Resultant  Left facial droop and left foot weakness  CT head subacute R cerebellar infarct. Old B occipital infarcts. Periventricular white matter disease. Atrophy.  MRI head R midbrain and R thalamic infarct. Interval evolution subacute R cerebellar and brainstem infarcts. Mod to severe small vessel disease.   Eliquis  for VTE prophylaxis  Diet NPO time specified  Eliquis (apixaban) daily prior to admission, now on Eliquis 5mg  bid. Recommend to add ASA 81mg  daily for further stroke prevention.   Patient counseled to be compliant with his antithrombotic medications  Ongoing aggressive stroke risk factor management  Therapy recommendations:  CIR  Disposition:  SNF [place]  Atrial Fibrillation  Home anticoagulation:  Eliquis (apixaban) daily   had been changed to aspirin at time of stroke in May d/t size of infarct and bleeding risk. Restarted after 2 weeks.   Continue eliquis 5mg  bid for stroke prevention  Posterior vascular stenosis  MRA and CTA showed right VA, BA, b/l PCA severe stenosis. Left VA ends up at PICA  Pt new stroke at midbrain and left thalamus likely due to posterior stenosis with orthostatic hypotension  Current orthostatic  vital unremarkable  Recommend addition of aspirin 81 to eliquis for stroke prevention   Hypertension  Stable              Permissive hypertension (OK if < 220/120) but gradually normalize in 5-7  days              Long-term BP goal 130-150 due to intracranial stenosis              Avoid hypotension and keep hydration              Pt needs to check BP at home  Hyperlipidemia  Home meds:  crestor 10, resumed in hospital  LDL 85 in May, goal < 70  Continue statin at discharge  Diabetes type II  HgbA1c 6.9 in May, at goal < 7.0  Under control  SSI  Other Stroke Risk Factors  Advanced age  Former Cigarette smoker  ETOH use, advised to drink no more than 2 drink(s) a day  Hx stroke/TIA  01/05/2017 - Stroke due to embolism of PICA d/t AF  12/13/16 - b/l occipital acute on chronic infarcts.   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 Leonie Man at [facility] in about 2 months. Thanks.   Increase activity slowly    Complete by:  As directed      Allergies as of 01/23/2017      Reactions   Ace Inhibitors Hives   Penicillins Swelling   Swelling  of the face. Has patient had a PCN reaction causing immediate rash, facial/tongue/throat swelling, SOB or lightheadedness with hypotension: YES Has patient had a PCN reaction causing severe rash involving mucus membranes or skin necrosis: NO Has patient had a PCN reaction that required hospitalizationNO Has patient had a PCN reaction occurring within the last 10 years: NO If all of the above answers are "NO", then may proceed with Cephalosporin use.      Medication List    TAKE these medications    acetaminophen 325 MG tablet Commonly known as:  TYLENOL Take 2 tablets (650 mg total) by mouth every 6 (six) hours as needed for mild pain, moderate pain or fever (or Fever >/= 101).   amLODipine 5 MG tablet Commonly known as:  NORVASC Take 1 tablet (5 mg total) by mouth daily.   aspirin 81 MG EC tablet Take 1 tablet (81 mg total) by mouth daily. Start taking on:  01/24/2017   cloNIDine 0.1 MG tablet Commonly known as:  CATAPRES Take 1 tablet (0.1 mg total) by mouth 2 (two) times daily. What changed:  See the new instructions.   ELIQUIS 5 MG Tabs tablet Generic drug:  apixaban Take 5 mg by mouth 2 (two) times daily.   haloperidol 1 MG tablet Commonly known as:  HALDOL Take 1 tablet (1 mg total) by mouth every 8 (eight) hours as needed for agitation.   LORazepam 0.5 MG tablet Commonly known as:  ATIVAN Take 1 tablet (0.5 mg total) by mouth every 8 (eight)  hours as needed for anxiety, sedation or sleep.   metoCLOPramide 5 MG tablet Commonly known as:  REGLAN Take 1 tablet (5 mg total) by mouth every 8 (eight) hours as needed for nausea.   rosuvastatin 20 MG tablet Commonly known as:  CRESTOR Take 1 tablet (20 mg total) by mouth daily. What changed:  medication strength  how much to take      Follow-up Information    Garvin Fila, MD. Schedule an appointment as soon as possible for a visit in 6 week(s).   Specialties:  Neurology, Radiology Contact information: [address] [place] [address] [phone]        Marin Olp, MD. Schedule an appointment as soon as possible for a visit in 2 week(s).   Specialty:  Family Medicine Why:  Hospital Follow Up  Contact information: [address] [place] [address] [phone]          Allergies  Allergen Reactions  . Ace Inhibitors Hives  . Penicillins Swelling    Swelling  of the face. Has patient had a PCN reaction causing immediate rash, facial/tongue/throat swelling,  SOB or lightheadedness with hypotension: YES Has patient had a PCN reaction causing severe rash involving mucus membranes or skin necrosis: NO Has patient had a PCN reaction that required hospitalizationNO Has patient had a PCN reaction occurring within the last 10 years: NO If all of the above answers are "NO", then may proceed with Cephalosporin use.     Procedures/Studies: Ct Angio Head W Or Wo Contrast  Result Date: 01/05/2017 CLINICAL DATA:  Cerebellar infarct. EXAM: CT ANGIOGRAPHY HEAD AND NECK TECHNIQUE: Multidetector CT imaging of the head and neck was performed using the standard protocol during bolus administration of intravenous contrast. Multiplanar CT image reconstructions and MIPs were obtained to evaluate the vascular anatomy. Carotid stenosis measurements (when applicable) are obtained utilizing NASCET criteria, using the distal internal carotid diameter as the denominator. CONTRAST:  Dose currently not available.  Reference EMR. COMPARISON:  Intracranial MRA 12/14/2016 FINDINGS: CTA NECK FINDINGS Aortic arch: Advanced atheromatous wall thickening and calcification. Three vessel branching pattern. Low-density posteriorly to the proximal aortic arch is pericardial recess based on prior chest CT. Right carotid system: Bulky calcified plaque at the bifurcation without flow limiting stenosis. There is extensive atheromatous irregularity of the ICA with areas of dilatation/positive remodeling. No flow limiting stenosis. Left carotid system: Diffuse atheromatous wall thickening. The left ICA shows extensive irregular lumen throughout the tortuous ICA segment with a flap-like filling defect causing ~50% stenosis at the mid ICA segment. Vertebral arteries: No flow limiting stenosis in the proximal subclavian arteries. Advanced narrowing of the non dominant left vertebral artery at its origin and intermittently in the V2 segment. Mild atheromatous narrowing at the right vertebral with gradual  decreased flow leading to occlusion at the V3 and V4 segments. Skeleton: Advanced disc and facet degeneration. Other neck: No acute finding. Upper chest: Known large left pleural effusion with diffuse pleural thickening and lung collapse, reportedly mesothelioma. Review of the MIP images confirms the above findings CTA HEAD FINDINGS Anterior circulation: Severe atherosclerosis. Severe narrowing of the right cavernous ICA. Moderate narrowing of the right supraclinoid ICA. Advanced right and mild moderate left M1 segment stenosis. No acute branch occlusion noted. Posterior circulation: Left vertebral artery ends in PICA. The right vertebral artery has occluded since prior, with severe underlying multifocal stenoses by prior MRA. There is tenuous intermittently seen blood flow in the basilar to the level  of the terminus. Minimal flow seen in the heavily diseased bilateral posterior cerebral arteries. No notable posterior communicating arteries. Venous sinuses: Patent Anatomic variants: Incomplete circle-of-Willis at the posterior communicating arteries. Delayed phase: Not performed in the emergent setting Study reviewed in person with Dr. Leonel Ramsay. Review of the MIP images confirms the above findings IMPRESSION: 1. Interval occlusion of the right V3 and V4 segments with intermittent tenuous blood flow in the basilar and bilateral posterior cerebral arteries. There is underlying severe right V4 stenoses by prior MRA. (left prevertebral artery ends in PICA and there is no significant posterior communicating arteries). 2. Advanced intracranial atherosclerosis with high-grade right cavernous and distal M1 segment stenoses. 3. Severe atherosclerosis of the bilateral cervical carotids with left mid ICA intimal flap causing ~50% narrowing. 4. Acute to subacute right cerebellar infarct. 5. Complex left pleural effusion with history of mesothelioma. Electronically Signed   By: Monte Fantasia M.D.   On: 01/05/2017 12:55    Dg Chest 2 View  Result Date: 01/06/2017 CLINICAL DATA:  Hypertension, shortness of breath, stroke, history prostate cancer, mesothelioma LEFT lung, GERD, former smoker EXAM: CHEST  2 VIEW COMPARISON:  12/13/2016 FINDINGS: Complete opacification of the LEFT hemithorax, patient with known mesothelioma, unchanged appearance. RIGHT lung clear. No RIGHT pleural effusion or pneumothorax. Bones appear demineralized. IMPRESSION: Persistent complete opacification of the LEFT hemithorax in patient with known mesothelioma. RIGHT lung remains clear. Electronically Signed   By: Lavonia Dana M.D.   On: 01/06/2017 10:32   Ct Head Wo Contrast  Result Date: 01/18/2017 CLINICAL DATA:  Pt got lethargic and confused after therapy today. Hx of stroke. When breathing pt is moving his head. Unable to hold head still. EXAM: CT HEAD WITHOUT CONTRAST TECHNIQUE: Contiguous axial images were obtained from the base of the skull through the vertex without intravenous contrast. COMPARISON:  MRI 01/06/2017 and earlier studies FINDINGS: Brain: There is central and cortical atrophy. Periventricular white matter changes are consistent with small vessel disease. Subacute right cerebellar infarct is again noted. Right pontine infarct is better seen on recent MRI exam. Remote bilateral occipital lobe infarcts appear stable. No interval changes to indicate presence of acute infarct. There is no intra or extra-axial fluid collection or mass. Vascular: There is atherosclerotic calcification of the carotid siphons. Skull: Normal. Negative for fracture or focal lesion. Sinuses/Orbits: Bilateral mastoid effusions are present. Unremarkable appearance of the orbits and paranasal sinuses. Other: Study quality is degraded by patient motion artifact. IMPRESSION: 1. Subacute right cerebellar infarct, stable in appearance and not associated with hemorrhage. 2. Remote bilateral occipital infarcts. 3. Significant periventricular white matter changes and  atrophy. 4.  No evidence for acute intracranial abnormality. 5. Bilateral mastoid effusions. Electronically Signed   By: Nolon Nations M.D.   On: 01/18/2017 12:35   Ct Angio Neck W Or Wo Contrast  Result Date: 01/05/2017 CLINICAL DATA:  Cerebellar infarct. EXAM: CT ANGIOGRAPHY HEAD AND NECK TECHNIQUE: Multidetector CT imaging of the head and neck was performed using the standard protocol during bolus administration of intravenous contrast. Multiplanar CT image reconstructions and MIPs were obtained to evaluate the vascular anatomy. Carotid stenosis measurements (when applicable) are obtained utilizing NASCET criteria, using the distal internal carotid diameter as the denominator. CONTRAST:  Dose currently not available.  Reference EMR. COMPARISON:  Intracranial MRA 12/14/2016 FINDINGS: CTA NECK FINDINGS Aortic arch: Advanced atheromatous wall thickening and calcification. Three vessel branching pattern. Low-density posteriorly to the proximal aortic arch is pericardial recess based on prior chest CT. Right carotid  system: Bulky calcified plaque at the bifurcation without flow limiting stenosis. There is extensive atheromatous irregularity of the ICA with areas of dilatation/positive remodeling. No flow limiting stenosis. Left carotid system: Diffuse atheromatous wall thickening. The left ICA shows extensive irregular lumen throughout the tortuous ICA segment with a flap-like filling defect causing ~50% stenosis at the mid ICA segment. Vertebral arteries: No flow limiting stenosis in the proximal subclavian arteries. Advanced narrowing of the non dominant left vertebral artery at its origin and intermittently in the V2 segment. Mild atheromatous narrowing at the right vertebral with gradual decreased flow leading to occlusion at the V3 and V4 segments. Skeleton: Advanced disc and facet degeneration. Other neck: No acute finding. Upper chest: Known large left pleural effusion with diffuse pleural thickening and  lung collapse, reportedly mesothelioma. Review of the MIP images confirms the above findings CTA HEAD FINDINGS Anterior circulation: Severe atherosclerosis. Severe narrowing of the right cavernous ICA. Moderate narrowing of the right supraclinoid ICA. Advanced right and mild moderate left M1 segment stenosis. No acute branch occlusion noted. Posterior circulation: Left vertebral artery ends in PICA. The right vertebral artery has occluded since prior, with severe underlying multifocal stenoses by prior MRA. There is tenuous intermittently seen blood flow in the basilar to the level of the terminus. Minimal flow seen in the heavily diseased bilateral posterior cerebral arteries. No notable posterior communicating arteries. Venous sinuses: Patent Anatomic variants: Incomplete circle-of-Willis at the posterior communicating arteries. Delayed phase: Not performed in the emergent setting Study reviewed in person with Dr. Leonel Ramsay. Review of the MIP images confirms the above findings IMPRESSION: 1. Interval occlusion of the right V3 and V4 segments with intermittent tenuous blood flow in the basilar and bilateral posterior cerebral arteries. There is underlying severe right V4 stenoses by prior MRA. (left prevertebral artery ends in PICA and there is no significant posterior communicating arteries). 2. Advanced intracranial atherosclerosis with high-grade right cavernous and distal M1 segment stenoses. 3. Severe atherosclerosis of the bilateral cervical carotids with left mid ICA intimal flap causing ~50% narrowing. 4. Acute to subacute right cerebellar infarct. 5. Complex left pleural effusion with history of mesothelioma. Electronically Signed   By: Monte Fantasia M.D.   On: 01/05/2017 12:55   Mr Brain Wo Contrast  Result Date: 01/22/2017 CLINICAL DATA:  Altered mental status. Weakness. Dysarthria. Right mid brain infarction and right thalamic infarction. EXAM: MRI HEAD WITHOUT CONTRAST TECHNIQUE: Multiplanar,  multiecho pulse sequences of the brain and surrounding structures were obtained without intravenous contrast. COMPARISON:  01/18/2017.  01/06/2017. FINDINGS: Brain: Diffusion imaging does not show any newly seen insult since the study of 4 days ago. Expected evolutionary changes of recent right cerebellar, right brainstem, right midbrain, right thalamus and right occipital infarctions. Older infarctions within both occipital lobes appear the same. Chronic small-vessel ischemic changes throughout the deep and subcortical white matter appear the same. No evidence of hemorrhage, mass lesion, hydrocephalus or extra-axial collection. Vascular: Major vessels at the base of the brain show flow. Skull and upper cervical spine: Negative Sinuses/Orbits: Clear/ normal. Small amount of fluid in the mastoid air cells. Other: None significant IMPRESSION: No new insult since the previous study. Expected evolutionary changes continue in the region of infarction in the right cerebellum, right brainstem, right midbrain, right thalamus and right occipital lobe. Older ischemic changes elsewhere appear the same. Electronically Signed   By: Nelson Chimes M.D.   On: 01/22/2017 09:33   Mr Brain Wo Contrast  Result Date: 01/19/2017 CLINICAL DATA:  Initial  evaluation for acute stroke. EXAM: MRI HEAD WITHOUT CONTRAST TECHNIQUE: Multiplanar, multiecho pulse sequences of the brain and surrounding structures were obtained without intravenous contrast. COMPARISON:  Prior CT from earlier same day as well is recent MRI from 01/06/2017. FINDINGS: Brain: Diffuse prominence of the CSF containing spaces compatible with generalized cerebral atrophy. Patchy and confluent T2/FLAIR hyperintensity within the periventricular and deep white matter both cerebral hemispheres most compatible chronic small vessel ischemic disease, moderate to severe in nature. There has been interval evolution of recently identified right cerebellar and pontine infarcts, now a  late subacute in appearance. No significant mass effect. Few scattered punctate foci of petechial hemorrhage again noted. Patchy involvement of the right middle lung noted. There is a new 12 mm focus of restricted diffusion involving the lateral right midbrain at the level the right cortical spinal tract (series 4, image 21). Additional punctate through on the my or focus of diffusion within the right thalamus (series 4, image 26). These are new from prior. No associated hemorrhage or mass effect. No other evidence for acute ischemia. Remote bilateral occipital lobe infarcts again noted. Smaller remote left frontal and parietal lobe infarcts noted as well. No evidence for acute intracranial hemorrhage. No mass lesion, midline shift or mass effect. No hydrocephalus. No extra-axial fluid collection. Vascular: Major intracranial vascular flow voids are stable in appearance with patchy flow void in the vertebrobasilar system, better evaluated on recent CTA. Skull and upper cervical spine: Craniocervical junction within normal limits. Upper cervical spine unremarkable. Bone marrow signal intensity normal. No scalp soft tissue abnormality. Sinuses/Orbits: Globes and orbital soft tissues within normal limits. Patient status post lens extraction bilaterally. Paranasal sinuses are clear. Small bilateral mastoid effusions, of doubtful significance. Inner ear structures grossly normal. IMPRESSION: 1. New 12 mm acute ischemic nonhemorrhagic infarct at the lateral right midbrain/right corticospinal tract, with punctate 3 mm right thalamic infarct. 2. Normal expected interval evolution of subacute right cerebellar and brainstem infarcts. 3. Otherwise stable appearance of the brain with remote bilateral occipital lobe, left frontal, left parietal lobe infarcts. Moderate to severe chronic small vessel ischemic disease. Electronically Signed   By: Jeannine Boga M.D.   On: 01/19/2017 00:48   Mr Brain Wo Contrast  Result  Date: 01/06/2017 CLINICAL DATA:  Two days of worsening dizziness, facial weakness and dysarthria, worsening at 11 a.m. today. On hospice for malignant mesothelioma. Follow-up occipital stroke. EXAM: MRI HEAD WITHOUT CONTRAST TECHNIQUE: Multiplanar, multiecho pulse sequences of the brain and surrounding structures were obtained without intravenous contrast. COMPARISON:  CT angiogram of the head and neck Jan 05, 2017 and MRI of the head Dec 13, 2016 FINDINGS: BRAIN: Confluent reduced diffusion RIGHT inferior cerebellum with low ADC values. Additional scattered subcentimeter foci of reduced diffusion RIGHT pons, RIGHT superior cerebellum. Residual punctate reduced diffusion RIGHT mesial occipital lobe. Faint susceptibility artifact RIGHT cerebellum compatible with petechial hemorrhage. Susceptibility artifact again seen along the RIGHT vertebral artery consistent with known occlusion. Regional mass effect/cytotoxic edema RIGHT inferior cerebellum, RIGHT inferior cerebral peduncle. Bioccipital lobe encephalomalacia again noted. Small area LEFT frontal LEFT parietal lobe encephalomalacia. Ventricles and sulci are overall normal for patient's age. Confluent supratentorial white matter FLAIR T2 hyperintensities. No midline shift or masses. No abnormal extra-axial fluid collections. VASCULAR: Attenuated RIGHT vertebral artery flow void. Maintained anterior circulation. SKULL AND UPPER CERVICAL SPINE: No abnormal sellar expansion. No suspicious calvarial bone marrow signal. Craniocervical junction maintained. SINUSES/ORBITS: Small RIGHT maxillary mucosal retention cyst. Small bilateral mastoid effusions. The included ocular globes and  orbital contents are non-suspicious. Status post bilateral ocular lens implants. OTHER: Patient is edentulous. IMPRESSION: Acute RIGHT cerebellar and pontine infarcts (including RIGHT PICA), with petechial hemorrhage. Otherwise stable appearance including old bilateral occipital lobe, LEFT  frontal and LEFT parietal lobe infarcts, moderate to severe chronic small vessel ischemic disease. Acute findings text paged to [place], Neurology via [employer] secure system on 01/06/2017 at 10:16 pm. Electronically Signed   By: Elon Alas M.D.   On: 01/06/2017 22:18   Dg Chest Port 1 View  Result Date: 01/19/2017 CLINICAL DATA:  Aspiration EXAM: PORTABLE CHEST 1 VIEW COMPARISON:  01/18/2017 FINDINGS: Cardiac shadow is stable. Opacification of left hemithorax is again noted and stable. The right lung is well aerated without focal infiltrate or sizable effusion. No acute bony abnormality is seen. IMPRESSION: Opacification of left hemithorax stable from the prior study. Electronically Signed   By: Inez Catalina M.D.   On: 01/19/2017 07:18   Portable Chest 1 View  Result Date: 01/18/2017 CLINICAL DATA:  Acute encephalopathy. Hx: mesothelioma; no chest complaints EXAM: PORTABLE CHEST 1 VIEW COMPARISON:  01/06/2017 and 11/14/1998 FINDINGS: Stable near complete opacification of the left hemithorax with only a minimally aerated left upper lobe. The right lung is clear. Contrast is identified within the transverse colon, presumably following barium swallow on 01/06/2017. IMPRESSION: Stable appearance of the left hemithorax. No evidence for acute abnormality of the right lung. Retained contrast following previous barium study 12 days ago. Consider KUB for further evaluation. Electronically Signed   By: Nolon Nations M.D.   On: 01/18/2017 19:45   Dg Swallowing Func-speech Pathology  Result Date: 01/19/2017 Objective Swallowing Evaluation: Type of Study: MBS-Modified Barium Swallow Study Patient Details Name: Thomas [name] MRN: [id number] Date of Birth: [birth year] Today's Date: 01/19/2017 Time: SLP Start Time (ACUTE ONLY): 1019-SLP Stop Time (ACUTE ONLY): 1034 SLP Time Calculation (min) (ACUTE ONLY): 15 min Past Medical History: Past Medical History: Diagnosis Date . Adenomatous colon polyp 2009 . Allergy  .  Carotid artery occlusion  . Diverticulosis of colon (without mention of hemorrhage)  . ED (erectile dysfunction)  . Esophageal stricture  . Esophagitis  . GERD (gastroesophageal reflux disease)  . Goals of care, counseling/discussion 12/23/2016 . Hiatal hernia  . Hypertension  . Prostate cancer ([place])  . Unspecified gastritis and gastroduodenitis without mention of hemorrhage  Past Surgical History: Past Surgical History: Procedure Laterality Date . ENDARTERECTOMY Left 03/05/2013  Procedure: ENDARTERECTOMY CAROTID;  Surgeon: Conrad Alto Pass, MD;  Location: [facility];  Service: Vascular;  Laterality: Left; . ESOPHAGOGASTRODUODENOSCOPY (EGD) WITH ESOPHAGEAL DILATION   . PROSTATE SURGERY    Laser surgery HPI: Pt is an 83 y.o.maleadmitted from CIR due to AMS and worsening dysarthria. MRI showed a new 12 mm acute ischemic infarct at the lateral R midbrain/R corticospinal tract with punctate 3 mm R thalamic infarct. Pt had been non CIR for prior R cerebellar and brainstem infarcts. MBS completed on 5/26 recommended Dys 3 diet and nectar thick liquids due to sensed aspiration of thin liquids. He was later advanced to thin liquids clinically. PMH includes: gastritis, prostate cancer, HTN, HH, esophagitis, esophageal stricture, ED, malignant mesothelioma Subjective: pt alert, confused Assessment / Plan / Recommendation CHL IP CLINICAL IMPRESSIONS 01/19/2017 Clinical Impression Pt has a moderate oropharyngeal dysphagia that appears to be multifactorial in nature. His oral phase is grossly similar to prior MBS on 5/26, with lingual pumping and slower transit. He has a delay in pharyngeal trigger that results in aspiration before the swallow with thin  liquids and larger volumes of nectar thick liquids, but airway compromise occurs silently now, without any spontaneous attempts to clear. Cued coughing was not effective at clearing aspirates, but various other strategies implemented by SLP were, including: bolus manipulation to tsp size  trials of nectar thick liquids, chin tuck when using nectar thick liquids, or thickeneing bolus to cup sips of honey thick liquids with a head neutral position. He had increased pharyngeal residuals, primarily at the vallecula, most obvious with liquids as compared to solids, suggestive of a possible esophageal etiology given known esophageal history. For now, recommend Dys 3 diet and nectar thick liquids with use of a chin tuck. Min cues were needed for use of a complete chin tuck during today's study, but should he have difficulty implementing this across meals in light of his cognitive difficulties, use of nectar thick liquids by tsp or honey thick liquids by cup could be considered.  SLP Visit Diagnosis Dysphagia, oropharyngeal phase (R13.12) Attention and concentration deficit following -- Frontal lobe and executive function deficit following -- Impact on safety and function Mild aspiration risk;Moderate aspiration risk   CHL IP TREATMENT RECOMMENDATION 01/19/2017 Treatment Recommendations Therapy as outlined in treatment plan below   Prognosis 01/19/2017 Prognosis for Safe Diet Advancement Good Barriers to Reach Goals Cognitive deficits Barriers/Prognosis Comment -- CHL IP DIET RECOMMENDATION 01/19/2017 SLP Diet Recommendations Dysphagia 3 (Mech soft) solids;Nectar thick liquid Liquid Administration via Cup;No straw Medication Administration Whole meds with puree Compensations Minimize environmental distractions;Slow rate;Small sips/bites;Follow solids with liquid;Chin tuck Postural Changes Seated upright at 90 degrees;Remain semi-upright after after feeds/meals (Comment)   CHL IP OTHER RECOMMENDATIONS 01/19/2017 Recommended Consults -- Oral Care Recommendations Oral care BID Other Recommendations Order thickener from pharmacy;Prohibited food (jello, ice cream, thin soups);Remove water pitcher   CHL IP FOLLOW UP RECOMMENDATIONS 01/19/2017 Follow up Recommendations Inpatient Rehab   CHL IP FREQUENCY AND DURATION 01/19/2017  Speech Therapy Frequency (ACUTE ONLY) min 2x/week Treatment Duration 2 weeks      CHL IP ORAL PHASE 01/19/2017 Oral Phase Impaired Oral - Pudding Teaspoon -- Oral - Pudding Cup -- Oral - Honey Teaspoon -- Oral - Honey Cup Weak lingual manipulation;Lingual pumping;Premature spillage Oral - Nectar Teaspoon -- Oral - Nectar Cup Weak lingual manipulation;Lingual pumping;Premature spillage Oral - Nectar Straw Lingual pumping;Weak lingual manipulation;Premature spillage Oral - Thin Teaspoon NT Oral - Thin Cup Weak lingual manipulation;Lingual pumping;Premature spillage Oral - Thin Straw -- Oral - Puree Weak lingual manipulation;Lingual pumping;Premature spillage Oral - Mech Soft -- Oral - Regular Weak lingual manipulation;Lingual pumping;Premature spillage Oral - Multi-Consistency -- Oral - Pill -- Oral Phase - Comment --  CHL IP PHARYNGEAL PHASE 01/19/2017 Pharyngeal Phase Impaired Pharyngeal- Pudding Teaspoon -- Pharyngeal -- Pharyngeal- Pudding Cup -- Pharyngeal -- Pharyngeal- Honey Teaspoon -- Pharyngeal -- Pharyngeal- Honey Cup Delayed swallow initiation-pyriform sinuses;Reduced anterior laryngeal mobility;Reduced laryngeal elevation;Pharyngeal residue - valleculae Pharyngeal -- Pharyngeal- Nectar Teaspoon -- Pharyngeal -- Pharyngeal- Nectar Cup Delayed swallow initiation-pyriform sinuses;Reduced anterior laryngeal mobility;Reduced laryngeal elevation;Pharyngeal residue - valleculae;Penetration/Aspiration before swallow;Compensatory strategies attempted (with notebox) Pharyngeal Material enters airway, passes BELOW cords without attempt by patient to eject out (silent aspiration) Pharyngeal- Nectar Straw Delayed swallow initiation-pyriform sinuses;Reduced anterior laryngeal mobility;Reduced laryngeal elevation;Pharyngeal residue - valleculae;Penetration/Aspiration before swallow Pharyngeal Material enters airway, remains ABOVE vocal cords and not ejected out Pharyngeal- Thin Teaspoon NT Pharyngeal -- Pharyngeal- Thin Cup  Delayed swallow initiation-pyriform sinuses;Reduced anterior laryngeal mobility;Reduced laryngeal elevation;Pharyngeal residue - valleculae;Compensatory strategies attempted (with notebox);Penetration/Aspiration before swallow Pharyngeal Material enters airway, passes BELOW cords  without attempt by patient to eject out (silent aspiration) Pharyngeal- Thin Straw -- Pharyngeal -- Pharyngeal- Puree Reduced anterior laryngeal mobility;Reduced laryngeal elevation;Pharyngeal residue - valleculae;Delayed swallow initiation-vallecula Pharyngeal -- Pharyngeal- Mechanical Soft -- Pharyngeal -- Pharyngeal- Regular Reduced anterior laryngeal mobility;Reduced laryngeal elevation;Delayed swallow initiation-vallecula Pharyngeal -- Pharyngeal- Multi-consistency -- Pharyngeal -- Pharyngeal- Pill -- Pharyngeal -- Pharyngeal Comment --  CHL IP CERVICAL ESOPHAGEAL PHASE 01/19/2017 Cervical Esophageal Phase WFL Pudding Teaspoon -- Pudding Cup -- Honey Teaspoon -- Honey Cup -- Nectar Teaspoon -- Nectar Cup -- Nectar Straw -- Thin Teaspoon -- Thin Cup -- Thin Straw -- Puree -- Mechanical Soft -- Regular -- Multi-consistency -- Pill -- Cervical Esophageal Comment -- CHL IP GO 12/14/2016 Functional Assessment Tool Used clinical judgment Functional Limitations Memory Swallow Current Status (H0623) (None) Swallow Goal Status (J6283) (None) Swallow Discharge Status (T5176) (None) Motor Speech Current Status (H6073) (None) Motor Speech Goal Status (X1062) (None) Motor Speech Goal Status (I9485) (None) Spoken Language Comprehension Current Status (I6270) (None) Spoken Language Comprehension Goal Status (J5009) (None) Spoken Language Comprehension Discharge Status [phone]) (None) Spoken Language Expression Current Status (X9371) (None) Spoken Language Expression Goal Status (I9678) (None) Spoken Language Expression Discharge Status [phone]) (None) Attention Current Status (F7510) (None) Attention Goal Status (C5852) (None) Attention Discharge Status  (D7824) (None) Memory Current Status (M3536) CK Memory Goal Status (R4431) CK Memory Discharge Status (G9170) CK Voice Current Status (G9171) (None) Voice Goal Status (V4008) (None) Voice Discharge Status (Q7619) (None) Other Speech-Language Pathology Functional Limitation Current Status (J0932) (None) Other Speech-Language Pathology Functional Limitation Goal Status (I7124) (None) Other Speech-Language Pathology Functional Limitation Discharge Status [phone] (None) Germain Osgood 01/19/2017, 11:31 AM  Germain Osgood, M.A. CCC-SLP [phone]             Dg Swallowing Func-speech Pathology  Result Date: 01/06/2017 Objective Swallowing Evaluation: Type of Study: MBS-Modified Barium Swallow Study Patient Details Name: Thomas [name] MRN: [id number] Date of Birth: 1925-08-25 Today's Date: 01/06/2017 Time: SLP Start Time (ACUTE ONLY): 0945-SLP Stop Time (ACUTE ONLY): 1004 SLP Time Calculation (min) (ACUTE ONLY): 19 min Past Medical History: Past Medical History: Diagnosis Date . Adenomatous colon polyp 2009 . Allergy  . Carotid artery occlusion  . Diverticulosis of colon (without mention of hemorrhage)  . ED (erectile dysfunction)  . Esophageal stricture  . Esophagitis  . GERD (gastroesophageal reflux disease)  . Goals of care, counseling/discussion 12/23/2016 . Hiatal hernia  . Hypertension  . Prostate cancer ([place])  . Unspecified gastritis and gastroduodenitis without mention of hemorrhage  Past Surgical History: Past Surgical History: Procedure Laterality Date . ENDARTERECTOMY Left 03/05/2013  Procedure: ENDARTERECTOMY CAROTID;  Surgeon: Conrad York, MD;  Location: [facility];  Service: Vascular;  Laterality: Left; . ESOPHAGOGASTRODUODENOSCOPY (EGD) WITH ESOPHAGEAL DILATION   . PROSTATE SURGERY    Laser surgery HPI: 83 y.o. male with history of paroxysmal atrial fibrillation, recently diagnosed mesothelioma with recurrent pleural effusion, history of occipital stroke with hospitalization 12/13/16, GERD, esophageal  stricture s/p multiple dilations, esophagitis, hiatal hernia. Presented 01/05/17 with 2 day history of increased weakness, facial drooping, slurred speech. CT showing acute interval extensive infarct in the right cerebellum, predominately inferiorly but also some involvement of the superior hemisphere. MRI pending. Seen by SLP during recent admission for cognitive linguistic evaluation only.  Subjective: alert, pleasant, daughter present in fluoro suite Assessment / Plan / Recommendation CHL IP CLINICAL IMPRESSIONS 01/06/2017 Clinical Impression Patient presents with mild oropharyngeal dysphagia c/b oral weakness and delayed swallow initiation leading to gross sensed aspiration of of thin  liquids during the swallow. Clinically pt's management of secretions appears greatly improved today; dysarthria appears mild and voice is clear vs. wet. Suspect residual oral deficits impacting pt's ability to compensate for normative, age-related changes in swallowing. Pt's swallow initiation delayed to pyriform sinuses with thin liquids and larger volumes of nectar, honey-thick liquids. This led to significant aspiration during the swallow with thin liquids x1. A chin tuck was trialed which reduced but did not prevent penetration/aspiration, though this strategy may be appropriate to use with smaller volumes of liquid (tsp). Epiglottic inversion, pharyngeal stripping complete, with no abnormal residue remaining after the swallow. Esophageal sweep was performed, revealing barium tablet and thin column of barium remaining in the distal esophagus. Recommend initiating dys 3 diet with nectar thick liquids, medications whole in puree. SLP will follow for tolerance, training in compensatory strategies, advancement as appropriate. Given pt's esophageal history, long-term use of thickened liquids is not recommended; anticipate advancement of liquids with improvements at bedside. SLP Visit Diagnosis Dysphagia, oropharyngeal phase (R13.12)  Attention and concentration deficit following -- Frontal lobe and executive function deficit following -- Impact on safety and function Mild aspiration risk;Moderate aspiration risk   CHL IP TREATMENT RECOMMENDATION 01/06/2017 Treatment Recommendations Therapy as outlined in treatment plan below   Prognosis 01/06/2017 Prognosis for Safe Diet Advancement Good Barriers to Reach Goals -- Barriers/Prognosis Comment -- CHL IP DIET RECOMMENDATION 01/06/2017 SLP Diet Recommendations Dysphagia 3 (Mech soft) solids;Nectar thick liquid Liquid Administration via Cup;Straw Medication Administration Whole meds with puree Compensations Slow rate;Small sips/bites;Monitor for anterior loss Postural Changes Seated upright at 90 degrees;Remain semi-upright after after feeds/meals (Comment)   CHL IP OTHER RECOMMENDATIONS 01/06/2017 Recommended Consults -- Oral Care Recommendations Oral care BID Other Recommendations Order thickener from pharmacy;Prohibited food (jello, ice cream, thin soups);Remove water pitcher   CHL IP FOLLOW UP RECOMMENDATIONS 01/06/2017 Follow up Recommendations Other (comment)   CHL IP FREQUENCY AND DURATION 01/06/2017 Speech Therapy Frequency (ACUTE ONLY) min 2x/week Treatment Duration 2 weeks      CHL IP ORAL PHASE 01/06/2017 Oral Phase Impaired Oral - Pudding Teaspoon -- Oral - Pudding Cup -- Oral - Honey Teaspoon -- Oral - Honey Cup Weak lingual manipulation;Lingual pumping Oral - Nectar Teaspoon -- Oral - Nectar Cup -- Oral - Nectar Straw Lingual pumping;Weak lingual manipulation Oral - Thin Teaspoon Right anterior bolus loss;Premature spillage Oral - Thin Cup Right anterior bolus loss;Premature spillage Oral - Thin Straw -- Oral - Puree Weak lingual manipulation;Lingual pumping;Delayed oral transit Oral - Mech Soft -- Oral - Regular Impaired mastication;Weak lingual manipulation;Lingual pumping;Delayed oral transit Oral - Multi-Consistency -- Oral - Pill -- Oral Phase - Comment --  CHL IP PHARYNGEAL PHASE  01/06/2017 Pharyngeal Phase Impaired Pharyngeal- Pudding Teaspoon -- Pharyngeal -- Pharyngeal- Pudding Cup -- Pharyngeal -- Pharyngeal- Honey Teaspoon -- Pharyngeal -- Pharyngeal- Honey Cup WFL;Delayed swallow initiation-vallecula;Delayed swallow initiation-pyriform sinuses Pharyngeal -- Pharyngeal- Nectar Teaspoon -- Pharyngeal -- Pharyngeal- Nectar Cup WFL;Delayed swallow initiation-vallecula;Delayed swallow initiation-pyriform sinuses Pharyngeal -- Pharyngeal- Nectar Straw WFL;Delayed swallow initiation-pyriform sinuses;Delayed swallow initiation-vallecula Pharyngeal -- Pharyngeal- Thin Teaspoon Delayed swallow initiation-vallecula Pharyngeal -- Pharyngeal- Thin Cup Delayed swallow initiation-pyriform sinuses;Reduced airway/laryngeal closure;Penetration/Aspiration during swallow;Significant aspiration (Amount) Pharyngeal Material enters airway, passes BELOW cords and not ejected out despite cough attempt by patient Pharyngeal- Thin Straw -- Pharyngeal -- Pharyngeal- Puree WFL Pharyngeal -- Pharyngeal- Mechanical Soft -- Pharyngeal -- Pharyngeal- Regular WFL Pharyngeal -- Pharyngeal- Multi-consistency WFL;Delayed swallow initiation-vallecula Pharyngeal -- Pharyngeal- Pill -- Pharyngeal -- Pharyngeal Comment --  CHL IP CERVICAL ESOPHAGEAL PHASE  01/06/2017 Cervical Esophageal Phase WFL Pudding Teaspoon -- Pudding Cup -- Honey Teaspoon -- Honey Cup -- Nectar Teaspoon -- Nectar Cup -- Nectar Straw -- Thin Teaspoon -- Thin Cup -- Thin Straw -- Puree -- Mechanical Soft -- Regular -- Multi-consistency -- Pill -- Cervical Esophageal Comment -- CHL IP GO 12/14/2016 Functional Assessment Tool Used clinical judgment Functional Limitations Memory Swallow Current Status [phone]) (None) Swallow Goal Status (L3903) (None) Swallow Discharge Status (E0923) (None) Motor Speech Current Status (R0076) (None) Motor Speech Goal Status (A2633) (None) Motor Speech Goal Status (H5456) (None) Spoken Language Comprehension Current Status (Y5638)  (None) Spoken Language Comprehension Goal Status (L3734) (None) Spoken Language Comprehension Discharge Status [phone] (None) Spoken Language Expression Current Status (T1572) (None) Spoken Language Expression Goal Status (I2035) (None) Spoken Language Expression Discharge Status [phone]) (None) Attention Current Status (U3845) (None) Attention Goal Status (X6468) (None) Attention Discharge Status (E3212) (None) Memory Current Status (Y4825) CK Memory Goal Status (O0370) CK Memory Discharge Status (G9170) CK Voice Current Status (G9171) (None) Voice Goal Status (W8889) (None) Voice Discharge Status (V6945) (None) Other Speech-Language Pathology Functional Limitation Current Status (W3888) (None) Other Speech-Language Pathology Functional Limitation Goal Status (K8003) (None) Other Speech-Language Pathology Functional Limitation Discharge Status [phone]) (None) [name], MS, CCC-SLP Speech-Language Pathologist [phone] [name] 01/06/2017, 2:42 PM              Ct Head Code Stroke Wo Contrast  Result Date: 01/05/2017 CLINICAL DATA:  Code stroke. Left weakness and slurred speech. Concern for PICA infarct per report. EXAM: CT HEAD WITHOUT CONTRAST TECHNIQUE: Contiguous axial images were obtained from the base of the skull through the vertex without intravenous contrast. COMPARISON:  Brain MRI 12/13/2016 FINDINGS: Brain: Interval extensive infarct in the right cerebellum, predominately inferiorly but also some involvement of the superior hemisphere. No notable mass effect and no fourth ventricular narrowing. Remote infarcts in the left more than right cerebellum. Chronic small vessel ischemia with extensive gliosis in the bilateral cerebral white matter. Cerebral volume loss, mild for age. No hemorrhage, hydrocephalus, or masslike findings. Vascular: No hyperdense vessel. Extensive atherosclerotic calcification. Skull: Negative Sinuses/Orbits: Negative ASPECTS ([place] Stroke Program Early CT Score) Not  scored with these findings. These results were called by telephone at the time of interpretation on 01/05/2017 at 12:26 pm to Dr. Leonel Ramsay, who verbally acknowledged these results. IMPRESSION: 1. Acute to subacute infarct in the inferior right cerebellum. 2. Small to moderate remote infarcts in the bilateral occipital lobes. 3. Diffuse chronic small vessel ischemia in the cerebral white matter. Electronically Signed   By: Monte Fantasia M.D.   On: 01/05/2017 12:26      Subjective: Pt was seen walking the halls with PT today says he has no complaints.  Discharge Exam: Vitals:   01/23/17 0735 01/23/17 0921  BP: (!) 147/90 133/84  Pulse: 69 83  Resp: 18 20  Temp: 98 F (36.7 C) 98.1 F (36.7 C)   Vitals:   01/23/17 0424 01/23/17 0520 01/23/17 0735 01/23/17 0921  BP: (!) 207/116 (!) 161/91 (!) 147/90 133/84  Pulse: 88 85 69 83  Resp: 18  18 20   Temp: 98.1 F (36.7 C)  98 F (36.7 C) 98.1 F (36.7 C)  TempSrc: Oral  Oral Oral  SpO2: 100%  98% 96%  Weight:      Height:        The results of significant diagnostics from this hospitalization (including imaging, microbiology, ancillary and laboratory) are listed below for reference.  Microbiology: Recent Results (from the past 240 hour(s))  Urine culture     Status: Abnormal   Collection Time: 01/15/17 12:40 PM  Result Value Ref Range Status   Specimen Description URINE, RANDOM  Final   Special Requests NONE  Final   Culture >=100,000 COLONIES/mL ESCHERICHIA COLI (A)  Final   Report Status 01/17/2017 FINAL  Final   Organism ID, Bacteria ESCHERICHIA COLI (A)  Final      Susceptibility   Escherichia coli - MIC*    AMPICILLIN >=32 RESISTANT Resistant     CEFAZOLIN >=64 RESISTANT Resistant     CEFTRIAXONE <=1 SENSITIVE Sensitive     CIPROFLOXACIN <=0.25 SENSITIVE Sensitive     GENTAMICIN <=1 SENSITIVE Sensitive     IMIPENEM <=0.25 SENSITIVE Sensitive     NITROFURANTOIN <=16 SENSITIVE Sensitive     TRIMETH/SULFA <=20  SENSITIVE Sensitive     AMPICILLIN/SULBACTAM 16 INTERMEDIATE Intermediate     PIP/TAZO <=4 SENSITIVE Sensitive     Extended ESBL NEGATIVE Sensitive     * >=100,000 COLONIES/mL ESCHERICHIA COLI  Culture, blood (Routine X 2) w Reflex to ID Panel     Status: None (Preliminary result)   Collection Time: 01/18/17  7:17 PM  Result Value Ref Range Status   Specimen Description BLOOD RIGHT ANTECUBITAL  Final   Special Requests   Final    BOTTLES DRAWN AEROBIC AND ANAEROBIC Blood Culture results may not be optimal due to an excessive volume of blood received in culture bottles   Culture NO GROWTH 4 DAYS  Final   Report Status PENDING  Incomplete  Culture, blood (Routine X 2) w Reflex to ID Panel     Status: None (Preliminary result)   Collection Time: 01/18/17  7:18 PM  Result Value Ref Range Status   Specimen Description BLOOD RIGHT ARM  Final   Special Requests   Final    BOTTLES DRAWN AEROBIC AND ANAEROBIC Blood Culture results may not be optimal due to an excessive volume of blood received in culture bottles   Culture NO GROWTH 4 DAYS  Final   Report Status PENDING  Incomplete     Labs: BNP (last 3 results) No results for input(s): BNP in the last 8760 hours. Basic Metabolic Panel:  Recent Labs Lab 01/18/17 1614 01/19/17 0431 01/20/17 0811  NA 136 136 134*  K 3.6 3.6 3.2*  CL 102 103 101  CO2 24 20* 20*  GLUCOSE 120* 103* 176*  BUN 22* 19 15  CREATININE 1.51* 1.31* 1.20  CALCIUM 8.9 8.9 9.3  MG 2.0  --   --   PHOS 4.3  --   --    Liver Function Tests:  Recent Labs Lab 01/18/17 1614  AST 18  ALT 21  ALKPHOS 69  BILITOT 0.6  PROT 6.8  ALBUMIN 2.9*   No results for input(s): LIPASE, AMYLASE in the last 168 hours. No results for input(s): AMMONIA in the last 168 hours. CBC:  Recent Labs Lab 01/18/17 1614 01/19/17 0431 01/20/17 0811  WBC 5.0 4.9 7.0  NEUTROABS 3.6  --   --   HGB 11.5* 12.2* 14.1  HCT 35.6* 38.9* 42.5  MCV 87.9 87.6 86.4  PLT 295 285 359    Cardiac Enzymes: No results for input(s): CKTOTAL, CKMB, CKMBINDEX, TROPONINI in the last 168 hours. BNP: Invalid input(s): POCBNP CBG:  Recent Labs Lab 01/22/17 0623 01/22/17 1117 01/22/17 1653 01/23/17 0824 01/23/17 1139  GLUCAP 165* 134* 126* 154* 116*   D-Dimer No results  for input(s): DDIMER in the last 72 hours. Hgb A1c No results for input(s): HGBA1C in the last 72 hours. Lipid Profile No results for input(s): CHOL, HDL, LDLCALC, TRIG, CHOLHDL, LDLDIRECT in the last 72 hours. Thyroid function studies No results for input(s): TSH, T4TOTAL, T3FREE, THYROIDAB in the last 72 hours.  Invalid input(s): FREET3 Anemia work up No results for input(s): VITAMINB12, FOLATE, FERRITIN, TIBC, IRON, RETICCTPCT in the last 72 hours. Urinalysis    Component Value Date/Time   COLORURINE YELLOW 01/19/2017 1742   APPEARANCEUR CLEAR 01/19/2017 1742   LABSPEC 1.010 01/19/2017 1742   PHURINE 7.0 01/19/2017 1742   GLUCOSEU NEGATIVE 01/19/2017 1742   HGBUR NEGATIVE 01/19/2017 1742   HGBUR negative 04/20/2010 0000   BILIRUBINUR NEGATIVE 01/19/2017 1742   BILIRUBINUR n 05/18/2015 1123   KETONESUR NEGATIVE 01/19/2017 1742   PROTEINUR NEGATIVE 01/19/2017 1742   UROBILINOGEN 0.2 05/18/2015 1123   UROBILINOGEN 1.0 07/21/2013 1825   NITRITE NEGATIVE 01/19/2017 1742   LEUKOCYTESUR NEGATIVE 01/19/2017 1742   Sepsis Labs Invalid input(s): PROCALCITONIN,  WBC,  LACTICIDVEN Microbiology Recent Results (from the past 240 hour(s))  Urine culture     Status: Abnormal   Collection Time: 01/15/17 12:40 PM  Result Value Ref Range Status   Specimen Description URINE, RANDOM  Final   Special Requests NONE  Final   Culture >=100,000 COLONIES/mL ESCHERICHIA COLI (A)  Final   Report Status 01/17/2017 FINAL  Final   Organism ID, Bacteria ESCHERICHIA COLI (A)  Final      Susceptibility   Escherichia coli - MIC*    AMPICILLIN >=32 RESISTANT Resistant     CEFAZOLIN >=64 RESISTANT Resistant      CEFTRIAXONE <=1 SENSITIVE Sensitive     CIPROFLOXACIN <=0.25 SENSITIVE Sensitive     GENTAMICIN <=1 SENSITIVE Sensitive     IMIPENEM <=0.25 SENSITIVE Sensitive     NITROFURANTOIN <=16 SENSITIVE Sensitive     TRIMETH/SULFA <=20 SENSITIVE Sensitive     AMPICILLIN/SULBACTAM 16 INTERMEDIATE Intermediate     PIP/TAZO <=4 SENSITIVE Sensitive     Extended ESBL NEGATIVE Sensitive     * >=100,000 COLONIES/mL ESCHERICHIA COLI  Culture, blood (Routine X 2) w Reflex to ID Panel     Status: None (Preliminary result)   Collection Time: 01/18/17  7:17 PM  Result Value Ref Range Status   Specimen Description BLOOD RIGHT ANTECUBITAL  Final   Special Requests   Final    BOTTLES DRAWN AEROBIC AND ANAEROBIC Blood Culture results may not be optimal due to an excessive volume of blood received in culture bottles   Culture NO GROWTH 4 DAYS  Final   Report Status PENDING  Incomplete  Culture, blood (Routine X 2) w Reflex to ID Panel     Status: None (Preliminary result)   Collection Time: 01/18/17  7:18 PM  Result Value Ref Range Status   Specimen Description BLOOD RIGHT ARM  Final   Special Requests   Final    BOTTLES DRAWN AEROBIC AND ANAEROBIC Blood Culture results may not be optimal due to an excessive volume of blood received in culture bottles   Culture NO GROWTH 4 DAYS  Final   Report Status PENDING  Incomplete    Time coordinating discharge: 35 mins  SIGNED:  Irwin Brakeman, MD  [facility] 01/23/2017, 1:39 PM Pager [phone]  If 7PM-7AM, please contact night-coverage [email or web address] Password [email or web address]

## 2017-01-25 ENCOUNTER — Encounter: Payer: Self-pay | Admitting: Cardiology

## 2017-01-25 ENCOUNTER — Ambulatory Visit (INDEPENDENT_AMBULATORY_CARE_PROVIDER_SITE_OTHER): Payer: Medicare Other | Admitting: Cardiology

## 2017-01-25 VITALS — BP 150/88 | HR 84 | Ht 70.0 in | Wt 156.0 lb

## 2017-01-25 DIAGNOSIS — I10 Essential (primary) hypertension: Secondary | ICD-10-CM | POA: Diagnosis not present

## 2017-01-25 DIAGNOSIS — I482 Chronic atrial fibrillation, unspecified: Secondary | ICD-10-CM

## 2017-01-25 DIAGNOSIS — R55 Syncope and collapse: Secondary | ICD-10-CM | POA: Diagnosis not present

## 2017-01-25 DIAGNOSIS — E785 Hyperlipidemia, unspecified: Secondary | ICD-10-CM

## 2017-01-25 NOTE — Patient Instructions (Signed)
Medication Instructions:  Continue current medications  Labwork: None Ordered  Testing/Procedures: Your physician has recommended that you wear an event monitor for 21 days. Event monitors are medical devices that record the heart's electrical activity. Doctors most often [place] these monitors to diagnose arrhythmias. Arrhythmias are problems with the speed or rhythm of the heartbeat. The monitor is a small, portable device. You can wear one while you do your normal daily activities. This is usually used to diagnose what is causing palpitations/syncope (passing out).  Follow-Up: Your physician wants you to follow-up in: 6 Months. You will receive a reminder letter in the mail two months in advance. If you don't receive a letter, please call our office to schedule the follow-up appointment.   Any Other Special Instructions Will Be Listed Below (If Applicable).   If you need a refill on your cardiac medications before your next appointment, please call your pharmacy.

## 2017-02-02 ENCOUNTER — Telehealth: Payer: Self-pay | Admitting: Family Medicine

## 2017-02-02 MED ORDER — LORAZEPAM 0.5 MG PO TABS: 0.5000 mg | ORAL_TABLET | Freq: Three times a day (TID) | ORAL | 2 refills | Status: DC | PRN

## 2017-02-02 NOTE — Telephone Encounter (Signed)
Spoke with [facility] and advised. Rx refill called in to pharmacy. Spoke with pt's daughter and advised on referral and refill. Nothing further needed.

## 2017-02-02 NOTE — Telephone Encounter (Signed)
Yes thanks, I will be attending  May refill ativan #30

## 2017-02-02 NOTE — Telephone Encounter (Signed)
Katharine Look @ Hospice is calling to see if you will be attending physician and needs a verbal ok for Hospice admission.   [name] is also asking for refill on Ativan.   Dr. Yong Channel - Please advise. Thanks!

## 2017-02-02 NOTE — Telephone Encounter (Addendum)
Pt will be home on sun or monday and daughter is requesting hospice care due to cancer and dementia. Pt is not taking chemo due to age etc pt was taking ativan while in rehab. Pt daughter is requesting the med to help keep her dad calm. [facility] [address]

## 2017-02-06 ENCOUNTER — Telehealth: Payer: Self-pay | Admitting: Cardiology

## 2017-02-06 ENCOUNTER — Encounter (INDEPENDENT_AMBULATORY_CARE_PROVIDER_SITE_OTHER): Payer: Medicare Other

## 2017-02-06 DIAGNOSIS — R55 Syncope and collapse: Secondary | ICD-10-CM | POA: Diagnosis not present

## 2017-02-06 NOTE — Telephone Encounter (Signed)
Returned call to [employer] with Thomas [name] calling to report patient had 2 episodes of AFib today.Rates 70-90 and 70-80. Spoke to patient daughter Thomas [name] father is doing ok taking eliquis and all medications as prescribed.Advised to wear monitor as ordered.

## 2017-02-06 NOTE — Telephone Encounter (Signed)
Thomas [name] from [employer], with abnormal result. 11:03 am patient had first onset of AFIB and rate was from 70-90 BPM. At 12:40 pm patient was still in AFIB, rate was 70-80 BPM. Thomas [name] states that she spoke with Pt son and the patient did not have any other symptoms.

## 2017-02-09 ENCOUNTER — Telehealth: Payer: Self-pay | Admitting: Family Medicine

## 2017-02-09 ENCOUNTER — Telehealth: Payer: Self-pay | Admitting: Cardiology

## 2017-02-09 NOTE — Telephone Encounter (Signed)
Spoke with Cynthia from [facility] and provided orders as requested 

## 2017-02-09 NOTE — Telephone Encounter (Signed)
Caren Griffins with [facility] calling to request verbal orders for nurse will be seeing pt  2 wk /2 1 wk /5  Going to be doing PT, OT and ST (dementia issues) and a Education officer, museum  Please leave message  If she doesn't answer.

## 2017-02-09 NOTE — Telephone Encounter (Signed)
Notified overnight by [employer] that patient had 3 second pause. Rhythm prior to pause was afib, rate 80-90, and returned to same after pause. [facility] reached patient's daugher, who confirmed that patient was asymptomatic during the event.  Buford Dresser, cardiology night physician.

## 2017-02-12 ENCOUNTER — Encounter: Payer: Self-pay | Admitting: Family Medicine

## 2017-02-12 ENCOUNTER — Ambulatory Visit (INDEPENDENT_AMBULATORY_CARE_PROVIDER_SITE_OTHER): Payer: Medicare Other | Admitting: Family Medicine

## 2017-02-12 VITALS — BP 144/96 | HR 96 | Temp 97.7°F | Ht 70.0 in | Wt 156.4 lb

## 2017-02-12 DIAGNOSIS — R350 Frequency of micturition: Secondary | ICD-10-CM | POA: Diagnosis not present

## 2017-02-12 DIAGNOSIS — R413 Other amnesia: Secondary | ICD-10-CM | POA: Insufficient documentation

## 2017-02-12 DIAGNOSIS — I69398 Other sequelae of cerebral infarction: Secondary | ICD-10-CM | POA: Diagnosis not present

## 2017-02-12 DIAGNOSIS — R42 Dizziness and giddiness: Secondary | ICD-10-CM | POA: Diagnosis not present

## 2017-02-12 LAB — POC URINALSYSI DIPSTICK (AUTOMATED)
BILIRUBIN UA: NEGATIVE
GLUCOSE UA: NEGATIVE
KETONES UA: NEGATIVE
Leukocytes, UA: NEGATIVE
PH UA: 6 (ref 5.0–8.0)
SPEC GRAV UA: 1.025 (ref 1.010–1.025)
Urobilinogen, UA: 1 E.U./dL

## 2017-02-12 MED ORDER — QUETIAPINE FUMARATE 25 MG PO TABS: 25.0000 mg | ORAL_TABLET | Freq: Every day | ORAL | 2 refills | Status: DC

## 2017-02-12 NOTE — Progress Notes (Signed)
Subjective:  Thomas [name] is a 83 y.o. year old very pleasant male patient who presents for/with See problem oriented charting ROS- level 5 caveat applies due to memory loss. With his current communication ability he does deny worsening shortness of breath, worsening edema, fever, chills.    Past Medical History-  Patient Active Problem List   Diagnosis Date Noted  . Vertigo following cerebrovascular accident 12/16/2016    Priority: High  . Type 2 diabetes mellitus with hyperlipidemia ([place]) 12/16/2016    Priority: High  . Permanent atrial fibrillation ([place]) 07/21/2016    Priority: High  . Malignant mesothelioma of pleura ([place]) 02/16/2016    Priority: High  . Carotid stenosis 02/21/2013    Priority: High  . Intracranial vascular stenosis 02/17/2013    Priority: High  . PROSTATE CANCER, HX OF 03/26/2007    Priority: High  . Alcohol abuse 08/04/2016    Priority: Medium  . GERD (gastroesophageal reflux disease) 05/17/2011    Priority: Medium  . Hyperlipidemia 03/20/2007    Priority: Medium  . Essential hypertension 03/20/2007    Priority: Medium  . Perforation of right tympanic membrane 11/24/2013    Priority: Low  . Dysphagia 05/17/2011    Priority: Low  . Stricture and stenosis of esophagus 08/29/2010    Priority: Low  . Allergic rhinitis 01/16/2008    Priority: Low  . ERECTILE DYSFUNCTION, ORGANIC 03/26/2007    Priority: Low  . Memory loss due to medical condition 02/12/2017  . Acute blood loss anemia   . Insomnia 12/07/2016    Medications- reviewed and updated Current Outpatient Prescriptions  Medication Sig Dispense Refill  . amLODipine (NORVASC) 5 MG tablet Take 1 tablet (5 mg total) by mouth daily. 30 tablet 3  . apixaban (ELIQUIS) 5 MG TABS tablet Take 5 mg by mouth 2 (two) times daily.    Marland Kitchen aspirin EC 81 MG EC tablet Take 1 tablet (81 mg total) by mouth daily.    . cloNIDine (CATAPRES) 0.1 MG tablet Take 1 tablet (0.1 mg total) by mouth 2 (two) times daily.  90 tablet 4  . LORazepam (ATIVAN) 0.5 MG tablet Take 1 tablet (0.5 mg total) by mouth every 8 (eight) hours as needed for anxiety, sedation or sleep. 30 tablet 2  . metoCLOPramide (REGLAN) 5 MG tablet Take 1 tablet (5 mg total) by mouth every 8 (eight) hours as needed for nausea. 15 tablet 0  . rosuvastatin (CRESTOR) 20 MG tablet Take 1 tablet (20 mg total) by mouth daily.     No current facility-administered medications for this visit.     Objective: BP (!) 144/96 (BP Location: Left Arm, Patient Position: Sitting, Cuff Size: Normal)   Pulse 96   Temp 97.7 F (36.5 C) (Oral)   Ht 5\' 10"  (1.778 m)   Wt 156 lb 6.4 oz (70.9 kg)   SpO2 91%   BMI 22.44 kg/m  Gen: NAD, resting comfortably CV: irregularly irregular no murmurs rubs or gallops Lungs: CTAB no crackles, wheeze, rhonchi Abdomen: thin Ext: no edema Skin: warm, dry Neuro: pleasantly confused  Results for orders placed or performed in visit on 02/12/17 (from the past 24 hour(s))  POCT Urinalysis Dipstick (Automated)     Status: None   Collection Time: 02/12/17  4:09 PM  Result Value Ref Range   Color, UA yellow    Clarity, UA cloudy    Glucose, UA n    Bilirubin, UA n    Ketones, UA n    Spec Grav,  UA 1.025 1.010 - 1.025   Blood, UA 1+    pH, UA 6.0 5.0 - 8.0   Protein, UA 1+    Urobilinogen, UA 1.0 0.2 or 1.0 E.U./dL   Nitrite, UA 1+    Leukocytes, UA Negative Negative   Assessment/Plan:  Urinary frequency - Plan: POCT Urinalysis Dipstick (Automated), Urine Culture  Memory loss due to medical condition  Vertigo following cerebrovascular accident  Permanent atrial fibrillation ([place]) S: patient has had continued urinary frequency of about every 10 minutes since getting out of rehab per family's report- they think it may date back to after the stroke even. He has been treated for UTI once. Continued memory loss/confusion post stroke nad not clearly worse. No fever or chills. No suprapubic pain.   01/15/17 E. Coli.  UTI treated with 3 days antibiotics Resistant to augmentin and keflex. sesnsitive to ceftriaxone, cipro, nitrofurantoin, bactrim.  A/P: Urinalysis concerning for UTI. Patient with symptoms for several weeks now- family ok with waiting on culture before treating. Did not do rectal exam but if has recurrent symptoms may have to consider with possible prostatitis. On other hand- potential hospice patient- may be best to avoid rectal exam. Doubt diabetes as cause with last a1c controlled. Could be BPH- could consider flomax but worry about fall risk (already using walker). Making it hard to sleep  Memory loss due to medical condition S: memory loss post stroke. Family states he has become agitated and it is upsetting their mother. Both of them are candidates for hospice but i[facility] postponed and [facility]. Family is nervous about changing to hospice- I encouraged them to use the resource  Was supposed to be in hospital bed- but he declined that. High risk for falls and hard ot work with him.   Family requests something to help him sleep and stay more calm. Apparently ativan is not working so well. They also ask about namenda and aricept which we discussed not clear if would benefit him with vascular dementia. May have done better in hospital with haldol A/P:  We agreed to try seroquel at night- discussed antipsychotic increased mortality risk but for now they are mainly looking at quality of life. Considered remeron but he is not sleeping well but eats very well and worry about weight gain.    4-6 weeks offered. Noted slightly elevated diastolic BP but controlled on last visit- opted to monitor only for now given already adding seroquel. Discharge note mentions 194-174 systolic BP goal  Orders Placed This Encounter  Procedures  . Urine Culture    [facility]  . POCT Urinalysis Dipstick (Automated)    Meds ordered this encounter  Medications  . QUEtiapine (SEROQUEL)  25 MG tablet    Sig: Take 1 tablet (25 mg total) by mouth at bedtime.    Dispense:  30 tablet    Refill:  2   The duration of face-to-face time during this visit was greater than 25 minutes. Greater than 50% of this time was spent in counseling, explanation of diagnosis, planning of further management, and/or coordination of care including discussing potential UTI and causes, discussing memory loss after stroke, changes in his behavior, benefits and risks of treating with medication, and discussion of different medications.   Return precautions advised.  Garret Reddish, MD

## 2017-02-12 NOTE — Assessment & Plan Note (Signed)
S: memory loss post stroke. Family states he has become agitated and it is upsetting their mother. Both of them are candidates for hospice but ihave postponed and [facility] both [facility]. Family is nervous about changing to hospice- I encouraged them to use the resource  Was supposed to be in hospital bed- but he declined that. High risk for falls and hard ot work with him.   Family requests something to help him sleep and stay more calm. Apparently ativan is not working so well. They also ask about namenda and aricept which we discussed not clear if would benefit him with vascular dementia. May have done better in hospital with haldol A/P:  We agreed to try seroquel at night- discussed antipsychotic increased mortality risk but for now they are mainly looking at quality of life. Considered remeron but he is not sleeping well but eats very well and worry about weight gain.

## 2017-02-12 NOTE — Patient Instructions (Addendum)
Drop off urine before you go  Diabetes is a possible cause of frequent urination as well- has sugar been running high?  Trial seroquel 25mg  at night to see if helps with sleep and anxiety issues  Happy to check in with you whenever needed- if you want to reassess 4-6 weeks that would be fine

## 2017-02-13 ENCOUNTER — Telehealth: Payer: Self-pay | Admitting: Family Medicine

## 2017-02-13 NOTE — Telephone Encounter (Signed)
Requires a PA that is awaiting a response

## 2017-02-13 NOTE — Telephone Encounter (Signed)
° ° °  Pt daughter called to say the pharmacy did not received the below med    QUEtiapine (SEROQUEL) 25 MG tablet   [facility]

## 2017-02-13 NOTE — Telephone Encounter (Signed)
PA is pending. Key: [id number]

## 2017-02-15 ENCOUNTER — Inpatient Hospital Stay (HOSPITAL_COMMUNITY)
Admission: EM | Admit: 2017-02-15 | Discharge: 2017-02-21 | DRG: 065 | Disposition: A | Discharge status: Skilled Nursing Facility | Payer: Medicare Other | Attending: Internal Medicine | Admitting: Internal Medicine

## 2017-02-15 ENCOUNTER — Other Ambulatory Visit: Payer: Self-pay

## 2017-02-15 ENCOUNTER — Emergency Department (HOSPITAL_COMMUNITY): Payer: Medicare Other

## 2017-02-15 ENCOUNTER — Encounter (HOSPITAL_COMMUNITY): Payer: Self-pay | Admitting: Emergency Medicine

## 2017-02-15 DIAGNOSIS — N390 Urinary tract infection, site not specified: Secondary | ICD-10-CM | POA: Diagnosis present

## 2017-02-15 DIAGNOSIS — F0390 Unspecified dementia without behavioral disturbance: Secondary | ICD-10-CM | POA: Diagnosis present

## 2017-02-15 DIAGNOSIS — I63531 Cerebral infarction due to unspecified occlusion or stenosis of right posterior cerebral artery: Secondary | ICD-10-CM | POA: Diagnosis present

## 2017-02-15 DIAGNOSIS — R413 Other amnesia: Secondary | ICD-10-CM | POA: Diagnosis not present

## 2017-02-15 DIAGNOSIS — Z515 Encounter for palliative care: Secondary | ICD-10-CM | POA: Diagnosis present

## 2017-02-15 DIAGNOSIS — E785 Hyperlipidemia, unspecified: Secondary | ICD-10-CM | POA: Diagnosis present

## 2017-02-15 DIAGNOSIS — E1122 Type 2 diabetes mellitus with diabetic chronic kidney disease: Secondary | ICD-10-CM | POA: Diagnosis present

## 2017-02-15 DIAGNOSIS — I130 Hypertensive heart and chronic kidney disease with heart failure and stage 1 through stage 4 chronic kidney disease, or unspecified chronic kidney disease: Secondary | ICD-10-CM | POA: Diagnosis present

## 2017-02-15 DIAGNOSIS — I4821 Permanent atrial fibrillation: Secondary | ICD-10-CM | POA: Diagnosis present

## 2017-02-15 DIAGNOSIS — I63541 Cerebral infarction due to unspecified occlusion or stenosis of right cerebellar artery: Principal | ICD-10-CM | POA: Diagnosis present

## 2017-02-15 DIAGNOSIS — R1313 Dysphagia, pharyngeal phase: Secondary | ICD-10-CM | POA: Diagnosis present

## 2017-02-15 DIAGNOSIS — R471 Dysarthria and anarthria: Secondary | ICD-10-CM | POA: Diagnosis present

## 2017-02-15 DIAGNOSIS — B9689 Other specified bacterial agents as the cause of diseases classified elsewhere: Secondary | ICD-10-CM | POA: Diagnosis present

## 2017-02-15 DIAGNOSIS — I482 Chronic atrial fibrillation: Secondary | ICD-10-CM | POA: Diagnosis present

## 2017-02-15 DIAGNOSIS — I63539 Cerebral infarction due to unspecified occlusion or stenosis of unspecified posterior cerebral artery: Secondary | ICD-10-CM | POA: Diagnosis present

## 2017-02-15 DIAGNOSIS — R29704 NIHSS score 4: Secondary | ICD-10-CM | POA: Diagnosis present

## 2017-02-15 DIAGNOSIS — Z7901 Long term (current) use of anticoagulants: Secondary | ICD-10-CM

## 2017-02-15 DIAGNOSIS — Z7982 Long term (current) use of aspirin: Secondary | ICD-10-CM

## 2017-02-15 DIAGNOSIS — F03918 Unspecified dementia, unspecified severity, with other behavioral disturbance: Secondary | ICD-10-CM

## 2017-02-15 DIAGNOSIS — R1311 Dysphagia, oral phase: Secondary | ICD-10-CM | POA: Diagnosis present

## 2017-02-15 DIAGNOSIS — E876 Hypokalemia: Secondary | ICD-10-CM | POA: Diagnosis present

## 2017-02-15 DIAGNOSIS — F1010 Alcohol abuse, uncomplicated: Secondary | ICD-10-CM | POA: Diagnosis not present

## 2017-02-15 DIAGNOSIS — I2510 Atherosclerotic heart disease of native coronary artery without angina pectoris: Secondary | ICD-10-CM | POA: Diagnosis present

## 2017-02-15 DIAGNOSIS — I5020 Unspecified systolic (congestive) heart failure: Secondary | ICD-10-CM | POA: Diagnosis present

## 2017-02-15 DIAGNOSIS — Z8673 Personal history of transient ischemic attack (TIA), and cerebral infarction without residual deficits: Secondary | ICD-10-CM

## 2017-02-15 DIAGNOSIS — R270 Ataxia, unspecified: Secondary | ICD-10-CM | POA: Diagnosis present

## 2017-02-15 DIAGNOSIS — E119 Type 2 diabetes mellitus without complications: Secondary | ICD-10-CM

## 2017-02-15 DIAGNOSIS — G8191 Hemiplegia, unspecified affecting right dominant side: Secondary | ICD-10-CM | POA: Diagnosis present

## 2017-02-15 DIAGNOSIS — R1310 Dysphagia, unspecified: Secondary | ICD-10-CM | POA: Diagnosis not present

## 2017-02-15 DIAGNOSIS — Z66 Do not resuscitate: Secondary | ICD-10-CM | POA: Diagnosis not present

## 2017-02-15 DIAGNOSIS — Z8546 Personal history of malignant neoplasm of prostate: Secondary | ICD-10-CM | POA: Diagnosis not present

## 2017-02-15 DIAGNOSIS — C450 Mesothelioma of pleura: Secondary | ICD-10-CM | POA: Diagnosis present

## 2017-02-15 DIAGNOSIS — R29810 Facial weakness: Secondary | ICD-10-CM | POA: Diagnosis present

## 2017-02-15 DIAGNOSIS — K222 Esophageal obstruction: Secondary | ICD-10-CM

## 2017-02-15 DIAGNOSIS — I10 Essential (primary) hypertension: Secondary | ICD-10-CM | POA: Diagnosis present

## 2017-02-15 DIAGNOSIS — K219 Gastro-esophageal reflux disease without esophagitis: Secondary | ICD-10-CM | POA: Diagnosis present

## 2017-02-15 DIAGNOSIS — I639 Cerebral infarction, unspecified: Secondary | ICD-10-CM

## 2017-02-15 DIAGNOSIS — Z87891 Personal history of nicotine dependence: Secondary | ICD-10-CM

## 2017-02-15 DIAGNOSIS — N189 Chronic kidney disease, unspecified: Secondary | ICD-10-CM | POA: Diagnosis present

## 2017-02-15 DIAGNOSIS — I651 Occlusion and stenosis of basilar artery: Secondary | ICD-10-CM | POA: Diagnosis present

## 2017-02-15 DIAGNOSIS — Z880 Allergy status to penicillin: Secondary | ICD-10-CM

## 2017-02-15 DIAGNOSIS — R4781 Slurred speech: Secondary | ICD-10-CM | POA: Diagnosis present

## 2017-02-15 DIAGNOSIS — F0391 Unspecified dementia with behavioral disturbance: Secondary | ICD-10-CM

## 2017-02-15 DIAGNOSIS — R1312 Dysphagia, oropharyngeal phase: Secondary | ICD-10-CM | POA: Diagnosis present

## 2017-02-15 DIAGNOSIS — Z79899 Other long term (current) drug therapy: Secondary | ICD-10-CM

## 2017-02-15 LAB — COMPREHENSIVE METABOLIC PANEL
ALBUMIN: 3.4 g/dL — AB (ref 3.5–5.0)
ALT: 14 U/L — ABNORMAL LOW (ref 17–63)
AST: 22 U/L (ref 15–41)
Alkaline Phosphatase: 80 U/L (ref 38–126)
Anion gap: 15 (ref 5–15)
BILIRUBIN TOTAL: 0.8 mg/dL (ref 0.3–1.2)
BUN: 17 mg/dL (ref 6–20)
CHLORIDE: 105 mmol/L (ref 101–111)
CO2: 16 mmol/L — ABNORMAL LOW (ref 22–32)
Calcium: 9.1 mg/dL (ref 8.9–10.3)
Creatinine, Ser: 1.31 mg/dL — ABNORMAL HIGH (ref 0.61–1.24)
GFR calc Af Amer: 54 mL/min — ABNORMAL LOW (ref >60)
GFR calc non Af Amer: 47 mL/min — ABNORMAL LOW (ref >60)
GLUCOSE: 153 mg/dL — AB (ref 65–99)
POTASSIUM: 3 mmol/L — AB (ref 3.5–5.1)
Sodium: 136 mmol/L (ref 135–145)
TOTAL PROTEIN: 7.8 g/dL (ref 6.5–8.1)

## 2017-02-15 LAB — DIFFERENTIAL
BASOS ABS: 0 10*3/uL (ref 0.0–0.1)
Basophils Relative: 0 %
EOS ABS: 0 10*3/uL (ref 0.0–0.7)
Eosinophils Relative: 0 %
LYMPHS ABS: 1.2 10*3/uL (ref 0.7–4.0)
Lymphocytes Relative: 16 %
Monocytes Absolute: 1.2 10*3/uL — ABNORMAL HIGH (ref 0.1–1.0)
Monocytes Relative: 16 %
NEUTROS ABS: 5.2 10*3/uL (ref 1.7–7.7)
NEUTROS PCT: 68 %

## 2017-02-15 LAB — I-STAT CHEM 8, ED
BUN: 20 mg/dL (ref 6–20)
CHLORIDE: 106 mmol/L (ref 101–111)
Calcium, Ion: 1.06 mmol/L — ABNORMAL LOW (ref 1.15–1.40)
Creatinine, Ser: 1.1 mg/dL (ref 0.61–1.24)
Glucose, Bld: 153 mg/dL — ABNORMAL HIGH (ref 65–99)
HEMATOCRIT: 39 % (ref 39.0–52.0)
Hemoglobin: 13.3 g/dL (ref 13.0–17.0)
Potassium: 3.1 mmol/L — ABNORMAL LOW (ref 3.5–5.1)
SODIUM: 142 mmol/L (ref 135–145)
TCO2: 19 mmol/L (ref 0–100)

## 2017-02-15 LAB — URINE CULTURE

## 2017-02-15 LAB — URINALYSIS, ROUTINE W REFLEX MICROSCOPIC
BILIRUBIN URINE: NEGATIVE
GLUCOSE, UA: NEGATIVE mg/dL
Ketones, ur: 5 mg/dL — AB
Nitrite: POSITIVE — AB
PH: 5 (ref 5.0–8.0)
Protein, ur: 30 mg/dL — AB
SPECIFIC GRAVITY, URINE: 1.021 (ref 1.005–1.030)

## 2017-02-15 LAB — CBC
HCT: 39.8 % (ref 39.0–52.0)
HEMOGLOBIN: 12.7 g/dL — AB (ref 13.0–17.0)
MCH: 28.3 pg (ref 26.0–34.0)
MCHC: 31.9 g/dL (ref 30.0–36.0)
MCV: 88.8 fL (ref 78.0–100.0)
Platelets: 267 10*3/uL (ref 150–400)
RBC: 4.48 MIL/uL (ref 4.22–5.81)
RDW: 17 % — ABNORMAL HIGH (ref 11.5–15.5)
WBC: 7.6 10*3/uL (ref 4.0–10.5)

## 2017-02-15 LAB — I-STAT TROPONIN, ED: Troponin i, poc: 0.02 ng/mL (ref 0.00–0.08)

## 2017-02-15 LAB — APTT: APTT: 41 s — AB (ref 24–36)

## 2017-02-15 LAB — CBG MONITORING, ED: Glucose-Capillary: 150 mg/dL — ABNORMAL HIGH (ref 65–99)

## 2017-02-15 LAB — PROTIME-INR
INR: 1.35
Prothrombin Time: 16.8 seconds — ABNORMAL HIGH (ref 11.4–15.2)

## 2017-02-15 MED ORDER — CEPHALEXIN 500 MG PO CAPS: 500.0000 mg | ORAL_CAPSULE | Freq: Two times a day (BID) | ORAL | Status: DC

## 2017-02-15 MED ORDER — SODIUM CHLORIDE 0.9 % IV SOLN
INTRAVENOUS | Status: DC
  Administered 2017-02-15: via INTRAVENOUS

## 2017-02-15 MED ORDER — SENNOSIDES-DOCUSATE SODIUM 8.6-50 MG PO TABS: 1.0000 | ORAL_TABLET | Freq: Every evening | ORAL | Status: DC | PRN

## 2017-02-15 MED ORDER — ACETAMINOPHEN 650 MG RE SUPP: 650.0000 mg | RECTAL | Status: DC | PRN

## 2017-02-15 MED ORDER — APIXABAN 5 MG PO TABS
5.0000 mg | ORAL_TABLET | Freq: Two times a day (BID) | ORAL | Status: DC
  Administered 2017-02-16 – 2017-02-21 (×11): 5 mg via ORAL
  Filled 2017-02-15 (×11): qty 1

## 2017-02-15 MED ORDER — ASPIRIN EC 81 MG PO TBEC
81.0000 mg | DELAYED_RELEASE_TABLET | Freq: Every day | ORAL | Status: DC
  Administered 2017-02-16 – 2017-02-18 (×3): 81 mg via ORAL
  Filled 2017-02-15 (×3): qty 1

## 2017-02-15 MED ORDER — ACETAMINOPHEN 325 MG PO TABS
650.0000 mg | ORAL_TABLET | ORAL | Status: DC | PRN
  Administered 2017-02-16 – 2017-02-20 (×4): 650 mg via ORAL
  Filled 2017-02-15 (×5): qty 2

## 2017-02-15 MED ORDER — CLONIDINE HCL 0.1 MG PO TABS
0.1000 mg | ORAL_TABLET | Freq: Two times a day (BID) | ORAL | Status: DC
  Administered 2017-02-16 – 2017-02-21 (×11): 0.1 mg via ORAL
  Filled 2017-02-15 (×11): qty 1

## 2017-02-15 MED ORDER — ACETAMINOPHEN 160 MG/5ML PO SOLN
650.0000 mg | ORAL | Status: DC | PRN
  Administered 2017-02-16 – 2017-02-17 (×2): 650 mg
  Filled 2017-02-15 (×2): qty 20.3

## 2017-02-15 MED ORDER — QUETIAPINE FUMARATE 25 MG PO TABS
25.0000 mg | ORAL_TABLET | Freq: Every day | ORAL | Status: DC
  Administered 2017-02-16 – 2017-02-20 (×5): 25 mg via ORAL
  Filled 2017-02-15 (×5): qty 1

## 2017-02-15 MED ORDER — STROKE: EARLY STAGES OF RECOVERY BOOK
Freq: Once | Status: AC
  Administered 2017-02-15

## 2017-02-15 MED ORDER — AMLODIPINE BESYLATE 5 MG PO TABS
5.0000 mg | ORAL_TABLET | Freq: Every day | ORAL | Status: DC
  Administered 2017-02-16 – 2017-02-21 (×6): 5 mg via ORAL
  Filled 2017-02-15 (×7): qty 1

## 2017-02-15 MED ORDER — ROSUVASTATIN CALCIUM 20 MG PO TABS
20.0000 mg | ORAL_TABLET | Freq: Every day | ORAL | Status: DC
  Administered 2017-02-16 – 2017-02-21 (×6): 20 mg via ORAL
  Filled 2017-02-15 (×7): qty 1

## 2017-02-15 MED ORDER — CEPHALEXIN 500 MG PO CAPS: 500.0000 mg | ORAL_CAPSULE | Freq: Two times a day (BID) | ORAL | 0 refills | Status: DC

## 2017-02-15 NOTE — Consult Note (Signed)
Requesting Physician: Dr. Roderic Palau    Chief Complaint: R facial droop and slurred speech  History obtained from:   Patient and Chart   And family  HPI:                                                                                                                                         Thomas [name] is an 83 y.o. male with complicated PMH significant for multiple prior strokes, dementia, HTN, CHF EF40% and afib on Eliquis ( last dose today) who presented with acute onsent of slurred speech and R facial droop started at 6 pm today and witnessed by family, pt also was diagnosed recently with UTI and was supposed to be  Started on antibiotics today,  Denied any fall, no focal weakness. In ED [facility] was ordered and showed no acute changes, pt was not a candidate for IVTPA due rto Eliquis use, discussed case with patient family and the need to order CTA to evaluate  if there is any LVO and offer thrombectomy, family did not want to proceed with any aggressive measurements like any intervention due to patient age and medical issues, CTA was cancelled and pt was admitted for medical management of acute stroke.  Date last known well: 02/15/17 Time last known well: 6 pm tPA Given:no, on Eliquis  Requires help but walks without assistance     3  Modified Rankin:3-4   Past Medical History:  Diagnosis Date  . Adenomatous colon polyp 2009  . Allergy   . Carotid artery occlusion   . Diverticulosis of colon (without mention of hemorrhage)   . ED (erectile dysfunction)   . Esophageal stricture   . Esophagitis   . GERD (gastroesophageal reflux disease)   . Goals of care, counseling/discussion 12/23/2016  . Hiatal hernia   . Hypertension   . Prostate cancer ([place])   . Unspecified gastritis and gastroduodenitis without mention of hemorrhage     Past Surgical History:  Procedure Laterality Date  . ENDARTERECTOMY Left 03/05/2013   Procedure: ENDARTERECTOMY CAROTID;  Surgeon: Conrad [place], MD;  Location: [facility];  Service: Vascular;  Laterality: Left;  . ESOPHAGOGASTRODUODENOSCOPY (EGD) WITH ESOPHAGEAL DILATION    . PROSTATE SURGERY     Laser surgery    Family History  Problem Relation Age of Onset  . Heart disease Father        unclear specifics  . Cancer Mother   . Breast cancer Sister   . Hyperlipidemia Daughter   . Hypertension Daughter   . Hypertension Son    Social History:  reports that he quit smoking about 63 years ago. His smoking use included Cigarettes. He has a 2.00 pack-year smoking history. He has never used smokeless tobacco. He reports that he drinks alcohol. He reports that he does not use drugs.  Allergies:  Allergies  Allergen Reactions  . Ace Inhibitors Hives  .  Penicillins Swelling    Swelling  of the face. Has patient had a PCN reaction causing immediate rash, facial/tongue/throat swelling, SOB or lightheadedness with hypotension: YES Has patient had a PCN reaction causing severe rash involving mucus membranes or skin necrosis: NO Has patient had a PCN reaction that required hospitalizationNO Has patient had a PCN reaction occurring within the last 10 years: NO If all of the above answers are "NO", then may proceed with Cephalosporin use.    Medications:                                                                                                                           I have reviewed the patient's current medications.  ROS:                                                                                                                                       History obtained from patient and family   General ROS: negative for - chills, fatigue, fever, night sweats, weight gain or weight loss Psychological ROS: negative for - behavioral disorder, hallucinations, memory difficulties, mood swings or suicidal ideation Ophthalmic ROS: negative for - blurry vision, double vision, eye pain or loss of vision ENT ROS: negative for - epistaxis, nasal discharge, oral  lesions, sore throat, tinnitus or vertigo Allergy and Immunology ROS: negative for - hives or itchy/watery eyes Hematological and Lymphatic ROS: negative for - bleeding problems, bruising or swollen lymph nodes Endocrine ROS: negative for - galactorrhea, hair pattern changes, polydipsia/polyuria or temperature intolerance Respiratory ROS: negative for - cough, hemoptysis, shortness of breath or wheezing Cardiovascular ROS: negative for - chest pain, dyspnea on exertion, edema or irregular heartbeat Gastrointestinal ROS: negative for - abdominal pain, diarrhea, hematemesis, nausea/vomiting or stool incontinence Genito-Urinary ROS: negative for - dysuria, hematuria, incontinence or urinary frequency/urgency Musculoskeletal ROS: negative for - joint swelling or muscular weakness Neurological ROS: as noted in HPI Dermatological ROS: negative for rash and skin lesion changes  Neurologic Examination:  Blood pressure (!) 171/78, pulse 77, temperature 98.8 F (37.1 C), temperature source Oral, resp. rate (!) 26, weight 72.7 kg (160 lb 4.4 oz), SpO2 100 %.  HEENT-  Normocephalic, no lesions, without obvious abnormality.  Normal external eye and conjunctiva.  Normal TM's bilaterally.  Normal auditory canals and external ears. Normal external nose, mucus membranes and septum.  Normal pharynx. Cardiovascular- Irregular, S1S2 Lungs: cta Abdomen: soft NT ND   Neurological Examination NIHSS4 Mental Status: Alert, oriented to self and place but not time , thought content appropriate.  Speech slurred  without evidence of aphasia.  unable to follow 3 step commandsCranial Nerves: II: Discs flat bilaterally; Visual fields grossly normal,  III,IV, VI: ptosis not present, extra-ocular motions intact bilaterally, pupils equal, round, reactive to light and accommodation R facial droop   VIII: hearing normal  bilaterally IX,X: uvula rises symmetrically XI: bilateral shoulder shrug XII: midline tongue extension Motor: Right : Upper extremity   5/5    Left:     Upper extremity   5/5  Lower extremity   5/5     Lower extremity   5/5 Tone and bulk:normal tone throughout; no atrophy noted Sensory: Pinprick and light touch intact throughout, bilaterally Deep Tendon Reflexes: 2+ and symmetric throughout Plantars: Right: downgoing   Left: downgoing Cerebellar: normal finger-to-nose, normal rapid alternating movements and normal heel-to-shin test        Lab Results: Basic Metabolic Panel:  Recent Labs Lab 02/15/17 1917  NA 142  K 3.1*  CL 106  GLUCOSE 153*  BUN 20  CREATININE 1.10    Liver Function Tests: No results for input(s): AST, ALT, ALKPHOS, BILITOT, PROT, ALBUMIN in the last 168 hours. No results for input(s): LIPASE, AMYLASE in the last 168 hours. No results for input(s): AMMONIA in the last 168 hours.  CBC:  Recent Labs Lab 02/15/17 1911 02/15/17 1917  WBC 7.6  --   NEUTROABS 5.2  --   HGB 12.7* 13.3  HCT 39.8 39.0  MCV 88.8  --   PLT 267  --     Cardiac Enzymes: No results for input(s): CKTOTAL, CKMB, CKMBINDEX, TROPONINI in the last 168 hours.  Lipid Panel: No results for input(s): CHOL, TRIG, HDL, CHOLHDL, VLDL, LDLCALC in the last 168 hours.  CBG:  Recent Labs Lab 02/15/17 1911  GLUCAP 150*    Microbiology: Results for orders placed or performed in visit on 02/12/17  Urine Culture     Status: None   Collection Time: 02/12/17  4:04 PM  Result Value Ref Range Status   Culture ENTEROBACTER CLOACAE  Final   Colony Count Greater than 100,000 CFU/mL  Final   Organism ID, Bacteria ENTEROBACTER CLOACAE  Final      Susceptibility   Enterobacter cloacae -  (no method available)    AMOX/CLAVULANIC >=32 Resistant     PIP/TAZO 8 Sensitive     IMIPENEM 0.5 Sensitive     CEFAZOLIN >=64 Resistant     CEFTRIAXONE <=1 Sensitive     CEFTAZIDIME <=1  Sensitive     CEFEPIME <=1 Sensitive     GENTAMICIN <=1 Sensitive     TOBRAMYCIN <=1 Sensitive     CIPROFLOXACIN <=0.25 Sensitive     LEVOFLOXACIN <=0.12 Sensitive     NITROFURANTOIN 64 Intermediate     TRIMETH/SULFA* <=20 Sensitive      * NR=NOT REPORTABLE,SEE COMMENTORAL therapy:A cefazolin MIC of <32 predicts susceptibility to the oral agents cefaclor,cefdinir,cefpodoxime,cefprozil,cefuroxime,cephalexin,and loracarbef when used for therapy of uncomplicated UTIs due  to E.coli,K.pneumomiae,and P.mirabilis. PARENTERAL therapy: A cefazolinMIC of >8 indicates resistance to parenteralcefazolin. An alternate test method must beperformed to confirm susceptibility to parenteralcefazolin.    Coagulation Studies:  Recent Labs  02/15/17 1911  LABPROT 16.8*  INR 1.35    Imaging: Ct Head Code Stroke W/o Cm  Result Date: 02/15/2017 CLINICAL DATA:  Code stroke. Slurred speech, RIGHT facial droop. Last seen normal at 1800 hours. History of hypertension, prostate cancer, diabetes, stroke. EXAM: CT HEAD WITHOUT CONTRAST TECHNIQUE: Contiguous axial images were obtained from the base of the skull through the vertex without intravenous contrast. COMPARISON:  MRI of the head January 22, 2017 and CT HEAD January 18, 2017. FINDINGS: BRAIN: No intraparenchymal hemorrhage, mass effect nor midline shift. Mesial LEFT parietoccipital and RIGHT occipital lobe encephalomalacia. Old RIGHT cerebellar infarcts. Old RIGHT basal ganglia lacunar infarct versus lacunar infarct. Old small LEFT parietal vertex infarct. Confluent supratentorial white matter hypodensities. No midline shift, mass effect or acute large vascular territory infarct. No abnormal extra-axial fluid collections. Basal cisterns are patent. VASCULAR: Moderate to severe calcific atherosclerosis of the carotid siphons. SKULL: No skull fracture. No significant scalp soft tissue swelling. SINUSES/ORBITS: The mastoid air-cells and included paranasal sinuses are  well-aerated.The included ocular globes and orbital contents are non-suspicious. OTHER: None. ASPECTS [facility] Stroke Program Early CT Score) - Ganglionic level infarction (caudate, lentiform nuclei, internal capsule, insula, M1-M3 cortex): 7 - Supraganglionic infarction (M4-M6 cortex): 3 Total score (0-10 with 10 being normal): 10 IMPRESSION: 1. No acute intracranial process. 2. Old bilateral PCA territory and RIGHT posterior-inferior cerebellar artery territory infarcts. Old small LEFT parietal lobe infarct. Old basal ganglia lacunar infarcts. 3. Severe chronic small vessel ischemic disease. 4. ASPECTS is 10. Critical Value/emergent results were called by telephone at the time of interpretation on 02/15/2017 at 7:30 pm to Dr. Paschal Dopp, Neurology, who verbally acknowledged these results. Electronically Signed   By: Elon Alas M.D.   On: 02/15/2017 19:34         Assessment: 83 y.o. male with complicated PMH significant for multiple prior strokes, dementia, HTN, CHF EF40% and afib on Eliquis ( last dose today) who presented with acute onsent of slurred speech and R facial droop started at 6 pm today and witnessed by family, CTH showed no acute changes, CTA was cancelled per family request since they did not want to proceed with any surgical or interventional measures if needed due to patient age and functional status.   - Continue Eliquis, ASA and statin -MRI brain, MRA head and neck -PT/OT and speech eval -UTI treatment -A1C, Lipide profile -GI/DVT prophylaxis   Arnaldo Natal, MD

## 2017-02-15 NOTE — ED Triage Notes (Signed)
Per EMS pt family noticed right-sided facial droop and slurred speech on their arrival at his nursing home approximately 1810 this evening. Pt alert and oriented per baseline. Hx of dementia.  Pt is on eliquis and does have history of previous strokes in the past with generalized weakness.  Per family pt has history of mesothelioma and has dementia but only new symptoms noted to them tonight are the slurred speech and right-sided facial droop.  Recent diagnosis of UTI for which the pt is on antibiotics.

## 2017-02-15 NOTE — ED Provider Notes (Signed)
[facility] DEPT Provider Note   CSN: [id number] Arrival date & time: 02/15/17  2620   An emergency department physician performed an initial assessment on this suspected stroke patient at 65.  History   Chief Complaint Chief Complaint  Patient presents with  . Code Stroke    HPI Thomas [name] is a 83 y.o. male.  Patient started today with slurred speech and left facial drooping.   The history is provided by the patient. No language interpreter was used.  Cerebrovascular Accident  This is a new problem. The current episode started 1 to 2 hours ago. The problem occurs constantly. The problem has not changed since onset.Pertinent negatives include no chest pain, no abdominal pain and no headaches. Nothing aggravates the symptoms. Nothing relieves the symptoms. He has tried nothing for the symptoms. The treatment provided no relief.    Past Medical History:  Diagnosis Date  . Adenomatous colon polyp 2009  . Allergy   . Carotid artery occlusion   . Diverticulosis of colon (without mention of hemorrhage)   . ED (erectile dysfunction)   . Esophageal stricture   . Esophagitis   . GERD (gastroesophageal reflux disease)   . Goals of care, counseling/discussion 12/23/2016  . Hiatal hernia   . Hypertension   . Prostate cancer ([place])   . Unspecified gastritis and gastroduodenitis without mention of hemorrhage     Patient Active Problem List   Diagnosis Date Noted  . Acute CVA (cerebrovascular accident) ([place]) 02/15/2017  . History of CVA (cerebrovascular accident) 02/15/2017  . DNR (do not resuscitate) 02/15/2017  . Facial droop 02/15/2017  . Slurred speech 02/15/2017  . Memory loss due to medical condition 02/12/2017  . Acute blood loss anemia   . Vertigo following cerebrovascular accident 12/16/2016  . Type 2 diabetes mellitus with hyperlipidemia ([place]) 12/16/2016  . Insomnia 12/07/2016  . Alcohol abuse 08/04/2016  . Permanent atrial fibrillation ([place]) 07/21/2016  .  Malignant mesothelioma of pleura ([place]) 02/16/2016  . Perforation of right tympanic membrane 11/24/2013  . Carotid stenosis 02/21/2013  . Intracranial vascular stenosis 02/17/2013  . GERD (gastroesophageal reflux disease) 05/17/2011  . Dysphagia 05/17/2011  . Stricture and stenosis of esophagus 08/29/2010  . Allergic rhinitis 01/16/2008  . 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 PROSTATE CANCER, HX OF 03/26/2007  . Hyperlipidemia 03/20/2007  . Essential hypertension 03/20/2007    Past Surgical History:  Procedure Laterality Date  . ENDARTERECTOMY Left 03/05/2013   Procedure: ENDARTERECTOMY CAROTID;  Surgeon: Conrad Lewis Run, MD;  Location: [facility];  Service: Vascular;  Laterality: Left;  . ESOPHAGOGASTRODUODENOSCOPY (EGD) WITH ESOPHAGEAL DILATION    . PROSTATE SURGERY     Laser surgery       Home Medications    Prior to Admission medications   Medication Sig Start Date End Date Taking? Authorizing Provider  acetaminophen (TYLENOL) 325 MG tablet Take 650 mg by mouth every 6 (six) hours as needed for mild pain, fever or headache.    Yes [provider]  amLODipine (NORVASC) 5 MG tablet Take 1 tablet (5 mg total) by mouth daily. 10/16/16  Yes Almyra Deforest, PA  apixaban (ELIQUIS) 5 MG TABS tablet Take 5 mg by mouth 2 (two) times daily.   Yes [provider]  aspirin EC 81 MG EC tablet Take 1 tablet (81 mg total) by mouth daily. Patient taking differently: Take 81 mg by mouth daily as needed (for chest pain).  01/24/17  Yes , Clanford L, MD  cloNIDine (CATAPRES) 0.1 MG tablet  Take 1 tablet (0.1 mg total) by mouth 2 (two) times daily. 01/23/17  Yes Ashline, Clanford L, MD  haloperidol (HALDOL) 1 MG tablet Take 1 mg by mouth once as needed for agitation.   Yes [provider]  metoCLOPramide (REGLAN) 5 MG tablet Take 1 tablet (5 mg total) by mouth every 8 (eight) hours as needed for nausea. 12/16/16  Yes Dhungel, Nishant, MD  QUEtiapine (SEROQUEL) 25 MG tablet  Take 1 tablet (25 mg total) by mouth at bedtime. 02/12/17  Yes Marin Olp, MD  rosuvastatin (CRESTOR) 20 MG tablet Take 1 tablet (20 mg total) by mouth daily. 01/23/17  Yes Kolton, Clanford L, MD  cephALEXin (KEFLEX) 500 MG capsule Take 1 capsule (500 mg total) by mouth 2 (two) times daily. 02/15/17   Marin Olp, MD  LORazepam (ATIVAN) 0.5 MG tablet Take 1 tablet (0.5 mg total) by mouth every 8 (eight) hours as needed for anxiety, sedation or sleep. Patient taking differently: Take 0.5 mg by mouth at bedtime.  02/02/17   Marin Olp, MD    Family History Family History  Problem Relation Age of Onset  . Heart disease Father        unclear specifics  . Cancer Mother   . Breast cancer Sister   . Hyperlipidemia Daughter   . Hypertension Daughter   . Hypertension Son     Social History Social History  Substance Use Topics  . Smoking status: Former Smoker    Packs/day: 0.50    Years: 4.00    Types: Cigarettes    Quit date: 12/21/1953  . Smokeless tobacco: Never Used  . Alcohol use 0.0 oz/week     Comment: daily--one alcohol drink a day     Allergies   Penicillins and Ace inhibitors   Review of Systems Review of Systems  Constitutional: Negative for appetite change and fatigue.  HENT: Negative for congestion, ear discharge and sinus pressure.   Eyes: Negative for discharge.  Respiratory: Negative for cough.   Cardiovascular: Negative for chest pain.  Gastrointestinal: Negative for abdominal pain and diarrhea.  Genitourinary: Negative for frequency and hematuria.  Musculoskeletal: Negative for back pain.  Skin: Negative for rash.  Neurological: Negative for seizures and headaches.       Slurred speech  Psychiatric/Behavioral: Negative for hallucinations.     Physical Exam Updated Vital Signs BP (!) 179/104 (BP Location: Right Arm)   Pulse 82   Temp 98.4 F (36.9 C) (Oral)   Resp 20   Ht 5\' 11"  (1.803 m)   Wt 74 kg (163 lb 3.2 oz)   SpO2 98%   BMI  22.76 kg/m   Physical Exam  Constitutional: He is oriented to person, place, and time. He appears well-developed.  HENT:  Head: Normocephalic.  Eyes: Conjunctivae and EOM are normal. No scleral icterus.  Neck: Neck supple. No thyromegaly present.  Cardiovascular: Normal rate and regular rhythm.  Exam reveals no gallop and no friction rub.   No murmur heard. Pulmonary/Chest: No stridor. He has no wheezes. He has no rales. He exhibits no tenderness.  Abdominal: He exhibits no distension. There is no tenderness. There is no rebound.  Musculoskeletal: Normal range of motion. He exhibits no edema.  Lymphadenopathy:    He has no cervical adenopathy.  Neurological: He is oriented to person, place, and time. He exhibits normal muscle tone. Coordination normal.  Slurred speech and facial drooping on the right  Skin: No rash noted. No erythema.  Psychiatric: He  has a normal mood and affect. His behavior is normal.     ED Treatments / Results  Labs (all labs ordered are listed, but only abnormal results are displayed) Labs Reviewed  PROTIME-INR - Abnormal; Notable for the following:       Result Value   Prothrombin Time 16.8 (*)    All other components within normal limits  APTT - Abnormal; Notable for the following:    aPTT 41 (*)    All other components within normal limits  CBC - Abnormal; Notable for the following:    Hemoglobin 12.7 (*)    RDW 17.0 (*)    All other components within normal limits  DIFFERENTIAL - Abnormal; Notable for the following:    Monocytes Absolute 1.2 (*)    All other components within normal limits  COMPREHENSIVE METABOLIC PANEL - Abnormal; Notable for the following:    Potassium 3.0 (*)    CO2 16 (*)    Glucose, Bld 153 (*)    Creatinine, Ser 1.31 (*)    Albumin 3.4 (*)    ALT 14 (*)    GFR calc non Af Amer 47 (*)    GFR calc Af Amer 54 (*)    All other components within normal limits  URINALYSIS, ROUTINE W REFLEX MICROSCOPIC - Abnormal; Notable  for the following:    APPearance HAZY (*)    Hgb urine dipstick SMALL (*)    Ketones, ur 5 (*)    Protein, ur 30 (*)    Nitrite POSITIVE (*)    Leukocytes, UA LARGE (*)    Bacteria, UA MANY (*)    Squamous Epithelial / LPF 0-5 (*)    All other components within normal limits  CBG MONITORING, ED - Abnormal; Notable for the following:    Glucose-Capillary 150 (*)    All other components within normal limits  I-STAT CHEM 8, ED - Abnormal; Notable for the following:    Potassium 3.1 (*)    Glucose, Bld 153 (*)    Calcium, Ion 1.06 (*)    All other components within normal limits  HEMOGLOBIN A1C  LIPID PANEL  I-STAT TROPOININ, ED    EKG  EKG Interpretation None       Radiology Ct Head Code Stroke W/o Cm  Result Date: 02/15/2017 CLINICAL DATA:  Code stroke. Slurred speech, RIGHT facial droop. Last seen normal at 1800 hours. History of hypertension, prostate cancer, diabetes, stroke. EXAM: CT HEAD WITHOUT CONTRAST TECHNIQUE: Contiguous axial images were obtained from the base of the skull through the vertex without intravenous contrast. COMPARISON:  MRI of the head January 22, 2017 and CT HEAD January 18, 2017. FINDINGS: BRAIN: No intraparenchymal hemorrhage, mass effect nor midline shift. Mesial LEFT parietoccipital and RIGHT occipital lobe encephalomalacia. Old RIGHT cerebellar infarcts. Old RIGHT basal ganglia lacunar infarct versus lacunar infarct. Old small LEFT parietal vertex infarct. Confluent supratentorial white matter hypodensities. No midline shift, mass effect or acute large vascular territory infarct. No abnormal extra-axial fluid collections. Basal cisterns are patent. VASCULAR: Moderate to severe calcific atherosclerosis of the carotid siphons. SKULL: No skull fracture. No significant scalp soft tissue swelling. SINUSES/ORBITS: The mastoid air-cells and included paranasal sinuses are well-aerated.The included ocular globes and orbital contents are non-suspicious. OTHER: None.  ASPECTS [facility] Stroke Program Early CT Score) - Ganglionic level infarction (caudate, lentiform nuclei, internal capsule, insula, M1-M3 cortex): 7 - Supraganglionic infarction (M4-M6 cortex): 3 Total score (0-10 with 10 being normal): 10 IMPRESSION: 1. No acute intracranial process. 2.  Old bilateral PCA territory and RIGHT posterior-inferior cerebellar artery territory infarcts. Old small LEFT parietal lobe infarct. Old basal ganglia lacunar infarcts. 3. Severe chronic small vessel ischemic disease. 4. ASPECTS is 10. Critical Value/emergent results were called by telephone at the time of interpretation on 02/15/2017 at 7:30 pm to Dr. Paschal Dopp, Neurology, who verbally acknowledged these results. Electronically Signed   By: Elon Alas M.D.   On: 02/15/2017 19:34    Procedures Procedures (including critical care time)  Medications Ordered in ED Medications  cephALEXin (KEFLEX) capsule 500 mg (not administered)  QUEtiapine (SEROQUEL) tablet 25 mg (not administered)  aspirin EC tablet 81 mg (not administered)  cloNIDine (CATAPRES) tablet 0.1 mg (not administered)  rosuvastatin (CRESTOR) tablet 20 mg (not administered)  apixaban (ELIQUIS) tablet 5 mg (not administered)  amLODipine (NORVASC) tablet 5 mg (not administered)   stroke: mapping our early stages of recovery book (not administered)  0.9 %  sodium chloride infusion (not administered)  acetaminophen (TYLENOL) tablet 650 mg (not administered)    Or  acetaminophen (TYLENOL) solution 650 mg (not administered)    Or  acetaminophen (TYLENOL) suppository 650 mg (not administered)  senna-docusate (Senokot-S) tablet 1 tablet (not administered)     Initial Impression / Assessment and Plan / ED Course  I have reviewed the triage vital signs and the nursing notes.  Pertinent labs & imaging results that were available during my care of the patient were reviewed by me and considered in my medical decision making (see chart for  details). CRITICAL CARE Performed by: Jaylene Schrom L Total critical care time: 35 minutes Critical care time was exclusive of separately billable procedures and treating other patients. Critical care was necessary to treat or prevent imminent or life-threatening deterioration. Critical care was time spent personally by me on the following activities: development of treatment plan with patient and/or surrogate as well as nursing, discussions with consultants, evaluation of patient's response to treatment, examination of patient, obtaining history from patient or surrogate, ordering and performing treatments and interventions, ordering and review of laboratory studies, ordering and review of radiographic studies, pulse oximetry and re-evaluation of patient's condition.     Patient was seen by neurology. Family and patient did not want any aggressive treatment done for the stroke. Patient will be admitted to medicine  Final Clinical Impressions(s) / ED Diagnoses   Final diagnoses:  Acute ischemic stroke [facility])    New Prescriptions Current Discharge Medication List       Milton Ferguson, MD 02/15/17 2311

## 2017-02-15 NOTE — ED Notes (Signed)
After consultation with Dr. Marylou Mccoy pt's family does not wish to pursue aggressive tx at this time therefore CT angio was cancelled.

## 2017-02-15 NOTE — ED Notes (Signed)
Bed being cleaned per floor

## 2017-02-15 NOTE — Telephone Encounter (Signed)
Pt daughter is aware seroquel was denied. Pt daughter would like something else call into [facility] [address]. The seroquel is working and pt can not afford to pay for med. Pt daughter is aware md out of office until monday

## 2017-02-15 NOTE — H&P (Addendum)
[facility] History and Physical  Arieh Bogue MEQ:[id number] DOB: 23-Mar-1926 DOA: 02/15/2017  Referring physician: Dr Roderic Palau PCP: Marin Olp, MD   Chief Complaint: Slurred speech  HPI: Thomas [name] is a 83 y.o. male with hx of dementia, HTN, prior CVA's presented to ED with facial droop R side and slurred speech onset today at his nursing home around 6:10 pm.  Pt on Eliquis, recent UTI on abx.  Seen by neuro in ED , d/w family and they did not want to pursue aggressive care (CTA, intervention) and per neurology plan is for medical Rx. EKG shows atrial fib. We are asked to see for admission.    [name] provides most of history.  Pt has had 5 or 6 prior strokes including one in May with R facial droop which since then has improved.  Now he has return of the R facial droop and slurred speech. Just got home from [facility] rehab 1-2 weeks ago.  At baseline pt walks, feeds himself, uses a walker and is not 24/7 attention after recent CVA.  He drinks nectar thick liquids but no problems with solids.  Gets help w/ bathing and meds from a daughter.   He lives w/ his wife (hx severe CHF and CKD) and their youngest son. Many family members rotate to help out with his care.  Former smoker, +etoh 1 drink per day.  He has mesothelioma which "collapsed one of his lungs".  He has dementia worse since last CVA in May.  He is DNR per dtr.  He has "UTI" and was given AB Rx which hasn't been filled yet.  Had prostate Ca treated w/ seed implants.    Chart Jul 17 - left carotid endarterectomy  May 2- 5, 2018  - acute occipital CVA, HTN, perm afib; per neuro stroke was due to embolism of post inf cerebral artery d/t Afib. Went to CIR 5/29 > 6.7 then readmitted 6/7 for new acute stroke ( R midbrain/ R thalamic felt to be due to [place], BA, and PCA stenosis). Neuro consult added ECASA 91 mg to plavix.  DC'd to SNF on 6/12.     ROS  denies CP  no joint pain   no HA  no blurry vision  no rash  no  diarrhea  no nausea/ vomiting  no dysuria  no difficulty voiding  no change in urine color    Past Medical History  Past Medical History:  Diagnosis Date  . Adenomatous colon polyp 2009  . Allergy   . Carotid artery occlusion   . Diverticulosis of colon (without mention of hemorrhage)   . ED (erectile dysfunction)   . Esophageal stricture   . Esophagitis   . GERD (gastroesophageal reflux disease)   . Goals of care, counseling/discussion 12/23/2016  . Hiatal hernia   . Hypertension   . Prostate cancer ([place])   . Unspecified gastritis and gastroduodenitis without mention of hemorrhage    Past Surgical History  Past Surgical History:  Procedure Laterality Date  . ENDARTERECTOMY Left 03/05/2013   Procedure: ENDARTERECTOMY CAROTID;  Surgeon: Conrad Navesink, MD;  Location: [facility];  Service: Vascular;  Laterality: Left;  . ESOPHAGOGASTRODUODENOSCOPY (EGD) WITH ESOPHAGEAL DILATION    . PROSTATE SURGERY     Laser surgery   Family History  Family History  Problem Relation Age of Onset  . Heart disease Father        unclear specifics  . Cancer Mother   . Breast cancer Sister   .  Hyperlipidemia Daughter   . Hypertension Daughter   . Hypertension Son    Social History  reports that he quit smoking about 63 years ago. His smoking use included Cigarettes. He has a 2.00 pack-year smoking history. He has never used smokeless tobacco. He reports that he drinks alcohol. He reports that he does not use drugs. Allergies  Allergies  Allergen Reactions  . Ace Inhibitors Hives  . Penicillins Swelling    Swelling  of the face. Has patient had a PCN reaction causing immediate rash, facial/tongue/throat swelling, SOB or lightheadedness with hypotension: YES Has patient had a PCN reaction causing severe rash involving mucus membranes or skin necrosis: NO Has patient had a PCN reaction that required hospitalizationNO Has patient had a PCN reaction occurring within the last 10 years: NO If all of  the above answers are "NO", then may proceed with Cephalosporin use.   Home medications Prior to Admission medications   Medication Sig Start Date End Date Taking? Authorizing Provider  amLODipine (NORVASC) 5 MG tablet Take 1 tablet (5 mg total) by mouth daily. 10/16/16   Almyra Deforest, PA  apixaban (ELIQUIS) 5 MG TABS tablet Take 5 mg by mouth 2 (two) times daily.    [provider]  aspirin EC 81 MG EC tablet Take 1 tablet (81 mg total) by mouth daily. 01/24/17   Fawley, Clanford L, MD  cephALEXin (KEFLEX) 500 MG capsule Take 1 capsule (500 mg total) by mouth 2 (two) times daily. 02/15/17   Marin Olp, MD  cloNIDine (CATAPRES) 0.1 MG tablet Take 1 tablet (0.1 mg total) by mouth 2 (two) times daily. 01/23/17   Sferrazza, Clanford L, MD  LORazepam (ATIVAN) 0.5 MG tablet Take 1 tablet (0.5 mg total) by mouth every 8 (eight) hours as needed for anxiety, sedation or sleep. 02/02/17   Marin Olp, MD  metoCLOPramide (REGLAN) 5 MG tablet Take 1 tablet (5 mg total) by mouth every 8 (eight) hours as needed for nausea. 12/16/16   Dhungel, Nishant, MD  QUEtiapine (SEROQUEL) 25 MG tablet Take 1 tablet (25 mg total) by mouth at bedtime. 02/12/17   Marin Olp, MD  rosuvastatin (CRESTOR) 20 MG tablet Take 1 tablet (20 mg total) by mouth daily. 01/23/17   Murlean Iba, MD   Liver Function Tests  Recent Labs Lab 02/15/17 1911  AST 22  ALT 14*  ALKPHOS 80  BILITOT 0.8  PROT 7.8  ALBUMIN 3.4*   No results for input(s): LIPASE, AMYLASE in the last 168 hours. CBC  Recent Labs Lab 02/15/17 1911 02/15/17 1917  WBC 7.6  --   NEUTROABS 5.2  --   HGB 12.7* 13.3  HCT 39.8 39.0  MCV 88.8  --   PLT 267  --    Basic Metabolic Panel  Recent Labs Lab 02/15/17 1911 02/15/17 1917  NA 136 142  K 3.0* 3.1*  CL 105 106  CO2 16*  --   GLUCOSE 153* 153*  BUN 17 20  CREATININE 1.31* 1.10  CALCIUM 9.1  --      Vitals:   02/15/17 1911 02/15/17 1927 02/15/17 1930 02/15/17 1945   BP: (!) 173/107 (!) 171/78 (!) 160/91 (!) 156/84  Pulse:  77 82 81  Resp: 18 (!) 26 18 (!) 21  Temp:  98.8 F (37.1 C)    TempSrc:  Oral    SpO2: 98% 100% 100% 99%  Weight:       Exam: Gen elderly AAM w/ obvious R  facial droop , also has loss of forehead wrinkles and dysarthria but not aphasia No rash, cyanosis or gangrene Sclera anicteric, throat clear No jvd or bruits Chest clear bilat RRR no MRG Abd soft ntnd no mass or ascites +bs GU normal male MS no joint effusions or deformity Ext no LE edema / no wounds or ulcers Neuro is alert, obvious R facial droop, also no movement of R forehead muscles No focal arm or leg weakness, no arm drift, normal cerebellar testing.   Alert and Ox 2.  Responds verbally but very hard to understand d/ t dysarthria.     Home meds > norvasc, eliquis, asa, keflex, clonidine, ativan, reglan, seroquel, crestor  Na 136  K 3.0  BUN 17  Cr 1.31  Ca 9.1  Alb 3.4  LFT's ok  eGFR 54 WBC 7k   Hb 12.7  plt 267 INR 1.3  PTT 41  Glu 153   EKG > afib HR 80's  Head CT > Impression 1. No acute intracranial process. 2. Old bilateral PCA territory and RIGHT posterior-inferior cerebellar artery territory infarcts. Old small LEFT parietal lobe infarct. Old basal ganglia lacunar infarcts. 3. Severe chronic small vessel ischemic disease. 4. ASPECTS is 10.   Assess: 1  Acute CVA - with new / recurrent R facial droop , similar to recent CVA per family.  Pt has known permanent afib and Eliquis and ASA, also CM w/ EF 40%.  No aggressive intervention per family.  Pt has dementia and DNR order.  Not sure that extensive w/u would change Rx, will defer further testing to neurology and continue on Eliquis and ASA for now.  2 UTI - per OP MD, given Rx for Keflex, but per pharmacy has hx anaphylaxis to PCN and no exposure here to cephalosporins; will hold abx and check UA 3 Permanent afib 4 Hx mesothelioma w/ recurrent large L effusion 5  Hx prostate Ca 6  Dementia 7   DNR 8  ETOH abuse 9  HTN - cont norvasc/ clonidine  Plan - as above      [place] D [facility] Pager [phone]   If 7PM-7AM, please contact night-coverage [email or web address] Password [email or web address] 02/15/2017, 8:14 PM

## 2017-02-15 NOTE — Telephone Encounter (Signed)
Sent Rx in for Keflex for patient due to having a UTI. Noticed the allergic reaction patient has with penicillin. Called and spoke with patient's daughter. She said that he gets a rash/swelling and medication makes him sick. Per Dr. Yong Channel, okay to send Rx for Keflex into pharmacy. Called and let patient's daughter know that Rx has been sent into the [facility] on [address].

## 2017-02-16 ENCOUNTER — Inpatient Hospital Stay (HOSPITAL_COMMUNITY): Payer: Medicare Other

## 2017-02-16 DIAGNOSIS — I651 Occlusion and stenosis of basilar artery: Secondary | ICD-10-CM

## 2017-02-16 DIAGNOSIS — I639 Cerebral infarction, unspecified: Secondary | ICD-10-CM

## 2017-02-16 DIAGNOSIS — I63539 Cerebral infarction due to unspecified occlusion or stenosis of unspecified posterior cerebral artery: Secondary | ICD-10-CM

## 2017-02-16 LAB — LIPID PANEL
CHOL/HDL RATIO: 5.2 ratio
CHOLESTEROL: 134 mg/dL (ref 0–200)
HDL: 26 mg/dL — ABNORMAL LOW (ref >40)
LDL Cholesterol: 91 mg/dL (ref 0–99)
TRIGLYCERIDES: 85 mg/dL (ref <150)
VLDL: 17 mg/dL (ref 0–40)

## 2017-02-16 MED ORDER — SODIUM CHLORIDE 0.9 % IV SOLN
INTRAVENOUS | Status: DC
  Administered 2017-02-16 – 2017-02-18 (×4): via INTRAVENOUS
  Administered 2017-02-19: 1000 mL via INTRAVENOUS

## 2017-02-16 MED ORDER — LEVOFLOXACIN IN D5W 250 MG/50ML IV SOLN
250.0000 mg | INTRAVENOUS | Status: DC
  Administered 2017-02-16 – 2017-02-17 (×2): 250 mg via INTRAVENOUS
  Filled 2017-02-16 (×3): qty 50

## 2017-02-16 MED ORDER — POTASSIUM CHLORIDE 10 MEQ/100ML IV SOLN
10.0000 meq | INTRAVENOUS | Status: AC
  Administered 2017-02-16 (×4): 10 meq via INTRAVENOUS
  Filled 2017-02-16 (×4): qty 100

## 2017-02-16 MED ORDER — HYDRALAZINE HCL 20 MG/ML IJ SOLN
5.0000 mg | INTRAMUSCULAR | Status: DC | PRN
  Administered 2017-02-16: 5 mg via INTRAVENOUS
  Filled 2017-02-16: qty 1

## 2017-02-16 NOTE — Progress Notes (Addendum)
STROKE TEAM PROGRESS NOTE   HISTORY OF PRESENT ILLNESS (per record) Thomas [name] is an 83 y.o. male with a history of 5+ prior strokes, afib on Eliquis, mesothelioma with lung metastasis, history of prostate cancer, s/p left CEA, CAD, dementia, HTN, and HFrEF (EF40%), who presented with acute onsent of slurred speech and right facial droop started at 6 pm on 02/15/2017.  His symptoms were witnessed by family.  The patient was also recently diagnosed with a UTI and was started on antibiotics at [facility] on 02/15/2017.  Initial CT head with no acute changes.  The neurohospitalist reviewed treatment options with family, explaining he was not a candidate for tPA due to anticoagulation with Eliquis.  Family declined CTA/thrombectomy in favor of medical management of acute stroke due to patient age and medical issues.  The patient was recently discharged from [facility].   Date last known well: 02/15/17 Time last known well: 6 pm tPA Given:no, on Eliquis  Modified Rankin:3-4  Patient was not administered IV t-PA secondary to current anticoagulation with Eliquis. He was admitted to General Neurology for further evaluation and treatment.  SUBJECTIVE (INTERVAL HISTORY) His eldest son is at the bedside.  The patient is awake and alert and follows all commands appropriately.  RL motor neuron/peripheral 7th pasly and right abduction difficulty with nystagmus noted on exam.  Pt was diagnosed with pleural mesothelioma in 11/2016 and currently undergoing palliative care.    OBJECTIVE Temp:  [98 F (36.7 C)-98.8 F (37.1 C)] 98.8 F (37.1 C) (07/06 1400) Pulse Rate:  [72-84] 74 (07/06 1400) Cardiac Rhythm: Atrial fibrillation (07/06 1259) Resp:  [16-28] 16 (07/06 1154) BP: (152-210)/(75-110) 152/75 (07/06 1400) SpO2:  [97 %-100 %] 100 % (07/06 1400) Weight:  [72.7 kg (160 lb 4.4 oz)-74 kg (163 lb 3.2 oz)] 74 kg (163 lb 3.2 oz) (07/05 2250)  CBC:   Recent Labs Lab 02/15/17 1911  02/15/17 1917  WBC 7.6  --   NEUTROABS 5.2  --   HGB 12.7* 13.3  HCT 39.8 39.0  MCV 88.8  --   PLT 267  --     Basic Metabolic Panel:   Recent Labs Lab 02/15/17 1911 02/15/17 1917  NA 136 142  K 3.0* 3.1*  CL 105 106  CO2 16*  --   GLUCOSE 153* 153*  BUN 17 20  CREATININE 1.31* 1.10  CALCIUM 9.1  --     Lipid Panel:     Component Value Date/Time   CHOL 134 02/16/2017 0515   TRIG 85 02/16/2017 0515   HDL 26 (L) 02/16/2017 0515   CHOLHDL 5.2 02/16/2017 0515   VLDL 17 02/16/2017 0515   LDLCALC 91 02/16/2017 0515   HgbA1c:  Lab Results  Component Value Date   HGBA1C 6.9 (H) 01/06/2017   Urine Drug Screen:     Component Value Date/Time   LABOPIA NONE DETECTED 01/05/2017 1920   COCAINSCRNUR NONE DETECTED 01/05/2017 1920   LABBENZ NONE DETECTED 01/05/2017 1920   AMPHETMU NONE DETECTED 01/05/2017 1920   THCU NONE DETECTED 01/05/2017 1920   LABBARB NONE DETECTED 01/05/2017 1920    Alcohol Level     Component Value Date/Time   ETH <5 01/05/2017 1226    IMAGING I have personally reviewed the radiological images below and agree with the radiology interpretations.  Dg Chest 2 View 02/16/2017 IMPRESSION: Stable complete opacification of the left hemithorax, stable.  Mr Brain 77 Contrast Mr Jodene Nam Head/brain Wo Cm 02/16/2017 IMPRESSION: 1. Continued progression of  severe vertebrobasilar system stenosis and loss of basilar artery flow since the Head and neck CTA on 12/31/2016. Lack of bilateral PCA flow signal today. 2. Acute on subacute posterior circulation infarcts with fairly extensive new involvement of the right cerebellum, and multifocal new involvement of the brainstem. 3. No associated hemorrhage or mass effect. 4. The anterior circulation is stable [compared to] the 12/14/2016 MRA, with up to moderate left and severe right ICA siphon stenosis.  Ct Head Code Stroke W/o Cm 02/15/2017 IMPRESSION: 1. No acute intracranial process. 2. Old bilateral PCA territory and  RIGHT posterior-inferior cerebellar artery territory infarcts. Old small LEFT parietal lobe infarct. Old basal ganglia lacunar infarcts. 3. Severe chronic small vessel ischemic disease. 4. ASPECTS is 10.    PHYSICAL EXAM  Temp:  [98 F (36.7 C)-98.8 F (37.1 C)] 98.8 F (37.1 C) (07/06 1400) Pulse Rate:  [72-84] 74 (07/06 1400) Resp:  [16-28] 16 (07/06 1154) BP: (152-210)/(75-110) 152/75 (07/06 1400) SpO2:  [97 %-100 %] 100 % (07/06 1400) Weight:  [160 lb 4.4 oz (72.7 kg)-163 lb 3.2 oz (74 kg)] 163 lb 3.2 oz (74 kg) (07/05 2250)  General - Well nourished, well developed, in no apparent distress.  Ophthalmologic - Fundi not visualized due to noncooperation.  Cardiovascular - Regular rate and rhythm.  Mental Status -  Level of arousal and orientation to time, place, and person were intact. Language including expression, naming, repetition, comprehension was assessed and found intact, but severe dysarthria  Cranial Nerves II - XII - II - Visual field grossly intact OU. III, IV, VI - Extraocular movements showed right CN VI partial palsy, with right eye abduction, nystagmus is noted. V - Facial sensation intact bilaterally. VII - right peripheral CN VII palsy VIII - Hearing & vestibular intact bilaterally, right gaze nystagmus. X - Palate elevates symmetrically, severe dysarthria. XI - Chin turning & shoulder shrug intact bilaterally. XII - Tongue protrusion slightly to the right.  Motor Strength - The patient's strength was normal in all extremities and pronator drift was absent.  Bulk was normal and fasciculations were absent.   Motor Tone - Muscle tone was assessed at the neck and appendages and was normal.  Reflexes - The patient's reflexes were 1+ in all extremities and he had no pathological reflexes.  Sensory - Light touch, temperature/pinprick were assessed and were symmetrical.    Coordination - The patient had right UE ataxia, left UE intact, BLE not cooperative on  test.  Tremor was absent.  Gait and Station - not able to test.   ASSESSMENT/PLAN [name] is a 83 y.o. male with history of 5+ prior strokes, afib on Eliquis, mesothelioma with lung metastasis, history of prostate cancer, s/p left CEA, CAD, dementia, HTN, and HFrEF (EF40%) presenting with acute onsent of slurred speech and right facial droop started. He did not receive IV t-PA due to current anticoagulation with Eliquis.   Stroke: acute right cerebellar, b/l pontine, left PCA and right lateral medullary infarcts, due to progression of severe vertebrobasilar system stenosis and loss of BA and PCA flow since 12/31/2016  Resultant  Right ataxia, right CN VII and VI palsy, PPRF involvement too  CT head: Old bilateral PCA territory and RIGHT PICA territory infarcts. Old small LEFT parietal lobe infarct. Old basal ganglia lacunar infarcts.   MRI head - Acute on subacute posterior circulation infarcts with fairly extensive new involvement of the right cerebellum, and multifocal new involvement of the brainstem  MRA head: progression of severe vertebrobasilar  system stenosis and loss of basilar artery flow since the Head and neck CTA on 12/31/2016. Lack of bilateral PCA flow signal   Carotid Doppler (12/15/2016): R ICA 1-39% stenosis, L ICA with patent CEA  2D Echo (01/07/2017): EF 50-55%. No source of embolus.  LDL 91  HgbA1c 6.9  SCDs for VTE prophylaxis Diet NPO time specified  aspirin 81 mg daily and Eliquis (apixaban) daily prior to admission, now on aspirin 81 mg daily and Eliquis (apixaban) daily. Continue both on discharge if pt and family desires.   Patient and son counseled to be compliant with his antithrombotic medications  Ongoing aggressive stroke risk factor management  Therapy recommendations:  pending  Disposition:  Pending. Due to extensive posterior infarcts and progressive severe posterior circulation occlusion, as well as recent lung cancer diagnosis, agree  with further palliative care at this time.  Progressive severe posterior circulation stenosis / occlusion  MRA head: progression of severe vertebrobasilar system stenosis and loss of basilar artery flow since the Head and neck CTA on 12/31/2016. Lack of bilateral PCA flow signal  Likely the etiology of current stroke  Continue ASA and eliquis  Avoid low BP, BP goal 130-160  Orthostatic vital pending - recommend TED hose  Hx of stroke  12/13/16 b/l occpitial acute on chronic infarcts  01/05/17 - extensive right PICA infarct - due to afib - put on eliquis, crestor  01/18/17 - severe posterior circulation stenosis (b/l PCA, BA, right VA) - infarcts at right midbrain, right thalamus - LDL 85 and A1C 6.9 - added ASA onto eliquis - also found to have orthostatic hypotension  Hypertension  Stable  Permissive hypertension (OK if < 220/120) but gradually normalize in 5-7 days  Long-term SBP goal 130-192mmHg for cerebral perfusion  Hyperlipidemia  Home meds: rosuvastatin 20mg , resumed in hospital  LDL 91, goal < 70  Continue statin at discharge  Diabetes  HgbA1c 6.9, goal < 7.0  Controlled  SSI  UTI  UA 02/15/2017 with large count of leukocytes, nitrite positive  Urine cultures pending  Started on IV levofloxacin 02/16/2017  Other Stroke Risk Factors  Advanced age  ETOH use, advised to drink no more than 2 drink(s) a day  Coronary artery disease    HFrEF  Other Active Problems  Mesothelioma diagnosed in 11/2016 - not curable - pt on palliative care now   Hospital day # 1  Neurology will sign off. Please call with questions. Recommend palliative care consult. Pt has appointment with Dr. Leta Baptist at [facility] on 03/09/17. Thanks for the consult.  Rosalin Hawking, MD PhD Stroke Neurology 02/16/2017 5:23 PM   To contact Stroke Continuity provider, please refer to [email or web address] After hours, contact General Neurology

## 2017-02-16 NOTE — Progress Notes (Signed)
PT Cancellation Note  Patient Details Name: Thomas [name] MRN: [id number] DOB: 10-08-1925   Cancelled Treatment:    Reason Eval/Treat Not Completed: Patient not medically ready. Pt currently with bedrest orders in place. PT will continue to f/u with pt and await updated activity orders.   Lena 02/16/2017, 8:36 AM

## 2017-02-16 NOTE — Evaluation (Signed)
Clinical/Bedside Swallow Evaluation Patient Details  Name: Thomas [name] MRN: [id number] Date of Birth: 29-Feb-1926  Today's Date: 02/16/2017 Time: SLP Start Time (ACUTE ONLY): 1340 SLP Stop Time (ACUTE ONLY): 1400 SLP Time Calculation (min) (ACUTE ONLY): 20 min  Past Medical History:  Past Medical History:  Diagnosis Date  . Adenomatous colon polyp 2009  . Allergy   . Carotid artery occlusion   . Diverticulosis of colon (without mention of hemorrhage)   . ED (erectile dysfunction)   . Esophageal stricture   . Esophagitis   . GERD (gastroesophageal reflux disease)   . Goals of care, counseling/discussion 12/23/2016  . Hiatal hernia   . Hypertension   . Prostate cancer ([place])   . Unspecified gastritis and gastroduodenitis without mention of hemorrhage    Past Surgical History:  Past Surgical History:  Procedure Laterality Date  . ENDARTERECTOMY Left 03/05/2013   Procedure: ENDARTERECTOMY CAROTID;  Surgeon: Conrad Trumann, MD;  Location: [facility];  Service: Vascular;  Laterality: Left;  . ESOPHAGOGASTRODUODENOSCOPY (EGD) WITH ESOPHAGEAL DILATION    . PROSTATE SURGERY     Laser surgery   HPI:  Pt is 83 yo male with known dementia, HTN, prior CVA's 5-6 episodes (on Eliquis), hx of prostate cancer treated with seed implants, recent UTI treated with ABX, presented with main concern of slurred speech, right facial droop that was noted several hours prior to admission. MRI shows acute on subacute post circulation infarcts, multifocal new involvement of the brainstem. Pt had MBS on 01/19/17 (after previous brainstem CVA) that showed silent aspriation of thin liquids before the swallow as well as concern for esophageal backflow. Pt recommended to consume dys 3 (mech soft) diet with nectar thick liquids. Honey recommended if pt cant do a chin tuck.  Per family pt needs cues for a chin tuck, but has tolerated this diet at SNF. Facial droop now present had resolved after the initial CVA.   Assessment /  Plan / Recommendation Clinical Impression  Pt demonstrates clinical signs of aspiration and severe dysphagia, worsened from baseline impairment, following second brainstem CVA in a period of one month. Delayed cough following nectar and honey concerning for late sensation of aspiration. Poor oral containment of secretions and PO. Recommend pt remain NPO excpet for meds given crushed in puree until MBS can be completed tomorrow. Family agreeable to plan.  SLP Visit Diagnosis: Dysphagia, oropharyngeal phase (R13.12)    Aspiration Risk  Severe aspiration risk    Diet Recommendation NPO except meds   Medication Administration: Crushed with puree    Other  Recommendations Oral Care Recommendations: Oral care BID   Follow up Recommendations Skilled Nursing facility      Frequency and Duration min 2x/week  2 weeks       Prognosis        Swallow Study   General HPI: Pt is 83 yo male with known dementia, HTN, prior CVA's 5-6 episodes (on Eliquis), hx of prostate cancer treated with seed implants, recent UTI treated with ABX, presented with main concern of slurred speech, right facial droop that was noted several hours prior to admission. MRI shows acute on subacute post circulation infarcts, multifocal new involvement of the brainstem. Pt had MBS on 01/19/17 (after previous brainstem CVA) that showed silent aspriation of thin liquids before the swallow as well as concern for esopahgeal backflow. Pt recommended to consume dys 3 (mech soft) diet with nectar thick liquids. Honey recommended if pt cant do a chin tuck.  Type of Study:  Bedside Swallow Evaluation Previous Swallow Assessment: see HPI Diet Prior to this Study: NPO Temperature Spikes Noted: No Respiratory Status: Room air History of Recent Intubation: No Behavior/Cognition: Alert;Cooperative;Requires cueing;Pleasant mood Oral Cavity Assessment: Excessive secretions Oral Care Completed by SLP: No Oral Cavity - Dentition: Missing  dentition;Poor condition Vision: Impaired for self-feeding Self-Feeding Abilities: Needs assist Patient Positioning: Upright in bed Baseline Vocal Quality: Wet Volitional Cough: Strong Volitional Swallow: Able to elicit    Oral/Motor/Sensory Function Overall Oral Motor/Sensory Function: Severe impairment Facial ROM: Reduced right;Suspected CN VII (facial) dysfunction Facial Symmetry: Abnormal symmetry right;Suspected CN VII (facial) dysfunction Facial Strength: Reduced right;Suspected CN VII (facial) dysfunction Facial Sensation: Reduced right Lingual ROM: Reduced right;Suspected CN XII (hypoglossal) dysfunction Lingual Symmetry: Abnormal symmetry right;Suspected CN XII (hypoglossal) dysfunction Lingual Strength: Reduced;Suspected CN XII (hypoglossal) dysfunction   Ice Chips Ice chips: Not tested   Thin Liquid Thin Liquid: Not tested    Nectar Thick Nectar Thick Liquid: Impaired Presentation: Cup;Spoon Oral Phase Impairments: Reduced labial seal Oral phase functional implications: Right anterior spillage;Right lateral sulci pocketing;Oral residue Pharyngeal Phase Impairments: Suspected delayed Swallow;Cough - Immediate   Honey Thick Honey Thick Liquid: Impaired Presentation: Cup;Spoon Oral Phase Impairments: Reduced labial seal Oral Phase Functional Implications: Oral residue;Right anterior spillage;Right lateral sulci pocketing Pharyngeal Phase Impairments: Cough - Delayed;Cough - Immediate   Puree Puree: Impaired Presentation: Spoon Pharyngeal Phase Impairments: Cough - Delayed   Solid   GO   Solid: Not tested       Herbie Baltimore, MA CCC-SLP [phone]  Lynann Beaver 02/16/2017,2:07 PM

## 2017-02-16 NOTE — Progress Notes (Signed)
Pharmacy Antibiotic Note  Thomas [name] is a 83 y.o. male admitted on 02/15/2017 with UTI.  Pharmacy has been consulted for levaquin dosing. Cr improved, WBC 7.6, afebrile. No urinary symptoms noted. Was recently treated with keflex for UTI. Urine culture growing enterobacter resistant to augmentin and ancef. Start levaquin.  Plan: Levaquin 250mg  IV q24h Monitor renal function, length of therapy, ability to de-escalate  Height: 5\' 11"  (180.3 cm) Weight: 163 lb 3.2 oz (74 kg) IBW/kg (Calculated) : 75.3  Temp (24hrs), Avg:98.5 F (36.9 C), Min:98 F (36.7 C), Max:98.8 F (37.1 C)   Recent Labs Lab 02/15/17 1911 02/15/17 1917  WBC 7.6  --   CREATININE 1.31* 1.10    Estimated Creatinine Clearance: 47.7 mL/min (by C-G formula based on SCr of 1.1 mg/dL).    Allergies  Allergen Reactions  . Penicillins Anaphylaxis and Swelling    THROAT SWELLS Has patient had a PCN reaction causing immediate rash, facial/tongue/throat swelling, SOB or lightheadedness with hypotension: Yes Has patient had a PCN reaction causing severe rash involving mucus membranes or skin necrosis: No Has patient had a PCN reaction that required hospitalization: No Has patient had a PCN reaction occurring within the last 10 years: No If all of the above answers are "NO", then may proceed with Cephalosporin use.   . Ace Inhibitors Hives    Antimicrobials this admission: 7/6 Levaquin >>  Dose adjustments this admission:  Microbiology results: 7/6 UCx: Enterobacter clocae   Thank you for allowing pharmacy to be a part of this patient's care.  Dierdre Harness, PharmD Clinical Pharmacist [phone] (Pager) 02/16/2017 2:33 PM

## 2017-02-16 NOTE — Progress Notes (Signed)
Patient family refusing MRI at this time as patient very tired and they do not want aggressive treatment, asking to proceed with scans tomorrow during day.  Continue to monitor patient.

## 2017-02-16 NOTE — Evaluation (Signed)
Physical Therapy Evaluation Patient Details Name: Thomas [name] MRN: [id number] DOB: Sep 20, 1925 Today's Date: 02/16/2017   History of Present Illness  Pt is an 83 y/o male admitted secondary to slurred speech and facial droop. 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 PMH including but not limited to dementia, HTN and previous CVAs.  Clinical Impression  Pt presented supine in bed with HOB elevated, awake and willing to participate in therapy session. Prior to admission, pt's family reported that pt ambulated with use of RW and required assistance with bathing. Pt has good family support and someone is available 24/7 to assist pt and his wife. Pt currently requires min A for bed mobility, min A for transfers and mod A to ambulate a very short distance. Pt also with cognitive deficits (see below). Pt is an excellent candidate for Inpatient Rehab for further intensive therapy services prior to returning home. Pt would continue to benefit from skilled physical therapy services at this time while admitted and after d/c to address the below listed limitations in order to improve overall safety and independence with functional mobility.     Follow Up Recommendations CIR    Equipment Recommendations  None recommended by PT    Recommendations for Other Services Rehab consult     Precautions / Restrictions Precautions Precautions: Fall Restrictions Weight Bearing Restrictions: No      Mobility  Bed Mobility Overal bed mobility: Needs Assistance Bed Mobility: Supine to Sit;Sit to Supine     Supine to sit: Min assist Sit to supine: Min guard   General bed mobility comments: increased time and effort, min A to elevate trunk to achieve sitting EOB, close min guard to return to supine  Transfers Overall transfer level: Needs assistance Equipment used: Rolling walker (2  wheeled);None Transfers: Sit to/from Stand Sit to Stand: Min assist         General transfer comment: increased time and effort, pt impulsive with transfer (standing prior to therapist donning gait belt and retrieving RW), min A for stability and safety with transition into standing from sitting EOB  Ambulation/Gait Ambulation/Gait assistance: Mod assist Ambulation Distance (Feet): 5 Feet Assistive device: Rolling walker (2 wheeled) Gait Pattern/deviations: Shuffle;Decreased step length - right;Decreased step length - left;Decreased stride length;Narrow base of support Gait velocity: Decreased Gait velocity interpretation: <1.8 ft/sec, indicative of risk for recurrent falls General Gait Details: significant instability with ambulation using RW and difficulty motor planning how to use a familiar AD (pt uses a RW to ambulate at baseline). Pt required mod A to maintain upright standing.  Stairs            Wheelchair Mobility    Modified Rankin (Stroke Patients Only) Modified Rankin (Stroke Patients Only) Pre-Morbid Rankin Score: Moderate disability Modified Rankin: Severe disability     Balance Overall balance assessment: Needs assistance Sitting-balance support: Feet supported Sitting balance-Leahy Scale: Fair     Standing balance support: During functional activity;Single extremity supported Standing balance-Leahy Scale: Poor Standing balance comment: pt reliant on external supports                             Pertinent Vitals/Pain Pain Assessment: No/denies pain    Home Living Family/patient expects to be discharged to:: Private residence Living Arrangements: Spouse/significant other;Children Available Help at Discharge: Family;Available 24 hours/day Type of Home: House Home Access: Stairs to enter  Entrance Stairs-Rails: Right Entrance Stairs-Number of Steps: 2 Home Layout: One level Home Equipment: Walker - 2 wheels;Cane - single point;Bedside  commode;Shower seat      Prior Function Level of Independence: Needs assistance   Gait / Transfers Assistance Needed: pt ambulates with RW  ADL's / Homemaking Assistance Needed: pt dresses himself and requires assistance with bathing        Hand Dominance        Extremity/Trunk Assessment   Upper Extremity Assessment Upper Extremity Assessment: Defer to OT evaluation    Lower Extremity Assessment Lower Extremity Assessment: Generalized weakness    Cervical / Trunk Assessment Cervical / Trunk Assessment: Kyphotic  Communication   Communication: HOH;Expressive difficulties  Cognition Arousal/Alertness: Awake/alert Behavior During Therapy: Impulsive Overall Cognitive Status: Difficult to assess Area of Impairment: Following commands;Safety/judgement;Problem solving                       Following Commands: Follows one step commands with increased time (with multi-modal cueing) Safety/Judgement: Decreased awareness of deficits;Decreased awareness of safety   Problem Solving: Slow processing;Difficulty sequencing;Requires verbal cues;Requires tactile cues        General Comments      Exercises     Assessment/Plan    PT Assessment Patient needs continued PT services  PT Problem List Decreased strength;Decreased activity tolerance;Decreased balance;Decreased mobility;Decreased coordination;Decreased cognition;Decreased knowledge of use of DME;Decreased safety awareness;Decreased knowledge of precautions       PT Treatment Interventions DME instruction;Gait training;Stair training;Functional mobility training;Therapeutic activities;Therapeutic exercise;Balance training;Neuromuscular re-education;Cognitive remediation;Patient/family education    PT Goals (Current goals can be found in the Care Plan section)  Acute Rehab PT Goals Patient Stated Goal: for pt to get stronger and more steady prior to returning home PT Goal Formulation: With family Time For  Goal Achievement: 03/02/17 Potential to Achieve Goals: Fair    Frequency Min 4X/week   Barriers to discharge        Co-evaluation               AM-PAC PT "6 Clicks" Daily Activity  Outcome Measure Difficulty turning over in bed (including adjusting bedclothes, sheets and blankets)?: Total Difficulty moving from lying on back to sitting on the side of the bed? : Total Difficulty sitting down on and standing up from a chair with arms (e.g., wheelchair, bedside commode, etc,.)?: Total Help needed moving to and from a bed to chair (including a wheelchair)?: A Lot Help needed walking in hospital room?: A Lot Help needed climbing 3-5 steps with a railing? : Total 6 Click Score: 8    End of Session Equipment Utilized During Treatment: Gait belt Activity Tolerance: Patient tolerated treatment well Patient left: in bed;with call bell/phone within reach;with bed alarm set;with family/visitor present Nurse Communication: Mobility status PT Visit Diagnosis: Unsteadiness on feet (R26.81);Other abnormalities of gait and mobility (R26.89);Other symptoms and signs involving the nervous system (R29.898)    Time: 1308-6578 PT Time Calculation (min) (ACUTE ONLY): 24 min   Charges:   PT Evaluation $PT Eval Moderate Complexity: 1 Procedure PT Treatments $Therapeutic Activity: 8-22 mins   PT G Codes:        [place], PT, DPT [place] 02/16/2017, 3:54 PM

## 2017-02-16 NOTE — Progress Notes (Signed)
OT Cancellation Note  Patient Details Name: Thomas [name] MRN: [id number] DOB: 1926/05/07   Cancelled Treatment:    Reason Eval/Treat Not Completed: Other (comment). Pt on bedrest. Please update activity orders when appropriate in order to initiate therapy. Thanks  [place], OT/L  [phone] 02/16/2017 02/16/2017, 10:00 AM

## 2017-02-16 NOTE — Progress Notes (Signed)
Patient arrived to unit with family at bedside. Admission completed with help of family.  Vitals stable, tele applied and verified.  Continue to monitor patient.

## 2017-02-16 NOTE — Progress Notes (Addendum)
Patient ID: Thomas [name], male   DOB: 24-Mar-1926, 83 y.o.   MRN: [id number]    PROGRESS NOTE  Leverett Camplin  YTK:[id number] DOB: 1926/05/28 DOA: 02/15/2017  PCP: Marin Olp, MD   Brief Narrative:  Pt is 82 yo male with known dementia, HTN, prior CVA's 5-6 episodes (on Eliquis), hx of prostate cancer treated with seed implants, recent UTI treated with ABX, presented with main concern of slurred speech, right facial droop that was noted several hours prior to admission. .   Assessment & Plan:   Stroke, posterior circulation, right cerebellum, multifocal areas in brain stem  Resultant  slurred speech, right facial droop  MRI head continued progression of severe vertebrobasilar system stenosis, loss of basial artery flow, lack of bilateral PCA flow, acute on subacute post circulation infarcts, multifocal new involvement of the brainstem  2D Echo  recent one done 12/2016 with stable EF 50-55%, sclerotic aortic valve   LDL 91  HgbA1c pending   After discussion with neurology team and family, plan for PCT consultation for further goal of care discussion  Permissive HTN due to underlying orthostatics   Not sure there is benefit of continuing AC   Hypertension, accelerated - Permissive HTN and gradually normalize   UTI, enterobacter - place on Levaquin based on sensitivity report   Hyperlipidemia - LDL 91  Hypokalemia - supplement, repeat BMP in AM  DVT prophylaxis: Eliquis  Code Status: DNR Family Communication: no family at bedside   Disposition Plan: to be determined   Consultants:   Neurology  PCT  PT/OT/SLP  Procedures:   None  Antimicrobials:   Levaquin 7/6 -->  Subjective: No events overnight.   Objective: Vitals:   02/16/17 0950 02/16/17 1154 02/16/17 1158 02/16/17 1159  BP: (!) 170/85 (!) 184/103 (!) 170/94 (!) 210/110  Pulse:      Resp: 16 16    Temp:      TempSrc:      SpO2: 99%  99% 97%  Weight:      Height:         Intake/Output Summary (Last 24 hours) at 02/16/17 1224 Last data filed at 02/16/17 0651  Gross per 24 hour  Intake              265 ml  Output              200 ml  Net               65 ml   Filed Weights   02/15/17 1900 02/15/17 2250  Weight: 72.7 kg (160 lb 4.4 oz) 74 kg (163 lb 3.2 oz)    Examination:  General exam: Appears calm, NAD Respiratory system: Respiratory effort normal. Diminished air movement at bases  Cardiovascular system:  RRR. No JVD, murmurs, rubs, gallops or clicks. No pedal edema. Gastrointestinal system: Abdomen is nondistended, soft and nontender. No organomegaly or masses felt. Normal bowel sounds heard. Central nervous system: Alert, right facial droop, aphasia.  Data Reviewed: I have personally reviewed following labs and imaging studies  CBC:  Recent Labs Lab 02/15/17 1911 02/15/17 1917  WBC 7.6  --   NEUTROABS 5.2  --   HGB 12.7* 13.3  HCT 39.8 39.0  MCV 88.8  --   PLT 267  --    Basic Metabolic Panel:  Recent Labs Lab 02/15/17 1911 02/15/17 1917  NA 136 142  K 3.0* 3.1*  CL 105 106  CO2 16*  --   GLUCOSE 153* 153*  BUN 17 20  CREATININE 1.31* 1.10  CALCIUM 9.1  --    GFR: Estimated Creatinine Clearance: 47.7 mL/min (by C-G formula based on SCr of 1.1 mg/dL). Liver Function Tests:  Recent Labs Lab 02/15/17 1911  AST 22  ALT 14*  ALKPHOS 80  BILITOT 0.8  PROT 7.8  ALBUMIN 3.4*   Coagulation Profile:  Recent Labs Lab 02/15/17 1911  INR 1.35   CBG:  Recent Labs Lab 02/15/17 1911  GLUCAP 150*   Lipid Profile:  Recent Labs  02/16/17 0515  CHOL 134  HDL 26*  LDLCALC 91  TRIG 85  CHOLHDL 5.2   Urine analysis:    Component Value Date/Time   COLORURINE YELLOW 02/15/2017 2152   APPEARANCEUR HAZY (A) 02/15/2017 2152   LABSPEC 1.021 02/15/2017 2152   PHURINE 5.0 02/15/2017 2152   GLUCOSEU NEGATIVE 02/15/2017 2152   HGBUR SMALL (A) 02/15/2017 2152   HGBUR negative 04/20/2010 0000   BILIRUBINUR  NEGATIVE 02/15/2017 2152   BILIRUBINUR n 02/12/2017 1609   KETONESUR 5 (A) 02/15/2017 2152   PROTEINUR 30 (A) 02/15/2017 2152   UROBILINOGEN 1.0 02/12/2017 1609   UROBILINOGEN 1.0 07/21/2013 1825   NITRITE POSITIVE (A) 02/15/2017 2152   LEUKOCYTESUR LARGE (A) 02/15/2017 2152   Recent Results (from the past 240 hour(s))  Urine Culture     Status: None   Collection Time: 02/12/17  4:04 PM  Result Value Ref Range Status   Culture ENTEROBACTER CLOACAE  Final   Colony Count Greater than 100,000 CFU/mL  Final   Organism ID, Bacteria ENTEROBACTER CLOACAE  Final      Susceptibility   Enterobacter cloacae -  (no method available)    AMOX/CLAVULANIC >=32 Resistant     PIP/TAZO 8 Sensitive     IMIPENEM 0.5 Sensitive     CEFAZOLIN >=64 Resistant     CEFTRIAXONE <=1 Sensitive     CEFTAZIDIME <=1 Sensitive     CEFEPIME <=1 Sensitive     GENTAMICIN <=1 Sensitive     TOBRAMYCIN <=1 Sensitive     CIPROFLOXACIN <=0.25 Sensitive     LEVOFLOXACIN <=0.12 Sensitive     NITROFURANTOIN 64 Intermediate     TRIMETH/SULFA* <=20 Sensitive      * NR=NOT REPORTABLE,SEE COMMENTORAL therapy:A cefazolin MIC of <32 predicts susceptibility to the oral agents cefaclor,cefdinir,cefpodoxime,cefprozil,cefuroxime,cephalexin,and loracarbef when used for therapy of uncomplicated UTIs due to E.coli,K.pneumomiae,and P.mirabilis. PARENTERAL therapy: A cefazolinMIC of >8 indicates resistance to parenteralcefazolin. An alternate test method must beperformed to confirm susceptibility to parenteralcefazolin.      Radiology Studies: Dg Chest 2 View  Result Date: 02/16/2017 CLINICAL DATA:  CVA EXAM: CHEST  2 VIEW COMPARISON:  01/14/2017 FINDINGS: Complete opacification of the left hemithorax again noted, stable. Right lung is clear. Heart is mildly enlarged. No change since prior study. IMPRESSION: Stable complete opacification of the left hemithorax, stable. Electronically Signed   By: Rolm Baptise M.D.   On: 02/16/2017 11:03    Mr Brain Wo Contrast  Addendum Date: 02/16/2017   ADDENDUM REPORT: 02/16/2017 12:05 ADDENDUM: Study discussed by telephone with Dr. Doyle Askew On 02/16/2017 at 12:03 . Electronically Signed   By: Genevie Ann M.D.   On: 02/16/2017 12:05   Result Date: 02/16/2017 CLINICAL DATA:  83 year old male with new onset slurred speech and facial droop since yesterday. Right side posterior circulation infarcts in May and June. EXAM: MRI HEAD WITHOUT CONTRAST MRA HEAD WITHOUT CONTRAST TECHNIQUE: Multiplanar, multiecho pulse sequences of the brain and surrounding structures were obtained without  intravenous contrast. Angiographic images of the head were obtained using MRA technique without contrast. COMPARISON:  Brain MRI 01/22/2017 and earlier. CTA head and neck 01/05/2017. Intracranial MRA 12/14/2016. FINDINGS: MRI HEAD FINDINGS Brain: New restricted diffusion in the right lateral pons and left paracentral pons. New small foci of cortical restricted diffusion in the inferior left occipital lobe, and at the right temporal lobe tip. New patchy areas of restricted diffusion in the right cerebellar hemisphere, including the right cerebellar peduncle, corresponding to areas which were relatively spared by the large right inferior cerebellar infarct on 01/06/2017. There is also punctate restricted diffusion in the left thalamus. Continued restricted diffusion in the dorsal right medulla, and right ventral midbrain. No anterior circulation restricted diffusion identified. Associated T2 and FLAIR hyperintensity in the acutely affected areas with no associated hemorrhage or mass effect. Developing encephalomalacia in the right cerebellar hemisphere and both occipital poles. Patchy and confluent bilateral cerebral white matter T2 and FLAIR hyperintensity elsewhere is stable. A small area of cortical encephalomalacia in the superior left parietal lobe is stable. No midline shift, mass effect, evidence of mass lesion, ventriculomegaly,  extra-axial collection or acute intracranial hemorrhage. Cervicomedullary junction and pituitary are within normal limits. Vascular: Worsening basilar artery flow void since 01/22/2017. Continued abnormal distal left V4 segment flow void. Other Major intracranial vascular flow voids are stable. Skull and upper cervical spine: Negative. Visualized bone marrow signal is within normal limits. Sinuses/Orbits: Stable an negative. Other: Stable mild mastoid effusions.  Negative scalp soft tissues. MRA HEAD FINDINGS Little antegrade flow in the distal vertebral arteries and basilar artery, appearing similar to but worse than the CTA head and neck on 01/05/2017, and severely progressed loss of basilar artery flow since the MRA on 12/14/2016. Preserved left PICA flow signal. No significant flow in the bilateral PCAs. Antegrade flow in both ICA siphons appears stable since 12/14/2016, although there is mild to moderate left and moderate to severe right ICA siphon stenosis. Both carotid termini remain patent. MCA and ACA origins remain patent. Visible bilateral MCA and ACA branches are stable since 12/14/2016. IMPRESSION: 1. Continued progression of severe vertebrobasilar system stenosis and loss of basilar artery flow since the Head and neck CTA on 12/31/2016. Lack of bilateral PCA flow signal today. 2. Acute on subacute posterior circulation infarcts with fairly extensive new involvement of the right cerebellum, and multifocal new involvement of the brainstem. 3. No associated hemorrhage or mass effect. 4. The anterior circulation is stable the 12/14/2016 MRA, with up to moderate left and severe right ICA siphon stenosis. Electronically Signed: By: Genevie Ann M.D. On: 02/16/2017 11:43   Mr Jodene Nam Head/brain VH Cm  Addendum Date: 02/16/2017   ADDENDUM REPORT: 02/16/2017 12:05 ADDENDUM: Study discussed by telephone with Dr. Doyle Askew On 02/16/2017 at 12:03 . Electronically Signed   By: Genevie Ann M.D.   On: 02/16/2017 12:05   Result  Date: 02/16/2017 CLINICAL DATA:  83 year old male with new onset slurred speech and facial droop since yesterday. Right side posterior circulation infarcts in May and June. EXAM: MRI HEAD WITHOUT CONTRAST MRA HEAD WITHOUT CONTRAST TECHNIQUE: Multiplanar, multiecho pulse sequences of the brain and surrounding structures were obtained without intravenous contrast. Angiographic images of the head were obtained using MRA technique without contrast. COMPARISON:  Brain MRI 01/22/2017 and earlier. CTA head and neck 01/05/2017. Intracranial MRA 12/14/2016. FINDINGS: MRI HEAD FINDINGS Brain: New restricted diffusion in the right lateral pons and left paracentral pons. New small foci of cortical restricted diffusion in the inferior  left occipital lobe, and at the right temporal lobe tip. New patchy areas of restricted diffusion in the right cerebellar hemisphere, including the right cerebellar peduncle, corresponding to areas which were relatively spared by the large right inferior cerebellar infarct on 01/06/2017. There is also punctate restricted diffusion in the left thalamus. Continued restricted diffusion in the dorsal right medulla, and right ventral midbrain. No anterior circulation restricted diffusion identified. Associated T2 and FLAIR hyperintensity in the acutely affected areas with no associated hemorrhage or mass effect. Developing encephalomalacia in the right cerebellar hemisphere and both occipital poles. Patchy and confluent bilateral cerebral white matter T2 and FLAIR hyperintensity elsewhere is stable. A small area of cortical encephalomalacia in the superior left parietal lobe is stable. No midline shift, mass effect, evidence of mass lesion, ventriculomegaly, extra-axial collection or acute intracranial hemorrhage. Cervicomedullary junction and pituitary are within normal limits. Vascular: Worsening basilar artery flow void since 01/22/2017. Continued abnormal distal left V4 segment flow void. Other Major  intracranial vascular flow voids are stable. Skull and upper cervical spine: Negative. Visualized bone marrow signal is within normal limits. Sinuses/Orbits: Stable an negative. Other: Stable mild mastoid effusions.  Negative scalp soft tissues. MRA HEAD FINDINGS Little antegrade flow in the distal vertebral arteries and basilar artery, appearing similar to but worse than the CTA head and neck on 01/05/2017, and severely progressed loss of basilar artery flow since the MRA on 12/14/2016. Preserved left PICA flow signal. No significant flow in the bilateral PCAs. Antegrade flow in both ICA siphons appears stable since 12/14/2016, although there is mild to moderate left and moderate to severe right ICA siphon stenosis. Both carotid termini remain patent. MCA and ACA origins remain patent. Visible bilateral MCA and ACA branches are stable since 12/14/2016. IMPRESSION: 1. Continued progression of severe vertebrobasilar system stenosis and loss of basilar artery flow since the Head and neck CTA on 12/31/2016. Lack of bilateral PCA flow signal today. 2. Acute on subacute posterior circulation infarcts with fairly extensive new involvement of the right cerebellum, and multifocal new involvement of the brainstem. 3. No associated hemorrhage or mass effect. 4. The anterior circulation is stable the 12/14/2016 MRA, with up to moderate left and severe right ICA siphon stenosis. Electronically Signed: By: Genevie Ann M.D. On: 02/16/2017 11:43   Ct Head Code Stroke W/o Cm  Result Date: 02/15/2017 CLINICAL DATA:  Code stroke. Slurred speech, RIGHT facial droop. Last seen normal at 1800 hours. History of hypertension, prostate cancer, diabetes, stroke. EXAM: CT HEAD WITHOUT CONTRAST TECHNIQUE: Contiguous axial images were obtained from the base of the skull through the vertex without intravenous contrast. COMPARISON:  MRI of the head January 22, 2017 and CT HEAD January 18, 2017. FINDINGS: BRAIN: No intraparenchymal hemorrhage, mass  effect nor midline shift. Mesial LEFT parietoccipital and RIGHT occipital lobe encephalomalacia. Old RIGHT cerebellar infarcts. Old RIGHT basal ganglia lacunar infarct versus lacunar infarct. Old small LEFT parietal vertex infarct. Confluent supratentorial white matter hypodensities. No midline shift, mass effect or acute large vascular territory infarct. No abnormal extra-axial fluid collections. Basal cisterns are patent. VASCULAR: Moderate to severe calcific atherosclerosis of the carotid siphons. SKULL: No skull fracture. No significant scalp soft tissue swelling. SINUSES/ORBITS: The mastoid air-cells and included paranasal sinuses are well-aerated.The included ocular globes and orbital contents are non-suspicious. OTHER: None. ASPECTS [facility] Stroke Program Early CT Score) - Ganglionic level infarction (caudate, lentiform nuclei, internal capsule, insula, M1-M3 cortex): 7 - Supraganglionic infarction (M4-M6 cortex): 3 Total score (0-10 with 10 being normal):  10 IMPRESSION: 1. No acute intracranial process. 2. Old bilateral PCA territory and RIGHT posterior-inferior cerebellar artery territory infarcts. Old small LEFT parietal lobe infarct. Old basal ganglia lacunar infarcts. 3. Severe chronic small vessel ischemic disease. 4. ASPECTS is 10. Critical Value/emergent results were called by telephone at the time of interpretation on 02/15/2017 at 7:30 pm to Dr. Paschal Dopp, Neurology, who verbally acknowledged these results. Electronically Signed   By: Elon Alas M.D.   On: 02/15/2017 19:34      Scheduled Meds: . amLODipine  5 mg Oral Daily  . apixaban  5 mg Oral BID  . aspirin EC  81 mg Oral Daily  . cloNIDine  0.1 mg Oral BID  . QUEtiapine  25 mg Oral QHS  . rosuvastatin  20 mg Oral Daily   Continuous Infusions: . sodium chloride       LOS: 1 day   Time spent: 35 minutes   Faye Ramsay, MD [facility] Pager [phone]  If 7PM-7AM, please contact  night-coverage [email or web address] Password [email or web address] 02/16/2017, 12:24 PM

## 2017-02-16 NOTE — Telephone Encounter (Signed)
I think it would make sense to just wait on the seroquel- they mentioned the medicine was working- did they fill it short term? Can we get any advise on medications that are covered? Is risperdal covered?

## 2017-02-17 ENCOUNTER — Inpatient Hospital Stay (HOSPITAL_COMMUNITY): Payer: Medicare Other

## 2017-02-17 DIAGNOSIS — Z515 Encounter for palliative care: Secondary | ICD-10-CM

## 2017-02-17 LAB — CBC
HEMATOCRIT: 37.1 % — AB (ref 39.0–52.0)
HEMOGLOBIN: 12.2 g/dL — AB (ref 13.0–17.0)
MCH: 29 pg (ref 26.0–34.0)
MCHC: 32.9 g/dL (ref 30.0–36.0)
MCV: 88.3 fL (ref 78.0–100.0)
Platelets: 268 10*3/uL (ref 150–400)
RBC: 4.2 MIL/uL — AB (ref 4.22–5.81)
RDW: 16.8 % — AB (ref 11.5–15.5)
WBC: 9.2 10*3/uL (ref 4.0–10.5)

## 2017-02-17 LAB — BASIC METABOLIC PANEL
ANION GAP: 12 (ref 5–15)
BUN: 14 mg/dL (ref 6–20)
CALCIUM: 8.6 mg/dL — AB (ref 8.9–10.3)
CO2: 15 mmol/L — AB (ref 22–32)
Chloride: 109 mmol/L (ref 101–111)
Creatinine, Ser: 1.04 mg/dL (ref 0.61–1.24)
GFR calc Af Amer: >60 mL/min (ref >60)
GFR calc non Af Amer: >60 mL/min (ref >60)
Glucose, Bld: 136 mg/dL — ABNORMAL HIGH (ref 65–99)
POTASSIUM: 3 mmol/L — AB (ref 3.5–5.1)
Sodium: 136 mmol/L (ref 135–145)

## 2017-02-17 LAB — HEMOGLOBIN A1C
HEMOGLOBIN A1C: 6.7 % — AB (ref 4.8–5.6)
Mean Plasma Glucose: 146 mg/dL

## 2017-02-17 MED ORDER — RESOURCE THICKENUP CLEAR PO POWD
ORAL | Status: DC | PRN
  Filled 2017-02-17: qty 125

## 2017-02-17 NOTE — Procedures (Signed)
Objective Swallowing Evaluation: Type of Study: MBS-Modified Barium Swallow Study  Patient Details  Name: Thomas [name] MRN: [id number] Date of Birth: 05-Oct-1925  Today's Date: 02/17/2017 Time: SLP Start Time (ACUTE ONLY): 1045-SLP Stop Time (ACUTE ONLY): 1125 SLP Time Calculation (min) (ACUTE ONLY): 40 min  Past Medical History:  Past Medical History:  Diagnosis Date  . Adenomatous colon polyp 2009  . Allergy   . Carotid artery occlusion   . Diverticulosis of colon (without mention of hemorrhage)   . ED (erectile dysfunction)   . Esophageal stricture   . Esophagitis   . GERD (gastroesophageal reflux disease)   . Goals of care, counseling/discussion 12/23/2016  . Hiatal hernia   . Hypertension   . Prostate cancer ([place])   . Unspecified gastritis and gastroduodenitis without mention of hemorrhage    Past Surgical History:  Past Surgical History:  Procedure Laterality Date  . ENDARTERECTOMY Left 03/05/2013   Procedure: ENDARTERECTOMY CAROTID;  Surgeon: Conrad Saybrook, MD;  Location: [facility];  Service: Vascular;  Laterality: Left;  . ESOPHAGOGASTRODUODENOSCOPY (EGD) WITH ESOPHAGEAL DILATION    . PROSTATE SURGERY     Laser surgery   HPI: Pt is an 83 y.o. male with PMH: dementia, HTN, multiple prior CVA's, mesothelioma, who presented to ED with right sided facial droop and slurred speech at his [facility] on [facility]. Upon hospital admission, daughter reported that patient drinks nectar thick liquids and regular solids at home. He has had two recent MBS during previous hospitalizations, on 5/25 and 6/8, both of which recommended nectar thick liquids and mechanical soft solids due to aspiration of thin liquids and oral and swallow initiation delays.   Subjective: patient alert but appeared fatigued   Assessment / Plan / Recommendation  CHL IP CLINICAL IMPRESSIONS 02/17/2017  Clinical Impression Patient presents with a severe oral and mod-severe sensorimotor pharyngeal dysphagia with some impact from  patient's current level of alertness(appears fatigued/tired). Patient's oral dysphagia is characterized by lingual manipulation and transit delays, and decreased ability to masticate soft solid textures. Pharyngeal phase is characterized by swallow initiation delay to vallecular sinus with puree solids, nectar thick liquids, honey thick liquids and mechanical soft solids, and delays to pyriform sinus with thin liquids. Patient did require cues for first and at times second swallow, however he was able to fully clear pharyngeal residuals. Despite significant delays of swallow initiation, he demonstrated good airway protection with honey thick, puree, nectar thick and soft solid textures. He exhibited one instance of flash penetration with nectar thick liquids via cup sips and exhibited consistent flash penetration with thin liquids via spoon and via cup sips. As compared to recent two MBS's, patient's oral dysphagia is worse on today's exam, but he did not exhibit the level of penetration and aspiration with thins as on the previous tests.  SLP Visit Diagnosis Dysphagia, oropharyngeal phase (R13.12)  Attention and concentration deficit following --  Frontal lobe and executive function deficit following --  Impact on safety and function Moderate aspiration risk      CHL IP TREATMENT RECOMMENDATION 02/17/2017  Treatment Recommendations Therapy as outlined in treatment plan below     Prognosis 02/17/2017  Prognosis for Safe Diet Advancement Good  Barriers to Reach Goals Cognitive deficits  Barriers/Prognosis Comment --    CHL IP DIET RECOMMENDATION 02/17/2017  SLP Diet Recommendations Dysphagia 1 (Puree) solids;Nectar thick liquid  Liquid Administration via Spoon  Medication Administration Crushed with puree  Compensations Minimize environmental distractions;Slow rate;Small sips/bites  Postural Changes Seated upright at  90 degrees;Remain semi-upright after after feeds/meals (Comment)      CHL IP OTHER  RECOMMENDATIONS 02/17/2017  Recommended Consults --  Oral Care Recommendations Oral care BID  Other Recommendations Order thickener from pharmacy;Prohibited food (jello, ice cream, thin soups);Remove water pitcher      CHL IP FOLLOW UP RECOMMENDATIONS 02/17/2017  Follow up Recommendations Skilled Nursing facility      [facility] IP FREQUENCY AND DURATION 02/17/2017  Speech Therapy Frequency (ACUTE ONLY) min 2x/week  Treatment Duration 2 weeks           CHL IP ORAL PHASE 02/17/2017  Oral Phase Impaired  Oral - Pudding Teaspoon --  Oral - Pudding Cup --  Oral - Honey Teaspoon Weak lingual manipulation;Lingual/palatal residue;Delayed oral transit  Oral - Honey Cup Weak lingual manipulation;Delayed oral transit;Lingual/palatal residue  Oral - Nectar Teaspoon Weak lingual manipulation;Delayed oral transit  Oral - Nectar Cup Delayed oral transit;Weak lingual manipulation  Oral - Nectar Straw NT  Oral - Thin Teaspoon Right anterior bolus loss;Weak lingual manipulation;Delayed oral transit  Oral - Thin Cup Delayed oral transit;Weak lingual manipulation  Oral - Thin Straw NT  Oral - Puree Weak lingual manipulation;Right anterior bolus loss;Delayed oral transit;Lingual/palatal residue  Oral - Mech Soft Weak lingual manipulation;Impaired mastication;Delayed oral transit;Lingual/palatal residue  Oral - Regular NT  Oral - Multi-Consistency NT  Oral - Pill NT  Oral Phase - Comment --    CHL IP PHARYNGEAL PHASE 02/17/2017  Pharyngeal Phase Impaired  Pharyngeal- Pudding Teaspoon --  Pharyngeal --  Pharyngeal- Pudding Cup --  Pharyngeal --  Pharyngeal- Honey Teaspoon Delayed swallow initiation-vallecula  Pharyngeal Material does not enter airway  Pharyngeal- Honey Cup Delayed swallow initiation-vallecula;Pharyngeal residue - valleculae  Pharyngeal Material does not enter airway  Pharyngeal- Nectar Teaspoon Delayed swallow initiation-vallecula  Pharyngeal Material does not enter airway  Pharyngeal-  Nectar Cup Delayed swallow initiation-vallecula;Penetration/Aspiration during swallow;Pharyngeal residue - valleculae  Pharyngeal Material enters airway, remains ABOVE vocal cords then ejected out  Pharyngeal- Nectar Straw NT  Pharyngeal --  Pharyngeal- Thin Teaspoon Penetration/Aspiration during swallow;Delayed swallow initiation-pyriform sinuses;Reduced tongue base retraction;Reduced airway/laryngeal closure  Pharyngeal Material enters airway, remains ABOVE vocal cords then ejected out  Pharyngeal- Thin Cup Delayed swallow initiation-pyriform sinuses;Reduced airway/laryngeal closure;Reduced tongue base retraction;Penetration/Aspiration during swallow  Pharyngeal Material enters airway, remains ABOVE vocal cords then ejected out  Pharyngeal- Thin Straw NT  Pharyngeal --  Pharyngeal- Puree Delayed swallow initiation-vallecula;Pharyngeal residue - valleculae  Pharyngeal --  Pharyngeal- Mechanical Soft Delayed swallow initiation-vallecula;Pharyngeal residue - valleculae  Pharyngeal --  Pharyngeal- Regular NT  Pharyngeal --  Pharyngeal- Multi-consistency NT  Pharyngeal --  Pharyngeal- Pill --  Pharyngeal --  Pharyngeal Comment --     CHL IP CERVICAL ESOPHAGEAL PHASE 02/17/2017  Cervical Esophageal Phase WFL  Pudding Teaspoon --  Pudding Cup --  Honey Teaspoon --  Honey Cup --  Nectar Teaspoon --  Nectar Cup --  Nectar Straw --  Thin Teaspoon --  Thin Cup --  Thin Straw --  Puree --  Mechanical Soft --  Regular --  Multi-consistency --  Pill --  Cervical Esophageal Comment --    CHL IP GO 12/14/2016  Functional Assessment Tool Used clinical judgment  Functional Limitations Memory  Swallow Current Status (B9390) (None)  Swallow Goal Status (Z0092) (None)  Swallow Discharge Status (Z3007) (None)  Motor Speech Current Status (M2263) (None)  Motor Speech Goal Status (F3545) (None)  Motor Speech Goal Status (G2563) (None)  Spoken Language Comprehension Current Status (S9373)  (None)  Spoken Language Comprehension Goal Status [phone]) (None)  Spoken Language Comprehension Discharge Status [phone]) (None)  Spoken Language Expression Current Status [phone]) (None)  Spoken Language Expression Goal Status [phone]) (None)  Spoken Language Expression Discharge Status [phone]) (None)  Attention Current Status (F1252) (None)  Attention Goal Status (V1292) (None)  Attention Discharge Status [phone]) (None)  Memory Current Status (O1499) CK  Memory Goal Status (U9249) CK  Memory Discharge Status (G9170) CK  Voice Current Status (G9171) (None)  Voice Goal Status (J2419) (None)  Voice Discharge Status (R1444) (None)  Other Speech-Language Pathology Functional Limitation Current Status (P8483) (None)  Other Speech-Language Pathology Functional Limitation Goal Status (T0757) (None)  Other Speech-Language Pathology Functional Limitation Discharge Status [phone]) (None)   Sonia Baller, MA, CCC-SLP 02/17/17 4:48 PM

## 2017-02-17 NOTE — Progress Notes (Signed)
Modified Barium Swallow Progress Note  Patient Details  Name: Borden Thune MRN: [id number] Date of Birth: 1926-05-23  Today's Date: 02/17/2017  Modified Barium Swallow completed.  Full report located under Chart Review in the Imaging Section.  Brief recommendations include the following:  Clinical Impression  Patient presents with a severe oral and mod-severe sensorimotor pharyngeal dysphagia with some impact from patient's current level of alertness(appears fatigued/tired). Patient's oral dysphagia is characterized by lingual manipulation and transit delays, and decreased ability to masticate soft solid textures. Pharyngeal phase is characterized by swallow initiation delay to vallecular sinus with puree solids, nectar thick liquids, honey thick liquids and mechanical soft solids, and delays to pyriform sinus with thin liquids. Patient did require cues for first and at times second swallow, however he was able to fully clear pharyngeal residuals. Despite significant delays of swallow initiation, he demonstrated good airway protection with honey thick, puree, nectar thick and soft solid textures. He exhibited one instance of flash penetration with nectar thick liquids via cup sips and exhibited consistent flash penetration with thin liquids via spoon and via cup sips. As compared to recent two MBS's, patient's oral dysphagia is worse on today's exam, but he did not exhibit the level of penetration and aspiration with thins as on the previous tests.   Swallow Evaluation Recommendations       SLP Diet Recommendations: Dysphagia 1 (Puree) solids;Nectar thick liquid   Liquid Administration via: Spoon   Medication Administration: Crushed with puree   Supervision: Staff to assist with self feeding;Full supervision/cueing for compensatory strategies   Compensations: Minimize environmental distractions;Slow rate;Small sips/bites   Postural Changes: Seated upright at 90 degrees;Remain semi-upright  after after feeds/meals (Comment)   Oral Care Recommendations: Oral care BID   Other Recommendations: Order thickener from pharmacy;Prohibited food (jello, ice cream, thin soups);Remove water pitcher    Sonia Baller, [place], CCC-SLP 02/17/17 4:50 PM

## 2017-02-17 NOTE — Evaluation (Signed)
Occupational Therapy Evaluation Patient Details Name: Thomas [name] MRN: [id number] DOB: [birth year] Today's Date: 02/17/2017    History of Present Illness Pt is an 83 y/o male admitted secondary to slurred speech and facial droop. 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 PMH including but not limited to dementia, HTN and previous CVAs.   Clinical Impression   Pt with decline in function and safety with ADLs and ADL mobility with decreased strength, balance, endurance, coordination, cognition and safety awareness. PTA, pt received assist from family for bathing and min A with mobility. Pt would benefit from acute OT services to address impairments to increase level of function and safety    Follow Up Recommendations  CIR;Supervision/Assistance - 24 hour    Equipment Recommendations  None recommended by OT    Recommendations for Other Services Rehab consult     Precautions / Restrictions Precautions Precautions: Fall Restrictions Weight Bearing Restrictions: No      Mobility Bed Mobility               General bed mobility comments: pt up in recliner upon arrival  Transfers Overall transfer level: Needs assistance Equipment used: Rolling walker (2 wheeled);None Transfers: Sit to/from Stand Sit to Stand: Min assist         General transfer comment: increased time and effort, min A for balance/stability and safety with transition into standing from sitting in recliner to stand at sink and to [facility]    Balance Overall balance assessment: Needs assistance Sitting-balance support: Feet supported       Standing balance support: During functional activity;Single extremity supported Standing balance-Leahy Scale: Poor Standing balance comment: slight R lean                           ADL either performed or assessed with clinical judgement    ADL Overall ADL's : Needs assistance/impaired Eating/Feeding: Set up;Sitting   Grooming: Standing;Minimal assistance;Wash/dry hands;Wash/dry face;Oral care Grooming Details (indicate cue type and reason): total A to apply toothpaste to toothbrush Upper Body Bathing: Sitting;Min guard (simulated)   Lower Body Bathing: Maximal assistance;Sit to/from stand   Upper Body Dressing : Min guard;Sitting   Lower Body Dressing: Total assistance   Toilet Transfer: RW;Minimal assistance;BSC;Stand-pivot;Cueing for safety;Cueing for sequencing   Toileting- Clothing Manipulation and Hygiene: Total assistance       Functional mobility during ADLs: Rolling walker;Cueing for safety;Cueing for sequencing;Minimal assistance General ADL Comments:  Leaning  to R while on BSC and when standing at sink     Vision Baseline Vision/History: Wears glasses Wears Glasses: Reading only Patient Visual Report: No change from baseline                  Pertinent Vitals/Pain Pain Assessment: No/denies pain     Hand Dominance Right   Extremity/Trunk Assessment Upper Extremity Assessment Upper Extremity Assessment: Generalized weakness   Lower Extremity Assessment Lower Extremity Assessment: Defer to PT evaluation   Cervical / Trunk Assessment Cervical / Trunk Assessment: Kyphotic   Communication Communication Communication: HOH;Expressive difficulties   Cognition Arousal/Alertness: Awake/alert Behavior During Therapy: WFL for tasks assessed/performed   Area of Impairment: Following commands;Safety/judgement;Problem solving                 Orientation Level: Place;Time   Memory: Decreased short-term memory Following Commands: Follows one step commands with increased time Safety/Judgement: Decreased awareness of  deficits;Decreased awareness of safety   Problem Solving: Slow processing;Difficulty sequencing;Requires verbal cues;Requires tactile cues     General Comments   pt very  pleasant and cooperative, daughter very supportive               Home Living Family/patient expects to be discharged to:: Private residence Living Arrangements: Spouse/significant other;Children Available Help at Discharge: Family;Available 24 hours/day Type of Home: House Home Access: Stairs to enter [employer] of Steps: 2 Entrance Stairs-Rails: Right Home Layout: One level     Bathroom Shower/Tub: Tub/shower unit;Door   [employer] Toilet: Standard Bathroom Accessibility: Yes How Accessible: Accessible via walker Home Equipment: Walker - 2 wheels;Cane - single point;Bedside commode;Shower seat      Lives With: Spouse;Son    Prior Functioning/Environment Level of Independence: Needs assistance  Gait / Transfers Assistance Needed: pt ambulates with RW ADL's / Homemaking Assistance Needed: pt dresses himself and requires assistance with bathing   Comments: use of cane ~ 1 year        OT Problem List: Decreased activity tolerance;Decreased strength;Impaired balance (sitting and/or standing);Decreased cognition;Decreased coordination;Decreased safety awareness;Decreased knowledge of use of DME or AE;Decreased knowledge of precautions;Impaired UE functional use      OT Treatment/Interventions: Self-care/ADL training;Therapeutic exercise;Neuromuscular education;DME and/or AE instruction;Cognitive remediation/compensation;Patient/family education;Therapeutic activities    OT Goals(Current goals can be found in the care plan section) Acute Rehab OT Goals Patient Stated Goal: for pt to get stronger and more steady prior to returning home OT Goal Formulation: With patient/family Time For Goal Achievement: 02/24/17 Potential to Achieve Goals: Good ADL Goals Pt Will Perform Grooming: with min guard assist;standing Pt Will Perform Upper Body Bathing: with supervision;with set-up;sitting Pt Will Perform Lower Body Bathing: with mod assist;sitting/lateral leans;sit  to/from stand Pt Will Perform Upper Body Dressing: with supervision;with set-up;sitting Pt Will Transfer to Toilet: with min guard assist;bedside commode;ambulating;regular height toilet;grab bars Pt Will Perform Toileting - Clothing Manipulation and hygiene: with max assist;with mod assist;sitting/lateral leans;sit to/from stand  OT Frequency: Min 3X/week   Barriers to D/C: Decreased caregiver support                        AM-PAC PT "6 Clicks" Daily Activity     Outcome Measure Help from another person eating meals?: None Help from another person taking care of personal grooming?: A Little Help from another person toileting, which includes using toliet, bedpan, or urinal?: A Lot Help from another person bathing (including washing, rinsing, drying)?: A Lot Help from another person to put on and taking off regular upper body clothing?: A Little Help from another person to put on and taking off regular lower body clothing?: Total 6 Click Score: 15   End of Session Equipment Utilized During Treatment: Gait belt;Rolling walker;Other (comment) (BSC)  Activity Tolerance: Patient tolerated treatment well Patient left: in chair;with call bell/phone within reach;with chair alarm set;with family/visitor present  OT Visit Diagnosis: Muscle weakness (generalized) (M62.81);Other abnormalities of gait and mobility (R26.89);Unsteadiness on feet (R26.81);Other symptoms and signs involving cognitive function                Time: 8299-3716 OT Time Calculation (min): 34 min Charges:  OT General Charges $OT Visit: 1 Procedure OT Evaluation $OT Eval Moderate Complexity: 1 Procedure OT Treatments $Self Care/Home Management : 8-22 mins $Therapeutic Activity: 8-22 mins G-Codes: OT G-codes **NOT FOR INPATIENT CLASS** Functional Assessment Tool Used: AM-PAC 6 Clicks Daily Activity     Britt Bottom 02/17/2017,  11:03 AM

## 2017-02-17 NOTE — Progress Notes (Signed)
Physical Therapy Treatment Patient Details Name: Thomas [name] MRN: [id number] DOB: [birth year] Today's Date: 02/17/2017    History of Present Illness Pt is an 83 y/o male admitted secondary to slurred speech and facial droop. 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 PMH including but not limited to dementia, HTN and previous CVAs.    PT Comments    Pt making slow progress with mobility and continues to require physical assistance of two for transfers with use of RW. Pt demonstrates deficits in strength, balance, motor coordination, motor planning, cognition and safety awareness which is currently limiting his functional mobility. Pt remains an excellent candidate for Inpatient Rehab for further intensive therapy services prior to returning home with wonderful family support. PT will continue to follow acutely.    Follow Up Recommendations  CIR     Equipment Recommendations  None recommended by PT    Recommendations for Other Services Rehab consult     Precautions / Restrictions Precautions Precautions: Fall Restrictions Weight Bearing Restrictions: No    Mobility  Bed Mobility Overal bed mobility: Needs Assistance Bed Mobility: Sidelying to Sit   Sidelying to sit: Min guard       General bed mobility comments: increased time and effort, min guard for safety  Transfers Overall transfer level: Needs assistance Equipment used: Rolling walker (2 wheeled) Transfers: Sit to/from [facility] Sit to Stand: Mod assist;+2 physical assistance Stand pivot transfers: Mod assist;+2 physical assistance       General transfer comment: increased time and effort, mod A x2 to rise from bed x1 and from [facility] x1, mod A x2 for pivotal movements  Ambulation/Gait                 Stairs            Wheelchair Mobility    Modified Rankin  (Stroke Patients Only) Modified Rankin (Stroke Patients Only) Pre-Morbid Rankin Score: Moderate disability Modified Rankin: Severe disability     Balance Overall balance assessment: Needs assistance Sitting-balance support: Feet supported Sitting balance-Leahy Scale: Fair     Standing balance support: During functional activity;Single extremity supported Standing balance-Leahy Scale: Poor Standing balance comment: pt reliant on at least one UE support and external assist                            Cognition Arousal/Alertness: Awake/alert Behavior During Therapy: Flat affect Overall Cognitive Status: History of cognitive impairments - at baseline Area of Impairment: Following commands;Safety/judgement;Problem solving                 Orientation Level: Place;Time   Memory: Decreased short-term memory Following Commands: Follows one step commands with increased time Safety/Judgement: Decreased awareness of deficits;Decreased awareness of safety   Problem Solving: Slow processing;Difficulty sequencing;Requires verbal cues;Requires tactile cues General Comments: pt with hx of dementia at baseline      Exercises      General Comments        Pertinent Vitals/Pain Pain Assessment: Faces Faces Pain Scale: Hurts little more Pain Location: back Pain Descriptors / Indicators: Sore Pain Intervention(s): Monitored during session;Repositioned;Patient requesting pain meds-RN notified    Home Living Family/patient expects to be discharged to:: Private residence Living Arrangements: Spouse/significant other;Children Available Help at Discharge: Family;Available 24 hours/day Type of Home: House Home Access: Stairs to enter Entrance Stairs-Rails: Right Home Layout: One level Home  Equipment: Gilford Rile - 2 wheels;Cane - single point;Bedside commode;Shower seat      Prior Function Level of Independence: Needs assistance  Gait / Transfers Assistance Needed: pt  ambulates with RW ADL's / Homemaking Assistance Needed: pt dresses himself and requires assistance with bathing Comments: use of cane ~ 1 year   PT Goals (current goals can now be found in the care plan section) Acute Rehab PT Goals Patient Stated Goal: for pt to get stronger and more steady prior to returning home PT Goal Formulation: With family Time For Goal Achievement: 03/02/17 Potential to Achieve Goals: Fair Progress towards PT goals: Progressing toward goals    Frequency    Min 4X/week      PT Plan Current plan remains appropriate    Co-evaluation              AM-PAC PT "6 Clicks" Daily Activity  Outcome Measure  Difficulty turning over in bed (including adjusting bedclothes, sheets and blankets)?: A Lot Difficulty moving from lying on back to sitting on the side of the bed? : A Lot Difficulty sitting down on and standing up from a chair with arms (e.g., wheelchair, bedside commode, etc,.)?: Total Help needed moving to and from a bed to chair (including a wheelchair)?: A Lot Help needed walking in hospital room?: A Lot Help needed climbing 3-5 steps with a railing? : Total 6 Click Score: 10    End of Session Equipment Utilized During Treatment: Gait belt Activity Tolerance: Patient tolerated treatment well Patient left: in chair;with call bell/phone within reach;with chair alarm set;with family/visitor present Nurse Communication: Mobility status PT Visit Diagnosis: Unsteadiness on feet (R26.81);Other abnormalities of gait and mobility (R26.89);Other symptoms and signs involving the nervous system (R29.898)     Time: 8502-7741 PT Time Calculation (min) (ACUTE ONLY): 24 min  Charges:  $Therapeutic Activity: 23-37 mins                    G Codes:       [place], [place], [place] 02/17/2017, 11:16 AM

## 2017-02-17 NOTE — Progress Notes (Signed)
Spoke with Dr. Doyle Askew re: consult for Joanna, to clarify issues. Pt has been seen by PMT in the past. Pt now qualified for CIR. Dr. Doyle Askew said to DC consult and will re-consult [place] if needed. Will sign off. Thank you, Romona Curls, ANP

## 2017-02-17 NOTE — Progress Notes (Signed)
Rehab Admissions Coordinator Note:  Patient was screened by Cleatrice Burke for appropriateness for an Inpatient Acute Rehab Consult per PT and OT recommendations.  At this time, we are recommending Inpatient Rehab consult.  Cleatrice Burke 02/17/2017, 11:25 AM  I can be reached at [phone].

## 2017-02-17 NOTE — Progress Notes (Signed)
Patient ID: Thomas [name], male   DOB: [birth year], 83 y.o.   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 .   Assessment & Plan:   Stroke, posterior circulation, right cerebellum, multifocal areas in brain stem  Resultant  slurred speech, right facial droop  MRI head continued progression of severe vertebrobasilar system stenosis, loss of basial artery flow, lack of bilateral PCA flow, acute on subacute post circulation infarcts, multifocal new involvement of the brainstem  2D Echo  recent one done 12/2016 with stable EF 50-55%, sclerotic aortic valve   LDL 91  PT eval done, inpatient rehab recommended, order placed   Speech still slurred, SLP in progress, ? If we can try to advance diet   Hypertension, accelerated - Permissive HTN and gradually normalize  - SBP down from 200's --> 180's this AM, this is reasonable  - keep on Norvasc   UTI, enterobacter - keep on Levaquin day #2  Hyperlipidemia - LDL 91  Hypokalemia - supplemented but no blood work this AM - will ask for BMP  DVT prophylaxis: Eliquis  Code Status: DNR Family Communication: no family at bedside   Disposition Plan: to be determined, ? IR  Consultants:   Neurology  PCT  PT/OT/SLP  Procedures:   None  Antimicrobials:   Levaquin 7/6 -->  Subjective: Pt reports feeling better.  Objective: Vitals:   02/16/17 2138 02/17/17 0102 02/17/17 0504 02/17/17 0903  BP: (!) 159/89 139/87 (!) 196/93 (!) 182/88  Pulse: 89 70 75 83  Resp: 18 20 18 20   Temp: 98.5 F (36.9 C) 98.4 F (36.9 C) 98.4 F (36.9 C) 98.8 F (37.1 C)    TempSrc: Oral Oral Oral Oral  SpO2: 100% 100% 99% 100%  Weight:      Height:        Intake/Output Summary (Last 24 hours) at 02/17/17 1149 Last data filed at 02/17/17 0500  Gross per 24 hour  Intake              750 ml  Output              275 ml  Net              475 ml   Filed Weights   02/15/17 1900 02/15/17 2250  Weight: 72.7 kg (160 lb 4.4 oz) 74 kg (163 lb 3.2 oz)    Physical Exam  Constitutional: Appears calm, NAD CVS: RRR, no gallops, no carotid bruit.  Pulmonary: Effort and breath sounds normal, no stridor, rhonchi, wheezes, rales.  Abdominal: Soft. BS +,  no distension, tenderness, rebound or guarding.  Neuro: Alert. Follows commands, right facial droop still present, speech better but still slurred   Data Reviewed: I have personally reviewed following labs and imaging studies  CBC:  Recent Labs Lab 02/15/17 1911 02/15/17 1917  WBC 7.6  --   NEUTROABS 5.2  --   HGB 12.7* 13.3  HCT 39.8 39.0  MCV 88.8  --   PLT 267  --    Basic Metabolic Panel:  Recent Labs Lab 02/15/17 1911 02/15/17 1917  NA 136 142  K 3.0* 3.1*  CL 105 106  CO2 16*  --   GLUCOSE 153* 153*  BUN 17 20  CREATININE 1.31* 1.10  CALCIUM 9.1  --    Liver Function Tests:  Recent Labs Lab 02/15/17 1911  AST 22  ALT 14*  ALKPHOS 80  BILITOT 0.8  PROT 7.8  ALBUMIN 3.4*   Coagulation Profile:  Recent Labs Lab 02/15/17 1911  INR 1.35   CBG:  Recent Labs Lab 02/15/17 1911  GLUCAP 150*   Lipid Profile:  Recent Labs  02/16/17 0515  CHOL 134  HDL 26*  LDLCALC 91  TRIG 85  CHOLHDL 5.2   Urine analysis:    Component Value Date/Time   COLORURINE YELLOW 02/15/2017 2152   APPEARANCEUR HAZY (A) 02/15/2017 2152   LABSPEC 1.021 02/15/2017 2152   PHURINE 5.0 02/15/2017 2152   GLUCOSEU NEGATIVE 02/15/2017 2152   HGBUR SMALL (A) 02/15/2017 2152   HGBUR negative 04/20/2010 0000   BILIRUBINUR NEGATIVE 02/15/2017 2152   BILIRUBINUR n 02/12/2017 1609   KETONESUR 5  (A) 02/15/2017 2152   PROTEINUR 30 (A) 02/15/2017 2152   UROBILINOGEN 1.0 02/12/2017 1609   UROBILINOGEN 1.0 07/21/2013 1825   NITRITE POSITIVE (A) 02/15/2017 2152   LEUKOCYTESUR LARGE (A) 02/15/2017 2152   Recent Results (from the past 240 hour(s))  Urine Culture     Status: None   Collection Time: 02/12/17  4:04 PM  Result Value Ref Range Status   Culture ENTEROBACTER CLOACAE  Final   Colony Count Greater than 100,000 CFU/mL  Final   Organism ID, Bacteria ENTEROBACTER CLOACAE  Final      Susceptibility   Enterobacter cloacae -  (no method available)    AMOX/CLAVULANIC >=32 Resistant     PIP/TAZO 8 Sensitive     IMIPENEM 0.5 Sensitive     CEFAZOLIN >=64 Resistant     CEFTRIAXONE <=1 Sensitive     CEFTAZIDIME <=1 Sensitive     CEFEPIME <=1 Sensitive     GENTAMICIN <=1 Sensitive     TOBRAMYCIN <=1 Sensitive     CIPROFLOXACIN <=0.25 Sensitive     LEVOFLOXACIN <=0.12 Sensitive     NITROFURANTOIN 64 Intermediate     TRIMETH/SULFA* <=20 Sensitive      * NR=NOT REPORTABLE,SEE COMMENTORAL therapy:A cefazolin MIC of <32 predicts susceptibility to the oral agents cefaclor,cefdinir,cefpodoxime,cefprozil,cefuroxime,cephalexin,and loracarbef when used for therapy of uncomplicated UTIs due to E.coli,K.pneumomiae,and P.mirabilis. PARENTERAL therapy: A cefazolinMIC of >8 indicates resistance to parenteralcefazolin. An alternate test method must beperformed to confirm susceptibility to parenteralcefazolin.      Radiology Studies: Dg Chest 2 View  Result Date: 02/16/2017 CLINICAL DATA:  CVA EXAM: CHEST  2 VIEW COMPARISON:  01/14/2017 FINDINGS: Complete opacification of the left hemithorax again noted, stable. Right lung is clear. Heart is mildly enlarged. No change since prior study. IMPRESSION: Stable complete opacification of the left hemithorax, stable. Electronically Signed   By: Rolm Baptise M.D.   On: 02/16/2017 11:03   Mr Brain Wo Contrast  Addendum Date: 02/16/2017   ADDENDUM REPORT:  02/16/2017 12:05 ADDENDUM: Study discussed by telephone with Dr. Doyle Askew On 02/16/2017 at 12:03 . Electronically Signed   By: Genevie Ann M.D.   On: 02/16/2017 12:05   Result Date: 02/16/2017 CLINICAL DATA:  83 year old male with new onset slurred speech and facial droop since yesterday. Right side posterior circulation infarcts in May and June. EXAM: MRI HEAD WITHOUT CONTRAST MRA HEAD WITHOUT CONTRAST TECHNIQUE: Multiplanar, multiecho pulse sequences of the brain and surrounding structures  were obtained without intravenous contrast. Angiographic images of the head were obtained using MRA technique without contrast. COMPARISON:  Brain MRI 01/22/2017 and earlier. CTA head and neck 01/05/2017. Intracranial MRA 12/14/2016. FINDINGS: MRI HEAD FINDINGS Brain: New restricted diffusion in the right lateral pons and left paracentral pons. New small foci of cortical restricted diffusion in the inferior left occipital lobe, and at the right temporal lobe tip. New patchy areas of restricted diffusion in the right cerebellar hemisphere, including the right cerebellar peduncle, corresponding to areas which were relatively spared by the large right inferior cerebellar infarct on 01/06/2017. There is also punctate restricted diffusion in the left thalamus. Continued restricted diffusion in the dorsal right medulla, and right ventral midbrain. No anterior circulation restricted diffusion identified. Associated T2 and FLAIR hyperintensity in the acutely affected areas with no associated hemorrhage or mass effect. Developing encephalomalacia in the right cerebellar hemisphere and both occipital poles. Patchy and confluent bilateral cerebral white matter T2 and FLAIR hyperintensity elsewhere is stable. A small area of cortical encephalomalacia in the superior left parietal lobe is stable. No midline shift, mass effect, evidence of mass lesion, ventriculomegaly, extra-axial collection or acute intracranial hemorrhage. Cervicomedullary  junction and pituitary are within normal limits. Vascular: Worsening basilar artery flow void since 01/22/2017. Continued abnormal distal left V4 segment flow void. Other Major intracranial vascular flow voids are stable. Skull and upper cervical spine: Negative. Visualized bone marrow signal is within normal limits. Sinuses/Orbits: Stable an negative. Other: Stable mild mastoid effusions.  Negative scalp soft tissues. MRA HEAD FINDINGS Little antegrade flow in the distal vertebral arteries and basilar artery, appearing similar to but worse than the CTA head and neck on 01/05/2017, and severely progressed loss of basilar artery flow since the MRA on 12/14/2016. Preserved left PICA flow signal. No significant flow in the bilateral PCAs. Antegrade flow in both ICA siphons appears stable since 12/14/2016, although there is mild to moderate left and moderate to severe right ICA siphon stenosis. Both carotid termini remain patent. MCA and ACA origins remain patent. Visible bilateral MCA and ACA branches are stable since 12/14/2016. IMPRESSION: 1. Continued progression of severe vertebrobasilar system stenosis and loss of basilar artery flow since the Head and neck CTA on 12/31/2016. Lack of bilateral PCA flow signal today. 2. Acute on subacute posterior circulation infarcts with fairly extensive new involvement of the right cerebellum, and multifocal new involvement of the brainstem. 3. No associated hemorrhage or mass effect. 4. The anterior circulation is stable the 12/14/2016 MRA, with up to moderate left and severe right ICA siphon stenosis. Electronically Signed: By: Genevie Ann M.D. On: 02/16/2017 11:43   Mr Jodene Nam Head/brain NF Cm  Addendum Date: 02/16/2017   ADDENDUM REPORT: 02/16/2017 12:05 ADDENDUM: Study discussed by telephone with Dr. Doyle Askew On 02/16/2017 at 12:03 . Electronically Signed   By: Genevie Ann M.D.   On: 02/16/2017 12:05   Result Date: 02/16/2017 CLINICAL DATA:  83 year old male with new onset slurred  speech and facial droop since yesterday. Right side posterior circulation infarcts in May and June. EXAM: MRI HEAD WITHOUT CONTRAST MRA HEAD WITHOUT CONTRAST TECHNIQUE: Multiplanar, multiecho pulse sequences of the brain and surrounding structures were obtained without intravenous contrast. Angiographic images of the head were obtained using MRA technique without contrast. COMPARISON:  Brain MRI 01/22/2017 and earlier. CTA head and neck 01/05/2017. Intracranial MRA 12/14/2016. FINDINGS: MRI HEAD FINDINGS Brain: New restricted diffusion in the right lateral pons and left paracentral pons. New small foci of cortical restricted diffusion  in the inferior left occipital lobe, and at the right temporal lobe tip. New patchy areas of restricted diffusion in the right cerebellar hemisphere, including the right cerebellar peduncle, corresponding to areas which were relatively spared by the large right inferior cerebellar infarct on 01/06/2017. There is also punctate restricted diffusion in the left thalamus. Continued restricted diffusion in the dorsal right medulla, and right ventral midbrain. No anterior circulation restricted diffusion identified. Associated T2 and FLAIR hyperintensity in the acutely affected areas with no associated hemorrhage or mass effect. Developing encephalomalacia in the right cerebellar hemisphere and both occipital poles. Patchy and confluent bilateral cerebral white matter T2 and FLAIR hyperintensity elsewhere is stable. A small area of cortical encephalomalacia in the superior left parietal lobe is stable. No midline shift, mass effect, evidence of mass lesion, ventriculomegaly, extra-axial collection or acute intracranial hemorrhage. Cervicomedullary junction and pituitary are within normal limits. Vascular: Worsening basilar artery flow void since 01/22/2017. Continued abnormal distal left V4 segment flow void. Other Major intracranial vascular flow voids are stable. Skull and upper cervical  spine: Negative. Visualized bone marrow signal is within normal limits. Sinuses/Orbits: Stable an negative. Other: Stable mild mastoid effusions.  Negative scalp soft tissues. MRA HEAD FINDINGS Little antegrade flow in the distal vertebral arteries and basilar artery, appearing similar to but worse than the CTA head and neck on 01/05/2017, and severely progressed loss of basilar artery flow since the MRA on 12/14/2016. Preserved left PICA flow signal. No significant flow in the bilateral PCAs. Antegrade flow in both ICA siphons appears stable since 12/14/2016, although there is mild to moderate left and moderate to severe right ICA siphon stenosis. Both carotid termini remain patent. MCA and ACA origins remain patent. Visible bilateral MCA and ACA branches are stable since 12/14/2016. IMPRESSION: 1. Continued progression of severe vertebrobasilar system stenosis and loss of basilar artery flow since the Head and neck CTA on 12/31/2016. Lack of bilateral PCA flow signal today. 2. Acute on subacute posterior circulation infarcts with fairly extensive new involvement of the right cerebellum, and multifocal new involvement of the brainstem. 3. No associated hemorrhage or mass effect. 4. The anterior circulation is stable the 12/14/2016 MRA, with up to moderate left and severe right ICA siphon stenosis. Electronically Signed: By: Genevie Ann M.D. On: 02/16/2017 11:43   Ct Head Code Stroke W/o Cm  Result Date: 02/15/2017 CLINICAL DATA:  Code stroke. Slurred speech, RIGHT facial droop. Last seen normal at 1800 hours. History of hypertension, prostate cancer, diabetes, stroke. EXAM: CT HEAD WITHOUT CONTRAST TECHNIQUE: Contiguous axial images were obtained from the base of the skull through the vertex without intravenous contrast. COMPARISON:  MRI of the head January 22, 2017 and CT HEAD January 18, 2017. FINDINGS: BRAIN: No intraparenchymal hemorrhage, mass effect nor midline shift. Mesial LEFT parietoccipital and RIGHT occipital  lobe encephalomalacia. Old RIGHT cerebellar infarcts. Old RIGHT basal ganglia lacunar infarct versus lacunar infarct. Old small LEFT parietal vertex infarct. Confluent supratentorial white matter hypodensities. No midline shift, mass effect or acute large vascular territory infarct. No abnormal extra-axial fluid collections. Basal cisterns are patent. VASCULAR: Moderate to severe calcific atherosclerosis of the carotid siphons. SKULL: No skull fracture. No significant scalp soft tissue swelling. SINUSES/ORBITS: The mastoid air-cells and included paranasal sinuses are well-aerated.The included ocular globes and orbital contents are non-suspicious. OTHER: None. ASPECTS [facility] Stroke Program Early CT Score) - Ganglionic level infarction (caudate, lentiform nuclei, internal capsule, insula, M1-M3 cortex): 7 - Supraganglionic infarction (M4-M6 cortex): 3 Total score (0-10 with  10 being normal): 10 IMPRESSION: 1. No acute intracranial process. 2. Old bilateral PCA territory and RIGHT posterior-inferior cerebellar artery territory infarcts. Old small LEFT parietal lobe infarct. Old basal ganglia lacunar infarcts. 3. Severe chronic small vessel ischemic disease. 4. ASPECTS is 10. Critical Value/emergent results were called by telephone at the time of interpretation on 02/15/2017 at 7:30 pm to Dr. Paschal Dopp, Neurology, who verbally acknowledged these results. Electronically Signed   By: Elon Alas M.D.   On: 02/15/2017 19:34    Scheduled Meds: . amLODipine  5 mg Oral Daily  . apixaban  5 mg Oral BID  . aspirin EC  81 mg Oral Daily  . cloNIDine  0.1 mg Oral BID  . QUEtiapine  25 mg Oral QHS  . rosuvastatin  20 mg Oral Daily   Continuous Infusions: . sodium chloride 50 mL/hr at 02/17/17 0037  . levofloxacin (LEVAQUIN) IV Stopped (02/16/17 2116)     LOS: 2 days   Time spent: 25 minutes   Faye Ramsay, MD [facility] Pager [phone]  If 7PM-7AM, please contact  night-coverage [email or web address] Password [email or web address] 02/17/2017, 11:49 AM

## 2017-02-17 NOTE — Progress Notes (Signed)
Patient BP 191/107, rechecked BP in 15 mins 206/104 [facility] team notified. MD. Wants a call back if BP is greater than 220/110. Will continue to monitor and treat.

## 2017-02-18 LAB — CBC
HEMATOCRIT: 38 % — AB (ref 39.0–52.0)
HEMOGLOBIN: 12.4 g/dL — AB (ref 13.0–17.0)
MCH: 28.6 pg (ref 26.0–34.0)
MCHC: 32.6 g/dL (ref 30.0–36.0)
MCV: 87.6 fL (ref 78.0–100.0)
Platelets: 287 10*3/uL (ref 150–400)
RBC: 4.34 MIL/uL (ref 4.22–5.81)
RDW: 16.5 % — ABNORMAL HIGH (ref 11.5–15.5)
WBC: 7.8 10*3/uL (ref 4.0–10.5)

## 2017-02-18 LAB — BASIC METABOLIC PANEL
ANION GAP: 12 (ref 5–15)
BUN: 11 mg/dL (ref 6–20)
CO2: 17 mmol/L — AB (ref 22–32)
Calcium: 8.6 mg/dL — ABNORMAL LOW (ref 8.9–10.3)
Chloride: 108 mmol/L (ref 101–111)
Creatinine, Ser: 1.13 mg/dL (ref 0.61–1.24)
GFR calc Af Amer: >60 mL/min (ref >60)
GFR calc non Af Amer: 56 mL/min — ABNORMAL LOW (ref >60)
GLUCOSE: 118 mg/dL — AB (ref 65–99)
POTASSIUM: 2.8 mmol/L — AB (ref 3.5–5.1)
Sodium: 137 mmol/L (ref 135–145)

## 2017-02-18 LAB — MAGNESIUM: Magnesium: 2 mg/dL (ref 1.7–2.4)

## 2017-02-18 MED ORDER — POTASSIUM CHLORIDE 20 MEQ PO PACK
40.0000 meq | PACK | Freq: Once | ORAL | Status: AC
  Administered 2017-02-18: 40 meq via ORAL
  Filled 2017-02-18: qty 2

## 2017-02-18 MED ORDER — LEVOFLOXACIN 500 MG PO TABS
250.0000 mg | ORAL_TABLET | Freq: Every day | ORAL | Status: DC
  Administered 2017-02-18: 250 mg via ORAL
  Filled 2017-02-18: qty 1

## 2017-02-18 MED ORDER — POTASSIUM CHLORIDE 10 MEQ/100ML IV SOLN
10.0000 meq | INTRAVENOUS | Status: DC
  Filled 2017-02-18 (×6): qty 100

## 2017-02-18 NOTE — Progress Notes (Signed)
Patient ID: Thomas [name], male   DOB: [birth year], 83 y.o.   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 .   Assessment & Plan:   Stroke, posterior circulation, right cerebellum, multifocal areas in brain stem  Resultant  slurred speech, right facial droop  MRI head continued progression of severe vertebrobasilar system stenosis, loss of basial artery flow, lack of bilateral PCA flow, acute on subacute post circulation infarcts, multifocal new involvement of the brainstem  2D Echo  recent one done 12/2016 with stable EF 50-55%, sclerotic aortic valve   LDL 91  PT eval done, inpatient rehab recommended, order placed   SLP in progress, so far tolerating dys I diet  Hypertension, accelerated - Permissive HTN and gradually normalize  - SBP down from 200's --> 180's --> 170's this AM, this is reasonable  - keep on Norvasc   UTI, enterobacter - keep on Levaquin day #3/5  Hyperlipidemia - LDL 91  Hypokalemia - supplement and check  Mg level  - BMP in AM  DVT prophylaxis: Eliquis  Code Status: DNR Family Communication: family at bedside  Disposition Plan: to be determined, ? IR  Consultants:   Neurology  PCT  PT/OT/SLP  Procedures:   None  Antimicrobials:   Levaquin 7/6 -->  Subjective: Pt asking to eat more.   Objective: Vitals:   02/18/17 0113 02/18/17 0518 02/18/17 0907 02/18/17 1326  BP: 137/87 (!) 183/98 (!) 190/94 (!) 170/90  Pulse: (!) 57 71 77 83  Resp: 20 20 18 18   Temp: 97.7 F (36.5 C) 98.1 F (36.7 C) 98.4 F (36.9 C) 98.5 F (36.9 C)  TempSrc: Oral Oral Oral Oral    SpO2: 100% 100% 100% 99%  Weight:      Height:        Intake/Output Summary (Last 24 hours) at 02/18/17 1639 Last data filed at 02/18/17 1400  Gross per 24 hour  Intake             1720 ml  Output                0 ml  Net             1720 ml   Filed Weights   02/15/17 1900 02/15/17 2250  Weight: 72.7 kg (160 lb 4.4 oz) 74 kg (163 lb 3.2 oz)    Physical Exam  Constitutional: Appears calm, NAD CVS: RRR, S1/S2 +, no gallops, no carotid bruit.  Pulmonary: Effort and breath sounds normal, no stridor Abdominal: Soft. BS +,  no distension, tenderness, rebound or guarding.  Musculoskeletal: Normal range of motion. No edema and no tenderness.  Lymphadenopathy: No lymphadenopathy noted, cervical, inguinal. Neuro: Alert. Normal reflexes, slurred speech and right facial droop   Data Reviewed: I have personally reviewed following labs and imaging studies  CBC:  Recent Labs Lab 02/15/17 1911 02/15/17 1917 02/17/17 1213 02/18/17 0352  WBC 7.6  --  9.2 7.8  NEUTROABS 5.2  --   --   --   HGB 12.7* 13.3 12.2* 12.4*  HCT 39.8 39.0 37.1* 38.0*  MCV 88.8  --  88.3  87.6  PLT 267  --  268 809   Basic Metabolic Panel:  Recent Labs Lab 02/15/17 1911 02/15/17 1917 02/17/17 1213 02/18/17 0352  NA 136 142 136 137  K 3.0* 3.1* 3.0* 2.8*  CL 105 106 109 108  CO2 16*  --  15* 17*  GLUCOSE 153* 153* 136* 118*  BUN 17 20 14 11   CREATININE 1.31* 1.10 1.04 1.13  CALCIUM 9.1  --  8.6* 8.6*   Liver Function Tests:  Recent Labs Lab 02/15/17 1911  AST 22  ALT 14*  ALKPHOS 80  BILITOT 0.8  PROT 7.8  ALBUMIN 3.4*   Coagulation Profile:  Recent Labs Lab 02/15/17 1911  INR 1.35   CBG:  Recent Labs Lab 02/15/17 1911  GLUCAP 150*   Lipid Profile:  Recent Labs  02/16/17 0515  CHOL 134  HDL 26*  LDLCALC 91  TRIG 85  CHOLHDL 5.2   Urine analysis:    Component Value Date/Time   COLORURINE YELLOW 02/15/2017 2152   APPEARANCEUR HAZY (A) 02/15/2017 2152   LABSPEC  1.021 02/15/2017 2152   PHURINE 5.0 02/15/2017 2152   GLUCOSEU NEGATIVE 02/15/2017 2152   HGBUR SMALL (A) 02/15/2017 2152   HGBUR negative 04/20/2010 0000   BILIRUBINUR NEGATIVE 02/15/2017 2152   BILIRUBINUR n 02/12/2017 1609   KETONESUR 5 (A) 02/15/2017 2152   PROTEINUR 30 (A) 02/15/2017 2152   UROBILINOGEN 1.0 02/12/2017 1609   UROBILINOGEN 1.0 07/21/2013 1825   NITRITE POSITIVE (A) 02/15/2017 2152   LEUKOCYTESUR LARGE (A) 02/15/2017 2152   Recent Results (from the past 240 hour(s))  Urine Culture     Status: None   Collection Time: 02/12/17  4:04 PM  Result Value Ref Range Status   Culture ENTEROBACTER CLOACAE  Final   Colony Count Greater than 100,000 CFU/mL  Final   Organism ID, Bacteria ENTEROBACTER CLOACAE  Final      Susceptibility   Enterobacter cloacae -  (no method available)    AMOX/CLAVULANIC >=32 Resistant     PIP/TAZO 8 Sensitive     IMIPENEM 0.5 Sensitive     CEFAZOLIN >=64 Resistant     CEFTRIAXONE <=1 Sensitive     CEFTAZIDIME <=1 Sensitive     CEFEPIME <=1 Sensitive     GENTAMICIN <=1 Sensitive     TOBRAMYCIN <=1 Sensitive     CIPROFLOXACIN <=0.25 Sensitive     LEVOFLOXACIN <=0.12 Sensitive     NITROFURANTOIN 64 Intermediate     TRIMETH/SULFA* <=20 Sensitive      * NR=NOT REPORTABLE,SEE COMMENTORAL therapy:A cefazolin MIC of <32 predicts susceptibility to the oral agents cefaclor,cefdinir,cefpodoxime,cefprozil,cefuroxime,cephalexin,and loracarbef when used for therapy of uncomplicated UTIs due to E.coli,K.pneumomiae,and P.mirabilis. PARENTERAL therapy: A cefazolinMIC of >8 indicates resistance to parenteralcefazolin. An alternate test method must beperformed to confirm susceptibility to parenteralcefazolin.      Radiology Studies: Dg Swallowing Func-speech Pathology  Result Date: 02/17/2017 Objective Swallowing Evaluation: Type of Study: MBS-Modified Barium Swallow Study Patient Details Name: Demont Linford MRN: [id number] Date of Birth: 04/09/1926 Today's  Date: 02/17/2017 Time: SLP Start Time (ACUTE ONLY): 1045-SLP Stop Time (ACUTE ONLY): 1125 SLP Time Calculation (min) (ACUTE ONLY): 40 min Past Medical History: Past Medical History: Diagnosis Date . Adenomatous colon polyp 2009 . Allergy  . Carotid artery occlusion  . Diverticulosis of colon (without mention of hemorrhage)  . ED (erectile dysfunction)  . Esophageal stricture  . Esophagitis  . GERD (gastroesophageal reflux disease)  . Goals of care, counseling/discussion 12/23/2016 . Hiatal hernia  .  Hypertension  . Prostate cancer ([place])  . Unspecified gastritis and gastroduodenitis without mention of hemorrhage  Past Surgical History: Past Surgical History: Procedure Laterality Date . ENDARTERECTOMY Left 03/05/2013  Procedure: ENDARTERECTOMY CAROTID;  Surgeon: Conrad Hartrandt, MD;  Location: [facility];  Service: Vascular;  Laterality: Left; . ESOPHAGOGASTRODUODENOSCOPY (EGD) WITH ESOPHAGEAL DILATION   . PROSTATE SURGERY    Laser surgery HPI: Pt is an 83 y.o. male with PMH: dementia, HTN, multiple prior CVA's, mesothelioma, who presented to ED with right sided facial droop and slurred speech at his SNF on 7/5. Upon hospital admission, daughter reported that patient drinks nectar thick liquids and regular solids at home. He has had two recent MBS during previous hospitalizations, on 5/25 and 6/8, both of which recommended nectar thick liquids and mechanical soft solids due to aspiration of thin liquids and oral and swallow initiation delays.  Subjective: patient alert but appeared fatigued Assessment / Plan / Recommendation [facility] IP CLINICAL IMPRESSIONS 02/17/2017 Clinical Impression Patient presents with a severe oral and mod-severe sensorimotor pharyngeal dysphagia with some impact from patient's current level of alertness(appears fatigued/tired). Patient's oral dysphagia is characterized by lingual manipulation and transit delays, and decreased ability to masticate soft solid textures. Pharyngeal phase is characterized by swallow  initiation delay to vallecular sinus with puree solids, nectar thick liquids, honey thick liquids and mechanical soft solids, and delays to pyriform sinus with thin liquids. Patient did require cues for first and at times second swallow, however he was able to fully clear pharyngeal residuals. Despite significant delays of swallow initiation, he demonstrated good airway protection with honey thick, puree, nectar thick and soft solid textures. He exhibited one instance of flash penetration with nectar thick liquids via cup sips and exhibited consistent flash penetration with thin liquids via spoon and via cup sips. As compared to recent two MBS's, patient's oral dysphagia is worse on today's exam, but he did not exhibit the level of penetration and aspiration with thins as on the previous tests. SLP Visit Diagnosis Dysphagia, oropharyngeal phase (R13.12) Attention and concentration deficit following -- Frontal lobe and executive function deficit following -- Impact on safety and function Moderate aspiration risk   [facility] IP TREATMENT RECOMMENDATION 02/17/2017 Treatment Recommendations Therapy as outlined in treatment plan below   Prognosis 02/17/2017 Prognosis for Safe Diet Advancement Good Barriers to Reach Goals Cognitive deficits Barriers/Prognosis Comment -- [facility] IP DIET RECOMMENDATION 02/17/2017 SLP Diet Recommendations Dysphagia 1 (Puree) solids;Nectar thick liquid Liquid Administration via Spoon Medication Administration Crushed with puree Compensations Minimize environmental distractions;Slow rate;Small sips/bites Postural Changes Seated upright at 90 degrees;Remain semi-upright after after feeds/meals (Comment)   [facility] IP OTHER RECOMMENDATIONS 02/17/2017 Recommended Consults -- Oral Care Recommendations Oral care BID Other Recommendations Order thickener from pharmacy;Prohibited food (jello, ice cream, thin soups);Remove water pitcher   [facility] IP FOLLOW UP RECOMMENDATIONS 02/17/2017 Follow up Recommendations Skilled Nursing  facility   [facility] IP FREQUENCY AND DURATION 02/17/2017 Speech Therapy Frequency (ACUTE ONLY) min 2x/week Treatment Duration 2 weeks      [facility] IP ORAL PHASE 02/17/2017 Oral Phase Impaired Oral - Pudding Teaspoon -- Oral - Pudding Cup -- Oral - Honey Teaspoon Weak lingual manipulation;Lingual/palatal residue;Delayed oral transit Oral - Honey Cup Weak lingual manipulation;Delayed oral transit;Lingual/palatal residue Oral - Nectar Teaspoon Weak lingual manipulation;Delayed oral transit Oral - Nectar Cup Delayed oral transit;Weak lingual manipulation Oral - Nectar Straw NT Oral - Thin Teaspoon Right anterior bolus loss;Weak lingual manipulation;Delayed oral transit Oral - Thin Cup Delayed oral transit;Weak lingual manipulation Oral -  Thin Straw NT Oral - Puree Weak lingual manipulation;Right anterior bolus loss;Delayed oral transit;Lingual/palatal residue Oral - Mech Soft Weak lingual manipulation;Impaired mastication;Delayed oral transit;Lingual/palatal residue Oral - Regular NT Oral - Multi-Consistency NT Oral - Pill NT Oral Phase - Comment --  [facility] IP PHARYNGEAL PHASE 02/17/2017 Pharyngeal Phase Impaired Pharyngeal- Pudding Teaspoon -- Pharyngeal -- Pharyngeal- Pudding Cup -- Pharyngeal -- Pharyngeal- Honey Teaspoon Delayed swallow initiation-vallecula Pharyngeal Material does not enter airway Pharyngeal- Honey Cup Delayed swallow initiation-vallecula;Pharyngeal residue - valleculae Pharyngeal Material does not enter airway Pharyngeal- Nectar Teaspoon Delayed swallow initiation-vallecula Pharyngeal Material does not enter airway Pharyngeal- Nectar Cup Delayed swallow initiation-vallecula;Penetration/Aspiration during swallow;Pharyngeal residue - valleculae Pharyngeal Material enters airway, remains ABOVE vocal cords then ejected out Pharyngeal- Nectar Straw NT Pharyngeal -- Pharyngeal- Thin Teaspoon Penetration/Aspiration during swallow;Delayed swallow initiation-pyriform sinuses;Reduced tongue base retraction;Reduced  airway/laryngeal closure Pharyngeal Material enters airway, remains ABOVE vocal cords then ejected out Pharyngeal- Thin Cup Delayed swallow initiation-pyriform sinuses;Reduced airway/laryngeal closure;Reduced tongue base retraction;Penetration/Aspiration during swallow Pharyngeal Material enters airway, remains ABOVE vocal cords then ejected out Pharyngeal- Thin Straw NT Pharyngeal -- Pharyngeal- Puree Delayed swallow initiation-vallecula;Pharyngeal residue - valleculae Pharyngeal -- Pharyngeal- Mechanical Soft Delayed swallow initiation-vallecula;Pharyngeal residue - valleculae Pharyngeal -- Pharyngeal- Regular NT Pharyngeal -- Pharyngeal- Multi-consistency NT Pharyngeal -- Pharyngeal- Pill -- Pharyngeal -- Pharyngeal Comment --  [facility] IP CERVICAL ESOPHAGEAL PHASE 02/17/2017 Cervical Esophageal Phase WFL Pudding Teaspoon -- Pudding Cup -- Honey Teaspoon -- Honey Cup -- Nectar Teaspoon -- Nectar Cup -- Nectar Straw -- Thin Teaspoon -- Thin Cup -- Thin Straw -- Puree -- Mechanical Soft -- Regular -- Multi-consistency -- Pill -- Cervical Esophageal Comment -- [facility] IP GO 12/14/2016 Functional Assessment Tool Used clinical judgment Functional Limitations Memory Swallow Current Status (O1607) (None) Swallow Goal Status (P7106) (None) Swallow Discharge Status (Y6948) (None) Motor Speech Current Status (N4627) (None) Motor Speech Goal Status (O3500) (None) Motor Speech Goal Status (X3818) (None) Spoken Language Comprehension Current Status (E9937) (None) Spoken Language Comprehension Goal Status (J6967) (None) Spoken Language Comprehension Discharge Status [phone]) (None) Spoken Language Expression Current Status (O1751) (None) Spoken Language Expression Goal Status (W2585) (None) Spoken Language Expression Discharge Status [phone]) (None) Attention Current Status (U2353) (None) Attention Goal Status (I1443) (None) Attention Discharge Status (X5400) (None) Memory Current Status (Q6761) CK Memory Goal Status (P5093) CK Memory  Discharge Status (G9170) CK Voice Current Status (G9171) (None) Voice Goal Status (O6712) (None) Voice Discharge Status (W5809) (None) Other Speech-Language Pathology Functional Limitation Current Status (X8338) (None) Other Speech-Language Pathology Functional Limitation Goal Status (S5053) (None) Other Speech-Language Pathology Functional Limitation Discharge Status [phone]) (None) Sonia Baller, MA, CCC-SLP 02/17/17 4:45 PM               Scheduled Meds: . amLODipine  5 mg Oral Daily  . apixaban  5 mg Oral BID  . aspirin EC  81 mg Oral Daily  . cloNIDine  0.1 mg Oral BID  . levofloxacin  250 mg Oral Daily  . QUEtiapine  25 mg Oral QHS  . rosuvastatin  20 mg Oral Daily   Continuous Infusions: . sodium chloride 50 mL/hr at 02/18/17 1636     LOS: 3 days   Time spent: 25 minutes  Faye Ramsay, MD [facility] Pager [phone]  If 7PM-7AM, please contact night-coverage [email or web address] Password [facility] 02/18/2017, 4:39 PM

## 2017-02-18 NOTE — Progress Notes (Signed)
PHARMACIST - PHYSICIAN COMMUNICATION CONCERNING: Antibiotic IV to Oral Route Change Policy  RECOMMENDATION: This patient is receiving Levaquin by the intravenous route.  Based on criteria approved by the Pharmacy and Therapeutics Committee, the antibiotic(s) is/are being converted to the equivalent oral dose form(s).  Patient is tolerating other oral medications via puree, based on dysphagia diet orders.    DESCRIPTION: These criteria include:  Patient being treated for a respiratory tract infection, urinary tract infection, cellulitis or clostridium difficile associated diarrhea if on metronidazole  The patient is not neutropenic and does not exhibit a GI malabsorption state  The patient is eating (either orally or via tube) and/or has been taking other orally administered medications for a least 24 hours  The patient is improving clinically and has a Tmax < 100.5  If you have questions about this conversion, please contact the Pharmacy Department  [x]   [phone] )  Zacarias Pontes   Bridgett Larsson, PharmD, MS

## 2017-02-18 NOTE — Plan of Care (Signed)
Problem: Education: Goal: Knowledge of secondary prevention will improve Outcome: Progressing Patient and family educated on secondary stroke prevention. Patient and family receptive. Will continue to reinforce education.   Problem: Pain Managment: Goal: General experience of comfort will improve Outcome: Progressing Patient denies pain and discomfort this shift. Will continue with cares.

## 2017-02-19 DIAGNOSIS — D62 Acute posthemorrhagic anemia: Secondary | ICD-10-CM

## 2017-02-19 DIAGNOSIS — F0391 Unspecified dementia with behavioral disturbance: Secondary | ICD-10-CM

## 2017-02-19 DIAGNOSIS — R1310 Dysphagia, unspecified: Secondary | ICD-10-CM

## 2017-02-19 DIAGNOSIS — I651 Occlusion and stenosis of basilar artery: Secondary | ICD-10-CM

## 2017-02-19 DIAGNOSIS — F0390 Unspecified dementia without behavioral disturbance: Secondary | ICD-10-CM

## 2017-02-19 DIAGNOSIS — N390 Urinary tract infection, site not specified: Secondary | ICD-10-CM

## 2017-02-19 DIAGNOSIS — I639 Cerebral infarction, unspecified: Secondary | ICD-10-CM

## 2017-02-19 DIAGNOSIS — F03918 Unspecified dementia, unspecified severity, with other behavioral disturbance: Secondary | ICD-10-CM

## 2017-02-19 DIAGNOSIS — E119 Type 2 diabetes mellitus without complications: Secondary | ICD-10-CM

## 2017-02-19 DIAGNOSIS — K222 Esophageal obstruction: Secondary | ICD-10-CM

## 2017-02-19 LAB — CBC
HCT: 34.4 % — ABNORMAL LOW (ref 39.0–52.0)
Hemoglobin: 11 g/dL — ABNORMAL LOW (ref 13.0–17.0)
MCH: 28.2 pg (ref 26.0–34.0)
MCHC: 32 g/dL (ref 30.0–36.0)
MCV: 88.2 fL (ref 78.0–100.0)
Platelets: 272 10*3/uL (ref 150–400)
RBC: 3.9 MIL/uL — ABNORMAL LOW (ref 4.22–5.81)
RDW: 16.6 % — AB (ref 11.5–15.5)
WBC: 6.7 10*3/uL (ref 4.0–10.5)

## 2017-02-19 LAB — BASIC METABOLIC PANEL
Anion gap: 8 (ref 5–15)
BUN: 10 mg/dL (ref 6–20)
CALCIUM: 8.4 mg/dL — AB (ref 8.9–10.3)
CO2: 21 mmol/L — AB (ref 22–32)
CREATININE: 1.09 mg/dL (ref 0.61–1.24)
Chloride: 112 mmol/L — ABNORMAL HIGH (ref 101–111)
GFR calc Af Amer: >60 mL/min (ref >60)
GFR calc non Af Amer: 58 mL/min — ABNORMAL LOW (ref >60)
GLUCOSE: 133 mg/dL — AB (ref 65–99)
Potassium: 3.2 mmol/L — ABNORMAL LOW (ref 3.5–5.1)
Sodium: 141 mmol/L (ref 135–145)

## 2017-02-19 LAB — MAGNESIUM: Magnesium: 2 mg/dL (ref 1.7–2.4)

## 2017-02-19 MED ORDER — POTASSIUM CHLORIDE CRYS ER 20 MEQ PO TBCR: 40.0000 meq | EXTENDED_RELEASE_TABLET | Freq: Once | ORAL | Status: DC

## 2017-02-19 MED ORDER — POTASSIUM CHLORIDE 20 MEQ PO PACK
40.0000 meq | PACK | Freq: Once | ORAL | Status: AC
  Administered 2017-02-19: 40 meq via ORAL
  Filled 2017-02-19: qty 2

## 2017-02-19 MED ORDER — ASPIRIN 81 MG PO CHEW
81.0000 mg | CHEWABLE_TABLET | Freq: Every day | ORAL | Status: DC
  Administered 2017-02-19 – 2017-02-21 (×3): 81 mg via ORAL
  Filled 2017-02-19 (×3): qty 1

## 2017-02-19 MED ORDER — WHITE PETROLATUM GEL
Status: AC
  Filled 2017-02-19: qty 1

## 2017-02-19 MED ORDER — SULFAMETHOXAZOLE-TRIMETHOPRIM 400-80 MG PO TABS
1.0000 | ORAL_TABLET | Freq: Two times a day (BID) | ORAL | Status: DC
  Administered 2017-02-19 (×2): 1 via ORAL
  Filled 2017-02-19 (×3): qty 1

## 2017-02-19 NOTE — Progress Notes (Signed)
Inpatient Rehabilitation  Please see IP Rehab MD consult for full details; recommending SNF for post acute therapies and care.  Attempted to follow up with patient and family, but patient asleep and no family present at the time of my visit.  Plan to continue attempts to follow up with family.  Please call with questions.   Carmelia Roller., CCC/SLP Admission [place]  Cell [phone] \

## 2017-02-19 NOTE — Consult Note (Signed)
Physical Medicine and Rehabilitation Consult  Reason for Consult: Recurrent strokes with weakness, dysphagia, dysarthria and cognitive deficits.  Referring Physician: Dr. Doyle Askew.    HPI: Thomas [name] is a 83 y.o. RH- male with history of HTN, esophageal stricture, PAF-chronic coumadin, recurrent left pleural effusion, malignant mesothelioma (less than 6 moths life expectancy), progressive dementia, multiple recent stroke with CIR stay 5/29-6/7. He was limited by endurance, episodes of unresponsiveness, medical issues, poor family support and ultimately discharged to SNF. History taken from chart review. He has been home for two weeks and reported to be walking with walker and needed assistance with ADL--family reported to be providing assistance. He was admitted on  02/15/17 with slurred speech, right facial droop and difficulty walking. CT head without acute changes and CTA cancelled as family did not want any aggressive measures.  MRI brain showed continued progression of severe vertebrobasilar system stenosis and loss of basilar artery flow and acute on subacute PCA infarcts with fairly extensive involvement of right cerebellum and brainstem.  Started on IV Levaquin for UTI and diet modified to dysphagia 1, nectar due to severe oral and mod-severe sensorimotor pharyngeal dysphagia. Blood pressures have been labile and bradycardia noted on telemetry. Therapy evaluations done and CIR recommended due to deficits in mobility and ability to carry out ADL tasks.     Review of Systems  HENT: Positive for hearing loss.   Eyes: Negative for double vision.  Respiratory: Negative for cough and shortness of breath.   Cardiovascular: Negative for chest pain.  Gastrointestinal: Negative for abdominal pain and heartburn.  Genitourinary: Negative for urgency.  Musculoskeletal: Negative for joint pain and myalgias.  Skin: Negative for itching and rash.  Neurological: Positive for speech change, focal  weakness and weakness. Negative for dizziness and headaches.  Psychiatric/Behavioral: Positive for memory loss.  All other systems reviewed and are negative.     Past Medical History:  Diagnosis Date  . Adenomatous colon polyp 2009  . Allergy   . Carotid artery occlusion   . Diverticulosis of colon (without mention of hemorrhage)   . ED (erectile dysfunction)   . Esophageal stricture   . Esophagitis   . GERD (gastroesophageal reflux disease)   . Goals of care, counseling/discussion 12/23/2016  . Hiatal hernia   . Hypertension   . Prostate cancer ([place])   . Unspecified gastritis and gastroduodenitis without mention of hemorrhage     Past Surgical History:  Procedure Laterality Date  . ENDARTERECTOMY Left 03/05/2013   Procedure: ENDARTERECTOMY CAROTID;  Surgeon: Conrad West York, MD;  Location: [facility];  Service: Vascular;  Laterality: Left;  . ESOPHAGOGASTRODUODENOSCOPY (EGD) WITH ESOPHAGEAL DILATION    . PROSTATE SURGERY     Laser surgery    Family History  Problem Relation Age of Onset  . Heart disease Father        unclear specifics  . Cancer Mother   . Breast cancer Sister   . Hyperlipidemia Daughter   . Hypertension Daughter   . Hypertension Son    Social History:  reports that he quit smoking about 63 years ago. His smoking use included Cigarettes. He has a 2.00 pack-year smoking history. He has never used smokeless tobacco. He reports that he drinks alcohol. He reports that he does not use drugs. Allergies:  Allergies  Allergen Reactions  . Penicillins Anaphylaxis and Swelling    THROAT SWELLS Has patient had a PCN reaction causing immediate rash, facial/tongue/throat swelling, SOB or lightheadedness with hypotension: Yes  Has patient had a PCN reaction causing severe rash involving mucus membranes or skin necrosis: No Has patient had a PCN reaction that required hospitalization: No Has patient had a PCN reaction occurring within the last 10 years: No If all of the  above answers are "NO", then may proceed with Cephalosporin use.   . Ace Inhibitors Hives   Medications Prior to Admission  Medication Sig Dispense Refill  . acetaminophen (TYLENOL) 325 MG tablet Take 650 mg by mouth every 6 (six) hours as needed for mild pain, fever or headache.     Marland Kitchen amLODipine (NORVASC) 5 MG tablet Take 1 tablet (5 mg total) by mouth daily. 30 tablet 3  . apixaban (ELIQUIS) 5 MG TABS tablet Take 5 mg by mouth 2 (two) times daily.    Marland Kitchen aspirin EC 81 MG EC tablet Take 1 tablet (81 mg total) by mouth daily. (Patient taking differently: Take 81 mg by mouth daily as needed (for chest pain). )    . cloNIDine (CATAPRES) 0.1 MG tablet Take 1 tablet (0.1 mg total) by mouth 2 (two) times daily. 90 tablet 4  . haloperidol (HALDOL) 1 MG tablet Take 1 mg by mouth once as needed for agitation.    . metoCLOPramide (REGLAN) 5 MG tablet Take 1 tablet (5 mg total) by mouth every 8 (eight) hours as needed for nausea. 15 tablet 0  . QUEtiapine (SEROQUEL) 25 MG tablet Take 1 tablet (25 mg total) by mouth at bedtime. 30 tablet 2  . rosuvastatin (CRESTOR) 20 MG tablet Take 1 tablet (20 mg total) by mouth daily.    . cephALEXin (KEFLEX) 500 MG capsule Take 1 capsule (500 mg total) by mouth 2 (two) times daily. 20 capsule 0  . LORazepam (ATIVAN) 0.5 MG tablet Take 1 tablet (0.5 mg total) by mouth every 8 (eight) hours as needed for anxiety, sedation or sleep. (Patient taking differently: Take 0.5 mg by mouth at bedtime. ) 30 tablet 2    Home: [place] expects to be discharged to:: Private residence Living Arrangements: Spouse/significant other, Children Available Help at Discharge: Family, Available 24 hours/day Type of Home: House Home Access: Stairs to enter [employer] of Steps: 2 Entrance Stairs-Rails: Right Home Layout: One level Bathroom Shower/Tub: Tub/shower unit, Door [employer] Toilet: Standard Bathroom Accessibility: Yes Home Equipment: Environmental consultant - 2  wheels, Cane - single point, Bedside commode, Shower seat  Lives With: Spouse, Son  Functional History: Prior Function Level of Independence: Needs assistance Gait / Transfers Assistance Needed: pt ambulates with RW ADL's / Homemaking Assistance Needed: pt dresses himself and requires assistance with bathing Comments: use of cane ~ 1 year Functional Status:  Mobility: Bed Mobility Overal bed mobility: Needs Assistance Bed Mobility: Sidelying to Sit Sidelying to sit: Min guard Supine to sit: Min assist Sit to supine: Min guard General bed mobility comments: increased time and effort, min guard for safety Transfers Overall transfer level: Needs assistance Equipment used: Rolling walker (2 wheeled) Transfers: Sit to/from Stand, [employer] Transfers Sit to Stand: Mod assist, +2 physical assistance Stand pivot transfers: Mod assist, +2 physical assistance General transfer comment: increased time and effort, mod A x2 to rise from bed x1 and from [facility] x1, mod A x2 for pivotal movements Ambulation/Gait Ambulation/Gait assistance: Mod assist Ambulation Distance (Feet): 5 Feet Assistive device: Rolling walker (2 wheeled) Gait Pattern/deviations: Shuffle, Decreased step length - right, Decreased step length - left, Decreased stride length, Narrow base of support General Gait Details: significant instability with  ambulation using RW and difficulty motor planning how to use a familiar AD (pt uses a RW to ambulate at baseline). Pt required mod A to maintain upright standing. Gait velocity: Decreased Gait velocity interpretation: <1.8 ft/sec, indicative of risk for recurrent falls    ADL: ADL Overall ADL's : Needs assistance/impaired Eating/Feeding: Set up, Sitting Grooming: Standing, Minimal assistance, Wash/dry hands, Wash/dry face, Oral care Grooming Details (indicate cue type and reason): total A to apply toothpaste to toothbrush Upper Body Bathing: Sitting, Min guard (simulated) Lower  Body Bathing: Maximal assistance, Sit to/from stand Upper Body Dressing : Min guard, Sitting Lower Body Dressing: Total assistance Toilet Transfer: RW, Minimal assistance, BSC, Stand-pivot, Cueing for safety, Cueing for sequencing Toileting- Clothing Manipulation and Hygiene: Total assistance Functional mobility during ADLs: Rolling walker, Cueing for safety, Cueing for sequencing, Minimal assistance General ADL Comments:  Leaning  to R while on BSC and when standing at sink  Cognition: Cognition Overall Cognitive Status: History of cognitive impairments - at baseline Orientation Level: Oriented to person, Oriented to place, Oriented to situation, Disoriented to time Cognition Arousal/Alertness: Awake/alert Behavior During Therapy: Flat affect Overall Cognitive Status: History of cognitive impairments - at baseline Area of Impairment: Following commands, Safety/judgement, Problem solving Orientation Level: Place, Time Memory: Decreased short-term memory Following Commands: Follows one step commands with increased time Safety/Judgement: Decreased awareness of deficits, Decreased awareness of safety Problem Solving: Slow processing, Difficulty sequencing, Requires verbal cues, Requires tactile cues General Comments: pt with hx of dementia at baseline Difficult to assess due to: Hard of hearing/deaf, Impaired communication   Blood pressure (!) 151/89, pulse 60, temperature 97.9 F (36.6 C), temperature source Oral, resp. rate 18, height 5\' 11"  (1.803 m), weight 74 kg (163 lb 3.2 oz), SpO2 99 %. Physical Exam  Nursing note and vitals reviewed. Constitutional: He appears well-developed and well-nourished. No distress.  HENT:  Head: Normocephalic and atraumatic.  Mouth/Throat: Oropharynx is clear and moist.  Eyes: Conjunctivae and EOM are normal. Pupils are equal, round, and reactive to light.  Neck: Normal range of motion. Neck supple.  Cardiovascular: An irregularly irregular rhythm  present.  Murmur heard. Respiratory: Effort normal and breath sounds normal. No stridor. No respiratory distress. He has no wheezes.  GI: Soft. Bowel sounds are normal. He exhibits no distension. There is no tenderness.  Musculoskeletal: He exhibits no edema or tenderness.  Neurological: He is alert.  HOH.  Right facial weakness with dysarthric speech.  Able to follow simple motor commands.  Has poor insight/awareness of deficits.  Motor: B/l UE 4-/5 proximal to distal RLE: 4-/5 proximal to distal LLE: 3+/5 proximal to distal A&Ox2  Skin: Skin is warm and dry. He is not diaphoretic.  Psychiatric: He has a normal mood and affect. His behavior is normal. His speech is slurred. Cognition and memory are impaired. He expresses inappropriate judgment.    Results for orders placed or performed during the hospital encounter of 02/15/17 (from the past 24 hour(s))  CBC     Status: Abnormal   Collection Time: 02/19/17  1:40 AM  Result Value Ref Range   WBC 6.7 4.0 - 10.5 K/uL   RBC 3.90 (L) 4.22 - 5.81 MIL/uL   Hemoglobin 11.0 (L) 13.0 - 17.0 g/dL   HCT 34.4 (L) 39.0 - 52.0 %   MCV 88.2 78.0 - 100.0 fL   MCH 28.2 26.0 - 34.0 pg   MCHC 32.0 30.0 - 36.0 g/dL   RDW 16.6 (H) 11.5 - 15.5 %  Platelets 272 150 - 400 K/uL  Basic metabolic panel     Status: Abnormal   Collection Time: 02/19/17  1:40 AM  Result Value Ref Range   Sodium 141 135 - 145 mmol/L   Potassium 3.2 (L) 3.5 - 5.1 mmol/L   Chloride 112 (H) 101 - 111 mmol/L   CO2 21 (L) 22 - 32 mmol/L   Glucose, Bld 133 (H) 65 - 99 mg/dL   BUN 10 6 - 20 mg/dL   Creatinine, Ser 1.09 0.61 - 1.24 mg/dL   Calcium 8.4 (L) 8.9 - 10.3 mg/dL   GFR calc non Af Amer 58 (L) >60 mL/min   GFR calc Af Amer >60 >60 mL/min   Anion gap 8 5 - 15  Magnesium     Status: None   Collection Time: 02/19/17  1:40 AM  Result Value Ref Range   Magnesium 2.0 1.7 - 2.4 mg/dL   No results found.  Assessment/Plan: Diagnosis: Severe vertebrobasilar system  stenosis and loss of basilar artery flow and acute on subacute PCA infarcts Labs and images independently reviewed.  Records reviewed and summated above. Stroke: Continue secondary stroke prophylaxis and Risk Factor Modification listed below:   Antiplatelet therapy:   Blood Pressure Management:  Continue current medication with prn's with permisive HTN per primary team Statin Agent:   Diabetes management:   Left > right sided hemiparesis: fit for orthosis to prevent contractures (resting hand splint for day, wrist cock up splint at night, PRAFO, etc) Motor recovery: Fluoxetine  1. Does the need for close, 24 hr/day medical supervision in concert with the patient's rehab needs make it unreasonable for this patient to be served in a less intensive setting? Yes  2. Co-Morbidities requiring supervision/potential complications: progressive dementia, multiple recent stroke, UTI (abx), dysphagia (advance diet as tolerated), Bradycardia (monitor HR with increased activity), HTN (monitor and provide prns in accordance with increased physical exertion and pain), esophageal stricture, PAF (monitor HR with increased activity), recurrent left pleural effusion, malignant mesothelioma (hospice care), DM (Monitor in accordance with exercise and adjust meds as necessary), ABLA (transfuse if necessary to ensure appropriate perfusion for increased activity tolerance) 3. Due to safety, disease management, medication administration and patient education, does the patient require 24 hr/day rehab nursing? Yes 4. Does the patient require coordinated care of a physician, rehab nurse, PT (1-2 hrs/day, 5 days/week), OT (1-2 hrs/day, 5 days/week) and SLP (1-2 hrs/day, 5 days/week) to address physical and functional deficits in the context of the above medical diagnosis(es)? Yes Addressing deficits in the following areas: balance, endurance, locomotion, strength, transferring, bowel/bladder control, bathing, dressing, toileting,  cognition and psychosocial support 5. Can the patient actively participate in an intensive therapy program of at least 3 hrs of therapy per day at least 5 days per week? Yes 6. The potential for patient to make measurable gains while on inpatient rehab is excellent 7. Anticipated functional outcomes upon discharge from inpatient rehab are supervision and min assist  with PT, supervision and min assist with OT, min assist with SLP. 8. Estimated rehab length of stay to reach the above functional goals is: 16-19 days. 9. Anticipated D/C setting: Other 10. Anticipated post D/C treatments: SNF 11. Overall Rehab/Functional Prognosis: good and fair  RECOMMENDATIONS: This patient's condition is appropriate for continued rehabilitative care in the following setting: Patient known to rehab, given comorbidities, activity tolerance, and caregiver support, recommend SNF at this time.  Patient has agreed to participate in recommended program. Potentially Note that insurance prior  authorization may be required for reimbursement for recommended care.  Comment: Rehab Admissions Coordinator to follow up.  Delice Lesch, MD, [place], [place] 02/19/2017

## 2017-02-19 NOTE — Progress Notes (Signed)
Patient ID: Thomas [name], male   DOB: 19-Oct-1925, 83 y.o.   MRN: [id number]    PROGRESS NOTE  Thomas [name]  MGQ:[id number] DOB: Nov 13, 1925 DOA: 02/15/2017  PCP: Marin Olp, MD   Brief Narrative:  Pt is 83 yo male with known dementia, HTN, prior CVA's 5-6 episodes (on Eliquis), hx of prostate cancer treated with seed implants, recent UTI treated with ABX, presented with main concern of slurred speech, right facial droop that was noted several hours prior to admission. .   Assessment & Plan:   Stroke, posterior circulation, right cerebellum, multifocal areas in brain stem  Resultant  slurred speech, right facial droop  MRI head continued progression of severe vertebrobasilar system stenosis, loss of basial artery flow, lack of bilateral PCA flow, acute on subacute post circulation infarcts, multifocal new involvement of the brainstem  2D Echo  recent one done 12/2016 with stable EF 50-55%, sclerotic aortic valve   LDL 91  PT eval done, Rehab MD says SNF more appropriate   SW consulted for assistance   Hypertension, accelerated - Permissive HTN and gradually normalize  - SBP down from 200's --> 180's --> 170's --> 140's this AM, this is reasonable  - keep on Norvasc   UTI, enterobacter - Change Levaquin to Bactrim due to prolonged QRS which is rather uncommon side effect but certainly possible  - will need two more days of ABX  Hyperlipidemia - LDL 91  Hypokalemia - supplement and repeat BMP in AM  DVT prophylaxis: Eliquis  Code Status: DNR Family Communication: no family at bedside  Disposition Plan: SNF if family agrees   Consultants:   Neurology  PCT  PT/OT/SLP  Procedures:   None  Antimicrobials:   Levaquin 7/6 --> 7/9  Bactrim 7/9 --> 7/10  Subjective: Pt reports feeling better.   Objective: Vitals:   02/19/17 0256 02/19/17 0533 02/19/17 0955 02/19/17 1408  BP: (!) 140/100 (!) 159/92 (!) 151/89 (!) 145/91  Pulse: 73 73 60 69  Resp:  20  18 18   Temp: 97.6 F (36.4 C) 98 F (36.7 C) 97.9 F (36.6 C) 98.5 F (36.9 C)  TempSrc: Oral Oral Oral Axillary  SpO2: 100% 99% 99% 100%  Weight:      Height:        Intake/Output Summary (Last 24 hours) at 02/19/17 1415 Last data filed at 02/19/17 1202  Gross per 24 hour  Intake              160 ml  Output                0 ml  Net              160 ml   Filed Weights   02/15/17 1900 02/15/17 2250  Weight: 72.7 kg (160 lb 4.4 oz) 74 kg (163 lb 3.2 oz)    Physical Exam  Constitutional: Appears well-developed and well-nourished. No distress.  CVS: RRR, S1/S2 +, no murmurs, no gallops, no carotid bruit.  Pulmonary: Effort and breath sounds normal, no stridor, rhonchi, wheezes, rales.  Abdominal: Soft. BS +,  no distension, tenderness, rebound or guarding.  Musculoskeletal: Normal range of motion. No edema and no tenderness.  Lymphadenopathy: No lymphadenopathy noted, cervical, inguinal. Neuro: Alert. Right facial droop, aphasia   Data Reviewed: I have personally reviewed following labs and imaging studies  CBC:  Recent Labs Lab 02/15/17 1911 02/15/17 1917 02/17/17 1213 02/18/17 0352 02/19/17 0140  WBC 7.6  --  9.2 7.8 6.7  NEUTROABS 5.2  --   --   --   --   HGB 12.7* 13.3 12.2* 12.4* 11.0*  HCT 39.8 39.0 37.1* 38.0* 34.4*  MCV 88.8  --  88.3 87.6 88.2  PLT 267  --  268 287 884   Basic Metabolic Panel:  Recent Labs Lab 02/15/17 1911 02/15/17 1917 02/17/17 1213 02/18/17 0352 02/19/17 0140  NA 136 142 136 137 141  K 3.0* 3.1* 3.0* 2.8* 3.2*  CL 105 106 109 108 112*  CO2 16*  --  15* 17* 21*  GLUCOSE 153* 153* 136* 118* 133*  BUN 17 20 14 11 10   CREATININE 1.31* 1.10 1.04 1.13 1.09  CALCIUM 9.1  --  8.6* 8.6* 8.4*  MG  --   --   --  2.0 2.0   Liver Function Tests:  Recent Labs Lab 02/15/17 1911  AST 22  ALT 14*  ALKPHOS 80  BILITOT 0.8  PROT 7.8  ALBUMIN 3.4*   Coagulation Profile:  Recent Labs Lab 02/15/17 1911  INR 1.35    CBG:  Recent Labs Lab 02/15/17 1911  GLUCAP 150*   Lipid Profile: No results for input(s): CHOL, HDL, LDLCALC, TRIG, CHOLHDL, LDLDIRECT in the last 72 hours. Urine analysis:    Component Value Date/Time   COLORURINE YELLOW 02/15/2017 2152   APPEARANCEUR HAZY (A) 02/15/2017 2152   LABSPEC 1.021 02/15/2017 2152   PHURINE 5.0 02/15/2017 2152   GLUCOSEU NEGATIVE 02/15/2017 2152   HGBUR SMALL (A) 02/15/2017 2152   HGBUR negative 04/20/2010 0000   BILIRUBINUR NEGATIVE 02/15/2017 2152   BILIRUBINUR n 02/12/2017 1609   KETONESUR 5 (A) 02/15/2017 2152   PROTEINUR 30 (A) 02/15/2017 2152   UROBILINOGEN 1.0 02/12/2017 1609   UROBILINOGEN 1.0 07/21/2013 1825   NITRITE POSITIVE (A) 02/15/2017 2152   LEUKOCYTESUR LARGE (A) 02/15/2017 2152   Recent Results (from the past 240 hour(s))  Urine Culture     Status: None   Collection Time: 02/12/17  4:04 PM  Result Value Ref Range Status   Culture ENTEROBACTER CLOACAE  Final   Colony Count Greater than 100,000 CFU/mL  Final   Organism ID, Bacteria ENTEROBACTER CLOACAE  Final      Susceptibility   Enterobacter cloacae -  (no method available)    AMOX/CLAVULANIC >=32 Resistant     PIP/TAZO 8 Sensitive     IMIPENEM 0.5 Sensitive     CEFAZOLIN >=64 Resistant     CEFTRIAXONE <=1 Sensitive     CEFTAZIDIME <=1 Sensitive     CEFEPIME <=1 Sensitive     GENTAMICIN <=1 Sensitive     TOBRAMYCIN <=1 Sensitive     CIPROFLOXACIN <=0.25 Sensitive     LEVOFLOXACIN <=0.12 Sensitive     NITROFURANTOIN 64 Intermediate     TRIMETH/SULFA* <=20 Sensitive      * NR=NOT REPORTABLE,SEE COMMENTORAL therapy:A cefazolin MIC of <32 predicts susceptibility to the oral agents cefaclor,cefdinir,cefpodoxime,cefprozil,cefuroxime,cephalexin,and loracarbef when used for therapy of uncomplicated UTIs due to E.coli,K.pneumomiae,and P.mirabilis. PARENTERAL therapy: A cefazolinMIC of >8 indicates resistance to parenteralcefazolin. An alternate test method must beperformed to  confirm susceptibility to parenteralcefazolin.      Radiology Studies: No results found.  Scheduled Meds: . amLODipine  5 mg Oral Daily  . apixaban  5 mg Oral BID  . aspirin  81 mg Oral Daily  . cloNIDine  0.1 mg Oral BID  . QUEtiapine  25 mg Oral QHS  . rosuvastatin  20 mg Oral Daily  . sulfamethoxazole-trimethoprim  1 tablet  Oral Q12H   Continuous Infusions:    LOS: 4 days   Time spent:25 minutes   Faye Ramsay, MD [facility] Pager [phone]  If 7PM-7AM, please contact night-coverage [email or web address] Password [email or web address] 02/19/2017, 2:15 PM

## 2017-02-19 NOTE — Clinical Social Work Note (Signed)
Clinical Social Work Assessment  Patient Details  Name: Thomas [name] MRN: [id number] Date of Birth: 06-Apr-1926  Date of referral:  02/19/17               Reason for consult:  Facility Placement                Permission sought to share information with:  Chartered certified accountant granted to share information::  Yes, Verbal Permission Granted  Name::     Visual merchandiser::  SNF  Relationship::  dtr  Contact Information:     Housing/Transportation Living arrangements for the past 2 months:  [place] of Information:  Adult Children Patient Interpreter Needed:  None Criminal Activity/Legal Involvement Pertinent to Current Situation/Hospitalization:  No - Comment as needed Significant Relationships:  Adult Children Lives with:  Self, Significant Other Do you feel safe going back to the place where you live?  Yes Need for family participation in patient care:  Yes (Comment) (help with care coordination)  Care giving concerns:  Pt was recently at [facility] but still with mobility concerns and limited support available at home.  Pt was at SNF where dtr works but pt dtr does not feel comfortable with him returning- said it was stressful having pt at same facility where she worked.  Social Worker assessment / plan:  CSW informed pt dtr that CIR received insurance denial for his admission and that patient could either go to SNF or home with home services from here.    Employment status:  Retired Health visitor PT Recommendations:  Inpatient [place] / Referral to community resources:  [place]  Patient/Family's Response to care:  Dtr unsure of what she wants to do at this time- plans to confer with siblings and get back to CSW with answer- does NOT want referral sent out until she speaks with family.  Patient/Family's Understanding of and Emotional Response to Diagnosis, Current  Treatment, and Prognosis:  No questions or concerns at this time- disappointed that pt cannot go to CIR.  Emotional Assessment Appearance:  Appears stated age Attitude/Demeanor/Rapport:  Unable to Assess Affect (typically observed):  Unable to Assess Orientation:  Oriented to Self, Oriented to Place, Oriented to Situation Alcohol / Substance use:  Not Applicable Psych involvement (Current and /or in the community):  No (Comment)  Discharge Needs  Concerns to be addressed:  Care Coordination Readmission within the last 30 days:  Yes Current discharge risk:  Physical Impairment Barriers to Discharge:  Continued Medical Work up   Jorge Ny, LCSW 02/19/2017, 3:30 PM

## 2017-02-19 NOTE — Progress Notes (Signed)
Central Telemetry called.  At 0052 , HR 27 Conduction Changes: widened QRS, back to normal within seconds. BP: 176/101 with no PRN.  Will page [facility] and make them aware.  5M03 [name],Thomas, HR went down to 27 with Widened QRS, back to the 50s rapidly. BP 176/101. There are no PRNs ordered. Thank you.

## 2017-02-19 NOTE — Care Management Important Message (Signed)
Important Message  Patient Details  Name: Donavan Kerlin MRN: [id number] Date of Birth: 12-26-1925   Medicare Important Message Given:  Yes    Loraine Freid 02/19/2017, 2:58 PM

## 2017-02-19 NOTE — Progress Notes (Signed)
MD called back with orders: stat EKG, BMet and Mag levels. Will continue to monitor.

## 2017-02-19 NOTE — Progress Notes (Signed)
Occupational Therapy Treatment Patient Details Name: Thomas [name] MRN: [id number] DOB: [birth year] Today's Date: 02/19/2017    History of present illness Pt is an 83 y/o male admitted secondary to slurred speech and facial droop. 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 PMH including but not limited to dementia, HTN and previous CVAs.   OT comments  Pt progressing well toward OT goals. Pt was able to stand at sink with mod assist for balance and complete grooming tasks with minimal assistance. He was able to demonstrate ability to complete toilet transfers with multimodal cues and mod assist with chair follow for safety. Pt requesting water following ADL participation and was assisted to drink thickened water (to nectar consistency per SLP) via spoon. However, pt with coughing/choking nearly immediately. RN notified and monitoring pt. D/C recommendation remains appropriate. Will continue to follow while admitted.    Follow Up Recommendations  CIR;Supervision/Assistance - 24 hour    Equipment Recommendations  None recommended by OT    Recommendations for Other Services Rehab consult    Precautions / Restrictions Precautions Precautions: Fall Restrictions Weight Bearing Restrictions: No       Mobility Bed Mobility Overal bed mobility: Needs Assistance Bed Mobility: Sidelying to Sit;Rolling Rolling: Min guard Sidelying to sit: Min assist Supine to sit: Min assist Sit to supine: Min guard   General bed mobility comments: Increased time and effort with min assist to lift trunk from bed.   Transfers Overall transfer level: Needs assistance Equipment used: Rolling walker (2 wheeled) Transfers: Sit to/from [facility] Sit to Stand: Min assist         General transfer comment: Min assist to power up to standing with cues for hand placement  and safety    Balance Overall balance assessment: Needs assistance Sitting-balance support: Feet supported Sitting balance-Leahy Scale: Fair Sitting balance - Comments: able to sit EOB with min guard Postural control:  (modest right list) Standing balance support: During functional activity;Single extremity supported Standing balance-Leahy Scale: Poor Standing balance comment: pt reliant on at least one UE support and external assist during functional task performance at sink                           ADL either performed or assessed with clinical judgement   ADL Overall ADL's : Needs assistance/impaired Eating/Feeding: Sitting;Minimal assistance (see diet information) Eating/Feeding Details (indicate cue type and reason): Pt assisted with nectar thick liquid via spoon with significant coughing/choking. RN notified and monitoring.  Grooming: Standing;Minimal assistance;Wash/dry hands;Wash/dry face;Oral care Grooming Details (indicate cue type and reason): Mod assist for balance when without B UE support.                  Toilet Transfer: Moderate assistance;+2 for safety/equipment;RW;Ambulation;Cueing for safety;Cueing for sequencing Toilet Transfer Details (indicate cue type and reason): VC's for use of RW and to take approrpiately sized steps.         Functional mobility during ADLs: Cueing for safety;Cueing for sequencing;Moderate assistance;+2 for safety/equipment;Rolling walker General ADL Comments: Decreased coordination impacting safety and independence with RW use.      Vision   Additional Comments: Will continue to assess. Able to locate objects in B visual fields.    Perception     Praxis      Cognition Arousal/Alertness: Awake/alert Behavior During Therapy: Flat affect Overall Cognitive Status: History of cognitive  impairments - at baseline Area of Impairment: Following commands;Memory;Safety/judgement;Awareness;Problem solving                  Orientation Level: Place;Time Current Attention Level: Selective Memory: Decreased short-term memory Following Commands: Follows one step commands with increased time Safety/Judgement: Decreased awareness of deficits;Decreased awareness of safety Awareness: Emergent Problem Solving: Slow processing;Difficulty sequencing;Requires verbal cues;Requires tactile cues General Comments: pt with hx of dementia at baseline        Exercises     Shoulder Instructions       General Comments Pt assisted with nectar thick liquid by spoon and began to cough/choke on thickened liquid. RN notified and monitoring pt.     Pertinent Vitals/ Pain       Pain Assessment: No/denies pain  Home Living                                          Prior Functioning/Environment              Frequency  Min 3X/week        Progress Toward Goals  OT Goals(current goals can now be found in the care plan section)  Progress towards OT goals: Progressing toward goals  Acute Rehab OT Goals Patient Stated Goal: for pt to get stronger and more steady prior to returning home OT Goal Formulation: With patient/family Time For Goal Achievement: 02/24/17 Potential to Achieve Goals: Good  Plan Discharge plan remains appropriate    Co-evaluation      Reason for Co-Treatment: Complexity of the patient's impairments (multi-system involvement);For patient/therapist safety PT goals addressed during session: Mobility/safety with mobility;Balance OT goals addressed during session: ADL's and self-care      AM-PAC PT "6 Clicks" Daily Activity     Outcome Measure   Help from another person eating meals?: A Little Help from another person taking care of personal grooming?: A Lot Help from another person toileting, which includes using toliet, bedpan, or urinal?: A Lot Help from another person bathing (including washing, rinsing, drying)?: A Lot Help from another person to put on and  taking off regular upper body clothing?: A Lot Help from another person to put on and taking off regular lower body clothing?: A Lot 6 Click Score: 13    End of Session Equipment Utilized During Treatment: Gait belt;Rolling walker  OT Visit Diagnosis: Muscle weakness (generalized) (M62.81);Other abnormalities of gait and mobility (R26.89);Unsteadiness on feet (R26.81);Other symptoms and signs involving cognitive function   Activity Tolerance Patient tolerated treatment well   Patient Left in chair;with call bell/phone within reach;with chair alarm set   Nurse Communication          Time: 4166-0630 OT Time Calculation (min): 25 min  Charges: OT General Charges $OT Visit: 1 Procedure OT Treatments $Self Care/Home Management : 8-22 mins  Norman Herrlich, MS OTR/L  Pager: [place] A Jeric Slagel 02/19/2017, 5:26 PM

## 2017-02-19 NOTE — Progress Notes (Signed)
Physical Therapy Treatment Patient Details Name: Thomas [name] MRN: [id number] DOB: 09/14/1925 Today's Date: 02/19/2017    History of Present Illness Pt is an 83 y/o male admitted secondary to slurred speech and facial droop. 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 PMH including but not limited to dementia, HTN and previous CVAs.    PT Comments    Patient seen for mobility progression in conjunction with functional task performance with OT therapist. Patient able to perform in room ambulation with RW and moderate assist as well as ADL activity at sink. Patient then tolerated gait re-training 55ft with multi modal cueing/moderate assist and +2 chair follow. After ambulation, patient requesting drink of water, provided thickened water and began to cough immediately. Noted multiple bouts of wet cough despite removal of drink. Nsg notified. Will continue to see and progress as tolerated.   Follow Up Recommendations  CIR     Equipment Recommendations  None recommended by PT    Recommendations for Other Services Rehab consult     Precautions / Restrictions Precautions Precautions: Fall Restrictions Weight Bearing Restrictions: No    Mobility  Bed Mobility Overal bed mobility: Needs Assistance Bed Mobility: Sidelying to Sit   Sidelying to sit: Min guard Supine to sit: Min assist Sit to supine: Min guard   General bed mobility comments: increased time and effort, min guard for safety  Transfers Overall transfer level: Needs assistance Equipment used: Rolling walker (2 wheeled) Transfers: Sit to/from [facility] Sit to Stand: Min assist         General transfer comment: min assist to power up to standing with cues for hand placement and safety  Ambulation/Gait Ambulation/Gait assistance: Mod assist Ambulation Distance (Feet): 50  Feet Assistive device: Rolling walker (2 wheeled) Gait Pattern/deviations: Shuffle;Decreased step length - right;Decreased step length - left;Decreased stride length;Narrow base of support Gait velocity: Decreased Gait velocity interpretation: <1.8 ft/sec, indicative of risk for recurrent falls General Gait Details: Max cues for increased stride and assist for upright posture and positioning, fluctuation between min and moderate assist with +2 chair follow for safety as patient LEs appear to fatigue   Stairs            Wheelchair Mobility    Modified Rankin (Stroke Patients Only) Modified Rankin (Stroke Patients Only) Pre-Morbid Rankin Score: Moderate disability Modified Rankin: Severe disability     Balance Overall balance assessment: Needs assistance Sitting-balance support: Feet supported Sitting balance-Leahy Scale: Fair Sitting balance - Comments: able to sit EOB with min guard Postural control:  (modest right list) Standing balance support: During functional activity;Single extremity supported Standing balance-Leahy Scale: Poor Standing balance comment: pt reliant on at least one UE support and external assist during functional task performance at sink                            Cognition Arousal/Alertness: Awake/alert Behavior During Therapy: Flat affect Overall Cognitive Status: History of cognitive impairments - at baseline Area of Impairment: Following commands;Safety/judgement;Problem solving                 Orientation Level: Place;Time Current Attention Level: Selective Memory: Decreased short-term memory Following Commands: Follows one step commands with increased time Safety/Judgement: Decreased awareness of deficits;Decreased awareness of safety Awareness: Emergent Problem Solving: Slow processing;Difficulty sequencing;Requires verbal cues;Requires tactile cues General Comments: pt with hx of dementia at  baseline      Exercises       General Comments General comments (skin integrity, edema, etc.): patient given some necter think liquid by spoon and starting coughing/choking on thickened liquid, nsg aware      Pertinent Vitals/Pain Pain Assessment: No/denies pain    Home Living                      Prior Function            PT Goals (current goals can now be found in the care plan section) Acute Rehab PT Goals Patient Stated Goal: for pt to get stronger and more steady prior to returning home PT Goal Formulation: With family Time For Goal Achievement: 03/02/17 Potential to Achieve Goals: Fair Progress towards PT goals: Progressing toward goals    Frequency    Min 4X/week      PT Plan Current plan remains appropriate    Co-evaluation PT/OT/SLP Co-Evaluation/Treatment: Yes Reason for Co-Treatment: Complexity of the patient's impairments (multi-system involvement);For patient/therapist safety PT goals addressed during session: Mobility/safety with mobility;Balance OT goals addressed during session: ADL's and self-care      AM-PAC PT "6 Clicks" Daily Activity  Outcome Measure  Difficulty turning over in bed (including adjusting bedclothes, sheets and blankets)?: Total Difficulty moving from lying on back to sitting on the side of the bed? : Total Difficulty sitting down on and standing up from a chair with arms (e.g., wheelchair, bedside commode, etc,.)?: Total Help needed moving to and from a bed to chair (including a wheelchair)?: A Lot Help needed walking in hospital room?: A Lot Help needed climbing 3-5 steps with a railing? : Total 6 Click Score: 8    End of Session Equipment Utilized During Treatment: Gait belt Activity Tolerance: Patient tolerated treatment well Patient left: in chair;with call bell/phone within reach;with chair alarm set Nurse Communication: Mobility status PT Visit Diagnosis: Unsteadiness on feet (R26.81);Other abnormalities of gait and mobility  (R26.89);Other symptoms and signs involving the nervous system (L79.892)     Time: 1194-1740 PT Time Calculation (min) (ACUTE ONLY): 23 min  Charges:  $Gait Training: 8-22 mins                    G Codes:       [name], PT DPT [facility] [place] 02/19/2017, 4:50 PM

## 2017-02-20 LAB — BASIC METABOLIC PANEL
ANION GAP: 10 (ref 5–15)
BUN: 16 mg/dL (ref 6–20)
CALCIUM: 8.6 mg/dL — AB (ref 8.9–10.3)
CO2: 20 mmol/L — ABNORMAL LOW (ref 22–32)
CREATININE: 1.08 mg/dL (ref 0.61–1.24)
Chloride: 112 mmol/L — ABNORMAL HIGH (ref 101–111)
GFR, EST NON AFRICAN AMERICAN: 59 mL/min — AB (ref >60)
Glucose, Bld: 108 mg/dL — ABNORMAL HIGH (ref 65–99)
Potassium: 3.4 mmol/L — ABNORMAL LOW (ref 3.5–5.1)
SODIUM: 142 mmol/L (ref 135–145)

## 2017-02-20 LAB — CBC
HEMATOCRIT: 35.3 % — AB (ref 39.0–52.0)
Hemoglobin: 11 g/dL — ABNORMAL LOW (ref 13.0–17.0)
MCH: 28 pg (ref 26.0–34.0)
MCHC: 31.2 g/dL (ref 30.0–36.0)
MCV: 89.8 fL (ref 78.0–100.0)
PLATELETS: 268 10*3/uL (ref 150–400)
RBC: 3.93 MIL/uL — ABNORMAL LOW (ref 4.22–5.81)
RDW: 16.8 % — AB (ref 11.5–15.5)
WBC: 5.6 10*3/uL (ref 4.0–10.5)

## 2017-02-20 MED ORDER — HYPROMELLOSE (GONIOSCOPIC) 2.5 % OP SOLN
1.0000 [drp] | Freq: Three times a day (TID) | OPHTHALMIC | Status: DC
  Filled 2017-02-20: qty 15

## 2017-02-20 MED ORDER — POTASSIUM CHLORIDE CRYS ER 20 MEQ PO TBCR
40.0000 meq | EXTENDED_RELEASE_TABLET | Freq: Once | ORAL | Status: AC
  Administered 2017-02-20: 40 meq via ORAL
  Filled 2017-02-20: qty 2

## 2017-02-20 MED ORDER — TRAZODONE HCL 50 MG PO TABS
50.0000 mg | ORAL_TABLET | Freq: Every evening | ORAL | Status: DC | PRN
  Administered 2017-02-20: 50 mg via ORAL
  Filled 2017-02-20: qty 1

## 2017-02-20 MED ORDER — POLYVINYL ALCOHOL 1.4 % OP SOLN: 1.0000 [drp] | OPHTHALMIC | 0 refills | Status: DC | PRN

## 2017-02-20 MED ORDER — POLYVINYL ALCOHOL 1.4 % OP SOLN: 1.0000 [drp] | OPHTHALMIC | Status: DC | PRN

## 2017-02-20 MED ORDER — TRAZODONE HCL 50 MG PO TABS: 50.0000 mg | ORAL_TABLET | Freq: Every day | ORAL | 0 refills | Status: DC

## 2017-02-20 MED ORDER — SENNOSIDES-DOCUSATE SODIUM 8.6-50 MG PO TABS: 1.0000 | ORAL_TABLET | Freq: Every evening | ORAL | Status: AC | PRN

## 2017-02-20 NOTE — Telephone Encounter (Signed)
Please inform family of decision by insurance. If they decide to transition to hospice they may have options on their formulary to help

## 2017-02-20 NOTE — Progress Notes (Signed)
Physical Therapy Treatment Patient Details Name: Thomas [name] MRN: [id number] DOB: 1926/06/13 Today's Date: 02/20/2017    History of Present Illness Pt is an 83 y/o male admitted secondary to slurred speech and facial droop. 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 PMH including but not limited to dementia, HTN and previous CVAs.    PT Comments    Patient seen for activity progression, ambulated in room, performed self care and functional tasks with assist. Patient remains limited overall by LE fatigue and decreased coordination. Would benefit from comprehensive therapies but per LCSW plan for SNF level rehabilitation upon discharge.   Follow Up Recommendations  SNF     Equipment Recommendations  None recommended by PT    Recommendations for Other Services Rehab consult     Precautions / Restrictions Precautions Precautions: Fall Restrictions Weight Bearing Restrictions: No    Mobility  Bed Mobility Overal bed mobility: Needs Assistance Bed Mobility: Sidelying to Sit   Sidelying to sit: Min guard Supine to sit: Min assist Sit to supine: Min guard   General bed mobility comments: increased time and effort, min guard for safety  Transfers Overall transfer level: Needs assistance Equipment used: Rolling walker (2 wheeled) Transfers: Sit to/from [facility] Sit to Stand: Min assist         General transfer comment: VCs for hand placement, Min assist to power to standing from both bed and toilet, cues for use of grab bar  Ambulation/Gait Ambulation/Gait assistance: Mod assist Ambulation Distance (Feet): 40 Feet Assistive device: Rolling walker (2 wheeled) Gait Pattern/deviations: Shuffle;Decreased step length - right;Decreased step length - left;Decreased stride length;Narrow base of support Gait velocity: Decreased   General  Gait Details: Patient easily fatigued this session, increased flexed posture requiring max cues for adjustment, moderate assist for stability   Stairs            Wheelchair Mobility    Modified Rankin (Stroke Patients Only) Modified Rankin (Stroke Patients Only) Pre-Morbid Rankin Score: Moderate disability Modified Rankin: Severe disability     Balance Overall balance assessment: Needs assistance Sitting-balance support: Feet supported Sitting balance-Leahy Scale: Fair Sitting balance - Comments: tolerated EOB as well as toilet without physical assist   Standing balance support: During functional activity;Single extremity supported Standing balance-Leahy Scale: Poor Standing balance comment: continues to show heavy reliance on UE support                             Cognition Arousal/Alertness: Awake/alert Behavior During Therapy: Flat affect Overall Cognitive Status: History of cognitive impairments - at baseline Area of Impairment: Following commands;Safety/judgement;Problem solving                 Orientation Level: Place;Time Current Attention Level: Selective Memory: Decreased short-term memory Following Commands: Follows one step commands with increased time Safety/Judgement: Decreased awareness of deficits;Decreased awareness of safety Awareness: Emergent Problem Solving: Slow processing;Difficulty sequencing;Requires verbal cues;Requires tactile cues General Comments: pt with hx of dementia at baseline      Exercises      General Comments General comments (skin integrity, edema, etc.): assisted patient with mobility to and from bathroom, patient able to perform some aspects of self care with min guard for safety, performed functional tasks at sink with min assist to maintain upright posture. increased assist for backward walking from counter with RW.  Pertinent Vitals/Pain Pain Assessment: No/denies pain    Home Living                       Prior Function            PT Goals (current goals can now be found in the care plan section) Acute Rehab PT Goals Patient Stated Goal: for pt to get stronger and more steady prior to returning home PT Goal Formulation: With family Time For Goal Achievement: 03/02/17 Potential to Achieve Goals: Fair Progress towards PT goals: Progressing toward goals    Frequency    Min 3X/week      PT Plan Current plan remains appropriate    Co-evaluation              AM-PAC PT "6 Clicks" Daily Activity  Outcome Measure  Difficulty turning over in bed (including adjusting bedclothes, sheets and blankets)?: Total Difficulty moving from lying on back to sitting on the side of the bed? : Total Difficulty sitting down on and standing up from a chair with arms (e.g., wheelchair, bedside commode, etc,.)?: Total Help needed moving to and from a bed to chair (including a wheelchair)?: A Lot Help needed walking in hospital room?: A Lot Help needed climbing 3-5 steps with a railing? : Total 6 Click Score: 8    End of Session Equipment Utilized During Treatment: Gait belt Activity Tolerance: Patient tolerated treatment well Patient left: in chair;with call bell/phone within reach;with chair alarm set Nurse Communication: Mobility status PT Visit Diagnosis: Unsteadiness on feet (R26.81);Other abnormalities of gait and mobility (R26.89);Other symptoms and signs involving the nervous system (R29.898)     Time: 5997-7414 PT Time Calculation (min) (ACUTE ONLY): 19 min  Charges:  $Therapeutic Activity: 8-22 mins                    G Codes:       Thomas [name], PT DPT [facility] [phone]    Thomas [name] 02/20/2017, 4:54 PM

## 2017-02-20 NOTE — Progress Notes (Signed)
  Speech Language Pathology Treatment: Dysphagia  Patient Details Name: Thomas [name] MRN: [id number] DOB: 1926-04-26 Today's Date: 02/20/2017 Time: 1152-1209 SLP Time Calculation (min) (ACUTE ONLY): 17 min  Assessment / Plan / Recommendation Clinical Impression  SLP followed up for dysphagia treatment and management with spouse and son at bedside. Pt seen with puree and  nectar thick lunch meal. Pt continues to exhibit a moderate oropharyngeal sensory motor dysphagia of neurogenic etiology. Pt with right sided anterior spillage, that was less when bolus was directed to left side of oral cavity. Educated family regarding safe swallow strategies. Cup sips of nectar thick liquids exhibited poor labial seal and poor oral control.  Ice chips administered following oral care with overt coughing displayed concerning for compromised airway protection. Recommend nectar thick liquids by teaspoon and puree consistencies with continued ST intervention at next level of care. Aspiration risk remains increased following multiple CVAs, cognitive deficits, and immobility.    HPI HPI: Pt is an 83 y.o. male with PMH: dementia, HTN, multiple prior CVA's, mesothelioma, who presented to ED with right sided facial droop and slurred speech at his SNF on 7/5. Upon hospital admission, daughter reported that patient drinks nectar thick liquids and regular solids at home. He has had two recent MBS during previous hospitalizations, on 5/25 and 6/8, both of which recommended nectar thick liquids and mechanical soft solids due to aspiration of thin liquids and oral and swallow initiation delays.       SLP Plan  Continue with current plan of care       Recommendations  Diet recommendations: Nectar-thick liquid;Dysphagia 1 (puree) Liquids provided via: Teaspoon Medication Administration: Crushed with puree Supervision: Staff to assist with self feeding;Full supervision/cueing for compensatory strategies Compensations:  Minimize environmental distractions;Slow rate;Small sips/bites;Follow solids with liquid;Monitor for anterior loss;Multiple dry swallows after each bite/sip;Lingual sweep for clearance of pocketing Postural Changes and/or Swallow Maneuvers: Seated upright 90 degrees;Upright 30-60 min after meal                Oral Care Recommendations: Oral care BID Follow up Recommendations: Skilled Nursing facility SLP Visit Diagnosis: Dysphagia, oropharyngeal phase (R13.12) Plan: Continue with current plan of care       [place] MA, [place] [facility] Speech Language Pathologist    Levi Aland 02/20/2017, 12:17 PM

## 2017-02-20 NOTE — Progress Notes (Signed)
Inpatient Rehabilitation  Followed up with patient's daughter Freda Munro via phone to discuss IP Rehab MD recommendations.  Freda Munro acknowledged that her father would have a hard time tolerating 3 hours of therapy a day.  She also acknowledged that they do not have 24/7 care available at the level that he requires.  She verbalized understanding of recommendations and stated that she will be pursuing SNF for post acute rehab.  Will sign off at this time.  Please call with questions.   Carmelia Roller., CCC/SLP Admission Coordinator  [place]  Cell [phone]

## 2017-02-20 NOTE — NC FL2 (Signed)
[place] LEVEL OF CARE SCREENING TOOL     IDENTIFICATION  Patient Name: Thomas [name] Birthdate: 08/26/1925 Sex: male Admission Date (Current Location): 02/15/2017  [facility] and [place] Number:  Herbalist and Address:  [facility], [place] [address] [place], [place] [address]      Provider Number: [id number]  Attending Physician Name and Address:  Theodis Blaze, MD  Relative Name and Phone Number:       Current Level of Care: Hospital Recommended Level of Care: [place] Prior Approval Number:    Date Approved/Denied:   PASRR Number: [id number]  Discharge Plan: SNF    Current Diagnoses: Patient Active Problem List   Diagnosis Date Noted  . Acute ischemic stroke ([place])   . Esophageal stricture   . Acute lower UTI   . Dementia without behavioral disturbance   . Diabetes mellitus type 2 in nonobese (HCC)   . Basilar artery occlusion   . Acute ischemic multifocal posterior circulation stroke ([place]) 02/15/2017  . History of CVA (cerebrovascular accident) 02/15/2017  . DNR (do not resuscitate) 02/15/2017  . Facial droop 02/15/2017  . Slurred speech 02/15/2017  . Memory loss due to medical condition 02/12/2017  . Acute blood loss anemia   . Palliative care by specialist   . Vertigo following cerebrovascular accident 12/16/2016  . Type 2 diabetes mellitus with hyperlipidemia ([place]) 12/16/2016  . Insomnia 12/07/2016  . Alcohol abuse 08/04/2016  . Permanent atrial fibrillation ([place]) 07/21/2016  . Malignant mesothelioma of pleura ([place]) 02/16/2016  . Perforation of right tympanic membrane 11/24/2013  . Carotid stenosis 02/21/2013  . Intracranial vascular stenosis 02/17/2013  . GERD (gastroesophageal reflux disease) 05/17/2011  . Dysphagia 05/17/2011  . Stricture and stenosis of esophagus 08/29/2010  . Allergic rhinitis 01/16/2008  . 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 PROSTATE CANCER, HX OF 03/26/2007   . Hyperlipidemia 03/20/2007  . Essential hypertension 03/20/2007    Orientation RESPIRATION BLADDER Height & Weight     Self, Place  Normal Incontinent Weight: 163 lb 3.2 oz (74 kg) Height:  5\' 11"  (180.3 cm)  BEHAVIORAL SYMPTOMS/MOOD NEUROLOGICAL BOWEL NUTRITION STATUS      Incontinent Diet (see DC summary)  AMBULATORY STATUS COMMUNICATION OF NEEDS Skin   Limited Assist Verbally Normal                       Personal Care Assistance Level of Assistance  Bathing, Dressing Bathing Assistance: Limited assistance   Dressing Assistance: Limited assistance     Functional Limitations Info             SPECIAL CARE FACTORS FREQUENCY  PT (By licensed PT), OT (By licensed OT), Speech Therapy     PT Frequency: 5/wk OT Frequency: 5/wk  ST Frequency: 2/wk            Contractures      Additional Factors Info  Psychotropic, Code Status, Allergies Code Status Info: DNR Allergies Info: Penicillins, Ace Inhibitors Psychotropic Info: seroquel         Current Medications (02/20/2017):  This is the current hospital active medication list Current Facility-Administered Medications  Medication Dose Route Frequency Provider Last Rate Last Dose  . acetaminophen (TYLENOL) tablet 650 mg  650 mg Oral Q4H PRN Roney Jaffe, MD   650 mg at 02/17/17 1755   Or  . acetaminophen (TYLENOL) solution 650 mg  650 mg Per Tube Q4H PRN Roney Jaffe, MD   [phone]  mg at 02/17/17 0204   Or  . acetaminophen (TYLENOL) suppository 650 mg  650 mg Rectal Q4H PRN Roney Jaffe, MD      . amLODipine (NORVASC) tablet 5 mg  5 mg Oral Daily Roney Jaffe, MD   5 mg at 02/19/17 1120  . apixaban (ELIQUIS) tablet 5 mg  5 mg Oral BID Roney Jaffe, MD   5 mg at 02/19/17 2235  . aspirin chewable tablet 81 mg  81 mg Oral Daily Jaquita Folds, RPH   81 mg at 02/19/17 1120  . cloNIDine (CATAPRES) tablet 0.1 mg  0.1 mg Oral BID Roney Jaffe, MD   0.1 mg at 02/19/17 2235  . polyvinyl alcohol (LIQUIFILM  TEARS) 1.4 % ophthalmic solution 1 drop  1 drop Both Eyes PRN Theodis Blaze, MD      . QUEtiapine (SEROQUEL) tablet 25 mg  25 mg Oral QHS Roney Jaffe, MD   25 mg at 02/19/17 2235  . RESOURCE THICKENUP CLEAR   Oral PRN Theodis Blaze, MD      . rosuvastatin (CRESTOR) tablet 20 mg  20 mg Oral Daily Roney Jaffe, MD   20 mg at 02/19/17 1120  . senna-docusate (Senokot-S) tablet 1 tablet  1 tablet Oral QHS PRN Roney Jaffe, MD         Discharge Medications: Please see discharge summary for a list of discharge medications.  Relevant Imaging Results:  Relevant Lab Results:   Additional Information SS#: [id number]  Jorge Ny, LCSW

## 2017-02-20 NOTE — Discharge Summary (Signed)
Physician Discharge Summary  Thomas [name] UEA:[id number] DOB: Dec 24, 1925 DOA: 02/15/2017  PCP: Marin Olp, MD  Admit date: 02/15/2017 Discharge date: 02/20/2017  Recommendations for Outpatient Follow-up:  1. Pt will need to follow up with PCP in 1-2 weeks post discharge 2. Please obtain BMP to evaluate electrolytes and kidney function, K level  3. Please also check CBC to evaluate Hg and Hct levels  Discharge Diagnoses:  Principal Problem:   Acute ischemic multifocal posterior circulation stroke ([place]) Active Problems:   Essential hypertension   PROSTATE CANCER, HX OF  Discharge Condition: Stable  Diet recommendation: Dys I diet   Brief Narrative:  Pt is 83 yo male with known dementia, HTN, prior CVA's 5-6 episodes (on Eliquis), hx of prostate cancer treated with seed implants, recent UTI treated with ABX, presented with main concern of slurred speech, right facial droop that was noted several hours prior to admission. .  Assessment & Plan:   Stroke, posterior circulation, right cerebellum, multifocal areas in brain stem  Resultant slurred speech, right facial droop  MRIheadcontinued progression of severe vertebrobasilar system stenosis, loss of basial artery flow, lack of bilateral PCA flow, acute on subacute post circulation infarcts, multifocal new involvement of the brainstem  2D Echorecent one done 12/2016 with stable EF 50-55%, sclerotic aortic valve   LDL91  PT eval done, Rehab MD says SNF more appropriate   Family to discuss today and plan discharge in AM   Hypertension, accelerated - Permissive HTN and gradually normalize  - SBP down from 200's --> 180's --> 170's --> 140 - 150's this AM, this is reasonable  - continue Norvasc and Clonidine   UTI, enterobacter - patient has been on Levaquin for 3 days but due to prolonged QRS Levaquin was discontinued and patient was started on Bactrim - We will stop Bactrim today - Patient has no urinary  concerns  Hyperlipidemia - LDL 91  Hypokalemia - continue to supplement, repeat BMP in the morning prior to discharge  DVT prophylaxis: Eliquis  Code Status: DNR Family Communication: multiple family members at bedside updated Disposition Plan: plan to discharge to skilled nursing facility in the morning if blood work stable  Consultants:   Neurology  PCT  PT/OT/SLP  Procedures:   None  Antimicrobials:   Levaquin 7/6 --> 7/9  Bactrim 7/9 --> 7/10  Procedures/Studies: Dg Chest 2 View  Result Date: 02/16/2017 CLINICAL DATA:  CVA EXAM: CHEST  2 VIEW COMPARISON:  01/14/2017 FINDINGS: Complete opacification of the left hemithorax again noted, stable. Right lung is clear. Heart is mildly enlarged. No change since prior study. IMPRESSION: Stable complete opacification of the left hemithorax, stable. Electronically Signed   By: Rolm Baptise M.D.   On: 02/16/2017 11:03   Mr Brain Wo Contrast  Addendum Date: 02/16/2017   ADDENDUM REPORT: 02/16/2017 12:05 ADDENDUM: Study discussed by telephone with Dr. Doyle Askew On 02/16/2017 at 12:03 . Electronically Signed   By: Genevie Ann M.D.   On: 02/16/2017 12:05   Result Date: 02/16/2017 CLINICAL DATA:  83 year old male with new onset slurred speech and facial droop since yesterday. Right side posterior circulation infarcts in May and June. EXAM: MRI HEAD WITHOUT CONTRAST MRA HEAD WITHOUT CONTRAST TECHNIQUE: Multiplanar, multiecho pulse sequences of the brain and surrounding structures were obtained without intravenous contrast. Angiographic images of the head were obtained using MRA technique without contrast. COMPARISON:  Brain MRI 01/22/2017 and earlier. CTA head and neck 01/05/2017. Intracranial MRA 12/14/2016. FINDINGS: MRI HEAD FINDINGS Brain: New restricted  diffusion in the right lateral pons and left paracentral pons. New small foci of cortical restricted diffusion in the inferior left occipital lobe, and at the right temporal lobe tip. New  patchy areas of restricted diffusion in the right cerebellar hemisphere, including the right cerebellar peduncle, corresponding to areas which were relatively spared by the large right inferior cerebellar infarct on 01/06/2017. There is also punctate restricted diffusion in the left thalamus. Continued restricted diffusion in the dorsal right medulla, and right ventral midbrain. No anterior circulation restricted diffusion identified. Associated T2 and FLAIR hyperintensity in the acutely affected areas with no associated hemorrhage or mass effect. Developing encephalomalacia in the right cerebellar hemisphere and both occipital poles. Patchy and confluent bilateral cerebral white matter T2 and FLAIR hyperintensity elsewhere is stable. A small area of cortical encephalomalacia in the superior left parietal lobe is stable. No midline shift, mass effect, evidence of mass lesion, ventriculomegaly, extra-axial collection or acute intracranial hemorrhage. Cervicomedullary junction and pituitary are within normal limits. Vascular: Worsening basilar artery flow void since 01/22/2017. Continued abnormal distal left V4 segment flow void. Other Major intracranial vascular flow voids are stable. Skull and upper cervical spine: Negative. Visualized bone marrow signal is within normal limits. Sinuses/Orbits: Stable an negative. Other: Stable mild mastoid effusions.  Negative scalp soft tissues. MRA HEAD FINDINGS Little antegrade flow in the distal vertebral arteries and basilar artery, appearing similar to but worse than the CTA head and neck on 01/05/2017, and severely progressed loss of basilar artery flow since the MRA on 12/14/2016. Preserved left PICA flow signal. No significant flow in the bilateral PCAs. Antegrade flow in both ICA siphons appears stable since 12/14/2016, although there is mild to moderate left and moderate to severe right ICA siphon stenosis. Both carotid termini remain patent. MCA and ACA origins remain  patent. Visible bilateral MCA and ACA branches are stable since 12/14/2016. IMPRESSION: 1. Continued progression of severe vertebrobasilar system stenosis and loss of basilar artery flow since the Head and neck CTA on 12/31/2016. Lack of bilateral PCA flow signal today. 2. Acute on subacute posterior circulation infarcts with fairly extensive new involvement of the right cerebellum, and multifocal new involvement of the brainstem. 3. No associated hemorrhage or mass effect. 4. The anterior circulation is stable the 12/14/2016 MRA, with up to moderate left and severe right ICA siphon stenosis. Electronically Signed: By: Genevie Ann M.D. On: 02/16/2017 11:43   Mr Brain Wo Contrast  Result Date: 01/22/2017 CLINICAL DATA:  Altered mental status. Weakness. Dysarthria. Right mid brain infarction and right thalamic infarction. EXAM: MRI HEAD WITHOUT CONTRAST TECHNIQUE: Multiplanar, multiecho pulse sequences of the brain and surrounding structures were obtained without intravenous contrast. COMPARISON:  01/18/2017.  01/06/2017. FINDINGS: Brain: Diffusion imaging does not show any newly seen insult since the study of 4 days ago. Expected evolutionary changes of recent right cerebellar, right brainstem, right midbrain, right thalamus and right occipital infarctions. Older infarctions within both occipital lobes appear the same. Chronic small-vessel ischemic changes throughout the deep and subcortical white matter appear the same. No evidence of hemorrhage, mass lesion, hydrocephalus or extra-axial collection. Vascular: Major vessels at the base of the brain show flow. Skull and upper cervical spine: Negative Sinuses/Orbits: Clear/ normal. Small amount of fluid in the mastoid air cells. Other: None significant IMPRESSION: No new insult since the previous study. Expected evolutionary changes continue in the region of infarction in the right cerebellum, right brainstem, right midbrain, right thalamus and right occipital lobe.  Older ischemic changes elsewhere  appear the same. Electronically Signed   By: Nelson Chimes M.D.   On: 01/22/2017 09:33   Dg Swallowing Func-speech Pathology  Result Date: 02/17/2017 Objective Swallowing Evaluation: Type of Study: MBS-Modified Barium Swallow Study Patient Details Name: Merlin Golden MRN: [id number] Date of Birth: 02-21-1926 Today's Date: 02/17/2017 Time: SLP Start Time (ACUTE ONLY): 1045-SLP Stop Time (ACUTE ONLY): 1125 SLP Time Calculation (min) (ACUTE ONLY): 40 min Past Medical History: Past Medical History: Diagnosis Date . Adenomatous colon polyp 2009 . Allergy  . Carotid artery occlusion  . Diverticulosis of colon (without mention of hemorrhage)  . ED (erectile dysfunction)  . Esophageal stricture  . Esophagitis  . GERD (gastroesophageal reflux disease)  . Goals of care, counseling/discussion 12/23/2016 . Hiatal hernia  . Hypertension  . Prostate cancer ([place])  . Unspecified gastritis and gastroduodenitis without mention of hemorrhage  Past Surgical History: Past Surgical History: Procedure Laterality Date . ENDARTERECTOMY Left 03/05/2013  Procedure: ENDARTERECTOMY CAROTID;  Surgeon: Conrad [place], MD;  Location: [facility];  Service: Vascular;  Laterality: Left; . ESOPHAGOGASTRODUODENOSCOPY (EGD) WITH ESOPHAGEAL DILATION   . PROSTATE SURGERY    Laser surgery HPI: Pt is an 83 y.o. male with PMH: dementia, HTN, multiple prior CVA's, mesothelioma, who presented to ED with right sided facial droop and slurred speech at his SNF on 7/5. Upon hospital admission, daughter reported that patient drinks nectar thick liquids and regular solids at home. He has had two recent MBS during previous hospitalizations, on 5/25 and 6/8, both of which recommended nectar thick liquids and mechanical soft solids due to aspiration of thin liquids and oral and swallow initiation delays.  Subjective: patient alert but appeared fatigued Assessment / Plan / Recommendation CHL IP CLINICAL IMPRESSIONS 02/17/2017 Clinical Impression  Patient presents with a severe oral and mod-severe sensorimotor pharyngeal dysphagia with some impact from patient's current level of alertness(appears fatigued/tired). Patient's oral dysphagia is characterized by lingual manipulation and transit delays, and decreased ability to masticate soft solid textures. Pharyngeal phase is characterized by swallow initiation delay to vallecular sinus with puree solids, nectar thick liquids, honey thick liquids and mechanical soft solids, and delays to pyriform sinus with thin liquids. Patient did require cues for first and at times second swallow, however he was able to fully clear pharyngeal residuals. Despite significant delays of swallow initiation, he demonstrated good airway protection with honey thick, puree, nectar thick and soft solid textures. He exhibited one instance of flash penetration with nectar thick liquids via cup sips and exhibited consistent flash penetration with thin liquids via spoon and via cup sips. As compared to recent two MBS's, patient's oral dysphagia is worse on today's exam, but he did not exhibit the level of penetration and aspiration with thins as on the previous tests. SLP Visit Diagnosis Dysphagia, oropharyngeal phase (R13.12) Attention and concentration deficit following -- Frontal lobe and executive function deficit following -- Impact on safety and function Moderate aspiration risk   CHL IP TREATMENT RECOMMENDATION 02/17/2017 Treatment Recommendations Therapy as outlined in treatment plan below   Prognosis 02/17/2017 Prognosis for Safe Diet Advancement Good Barriers to Reach Goals Cognitive deficits Barriers/Prognosis Comment -- CHL IP DIET RECOMMENDATION 02/17/2017 SLP Diet Recommendations Dysphagia 1 (Puree) solids;Nectar thick liquid Liquid Administration via Spoon Medication Administration Crushed with puree Compensations Minimize environmental distractions;Slow rate;Small sips/bites Postural Changes Seated upright at 90 degrees;Remain  semi-upright after after feeds/meals (Comment)   CHL IP OTHER RECOMMENDATIONS 02/17/2017 Recommended Consults -- Oral Care Recommendations Oral care BID Other Recommendations Order thickener from  pharmacy;Prohibited food (jello, ice cream, thin soups);Remove water pitcher   CHL IP FOLLOW UP RECOMMENDATIONS 02/17/2017 Follow up Recommendations Skilled Nursing facility   [facility] IP FREQUENCY AND DURATION 02/17/2017 Speech Therapy Frequency (ACUTE ONLY) min 2x/week Treatment Duration 2 weeks      CHL IP ORAL PHASE 02/17/2017 Oral Phase Impaired Oral - Pudding Teaspoon -- Oral - Pudding Cup -- Oral - Honey Teaspoon Weak lingual manipulation;Lingual/palatal residue;Delayed oral transit Oral - Honey Cup Weak lingual manipulation;Delayed oral transit;Lingual/palatal residue Oral - Nectar Teaspoon Weak lingual manipulation;Delayed oral transit Oral - Nectar Cup Delayed oral transit;Weak lingual manipulation Oral - Nectar Straw NT Oral - Thin Teaspoon Right anterior bolus loss;Weak lingual manipulation;Delayed oral transit Oral - Thin Cup Delayed oral transit;Weak lingual manipulation Oral - Thin Straw NT Oral - Puree Weak lingual manipulation;Right anterior bolus loss;Delayed oral transit;Lingual/palatal residue Oral - Mech Soft Weak lingual manipulation;Impaired mastication;Delayed oral transit;Lingual/palatal residue Oral - Regular NT Oral - Multi-Consistency NT Oral - Pill NT Oral Phase - Comment --  CHL IP PHARYNGEAL PHASE 02/17/2017 Pharyngeal Phase Impaired Pharyngeal- Pudding Teaspoon -- Pharyngeal -- Pharyngeal- Pudding Cup -- Pharyngeal -- Pharyngeal- Honey Teaspoon Delayed swallow initiation-vallecula Pharyngeal Material does not enter airway Pharyngeal- Honey Cup Delayed swallow initiation-vallecula;Pharyngeal residue - valleculae Pharyngeal Material does not enter airway Pharyngeal- Nectar Teaspoon Delayed swallow initiation-vallecula Pharyngeal Material does not enter airway Pharyngeal- Nectar Cup Delayed swallow  initiation-vallecula;Penetration/Aspiration during swallow;Pharyngeal residue - valleculae Pharyngeal Material enters airway, remains ABOVE vocal cords then ejected out Pharyngeal- Nectar Straw NT Pharyngeal -- Pharyngeal- Thin Teaspoon Penetration/Aspiration during swallow;Delayed swallow initiation-pyriform sinuses;Reduced tongue base retraction;Reduced airway/laryngeal closure Pharyngeal Material enters airway, remains ABOVE vocal cords then ejected out Pharyngeal- Thin Cup Delayed swallow initiation-pyriform sinuses;Reduced airway/laryngeal closure;Reduced tongue base retraction;Penetration/Aspiration during swallow Pharyngeal Material enters airway, remains ABOVE vocal cords then ejected out Pharyngeal- Thin Straw NT Pharyngeal -- Pharyngeal- Puree Delayed swallow initiation-vallecula;Pharyngeal residue - valleculae Pharyngeal -- Pharyngeal- Mechanical Soft Delayed swallow initiation-vallecula;Pharyngeal residue - valleculae Pharyngeal -- Pharyngeal- Regular NT Pharyngeal -- Pharyngeal- Multi-consistency NT Pharyngeal -- Pharyngeal- Pill -- Pharyngeal -- Pharyngeal Comment --  CHL IP CERVICAL ESOPHAGEAL PHASE 02/17/2017 Cervical Esophageal Phase WFL Pudding Teaspoon -- Pudding Cup -- Honey Teaspoon -- Honey Cup -- Nectar Teaspoon -- Nectar Cup -- Nectar Straw -- Thin Teaspoon -- Thin Cup -- Thin Straw -- Puree -- Mechanical Soft -- Regular -- Multi-consistency -- Pill -- Cervical Esophageal Comment -- CHL IP GO 12/14/2016 Functional Assessment Tool Used clinical judgment Functional Limitations Memory Swallow Current Status (P1025) (None) Swallow Goal Status (E5277) (None) Swallow Discharge Status (O2423) (None) Motor Speech Current Status (N3614) (None) Motor Speech Goal Status (E3154) (None) Motor Speech Goal Status (M0867) (None) Spoken Language Comprehension Current Status (Y1950) (None) Spoken Language Comprehension Goal Status (D3267) (None) Spoken Language Comprehension Discharge Status [phone]) (None) Spoken  Language Expression Current Status (K9983) (None) Spoken Language Expression Goal Status (J8250) (None) Spoken Language Expression Discharge Status [phone] (None) Attention Current Status (B3419) (None) Attention Goal Status (F7902) (None) Attention Discharge Status (I0973) (None) Memory Current Status (Z3299) CK Memory Goal Status (M4268) CK Memory Discharge Status (G9170) CK Voice Current Status (G9171) (None) Voice Goal Status (T4196) (None) Voice Discharge Status (Q2297) (None) Other Speech-Language Pathology Functional Limitation Current Status (L8921) (None) Other Speech-Language Pathology Functional Limitation Goal Status (J9417) (None) Other Speech-Language Pathology Functional Limitation Discharge Status [phone]) (None) Sonia Baller, MA, CCC-SLP 02/17/17 4:45 PM              Mr Jodene Nam Head/brain Wo  Cm  Addendum Date: 02/16/2017   ADDENDUM REPORT: 02/16/2017 12:05 ADDENDUM: Study discussed by telephone with Dr. Doyle Askew On 02/16/2017 at 12:03 . Electronically Signed   By: Genevie Ann M.D.   On: 02/16/2017 12:05   Result Date: 02/16/2017 CLINICAL DATA:  83 year old male with new onset slurred speech and facial droop since yesterday. Right side posterior circulation infarcts in May and June. EXAM: MRI HEAD WITHOUT CONTRAST MRA HEAD WITHOUT CONTRAST TECHNIQUE: Multiplanar, multiecho pulse sequences of the brain and surrounding structures were obtained without intravenous contrast. Angiographic images of the head were obtained using MRA technique without contrast. COMPARISON:  Brain MRI 01/22/2017 and earlier. CTA head and neck 01/05/2017. Intracranial MRA 12/14/2016. FINDINGS: MRI HEAD FINDINGS Brain: New restricted diffusion in the right lateral pons and left paracentral pons. New small foci of cortical restricted diffusion in the inferior left occipital lobe, and at the right temporal lobe tip. New patchy areas of restricted diffusion in the right cerebellar hemisphere, including the right cerebellar peduncle,  corresponding to areas which were relatively spared by the large right inferior cerebellar infarct on 01/06/2017. There is also punctate restricted diffusion in the left thalamus. Continued restricted diffusion in the dorsal right medulla, and right ventral midbrain. No anterior circulation restricted diffusion identified. Associated T2 and FLAIR hyperintensity in the acutely affected areas with no associated hemorrhage or mass effect. Developing encephalomalacia in the right cerebellar hemisphere and both occipital poles. Patchy and confluent bilateral cerebral white matter T2 and FLAIR hyperintensity elsewhere is stable. A small area of cortical encephalomalacia in the superior left parietal lobe is stable. No midline shift, mass effect, evidence of mass lesion, ventriculomegaly, extra-axial collection or acute intracranial hemorrhage. Cervicomedullary junction and pituitary are within normal limits. Vascular: Worsening basilar artery flow void since 01/22/2017. Continued abnormal distal left V4 segment flow void. Other Major intracranial vascular flow voids are stable. Skull and upper cervical spine: Negative. Visualized bone marrow signal is within normal limits. Sinuses/Orbits: Stable an negative. Other: Stable mild mastoid effusions.  Negative scalp soft tissues. MRA HEAD FINDINGS Little antegrade flow in the distal vertebral arteries and basilar artery, appearing similar to but worse than the CTA head and neck on 01/05/2017, and severely progressed loss of basilar artery flow since the MRA on 12/14/2016. Preserved left PICA flow signal. No significant flow in the bilateral PCAs. Antegrade flow in both ICA siphons appears stable since 12/14/2016, although there is mild to moderate left and moderate to severe right ICA siphon stenosis. Both carotid termini remain patent. MCA and ACA origins remain patent. Visible bilateral MCA and ACA branches are stable since 12/14/2016. IMPRESSION: 1. Continued progression of  severe vertebrobasilar system stenosis and loss of basilar artery flow since the Head and neck CTA on 12/31/2016. Lack of bilateral PCA flow signal today. 2. Acute on subacute posterior circulation infarcts with fairly extensive new involvement of the right cerebellum, and multifocal new involvement of the brainstem. 3. No associated hemorrhage or mass effect. 4. The anterior circulation is stable the 12/14/2016 MRA, with up to moderate left and severe right ICA siphon stenosis. Electronically Signed: By: Genevie Ann M.D. On: 02/16/2017 11:43   Ct Head Code Stroke W/o Cm  Result Date: 02/15/2017 CLINICAL DATA:  Code stroke. Slurred speech, RIGHT facial droop. Last seen normal at 1800 hours. History of hypertension, prostate cancer, diabetes, stroke. EXAM: CT HEAD WITHOUT CONTRAST TECHNIQUE: Contiguous axial images were obtained from the base of the skull through the vertex without intravenous contrast. COMPARISON:  MRI of  the head January 22, 2017 and CT HEAD January 18, 2017. FINDINGS: BRAIN: No intraparenchymal hemorrhage, mass effect nor midline shift. Mesial LEFT parietoccipital and RIGHT occipital lobe encephalomalacia. Old RIGHT cerebellar infarcts. Old RIGHT basal ganglia lacunar infarct versus lacunar infarct. Old small LEFT parietal vertex infarct. Confluent supratentorial white matter hypodensities. No midline shift, mass effect or acute large vascular territory infarct. No abnormal extra-axial fluid collections. Basal cisterns are patent. VASCULAR: Moderate to severe calcific atherosclerosis of the carotid siphons. SKULL: No skull fracture. No significant scalp soft tissue swelling. SINUSES/ORBITS: The mastoid air-cells and included paranasal sinuses are well-aerated.The included ocular globes and orbital contents are non-suspicious. OTHER: None. ASPECTS [facility] Stroke Program Early CT Score) - Ganglionic level infarction (caudate, lentiform nuclei, internal capsule, insula, M1-M3 cortex): 7 - Supraganglionic  infarction (M4-M6 cortex): 3 Total score (0-10 with 10 being normal): 10 IMPRESSION: 1. No acute intracranial process. 2. Old bilateral PCA territory and RIGHT posterior-inferior cerebellar artery territory infarcts. Old small LEFT parietal lobe infarct. Old basal ganglia lacunar infarcts. 3. Severe chronic small vessel ischemic disease. 4. ASPECTS is 10. Critical Value/emergent results were called by telephone at the time of interpretation on 02/15/2017 at 7:30 pm to Dr. Paschal Dopp, Neurology, who verbally acknowledged these results. Electronically Signed   By: Elon Alas M.D.   On: 02/15/2017 19:34     Discharge Exam: Vitals:   02/20/17 0600 02/20/17 0930  BP: (!) 196/98 (!) 153/89  Pulse: 80 79  Resp: 18 18  Temp: 98.1 F (36.7 C) 98 F (36.7 C)   Vitals:   02/19/17 2206 02/20/17 0100 02/20/17 0600 02/20/17 0930  BP: (!) 185/104 (!) 152/88 (!) 196/98 (!) 153/89  Pulse: 77 67 80 79  Resp: 18 16 18 18   Temp: 98 F (36.7 C) 97.7 F (36.5 C) 98.1 F (36.7 C) 98 F (36.7 C)  TempSrc: Oral Axillary Oral Oral  SpO2: 99% 100% 100% 100%  Weight:      Height:        General: Pt is alert, follows commands appropriately, not in acute distress Cardiovascular: Regular rate and rhythm, S1/S2 +, no rubs, no gallops Respiratory: Clear to auscultation bilaterally, no wheezing, no crackles, no rhonchi Abdominal: Soft, non tender, non distended, bowel sounds +, no guarding Extremities: no edema, no cyanosis, pulses palpable bilaterally DP and PT Neuro: still with right facial droop and aphasia but overall better   Discharge Instructions   Allergies as of 02/20/2017      Reactions   Penicillins Anaphylaxis, Swelling   THROAT SWELLS Has patient had a PCN reaction causing immediate rash, facial/tongue/throat swelling, SOB or lightheadedness with hypotension: Yes Has patient had a PCN reaction causing severe rash involving mucus membranes or skin necrosis: No Has patient had a PCN reaction  that required hospitalization: No Has patient had a PCN reaction occurring within the last 10 years: No If all of the above answers are "NO", then may proceed with Cephalosporin use.   Ace Inhibitors Hives      Medication List    STOP taking these medications   cephALEXin 500 MG capsule Commonly known as:  KEFLEX   LORazepam 0.5 MG tablet Commonly known as:  ATIVAN   metoCLOPramide 5 MG tablet Commonly known as:  REGLAN     TAKE these medications   acetaminophen 325 MG tablet Commonly known as:  TYLENOL Take 650 mg by mouth every 6 (six) hours as needed for mild pain, fever or headache.   amLODipine 5 MG tablet  Commonly known as:  NORVASC Take 1 tablet (5 mg total) by mouth daily.   aspirin 81 MG EC tablet Take 1 tablet (81 mg total) by mouth daily. What changed:  when to take this  reasons to take this   cloNIDine 0.1 MG tablet Commonly known as:  CATAPRES Take 1 tablet (0.1 mg total) by mouth 2 (two) times daily.   ELIQUIS 5 MG Tabs tablet Generic drug:  apixaban Take 5 mg by mouth 2 (two) times daily.   haloperidol 1 MG tablet Commonly known as:  HALDOL Take 1 mg by mouth once as needed for agitation.   polyvinyl alcohol 1.4 % ophthalmic solution Commonly known as:  LIQUIFILM TEARS Place 1 drop into both eyes as needed for dry eyes.   QUEtiapine 25 MG tablet Commonly known as:  SEROQUEL Take 1 tablet (25 mg total) by mouth at bedtime.   rosuvastatin 20 MG tablet Commonly known as:  CRESTOR Take 1 tablet (20 mg total) by mouth daily.   senna-docusate 8.6-50 MG tablet Commonly known as:  Senokot-S Take 1 tablet by mouth at bedtime as needed for mild constipation.   traZODone 50 MG tablet Commonly known as:  DESYREL Take 1 tablet (50 mg total) by mouth at bedtime.      Follow-up Information    Penni Bombard, MD Follow up on 03/09/2017.   Specialties:  Neurology, Radiology Contact information: [address] [place]  [address] [phone]        Marin Olp, MD Follow up.   Specialty:  Family Medicine Contact information: [address] [place] [address] [phone]            The results of significant diagnostics from this hospitalization (including imaging, microbiology, ancillary and laboratory) are listed below for reference.     Microbiology: Recent Results (from the past 240 hour(s))  Urine Culture     Status: None   Collection Time: 02/12/17  4:04 PM  Result Value Ref Range Status   Culture ENTEROBACTER CLOACAE  Final   Colony Count Greater than 100,000 CFU/mL  Final   Organism ID, Bacteria ENTEROBACTER CLOACAE  Final      Susceptibility   Enterobacter cloacae -  (no method available)    AMOX/CLAVULANIC >=32 Resistant     PIP/TAZO 8 Sensitive     IMIPENEM 0.5 Sensitive     CEFAZOLIN >=64 Resistant     CEFTRIAXONE <=1 Sensitive     CEFTAZIDIME <=1 Sensitive     CEFEPIME <=1 Sensitive     GENTAMICIN <=1 Sensitive     TOBRAMYCIN <=1 Sensitive     CIPROFLOXACIN <=0.25 Sensitive     LEVOFLOXACIN <=0.12 Sensitive     NITROFURANTOIN 64 Intermediate     TRIMETH/SULFA* <=20 Sensitive      * NR=NOT REPORTABLE,SEE COMMENTORAL therapy:A cefazolin MIC of <32 predicts susceptibility to the oral agents cefaclor,cefdinir,cefpodoxime,cefprozil,cefuroxime,cephalexin,and loracarbef when used for therapy of uncomplicated UTIs due to E.coli,K.pneumomiae,and P.mirabilis. PARENTERAL therapy: A cefazolinMIC of >8 indicates resistance to parenteralcefazolin. An alternate test method must beperformed to confirm susceptibility to parenteralcefazolin.     Labs: Basic Metabolic Panel:  Recent Labs Lab 02/15/17 1911 02/15/17 1917 02/17/17 1213 02/18/17 0352 02/19/17 0140 02/20/17 0305  NA 136 142 136 137 141 142  K 3.0* 3.1* 3.0* 2.8* 3.2* 3.4*  CL 105 106 109 108 112* 112*  CO2 16*  --  15* 17* 21* 20*  GLUCOSE 153* 153* 136* 118* 133* 108*  BUN 17  20 14 11 10 16    CREATININE 1.31* 1.10 1.04 1.13 1.09 1.08  CALCIUM 9.1  --  8.6* 8.6* 8.4* 8.6*  MG  --   --   --  2.0 2.0  --    Liver Function Tests:  Recent Labs Lab 02/15/17 1911  AST 22  ALT 14*  ALKPHOS 80  BILITOT 0.8  PROT 7.8  ALBUMIN 3.4*   CBC:  Recent Labs Lab 02/15/17 1911 02/15/17 1917 02/17/17 1213 02/18/17 0352 02/19/17 0140 02/20/17 0305  WBC 7.6  --  9.2 7.8 6.7 5.6  NEUTROABS 5.2  --   --   --   --   --   HGB 12.7* 13.3 12.2* 12.4* 11.0* 11.0*  HCT 39.8 39.0 37.1* 38.0* 34.4* 35.3*  MCV 88.8  --  88.3 87.6 88.2 89.8  PLT 267  --  268 287 272 268   CBG:  Recent Labs Lab 02/15/17 1911  GLUCAP 150*   SIGNED: Time coordinating discharge: 65 minutes  Faye Ramsay, MD  [facility] 02/20/2017, 11:40 AM Pager [phone]  If 7PM-7AM, please contact night-coverage [email or web address] Password [email or web address]

## 2017-02-20 NOTE — Telephone Encounter (Signed)
The prior authorization was denied on the Seroquel because it does not meet antipsychotics prior authorization criteria requirements. Quetiapine is approved for dementia-related psychosis when it is severe or when the member or others are put in danger by the associated agitation, combativeness, or violent behavior. In this case, the dementia-related psychosis is not severe, nor does the behavior of the member put the member or others in danger.

## 2017-02-20 NOTE — Discharge Instructions (Signed)
Aspiration Precautions, Adult Aspiration is the breathing in (inhalation) of a liquid or object into the lungs. Things that can be inhaled into the lungs include:  Food.  Any type of liquid, such as drinks or saliva.  Stomach contents, such as vomit or stomach acid.  What are the signs of aspiration? Signs of aspiration include:  Coughing after swallowing food or liquids.  Clearing the throat often while eating.  Trouble breathing. This may include: ? Breathing quickly. ? Breathing very slowly. ? Loud breathing. ? Rumbling sounds from the lungs while breathing.  Coughing up phlegm (sputum) that: ? Is yellow, tan, or green. ? Has pieces of food in it. ? Is bad-smelling.  Having a hoarse, barky cough.  Not being able to speak.  A hoarse voice.  Drooling while eating.  A feeling of fullness in the throat or a feeling that something is stuck in the throat.  Choking often.  Having a runny noise while eating.  Coughing when lying down or having to sit up quickly after lying down.  A change in skin color. The skin may look red or blue.  Fever.  Watery eyes.  Pain in the chest or back.  A pained look on the face.  What are the complications of aspiration? Complications of aspiration include:  Losing weight because the person is not absorbing needed nutrients.  Loss of enjoyment and the social benefits of eating.  Choking.  Lung irritation, if someone aspirates acidic food or drinks.  Lung infection (pneumonia).  Collection of infected liquid (pus) in the lungs (lung abscess).  In serious cases, death can occur. What can I do to prevent aspiration? Caring for someone who has a feeding tube If you are caring for someone who has a feeding tube who cannot eat or drink safely through his or her mouth:  Keep the person in an upright position as much as possible.  Do not lay the person flat if he or she is getting continuous feedings. If you need to lay the  person flat for any reason, turn the feeding pump off.  Check feeding tube residuals as told by your health care provider. Ask your health care provider what residual amount is too high.  Caring for someone who can eat and drink safely by mouth If you are caring for someone who can eat and drink safely through his or her mouth:  Have the person sit in an upright position when eating food or drinking fluids. This can be done in two ways: ? Have the person sit up in a chair. ? If sitting in a chair is not possible, position the person in bed so he or she is upright.  Remind the person to eat slowly and chew well. Make sure the person is awake and alert while eating.  Do not distract the person. This is especially important for people with thinking or memory (cognitive) problems.  Allow foods to cool. Hot foods may be more difficult to swallow.  Provide small meals more frequently, instead of 3 large meals. This may reduce fatigue during eating.  Check the person's mouth thoroughly for leftover food after eating.  Keep the person sitting upright for 30-45 minutes after eating.  Do not serve food or drink during 2 hours or more before bedtime.  General instructions Follow these general guidelines to prevent aspiration in someone who can eat and drink safely by mouth:  Never put food or liquids in the mouth of a person who is  not fully alert.  Feed small amounts of food. Do not force feed.  For a person who is on a diet for swallowing difficulty (dysphagia diet), follow the recommended food and drink consistency. For example, in dysphagia diet level 1, thicken liquids to pudding-like consistency.  Use as little water as possible when brushing the person's teeth or cleaning his or her mouth.  Provide oral care before and after meals.  Use adaptive devices such as cut-out cups, straws, or utensils as told by the health care provider.  Crush pills and put them in soft food such as  pudding or ice cream. Some pills should not be crushed. Check with the health care provider before crushing any medicine.  Contact a health care provider if:  The person has a feeding tube, and the feeding tube residual amount is too high.  The person has a fever.  The person tries to avoid food or water, such as refusing to eat, drink, or be fed, or is eating less than normal.  The person may have aspirated food or liquid.  You notice warning signs, such as choking or coughing, when the person eats or drinks. Get help right away if:  The person has trouble breathing or starts to breathe quickly.  The person is breathing very slowly or stops breathing.  The person coughs a lot after eating or drinking.  The person has a long-lasting (chronic) cough.  The person coughs up thick, yellow, or tan sputum.  If someone is choking on food or an object, perform the Heimlich maneuver (abdominal thrusts).  The person has symptoms of pneumonia, such as: ? Coughing a lot. ? Coughing up mucus with a bad smell or blood in it. ? Feeling short of breath. ? Complaining of chest pain. ? Sweating, fever, and chills. ? Feeling tired. ? Complaining of trouble breathing. ? Wheezing.  The person cannot stop choking.  The person is unable to breathe, turns blue, faints, or seems confused. These symptoms may represent a serious problem that is an emergency. Do not wait to see if the symptoms will go away. Get medical help right away. Call your local emergency services (911 in the [place]  Summary  Aspiration is the breathing in (inhalation) of a liquid or object into the lungs. Things that can be inhaled into the lungs include food, liquids, saliva, or stomach contents.  Aspiration can cause pneumonia or choking.  One sign of aspiration is coughing after swallowing food or liquids.  Contact a health care provider if you notice signs of aspiration. This information is not intended to replace  advice given to you by your health care provider. Make sure you discuss any questions you have with your health care provider. Document Released: 09/02/2010 Document Revised: 04/27/2016 Document Reviewed: 04/27/2016 Elsevier Interactive Patient Education  2018 [employer].  ================================================================================================  Information on my medicine - ELIQUIS (apixaban)  This medication education was reviewed with me or my healthcare representative as part of my discharge preparation.    Why was Eliquis prescribed for you? Eliquis was prescribed for you to reduce the risk of a blood clot forming that can cause a stroke if you have a medical condition called atrial fibrillation (a type of irregular heartbeat).  What do You need to know about Eliquis ? Take your Eliquis TWICE DAILY - one tablet in the morning and one tablet in the evening with or without food. If you have difficulty swallowing the tablet whole please discuss with your pharmacist how  to take the medication safely.  Take Eliquis exactly as prescribed by your doctor and DO NOT stop taking Eliquis without talking to the doctor who prescribed the medication.  Stopping may increase your risk of developing a stroke.  Refill your prescription before you run out.  After discharge, you should have regular check-up appointments with your healthcare provider that is prescribing your Eliquis.  In the future your dose may need to be changed if your kidney function or weight changes by a significant amount or as you get older.  What do you do if you miss a dose? If you miss a dose, take it as soon as you remember on the same day and resume taking twice daily.  Do not take more than one dose of ELIQUIS at the same time to make up a missed dose.  Important Safety Information A possible side effect of Eliquis is bleeding. You should call your healthcare provider right away if you  experience any of the following: ? Bleeding from an injury or your nose that does not stop. ? Unusual colored urine (red or dark brown) or unusual colored stools (red or black). ? Unusual bruising for unknown reasons. ? A serious fall or if you hit your head (even if there is no bleeding).  Some medicines may interact with Eliquis and might increase your risk of bleeding or clotting while on Eliquis. To help avoid this, consult your healthcare provider or pharmacist prior to using any new prescription or non-prescription medications, including herbals, vitamins, non-steroidal anti-inflammatory drugs (NSAIDs) and supplements.  This website has more information on Eliquis (apixaban): http://www.eliquis.com/eliquis/home

## 2017-02-21 LAB — BASIC METABOLIC PANEL
ANION GAP: 7 (ref 5–15)
BUN: 16 mg/dL (ref 6–20)
CHLORIDE: 113 mmol/L — AB (ref 101–111)
CO2: 21 mmol/L — AB (ref 22–32)
Calcium: 8.7 mg/dL — ABNORMAL LOW (ref 8.9–10.3)
Creatinine, Ser: 1.13 mg/dL (ref 0.61–1.24)
GFR calc Af Amer: >60 mL/min (ref >60)
GFR, EST NON AFRICAN AMERICAN: 56 mL/min — AB (ref >60)
GLUCOSE: 111 mg/dL — AB (ref 65–99)
POTASSIUM: 3.5 mmol/L (ref 3.5–5.1)
Sodium: 141 mmol/L (ref 135–145)

## 2017-02-21 LAB — CBC
HEMATOCRIT: 34.9 % — AB (ref 39.0–52.0)
Hemoglobin: 11 g/dL — ABNORMAL LOW (ref 13.0–17.0)
MCH: 28.1 pg (ref 26.0–34.0)
MCHC: 31.5 g/dL (ref 30.0–36.0)
MCV: 89.3 fL (ref 78.0–100.0)
Platelets: 278 10*3/uL (ref 150–400)
RBC: 3.91 MIL/uL — AB (ref 4.22–5.81)
RDW: 16.8 % — ABNORMAL HIGH (ref 11.5–15.5)
WBC: 5.2 10*3/uL (ref 4.0–10.5)

## 2017-02-21 NOTE — Progress Notes (Signed)
RN gave report to receiving nurse at [facility]. Neuro assessment unchanged. Patient's family given AVS

## 2017-02-21 NOTE — Telephone Encounter (Signed)
Spoke with daughter Patriciaann Clan who verbalized understanding. The family is deciding right now between Hospice or a nursing home. They will update when they make a decision.

## 2017-02-21 NOTE — Progress Notes (Signed)
Patient is waking up confused, attempting to get out of bed over night, short term memory not doing well. Will continue to monitor.

## 2017-02-21 NOTE — Telephone Encounter (Signed)
Called and left a voicemail message asking for a return phone call 

## 2017-02-21 NOTE — Progress Notes (Signed)
CSW informed from patient's family that the patient will be going to [facility] upon discharge. CSW will submit clinicals to [facility] for insurance approval.  CSW will continue to follow to facilitate discharge to SNF.  Laveda Abbe, [place] Clinical Social Worker [phone]

## 2017-02-21 NOTE — Care Management Note (Signed)
Case Management Note  Patient Details  Name: Thomas [name] MRN: [id number] Date of Birth: 21-Jul-1926  Subjective/Objective:                    Action/Plan: Pt discharging to [facility]. No further needs per CM.   Expected Discharge Date:  02/21/17               Expected Discharge Plan:  Skilled Nursing Facility  In-House Referral:  Clinical Social Work  Discharge planning Services     Post Acute Care Choice:    Choice offered to:     DME Arranged:    DME Agency:     HH Arranged:    [place] Agency:     Status of Service:  Completed, signed off  If discussed at [employer], dates discussed:    Additional Comments:  Pollie Friar, RN 02/21/2017, 1:46 PM

## 2017-02-21 NOTE — Progress Notes (Signed)
Discharge to: [facility] Anticipated discharge date: 02/21/17 Family notified: Yes, at bedside and by phone Transportation by: [facility]  Report #: [phone], Room 308  CSW signing off.  Laveda Abbe LCSW [phone]

## 2017-02-21 NOTE — Discharge Summary (Signed)
[facility]  Physician Discharge Summary   Patient ID: Thomas [name] MRN: [id number] DOB/AGE: 04/12/1926 83 y.o.  Admit date: 02/15/2017 Discharge date: 02/21/2017  PCP: Marin Olp, MD  DISCHARGE DIAGNOSES:  Principal Problem:   Acute ischemic multifocal posterior circulation stroke [facility]) Active Problems:   Essential hypertension   PROSTATE CANCER, HX OF   Malignant mesothelioma of pleura (HCC)   Permanent atrial fibrillation (HCC)   Alcohol abuse   Memory loss due to medical condition   History of CVA (cerebrovascular accident)   DNR (do not resuscitate)   Facial droop   Slurred speech   Basilar artery occlusion   Acute ischemic stroke ([place])   Esophageal stricture   Acute lower UTI   Dementia without behavioral disturbance   Diabetes mellitus type 2 in nonobese (HCC)   RECOMMENDATIONS FOR OUTPATIENT FOLLOW UP: 1. Please check CBC and basic metabolic panel next week 2. Outpatient follow-up with neurology as mentioned below 3. Consider palliative medicine input if patient does not progress.  DISCHARGE CONDITION: fair  Diet recommendation: Dysphagia 1 diet with nectar thick liquids  Filed Weights   02/15/17 1900 02/15/17 2250  Weight: 72.7 kg (160 lb 4.4 oz) 74 kg (163 lb 3.2 oz)    INITIAL HISTORY: Pt is 83 yo male with known dementia, HTN, prior CVA's 5-6 episodes (on Eliquis), hx of prostate cancer treated with seed implants, recent UTI treated with ABX, presented with main concern of slurred speech, right facial droop that was noted several hours prior to admission.  Consultations:  Neurology   HOSPITAL COURSE:   Stroke, posterior circulation, right cerebellum, multifocal areas in brain stem  MRIheadshowed continued progression of severe vertebrobasilar system stenosis, loss of basial artery flow, lack of bilateral PCA flow, acute on subacute post circulation infarcts, multifocal new involvement of the brainstem  2D Echo:recent one done  12/2016 with stable EF 50-55%, sclerotic aortic valve   LDL91  PT eval done, Rehab MD says SNF more appropriate   Patient to continue anticoagulation as before. He is on Eliquis along with Aspirin per neurology recommendations.  Okay for discharge to skilled nursing facility  Hypertension, accelerated Permissive hypertension was allowed. Continue home medication regimen for now.  UTI, enterobacter Patient has been on Levaquin for 3 days but due to prolonged QRS Levaquin was discontinued and patient was started on Bactrim , which was also subsequently discontinued. Patient denies any urinary complaints.  Hyperlipidemia - LDL 91. Patient is on a statin medication, which will be continued.  CODE STATUS is DO NOT RESUSCITATE.  Overall, stable. Discussed with the son today. Okay for discharge.   PERTINENT LABS:  The results of significant diagnostics from this hospitalization (including imaging, microbiology, ancillary and laboratory) are listed below for reference.    Microbiology: Recent Results (from the past 240 hour(s))  Urine Culture     Status: None   Collection Time: 02/12/17  4:04 PM  Result Value Ref Range Status   Culture ENTEROBACTER CLOACAE  Final   Colony Count Greater than 100,000 CFU/mL  Final   Organism ID, Bacteria ENTEROBACTER CLOACAE  Final      Susceptibility   Enterobacter cloacae -  (no method available)    AMOX/CLAVULANIC >=32 Resistant     PIP/TAZO 8 Sensitive     IMIPENEM 0.5 Sensitive     CEFAZOLIN >=64 Resistant     CEFTRIAXONE <=1 Sensitive     CEFTAZIDIME <=1 Sensitive     CEFEPIME <=1 Sensitive     GENTAMICIN <=  1 Sensitive     TOBRAMYCIN <=1 Sensitive     CIPROFLOXACIN <=0.25 Sensitive     LEVOFLOXACIN <=0.12 Sensitive     NITROFURANTOIN 64 Intermediate     TRIMETH/SULFA* <=20 Sensitive      * NR=NOT REPORTABLE,SEE COMMENTORAL therapy:A cefazolin MIC of <32 predicts susceptibility to the oral agents  cefaclor,cefdinir,cefpodoxime,cefprozil,cefuroxime,cephalexin,and loracarbef when used for therapy of uncomplicated UTIs due to E.coli,K.pneumomiae,and P.mirabilis. PARENTERAL therapy: A cefazolinMIC of >8 indicates resistance to parenteralcefazolin. An alternate test method must beperformed to confirm susceptibility to parenteralcefazolin.     Labs: Basic Metabolic Panel:  Recent Labs Lab 02/17/17 1213 02/18/17 0352 02/19/17 0140 02/20/17 0305 02/21/17 0507  NA 136 137 141 142 141  K 3.0* 2.8* 3.2* 3.4* 3.5  CL 109 108 112* 112* 113*  CO2 15* 17* 21* 20* 21*  GLUCOSE 136* 118* 133* 108* 111*  BUN 14 11 10 16 16   CREATININE 1.04 1.13 1.09 1.08 1.13  CALCIUM 8.6* 8.6* 8.4* 8.6* 8.7*  MG  --  2.0 2.0  --   --    Liver Function Tests:  Recent Labs Lab 02/15/17 1911  AST 22  ALT 14*  ALKPHOS 80  BILITOT 0.8  PROT 7.8  ALBUMIN 3.4*   CBC:  Recent Labs Lab 02/15/17 1911  02/17/17 1213 02/18/17 0352 02/19/17 0140 02/20/17 0305 02/21/17 0507  WBC 7.6  --  9.2 7.8 6.7 5.6 5.2  NEUTROABS 5.2  --   --   --   --   --   --   HGB 12.7*  < > 12.2* 12.4* 11.0* 11.0* 11.0*  HCT 39.8  < > 37.1* 38.0* 34.4* 35.3* 34.9*  MCV 88.8  --  88.3 87.6 88.2 89.8 89.3  PLT 267  --  268 287 272 268 278  < > = values in this interval not displayed.  CBG:  Recent Labs Lab 02/15/17 1911  GLUCAP 150*     IMAGING STUDIES Dg Chest 2 View  Result Date: 02/16/2017 CLINICAL DATA:  CVA EXAM: CHEST  2 VIEW COMPARISON:  01/14/2017 FINDINGS: Complete opacification of the left hemithorax again noted, stable. Right lung is clear. Heart is mildly enlarged. No change since prior study. IMPRESSION: Stable complete opacification of the left hemithorax, stable. Electronically Signed   By: Rolm Baptise M.D.   On: 02/16/2017 11:03   Mr Brain Wo Contrast  Addendum Date: 02/16/2017   ADDENDUM REPORT: 02/16/2017 12:05 ADDENDUM: Study discussed by telephone with Dr. Doyle Askew On 02/16/2017 at 12:03 .  Electronically Signed   By: Genevie Ann M.D.   On: 02/16/2017 12:05   Result Date: 02/16/2017 CLINICAL DATA:  83 year old male with new onset slurred speech and facial droop since yesterday. Right side posterior circulation infarcts in May and June. EXAM: MRI HEAD WITHOUT CONTRAST MRA HEAD WITHOUT CONTRAST TECHNIQUE: Multiplanar, multiecho pulse sequences of the brain and surrounding structures were obtained without intravenous contrast. Angiographic images of the head were obtained using MRA technique without contrast. COMPARISON:  Brain MRI 01/22/2017 and earlier. CTA head and neck 01/05/2017. Intracranial MRA 12/14/2016. FINDINGS: MRI HEAD FINDINGS Brain: New restricted diffusion in the right lateral pons and left paracentral pons. New small foci of cortical restricted diffusion in the inferior left occipital lobe, and at the right temporal lobe tip. New patchy areas of restricted diffusion in the right cerebellar hemisphere, including the right cerebellar peduncle, corresponding to areas which were relatively spared by the large right inferior cerebellar infarct on 01/06/2017. There is also punctate restricted  diffusion in the left thalamus. Continued restricted diffusion in the dorsal right medulla, and right ventral midbrain. No anterior circulation restricted diffusion identified. Associated T2 and FLAIR hyperintensity in the acutely affected areas with no associated hemorrhage or mass effect. Developing encephalomalacia in the right cerebellar hemisphere and both occipital poles. Patchy and confluent bilateral cerebral white matter T2 and FLAIR hyperintensity elsewhere is stable. A small area of cortical encephalomalacia in the superior left parietal lobe is stable. No midline shift, mass effect, evidence of mass lesion, ventriculomegaly, extra-axial collection or acute intracranial hemorrhage. Cervicomedullary junction and pituitary are within normal limits. Vascular: Worsening basilar artery flow void since  01/22/2017. Continued abnormal distal left V4 segment flow void. Other Major intracranial vascular flow voids are stable. Skull and upper cervical spine: Negative. Visualized bone marrow signal is within normal limits. Sinuses/Orbits: Stable an negative. Other: Stable mild mastoid effusions.  Negative scalp soft tissues. MRA HEAD FINDINGS Little antegrade flow in the distal vertebral arteries and basilar artery, appearing similar to but worse than the CTA head and neck on 01/05/2017, and severely progressed loss of basilar artery flow since the MRA on 12/14/2016. Preserved left PICA flow signal. No significant flow in the bilateral PCAs. Antegrade flow in both ICA siphons appears stable since 12/14/2016, although there is mild to moderate left and moderate to severe right ICA siphon stenosis. Both carotid termini remain patent. MCA and ACA origins remain patent. Visible bilateral MCA and ACA branches are stable since 12/14/2016. IMPRESSION: 1. Continued progression of severe vertebrobasilar system stenosis and loss of basilar artery flow since the Head and neck CTA on 12/31/2016. Lack of bilateral PCA flow signal today. 2. Acute on subacute posterior circulation infarcts with fairly extensive new involvement of the right cerebellum, and multifocal new involvement of the brainstem. 3. No associated hemorrhage or mass effect. 4. The anterior circulation is stable the 12/14/2016 MRA, with up to moderate left and severe right ICA siphon stenosis. Electronically Signed: By: Genevie Ann M.D. On: 02/16/2017 11:43   Dg Swallowing Func-speech Pathology  Result Date: 02/17/2017 Objective Swallowing Evaluation: Type of Study: MBS-Modified Barium Swallow Study Patient Details Name: Daeveon Zweber MRN: [id number] Date of Birth: 1926-07-28 Today's Date: 02/17/2017 Time: SLP Start Time (ACUTE ONLY): 1045-SLP Stop Time (ACUTE ONLY): 1125 SLP Time Calculation (min) (ACUTE ONLY): 40 min Past Medical History: Past Medical History:  Diagnosis Date . Adenomatous colon polyp 2009 . Allergy  . Carotid artery occlusion  . Diverticulosis of colon (without mention of hemorrhage)  . ED (erectile dysfunction)  . Esophageal stricture  . Esophagitis  . GERD (gastroesophageal reflux disease)  . Goals of care, counseling/discussion 12/23/2016 . Hiatal hernia  . Hypertension  . Prostate cancer ([place])  . Unspecified gastritis and gastroduodenitis without mention of hemorrhage  Past Surgical History: Past Surgical History: Procedure Laterality Date . ENDARTERECTOMY Left 03/05/2013  Procedure: ENDARTERECTOMY CAROTID;  Surgeon: Conrad North Haledon, MD;  Location: [facility];  Service: Vascular;  Laterality: Left; . ESOPHAGOGASTRODUODENOSCOPY (EGD) WITH ESOPHAGEAL DILATION   . PROSTATE SURGERY    Laser surgery HPI: Pt is an 83 y.o. male with PMH: dementia, HTN, multiple prior CVA's, mesothelioma, who presented to ED with right sided facial droop and slurred speech at his SNF on 7/5. Upon hospital admission, daughter reported that patient drinks nectar thick liquids and regular solids at home. He has had two recent MBS during previous hospitalizations, on 5/25 and 6/8, both of which recommended nectar thick liquids and mechanical soft solids due to aspiration of  thin liquids and oral and swallow initiation delays.  Subjective: patient alert but appeared fatigued Assessment / Plan / Recommendation CHL IP CLINICAL IMPRESSIONS 02/17/2017 Clinical Impression Patient presents with a severe oral and mod-severe sensorimotor pharyngeal dysphagia with some impact from patient's current level of alertness(appears fatigued/tired). Patient's oral dysphagia is characterized by lingual manipulation and transit delays, and decreased ability to masticate soft solid textures. Pharyngeal phase is characterized by swallow initiation delay to vallecular sinus with puree solids, nectar thick liquids, honey thick liquids and mechanical soft solids, and delays to pyriform sinus with thin liquids.  Patient did require cues for first and at times second swallow, however he was able to fully clear pharyngeal residuals. Despite significant delays of swallow initiation, he demonstrated good airway protection with honey thick, puree, nectar thick and soft solid textures. He exhibited one instance of flash penetration with nectar thick liquids via cup sips and exhibited consistent flash penetration with thin liquids via spoon and via cup sips. As compared to recent two MBS's, patient's oral dysphagia is worse on today's exam, but he did not exhibit the level of penetration and aspiration with thins as on the previous tests. SLP Visit Diagnosis Dysphagia, oropharyngeal phase (R13.12) Attention and concentration deficit following -- Frontal lobe and executive function deficit following -- Impact on safety and function Moderate aspiration risk   CHL IP TREATMENT RECOMMENDATION 02/17/2017 Treatment Recommendations Therapy as outlined in treatment plan below   Prognosis 02/17/2017 Prognosis for Safe Diet Advancement Good Barriers to Reach Goals Cognitive deficits Barriers/Prognosis Comment -- CHL IP DIET RECOMMENDATION 02/17/2017 SLP Diet Recommendations Dysphagia 1 (Puree) solids;Nectar thick liquid Liquid Administration via Spoon Medication Administration Crushed with puree Compensations Minimize environmental distractions;Slow rate;Small sips/bites Postural Changes Seated upright at 90 degrees;Remain semi-upright after after feeds/meals (Comment)   CHL IP OTHER RECOMMENDATIONS 02/17/2017 Recommended Consults -- Oral Care Recommendations Oral care BID Other Recommendations Order thickener from pharmacy;Prohibited food (jello, ice cream, thin soups);Remove water pitcher   CHL IP FOLLOW UP RECOMMENDATIONS 02/17/2017 Follow up Recommendations Skilled Nursing facility   [facility] IP FREQUENCY AND DURATION 02/17/2017 Speech Therapy Frequency (ACUTE ONLY) min 2x/week Treatment Duration 2 weeks      CHL IP ORAL PHASE 02/17/2017 Oral Phase  Impaired Oral - Pudding Teaspoon -- Oral - Pudding Cup -- Oral - Honey Teaspoon Weak lingual manipulation;Lingual/palatal residue;Delayed oral transit Oral - Honey Cup Weak lingual manipulation;Delayed oral transit;Lingual/palatal residue Oral - Nectar Teaspoon Weak lingual manipulation;Delayed oral transit Oral - Nectar Cup Delayed oral transit;Weak lingual manipulation Oral - Nectar Straw NT Oral - Thin Teaspoon Right anterior bolus loss;Weak lingual manipulation;Delayed oral transit Oral - Thin Cup Delayed oral transit;Weak lingual manipulation Oral - Thin Straw NT Oral - Puree Weak lingual manipulation;Right anterior bolus loss;Delayed oral transit;Lingual/palatal residue Oral - Mech Soft Weak lingual manipulation;Impaired mastication;Delayed oral transit;Lingual/palatal residue Oral - Regular NT Oral - Multi-Consistency NT Oral - Pill NT Oral Phase - Comment --  CHL IP PHARYNGEAL PHASE 02/17/2017 Pharyngeal Phase Impaired Pharyngeal- Pudding Teaspoon -- Pharyngeal -- Pharyngeal- Pudding Cup -- Pharyngeal -- Pharyngeal- Honey Teaspoon Delayed swallow initiation-vallecula Pharyngeal Material does not enter airway Pharyngeal- Honey Cup Delayed swallow initiation-vallecula;Pharyngeal residue - valleculae Pharyngeal Material does not enter airway Pharyngeal- Nectar Teaspoon Delayed swallow initiation-vallecula Pharyngeal Material does not enter airway Pharyngeal- Nectar Cup Delayed swallow initiation-vallecula;Penetration/Aspiration during swallow;Pharyngeal residue - valleculae Pharyngeal Material enters airway, remains ABOVE vocal cords then ejected out Pharyngeal- Nectar Straw NT Pharyngeal -- Pharyngeal- Thin Teaspoon Penetration/Aspiration during swallow;Delayed swallow initiation-pyriform sinuses;Reduced tongue  base retraction;Reduced airway/laryngeal closure Pharyngeal Material enters airway, remains ABOVE vocal cords then ejected out Pharyngeal- Thin Cup Delayed swallow initiation-pyriform sinuses;Reduced  airway/laryngeal closure;Reduced tongue base retraction;Penetration/Aspiration during swallow Pharyngeal Material enters airway, remains ABOVE vocal cords then ejected out Pharyngeal- Thin Straw NT Pharyngeal -- Pharyngeal- Puree Delayed swallow initiation-vallecula;Pharyngeal residue - valleculae Pharyngeal -- Pharyngeal- Mechanical Soft Delayed swallow initiation-vallecula;Pharyngeal residue - valleculae Pharyngeal -- Pharyngeal- Regular NT Pharyngeal -- Pharyngeal- Multi-consistency NT Pharyngeal -- Pharyngeal- Pill -- Pharyngeal -- Pharyngeal Comment --  CHL IP CERVICAL ESOPHAGEAL PHASE 02/17/2017 Cervical Esophageal Phase WFL Pudding Teaspoon -- Pudding Cup -- Honey Teaspoon -- Honey Cup -- Nectar Teaspoon -- Nectar Cup -- Nectar Straw -- Thin Teaspoon -- Thin Cup -- Thin Straw -- Puree -- Mechanical Soft -- Regular -- Multi-consistency -- Pill -- Cervical Esophageal Comment -- CHL IP GO 12/14/2016 Functional Assessment Tool Used clinical judgment Functional Limitations Memory Swallow Current Status (V4098) (None) Swallow Goal Status (J1914) (None) Swallow Discharge Status (N8295) (None) Motor Speech Current Status (A2130) (None) Motor Speech Goal Status (Q6578) (None) Motor Speech Goal Status (I6962) (None) Spoken Language Comprehension Current Status (X5284) (None) Spoken Language Comprehension Goal Status (X3244) (None) Spoken Language Comprehension Discharge Status [phone]) (None) Spoken Language Expression Current Status (O5366) (None) Spoken Language Expression Goal Status (Y4034) (None) Spoken Language Expression Discharge Status [phone]) (None) Attention Current Status (D6387) (None) Attention Goal Status (F6433) (None) Attention Discharge Status (I9518) (None) Memory Current Status (A4166) CK Memory Goal Status (A6301) CK Memory Discharge Status (G9170) CK Voice Current Status (G9171) (None) Voice Goal Status (S0109) (None) Voice Discharge Status (N2355) (None) Other Speech-Language Pathology Functional  Limitation Current Status (D3220) (None) Other Speech-Language Pathology Functional Limitation Goal Status (U5427) (None) Other Speech-Language Pathology Functional Limitation Discharge Status [phone]) (None) Sonia Baller, MA, CCC-SLP 02/17/17 4:45 PM              Mr Jodene Nam Head/brain Wo Cm  Addendum Date: 02/16/2017   ADDENDUM REPORT: 02/16/2017 12:05 ADDENDUM: Study discussed by telephone with Dr. Doyle Askew On 02/16/2017 at 12:03 . Electronically Signed   By: Genevie Ann M.D.   On: 02/16/2017 12:05   Result Date: 02/16/2017 CLINICAL DATA:  83 year old male with new onset slurred speech and facial droop since yesterday. Right side posterior circulation infarcts in May and June. EXAM: MRI HEAD WITHOUT CONTRAST MRA HEAD WITHOUT CONTRAST TECHNIQUE: Multiplanar, multiecho pulse sequences of the brain and surrounding structures were obtained without intravenous contrast. Angiographic images of the head were obtained using MRA technique without contrast. COMPARISON:  Brain MRI 01/22/2017 and earlier. CTA head and neck 01/05/2017. Intracranial MRA 12/14/2016. FINDINGS: MRI HEAD FINDINGS Brain: New restricted diffusion in the right lateral pons and left paracentral pons. New small foci of cortical restricted diffusion in the inferior left occipital lobe, and at the right temporal lobe tip. New patchy areas of restricted diffusion in the right cerebellar hemisphere, including the right cerebellar peduncle, corresponding to areas which were relatively spared by the large right inferior cerebellar infarct on 01/06/2017. There is also punctate restricted diffusion in the left thalamus. Continued restricted diffusion in the dorsal right medulla, and right ventral midbrain. No anterior circulation restricted diffusion identified. Associated T2 and FLAIR hyperintensity in the acutely affected areas with no associated hemorrhage or mass effect. Developing encephalomalacia in the right cerebellar hemisphere and both occipital poles.  Patchy and confluent bilateral cerebral white matter T2 and FLAIR hyperintensity elsewhere is stable. A small area of cortical encephalomalacia in the superior left parietal lobe is  stable. No midline shift, mass effect, evidence of mass lesion, ventriculomegaly, extra-axial collection or acute intracranial hemorrhage. Cervicomedullary junction and pituitary are within normal limits. Vascular: Worsening basilar artery flow void since 01/22/2017. Continued abnormal distal left V4 segment flow void. Other Major intracranial vascular flow voids are stable. Skull and upper cervical spine: Negative. Visualized bone marrow signal is within normal limits. Sinuses/Orbits: Stable an negative. Other: Stable mild mastoid effusions.  Negative scalp soft tissues. MRA HEAD FINDINGS Little antegrade flow in the distal vertebral arteries and basilar artery, appearing similar to but worse than the CTA head and neck on 01/05/2017, and severely progressed loss of basilar artery flow since the MRA on 12/14/2016. Preserved left PICA flow signal. No significant flow in the bilateral PCAs. Antegrade flow in both ICA siphons appears stable since 12/14/2016, although there is mild to moderate left and moderate to severe right ICA siphon stenosis. Both carotid termini remain patent. MCA and ACA origins remain patent. Visible bilateral MCA and ACA branches are stable since 12/14/2016. IMPRESSION: 1. Continued progression of severe vertebrobasilar system stenosis and loss of basilar artery flow since the Head and neck CTA on 12/31/2016. Lack of bilateral PCA flow signal today. 2. Acute on subacute posterior circulation infarcts with fairly extensive new involvement of the right cerebellum, and multifocal new involvement of the brainstem. 3. No associated hemorrhage or mass effect. 4. The anterior circulation is stable the 12/14/2016 MRA, with up to moderate left and severe right ICA siphon stenosis. Electronically Signed: By: Genevie Ann M.D. On:  02/16/2017 11:43   Ct Head Code Stroke W/o Cm  Result Date: 02/15/2017 CLINICAL DATA:  Code stroke. Slurred speech, RIGHT facial droop. Last seen normal at 1800 hours. History of hypertension, prostate cancer, diabetes, stroke. EXAM: CT HEAD WITHOUT CONTRAST TECHNIQUE: Contiguous axial images were obtained from the base of the skull through the vertex without intravenous contrast. COMPARISON:  MRI of the head January 22, 2017 and CT HEAD January 18, 2017. FINDINGS: BRAIN: No intraparenchymal hemorrhage, mass effect nor midline shift. Mesial LEFT parietoccipital and RIGHT occipital lobe encephalomalacia. Old RIGHT cerebellar infarcts. Old RIGHT basal ganglia lacunar infarct versus lacunar infarct. Old small LEFT parietal vertex infarct. Confluent supratentorial white matter hypodensities. No midline shift, mass effect or acute large vascular territory infarct. No abnormal extra-axial fluid collections. Basal cisterns are patent. VASCULAR: Moderate to severe calcific atherosclerosis of the carotid siphons. SKULL: No skull fracture. No significant scalp soft tissue swelling. SINUSES/ORBITS: The mastoid air-cells and included paranasal sinuses are well-aerated.The included ocular globes and orbital contents are non-suspicious. OTHER: None. ASPECTS [facility] Stroke Program Early CT Score) - Ganglionic level infarction (caudate, lentiform nuclei, internal capsule, insula, M1-M3 cortex): 7 - Supraganglionic infarction (M4-M6 cortex): 3 Total score (0-10 with 10 being normal): 10 IMPRESSION: 1. No acute intracranial process. 2. Old bilateral PCA territory and RIGHT posterior-inferior cerebellar artery territory infarcts. Old small LEFT parietal lobe infarct. Old basal ganglia lacunar infarcts. 3. Severe chronic small vessel ischemic disease. 4. ASPECTS is 10. Critical Value/emergent results were called by telephone at the time of interpretation on 02/15/2017 at 7:30 pm to Dr. Paschal Dopp, Neurology, who verbally acknowledged these  results. Electronically Signed   By: Elon Alas M.D.   On: 02/15/2017 19:34    DISCHARGE EXAMINATION: Vitals:   02/20/17 2127 02/21/17 0056 02/21/17 0702 02/21/17 0929  BP: (!) 167/95 (!) 147/67 (!) 181/91 (!) 144/81  Pulse: 78 (!) 51 75 75  Resp: 18 18 18 17   Temp: 98.4 F (36.9  C) 97.7 F (36.5 C) 99 F (37.2 C) 99.1 F (37.3 C)  TempSrc: Oral Oral Oral Oral  SpO2: 99% 100% 99% 99%  Weight:      Height:       General appearance: alert, cooperative and no distress. Occasionally distracted. Resp: clear to auscultation bilaterally Cardio: regular rate and rhythm, S1, S2 normal, no murmur, click, rub or gallop GI: soft, non-tender; bowel sounds normal; no masses,  no organomegaly Extremities: extremities normal, atraumatic, no cyanosis or edema  DISPOSITION: Skilled nursing facility  Discharge Instructions    Call MD for:  extreme fatigue    Complete by:  As directed    Call MD for:  persistant dizziness or light-headedness    Complete by:  As directed    Call MD for:  persistant nausea and vomiting    Complete by:  As directed    Call MD for:  redness, tenderness, or signs of infection (pain, swelling, redness, odor or green/yellow discharge around incision site)    Complete by:  As directed    Call MD for:  temperature >100.4    Complete by:  As directed    Discharge instructions    Complete by:  As directed    Please see instructions under discharge summary.  You were cared for by a hospitalist during your hospital stay. If you have any questions about your discharge medications or the care you received while you were in the hospital after you are discharged, you can call the unit and asked to speak with the hospitalist on call if the hospitalist that took care of you is not available. Once you are discharged, your primary care physician will handle any further medical issues. Please note that NO REFILLS for any discharge medications will be authorized once you are  discharged, as it is imperative that you return to your primary care physician (or establish a relationship with a primary care physician if you do not have one) for your aftercare needs so that they can reassess your need for medications and monitor your lab values. If you do not have a primary care physician, you can call [phone] for a physician referral.   Increase activity slowly    Complete by:  As directed       ALLERGIES:  Allergies  Allergen Reactions  . Penicillins Anaphylaxis and Swelling    THROAT SWELLS Has patient had a PCN reaction causing immediate rash, facial/tongue/throat swelling, SOB or lightheadedness with hypotension: Yes Has patient had a PCN reaction causing severe rash involving mucus membranes or skin necrosis: No Has patient had a PCN reaction that required hospitalization: No Has patient had a PCN reaction occurring within the last 10 years: No If all of the above answers are "NO", then may proceed with Cephalosporin use.   [name] Inhibitors Hives     Current Discharge Medication List    START taking these medications   Details  polyvinyl alcohol (LIQUIFILM TEARS) 1.4 % ophthalmic solution Place 1 drop into both eyes as needed for dry eyes. Qty: 15 mL, Refills: 0    senna-docusate (SENOKOT-S) 8.6-50 MG tablet Take 1 tablet by mouth at bedtime as needed for mild constipation.    traZODone (DESYREL) 50 MG tablet Take 1 tablet (50 mg total) by mouth at bedtime. Qty: 30 tablet, Refills: 0      CONTINUE these medications which have NOT CHANGED   Details  acetaminophen (TYLENOL) 325 MG tablet Take 650 mg by mouth every 6 (six)  hours as needed for mild pain, fever or headache.     amLODipine (NORVASC) 5 MG tablet Take 1 tablet (5 mg total) by mouth daily. Qty: 30 tablet, Refills: 3    apixaban (ELIQUIS) 5 MG TABS tablet Take 5 mg by mouth 2 (two) times daily.    aspirin EC 81 MG EC tablet Take 1 tablet (81 mg total) by mouth daily.    cloNIDine  (CATAPRES) 0.1 MG tablet Take 1 tablet (0.1 mg total) by mouth 2 (two) times daily. Qty: 90 tablet, Refills: 4    haloperidol (HALDOL) 1 MG tablet Take 1 mg by mouth once as needed for agitation.    QUEtiapine (SEROQUEL) 25 MG tablet Take 1 tablet (25 mg total) by mouth at bedtime. Qty: 30 tablet, Refills: 2    rosuvastatin (CRESTOR) 20 MG tablet Take 1 tablet (20 mg total) by mouth daily.      STOP taking these medications     metoCLOPramide (REGLAN) 5 MG tablet      cephALEXin (KEFLEX) 500 MG capsule      LORazepam (ATIVAN) 0.5 MG tablet           Contact information for follow-up providers    Penni Bombard, MD Follow up on 03/09/2017.   Specialties:  Neurology, Radiology Contact information: [address] [place] [address] [phone]        Marin Olp, MD Follow up.   Specialty:  Family Medicine Contact information: [address] [place] [address] [phone]            Contact information for after-discharge care    Destination    [facility] Follow up.   Specialty:  Skilled Nursing Facility Contact information: [place]                  TOTAL DISCHARGE TIME: 30 mins  [facility] Pager [phone]  02/21/2017, 12:49 PM

## 2017-03-06 ENCOUNTER — Telehealth: Payer: Self-pay | Admitting: Family Medicine

## 2017-03-06 NOTE — Telephone Encounter (Signed)
Verbal order provided as requested 

## 2017-03-06 NOTE — Telephone Encounter (Signed)
Nicolette with [facility] would like to have verbal orders home health skill nursing for  1 time week for 1 week, 2 times a week 2 weeks, 1 time a week for 8 weeks and 2 as needed visits.  Speech therapy and OP to evaluate and treat pt.

## 2017-03-08 ENCOUNTER — Telehealth: Payer: Self-pay | Admitting: Family Medicine

## 2017-03-08 NOTE — Telephone Encounter (Signed)
Minda with [facility] states at the pt's request, they will are going to reschedule the pt's speech eval to next week.

## 2017-03-09 ENCOUNTER — Encounter: Payer: Self-pay | Admitting: Diagnostic Neuroimaging

## 2017-03-09 ENCOUNTER — Ambulatory Visit (INDEPENDENT_AMBULATORY_CARE_PROVIDER_SITE_OTHER): Payer: Medicare Other | Admitting: Diagnostic Neuroimaging

## 2017-03-09 VITALS — BP 116/69 | HR 78 | Ht 71.0 in | Wt 158.0 lb

## 2017-03-09 DIAGNOSIS — I679 Cerebrovascular disease, unspecified: Secondary | ICD-10-CM

## 2017-03-09 DIAGNOSIS — I6930 Unspecified sequelae of cerebral infarction: Secondary | ICD-10-CM | POA: Insufficient documentation

## 2017-03-09 DIAGNOSIS — I482 Chronic atrial fibrillation: Secondary | ICD-10-CM

## 2017-03-09 DIAGNOSIS — I4821 Permanent atrial fibrillation: Secondary | ICD-10-CM

## 2017-03-09 DIAGNOSIS — C450 Mesothelioma of pleura: Secondary | ICD-10-CM | POA: Diagnosis not present

## 2017-03-09 NOTE — Patient Instructions (Signed)
-   continue eliquis, aspirin, BP control, statin for stroke prevention  - continue quetiapine at bedtime and haldol as needed for agitation  - follow up with PCP (Dr. Yong Channel) and palliative care

## 2017-03-09 NOTE — Progress Notes (Signed)
[facility]  PATIENT: Thomas [name] DOB: 1925/12/15  REFERRING CLINICIAN: Curly Rim HISTORY FROM: patient, son, daughter, chart review REASON FOR VISIT: new consult    HISTORICAL  CHIEF COMPLAINT:  Chief Complaint  Patient presents with  . Stroke    rm 6, New Pt, Dr Erlinda Hong, hospital FU, son- Toney Sang, dgtr- Ivin Booty, "had stroke while in rehab, went to [facility] for rehab- had another stroke, then to [facility] rehab"     HISTORY OF PRESENT ILLNESS:   83 year old male with history of atrial fibrillation on liquids, mesothelioma with lung metastasis, prostate cancer, coronary artery disease, dementia, hypertension, history of left carotid endarterectomy, here for evaluation of hospital discharge follow-up for stroke. Patient had presented to hospital on 02/15/17 for new onset slurred speech and right facial weakness. Patient was evaluated for acute stroke. Patient was admitted for workup and management conservatively.  Since discharge patient is stable. He is undergoing palliative care treatment. No other questions or concerns from patient or family.    REVIEW OF SYSTEMS: Full 14 system review of systems performed and negative with exception of: Insomnia weakness memory loss confusion cough shortness of breath fatigue hearing loss anxiety decreased energy incontinence.  ALLERGIES: Allergies  Allergen Reactions  . Penicillins Anaphylaxis and Swelling    THROAT SWELLS Has patient had a PCN reaction causing immediate rash, facial/tongue/throat swelling, SOB or lightheadedness with hypotension: Yes Has patient had a PCN reaction causing severe rash involving mucus membranes or skin necrosis: No Has patient had a PCN reaction that required hospitalization: No Has patient had a PCN reaction occurring within the last 10 years: No If all of the above answers are "NO", then may proceed with Cephalosporin use.   [name] Inhibitors Hives    HOME MEDICATIONS: Outpatient  Medications Prior to Visit  Medication Sig Dispense Refill  . acetaminophen (TYLENOL) 325 MG tablet Take 650 mg by mouth every 6 (six) hours as needed for mild pain, fever or headache.     Marland Kitchen amLODipine (NORVASC) 5 MG tablet Take 1 tablet (5 mg total) by mouth daily. 30 tablet 3  . apixaban (ELIQUIS) 5 MG TABS tablet Take 5 mg by mouth 2 (two) times daily.    Marland Kitchen aspirin EC 81 MG EC tablet Take 1 tablet (81 mg total) by mouth daily. (Patient taking differently: Take 81 mg by mouth daily as needed (for chest pain). )    . cloNIDine (CATAPRES) 0.1 MG tablet Take 1 tablet (0.1 mg total) by mouth 2 (two) times daily. 90 tablet 4  . haloperidol (HALDOL) 1 MG tablet Take 1 mg by mouth once as needed for agitation.    . polyvinyl alcohol (LIQUIFILM TEARS) 1.4 % ophthalmic solution Place 1 drop into both eyes as needed for dry eyes. 15 mL 0  . QUEtiapine (SEROQUEL) 25 MG tablet Take 1 tablet (25 mg total) by mouth at bedtime. 30 tablet 2  . rosuvastatin (CRESTOR) 20 MG tablet Take 1 tablet (20 mg total) by mouth daily.    Marland Kitchen senna-docusate (SENOKOT-S) 8.6-50 MG tablet Take 1 tablet by mouth at bedtime as needed for mild constipation.    . traZODone (DESYREL) 50 MG tablet Take 1 tablet (50 mg total) by mouth at bedtime. 30 tablet 0   No facility-administered medications prior to visit.     PAST MEDICAL HISTORY: Past Medical History:  Diagnosis Date  . Adenomatous colon polyp 2009  . Allergy   . Atrial fibrillation ([place])   . Carotid artery occlusion   .  Diabetes mellitus without complication ([place])   . Diverticulosis of colon (without mention of hemorrhage)   . ED (erectile dysfunction)   . Esophageal stricture   . Esophagitis   . GERD (gastroesophageal reflux disease)   . Goals of care, counseling/discussion 12/23/2016  . Hiatal hernia   . Hypertension   . Mesothelioma ([place])   . Prostate cancer ([place])   . Stroke ([place])    hx 7 strokes  . Unspecified gastritis and gastroduodenitis without mention  of hemorrhage     PAST SURGICAL HISTORY: Past Surgical History:  Procedure Laterality Date  . ENDARTERECTOMY Left 03/05/2013   Procedure: ENDARTERECTOMY CAROTID;  Surgeon: Conrad Chetopa, MD;  Location: [facility];  Service: Vascular;  Laterality: Left;  . ESOPHAGOGASTRODUODENOSCOPY (EGD) WITH ESOPHAGEAL DILATION    . PROSTATE SURGERY     Laser surgery    FAMILY HISTORY: Family History  Problem Relation Age of Onset  . Heart disease Father        unclear specifics  . Cancer Mother   . Breast cancer Sister   . Hyperlipidemia Daughter   . Hypertension Daughter   . Hypertension Son     SOCIAL HISTORY:  Social History   Social History  . Marital status: Married    Spouse name: N/A  . Number of children: 6  . Years of education: 34   Occupational History  .      retired   Social History Main Topics  . Smoking status: Former Smoker    Packs/day: 0.50    Years: 4.00    Types: Cigarettes    Quit date: 12/21/1953  . Smokeless tobacco: Never Used  . Alcohol use No     Comment: H/O--one alcohol drink a day  . Drug use: No  . Sexual activity: Not on file   Other Topics Concern  . Not on file   Social History Narrative   03/09/17 Married 1948, living with wife and son. 6 children. 9 grandkids. 2 greatgrandkids.  (One son was killed in the [place])      [place]. [employer]- honorable discharge.       Hobbies: golfing about twice a month.      PHYSICAL EXAM  GENERAL EXAM/CONSTITUTIONAL: Vitals:  Vitals:   03/09/17 1051  BP: 116/69  Pulse: 78  Weight: 158 lb (71.7 kg)  Height: 5\' 11"  (1.803 m)     Body mass index is 22.04 kg/m.  No exam data present  Patient is in no distress; well developed, nourished and groomed; neck is supple  POOR DENTITION  CARDIOVASCULAR:  Examination of carotid arteries is normal; no carotid bruits  IRREGULAR RATE AND RHYTHM; no murmurs  Examination of peripheral vascular system by observation and  palpation is normal  EYES:  Ophthalmoscopic exam of optic discs and posterior segments is normal; no papilledema or hemorrhages  MUSCULOSKELETAL:  Gait, strength, tone, movements noted in Neurologic exam below  NEUROLOGIC: MENTAL STATUS:  MMSE - Mini Mental State Exam 11/14/2016  Orientation to time 3  Orientation to Place 5  Registration 3  Attention/ Calculation 1  Recall 0  Language- name 2 objects 2  Language- repeat 1  Language- follow 3 step command 3  Language- read & follow direction 1    awake, alert, oriented to person, place and time  recent and remote memory intact  normal attention and concentration  language fluent, comprehension intact, naming intact,   fund of knowledge appropriate  CRANIAL NERVE:   2nd -  no papilledema on fundoscopic exam  2nd, 3rd, 4th, 6th - PUPILS --> RIGHT EYE PINPOINT POST-SURG PUIL; LEFT PUPIL 2MM REACTIVE; visual fields full to confrontation, extraocular muscles intact, no nystagmus  5th - facial sensation symmetric  7th - facial strength symmetric  8th - hearing intact  9th - palate elevates symmetrically, uvula midline  11th - shoulder shrug symmetric  12th - tongue protrusion midline  MODERATE SLURRED SPEECH  MOTOR:   normal bulk and tone, full strength in the BUE, BLE  SENSORY:   normal and symmetric to light touch, temperature, vibration  EXCEPT DECR TEMP ON LEFT SIDE  COORDINATION:   finger-nose-finger, fine finger movements SLOW   REFLEXES:   deep tendon reflexes present and symmetric  GAIT/STATION:   narrow based gait; USING WALKER    DIAGNOSTIC DATA (LABS, IMAGING, TESTING) - I reviewed patient records, labs, notes, testing and imaging myself where available.  Lab Results  Component Value Date   WBC 5.2 02/21/2017   HGB 11.0 (L) 02/21/2017   HCT 34.9 (L) 02/21/2017   MCV 89.3 02/21/2017   PLT 278 02/21/2017      Component Value Date/Time   NA 141 02/21/2017 0507   NA 137  12/21/2016 1442   K 3.5 02/21/2017 0507   K 3.7 12/21/2016 1442   CL 113 (H) 02/21/2017 0507   CO2 21 (L) 02/21/2017 0507   CO2 26 12/21/2016 1442   GLUCOSE 111 (H) 02/21/2017 0507   GLUCOSE 119 12/21/2016 1442   BUN 16 02/21/2017 0507   BUN 17.1 12/21/2016 1442   CREATININE 1.13 02/21/2017 0507   CREATININE 1.2 12/21/2016 1442   CALCIUM 8.7 (L) 02/21/2017 0507   CALCIUM 9.6 12/21/2016 1442   PROT 7.8 02/15/2017 1911   PROT 7.9 12/21/2016 1442   ALBUMIN 3.4 (L) 02/15/2017 1911   ALBUMIN 3.2 (L) 12/21/2016 1442   AST 22 02/15/2017 1911   AST 13 12/21/2016 1442   ALT 14 (L) 02/15/2017 1911   ALT 20 12/21/2016 1442   ALKPHOS 80 02/15/2017 1911   ALKPHOS 97 12/21/2016 1442   BILITOT 0.8 02/15/2017 1911   BILITOT 0.51 12/21/2016 1442   GFRNONAA 56 (L) 02/21/2017 0507   GFRAA >60 02/21/2017 0507   Lab Results  Component Value Date   CHOL 134 02/16/2017   HDL 26 (L) 02/16/2017   LDLCALC 91 02/16/2017   LDLDIRECT 143 (H) 08/09/2016   TRIG 85 02/16/2017   CHOLHDL 5.2 02/16/2017   Lab Results  Component Value Date   HGBA1C 6.7 (H) 02/16/2017   Lab Results  Component Value Date   VITAMINB12 223 10/12/2011   Lab Results  Component Value Date   TSH 2.406 01/18/2017    12/13/16 CXR - Opacification of the left hemithorax secondary to the patient's known mesothelioma. This is stable from the previous exam.  02/16/17 MRI brain / MRA head  1. Continued progression of severe vertebrobasilar system stenosis and loss of basilar artery flow since the Head and neck CTA on 12/31/2016. Lack of bilateral PCA flow signal today. 2. Acute on subacute posterior circulation infarcts with fairly extensive new involvement of the right cerebellum, and multifocal new involvement of the brainstem. 3. No associated hemorrhage or mass effect. 4. The anterior circulation is stable the 12/14/2016 MRA, with up to moderate left and severe right ICA siphon stenosis.     ASSESSMENT AND PLAN  83  y.o. year old male with history of atrial fibrillation on liquids, mesothelioma with lung metastasis, prostate cancer, coronary  artery disease, dementia, hypertension, history of left carotid endarterectomy, here for evaluation of hospital discharge follow-up for stroke (July 2018).    Dx:  1. Malignant mesothelioma of pleura ([place])   2. Chronic ischemic multifocal posterior circulation stroke   3. Permanent atrial fibrillation ([place])   4. Intracranial vascular stenosis      PLAN:  - continue eliquis, aspirin, BP control, statin for stroke prevention - continue quetiapine at bedtime and haldol as needed for agitation - encouraged family to look into personal home care aid and assistance; patient would like to stay living at his home - follow up with PCP and palliative care  Return if symptoms worsen or fail to improve, for return to PCP.    Penni Bombard, MD 8/56/3149, 70:26 AM Certified in Neurology, Neurophysiology and Neuroimaging  [facility] [address] [place], [place] [address] [phone]

## 2017-03-09 NOTE — Telephone Encounter (Signed)
Noted thanks °

## 2017-03-09 NOTE — Telephone Encounter (Signed)
Tashira with [facility] states pt has also missed his appt to day for home health nursing, at the pt's request. Rescheduled for next week

## 2017-03-09 NOTE — Telephone Encounter (Signed)
Noted. Will notify Dr. Yong Channel

## 2017-03-15 ENCOUNTER — Telehealth: Payer: Self-pay | Admitting: Family Medicine

## 2017-03-15 NOTE — Telephone Encounter (Signed)
Torchera from [facility] called stating the pt had a fall today at home in his bedroom w/out any injuries is what she state that the family relayed to her.  And pt has an appointment on 03/16/17

## 2017-03-15 NOTE — Telephone Encounter (Signed)
Please note

## 2017-03-16 ENCOUNTER — Encounter: Payer: Self-pay | Admitting: Adult Health

## 2017-03-16 ENCOUNTER — Telehealth: Payer: Self-pay | Admitting: Family Medicine

## 2017-03-16 ENCOUNTER — Ambulatory Visit (INDEPENDENT_AMBULATORY_CARE_PROVIDER_SITE_OTHER): Payer: Medicare Other | Admitting: Adult Health

## 2017-03-16 VITALS — BP 130/84 | Temp 98.2°F | Wt 156.0 lb

## 2017-03-16 DIAGNOSIS — R42 Dizziness and giddiness: Secondary | ICD-10-CM | POA: Diagnosis not present

## 2017-03-16 DIAGNOSIS — R350 Frequency of micturition: Secondary | ICD-10-CM | POA: Diagnosis not present

## 2017-03-16 NOTE — Patient Instructions (Signed)
Please get up more slowly from bed. Your blood pressure is dropping when you get up fast, which is causing your dizziness. Also, please let physical therapy know about your falls

## 2017-03-16 NOTE — Telephone Encounter (Signed)
Spoke with Alla Feeling and provided verbal order for ST as requested

## 2017-03-16 NOTE — Progress Notes (Signed)
Subjective:    Patient ID: Thomas [name], male    DOB: February 07, 1926, 83 y.o.   MRN: [id number]  HPI  83 year old male who  has a past medical history of Adenomatous colon polyp (2009); Allergy; Atrial fibrillation ([place]); Carotid artery occlusion; Diabetes mellitus without complication ([place]); Diverticulosis of colon (without mention of hemorrhage); ED (erectile dysfunction); Esophageal stricture; Esophagitis; GERD (gastroesophageal reflux disease); Goals of care, counseling/discussion (12/23/2016); Hiatal hernia; Hypertension; Mesothelioma [facility]); Prostate cancer ([place]); Stroke [facility]); and Unspecified gastritis and gastroduodenitis without mention of hemorrhage. He is a patient of Dr. Yong [name] who I am seeing today for an acute issue. His family is with him at this visit to help provide history.   Thomas [name] reports that he has had two falls in the last week. He reports that he has felt dizzy both times when getting up and then falls when he gets out of bed. He denies hitting his head and has not had any blurred vision or headaches. Family reports small bruises on mid back. He is currently completing physical therapy s/p stroke  He also complains of urinary frequency and urinary pressure for an undetermined amount of time. Denies any fevers, dysuria or hematuria   Review of Systems See HPI   Past Medical History:  Diagnosis Date  . Adenomatous colon polyp 2009  . Allergy   . Atrial fibrillation ([place])   . Carotid artery occlusion   . Diabetes mellitus without complication ([place])   . Diverticulosis of colon (without mention of hemorrhage)   . ED (erectile dysfunction)   . Esophageal stricture   . Esophagitis   . GERD (gastroesophageal reflux disease)   . Goals of care, counseling/discussion 12/23/2016  . Hiatal hernia   . Hypertension   . Mesothelioma ([place])   . Prostate cancer ([place])   . Stroke ([place])    hx 7 strokes  . Unspecified gastritis and gastroduodenitis without mention of hemorrhage      Social History   Social History  . Marital status: Married    Spouse name: N/A  . Number of children: 6  . Years of education: 19   Occupational History  .      retired   Social History Main Topics  . Smoking status: Former Smoker    Packs/day: 0.50    Years: 4.00    Types: Cigarettes    Quit date: 12/21/1953  . Smokeless tobacco: Never Used  . Alcohol use No     Comment: H/O--one alcohol drink a day  . Drug use: No  . Sexual activity: Not on file   Other Topics Concern  . Not on file   Social History Narrative   03/09/17 Married 1948, living with wife and son. 6 children. 9 grandkids. 2 greatgrandkids.  (One son was killed in the [place])      [place]. [employer]- honorable discharge.       Hobbies: golfing about twice a month.     Past Surgical History:  Procedure Laterality Date  . ENDARTERECTOMY Left 03/05/2013   Procedure: ENDARTERECTOMY CAROTID;  Surgeon: Conrad Nodaway, MD;  Location: [facility];  Service: Vascular;  Laterality: Left;  . ESOPHAGOGASTRODUODENOSCOPY (EGD) WITH ESOPHAGEAL DILATION    . PROSTATE SURGERY     Laser surgery    Family History  Problem Relation Age of Onset  . Heart disease Father        unclear specifics  . Cancer Mother   . Breast cancer Sister   .  Hyperlipidemia Daughter   . Hypertension Daughter   . Hypertension Son     Allergies  Allergen Reactions  . Penicillins Anaphylaxis and Swelling    THROAT SWELLS Has patient had a PCN reaction causing immediate rash, facial/tongue/throat swelling, SOB or lightheadedness with hypotension: Yes Has patient had a PCN reaction causing severe rash involving mucus membranes or skin necrosis: No Has patient had a PCN reaction that required hospitalization: No Has patient had a PCN reaction occurring within the last 10 years: No If all of the above answers are "NO", then may proceed with Cephalosporin use.   Thomas [name] Inhibitors Hives    Current Outpatient  Prescriptions on File Prior to Visit  Medication Sig Dispense Refill  . amLODipine (NORVASC) 5 MG tablet Take 1 tablet (5 mg total) by mouth daily. 30 tablet 3  . apixaban (ELIQUIS) 5 MG TABS tablet Take 5 mg by mouth 2 (two) times daily.    Marland Kitchen aspirin EC 81 MG EC tablet Take 1 tablet (81 mg total) by mouth daily. (Patient taking differently: Take 81 mg by mouth daily as needed (for chest pain). )    . cloNIDine (CATAPRES) 0.1 MG tablet Take 1 tablet (0.1 mg total) by mouth 2 (two) times daily. 90 tablet 4  . haloperidol (HALDOL) 1 MG tablet Take 1 mg by mouth once as needed for agitation.    . QUEtiapine (SEROQUEL) 25 MG tablet Take 1 tablet (25 mg total) by mouth at bedtime. 30 tablet 2  . rosuvastatin (CRESTOR) 20 MG tablet Take 1 tablet (20 mg total) by mouth daily.    Marland Kitchen senna-docusate (SENOKOT-S) 8.6-50 MG tablet Take 1 tablet by mouth at bedtime as needed for mild constipation.    . traZODone (DESYREL) 50 MG tablet Take 1 tablet (50 mg total) by mouth at bedtime. 30 tablet 0   No current facility-administered medications on file prior to visit.     BP 130/84 (BP Location: Right Arm)   Temp 98.2 F (36.8 C) (Oral)   Wt 156 lb (70.8 kg)   BMI 21.76 kg/m       Objective:   Physical Exam  Constitutional: He is oriented to person, place, and time. He appears well-developed and well-nourished. No distress.  Cardiovascular: Normal rate, regular rhythm, normal heart sounds and intact distal pulses.  Exam reveals no gallop and no friction rub.   No murmur heard. Pulmonary/Chest: Effort normal and breath sounds normal. No respiratory distress. He has no wheezes. He has no rales. He exhibits no tenderness.  Neurological: He is alert and oriented to person, place, and time.  Skin: Skin is warm and dry. No rash noted. He is not diaphoretic. No erythema. No pallor.  Two small bruises in various stages of healing in bilateral sides of mid back   Psychiatric: He has a normal mood and affect.  His behavior is normal. Judgment and thought content normal.  Nursing note and vitals reviewed.     Assessment & Plan:  1. Dizziness - Likely due to orthostatics  - Advised to get up more slowly when he is in bed or lying down  - Let PT know about falls  - Follow up with Dr. Yong [name] if falls continue  2. Urinary frequency He is unable to urinate in the office today. Sterile cup given and his family will return sample on Monday  - POCT Urinalysis Dipstick (Automated)- Future  Dorothyann Peng, NP

## 2017-03-16 NOTE — Telephone Encounter (Signed)
Pts daughter is calling stating that they are needing a form filled out by Dr. Yong Channel for the pt to go to [facility] so the family can get some assistance with pt.  Daughter Ivin Booty) would like to have a call concerning if there maybe a charge associated to fill it out the form.

## 2017-03-16 NOTE — Telephone Encounter (Signed)
Minda with [facility] would like a verbal for ST  2 wk /2  For dysphasia, cognitive communication deficit related to dementia.  After this pt will be due for recert

## 2017-03-19 ENCOUNTER — Encounter: Payer: Self-pay | Admitting: Family Medicine

## 2017-03-20 ENCOUNTER — Telehealth: Payer: Self-pay | Admitting: Family Medicine

## 2017-03-20 NOTE — Telephone Encounter (Signed)
May increase seroquel to 50mg . Would also be worth having palliative care come back out- if they are agreeable to discuss other options. Any further increase on seroquel or other meds will need office visit

## 2017-03-20 NOTE — Telephone Encounter (Signed)
Daughter calling pt get some sort of medicine to help him sleep at night, and help with agitation during the day.  She states when the pt goes to bed , he is up every 30 min to an hour. They family is taking turns staying there ,and no one gets any sleep.  Pt gets a little agitated during the day, not a lot , but some.   Pt saw Tommi Rumps last week concerning a fall.  Family asked at that time for something to help sleep, and he stated must come from Dr Yong Channel.  They are hoping they will not have to bring pt in again.   [place], [place]

## 2017-03-20 NOTE — Telephone Encounter (Signed)
Pt's son, Kalden Wanke,  following up on this med request.  Madaline Brilliant to call him at [phone]

## 2017-03-20 NOTE — Telephone Encounter (Signed)
Daughter states this is a daycare, 8:30 - 5:30 .they are attempting to keep pt at home as long as they can.

## 2017-03-21 NOTE — Telephone Encounter (Signed)
Spoke with daughter Ivin Booty who verbalized understanding. They will increase to 50 mg. I also informed her that we faxed the paperwork back yesterday for him to participate in the day program. She verbalized understanding

## 2017-03-21 NOTE — Telephone Encounter (Signed)
Form was completed and faxed to facility yesterday. I spoke with Ivin Booty earlier today and made her aware.

## 2017-03-23 ENCOUNTER — Telehealth: Payer: Self-pay | Admitting: Family Medicine

## 2017-03-23 NOTE — Telephone Encounter (Signed)
Pt daughter dropped off for pt to be able to start adult daycare at [place] on Wednesday 03/28/17. Please fax completed form to [phone].  Call pt daughter Patriciaann Clan [phone] to confirm it has been faxed.

## 2017-03-23 NOTE — Telephone Encounter (Signed)
Please see phone note dated 03/20/17. Paperwork was complete, faxed to facility, and daughter was notified

## 2017-03-28 ENCOUNTER — Telehealth: Payer: Self-pay | Admitting: Family Medicine

## 2017-03-28 NOTE — Telephone Encounter (Addendum)
Bailey Mech would like verbal orders for PT for this patient  twice a wk for 1 wk starting next  wk

## 2017-03-28 NOTE — Telephone Encounter (Signed)
Called and provided verbal order as requested 

## 2017-03-30 ENCOUNTER — Telehealth: Payer: Self-pay | Admitting: Pulmonary Disease

## 2017-03-30 ENCOUNTER — Other Ambulatory Visit: Payer: Self-pay

## 2017-03-30 MED ORDER — APIXABAN 5 MG PO TABS: 5.0000 mg | ORAL_TABLET | Freq: Two times a day (BID) | ORAL | 5 refills | Status: AC

## 2017-03-30 MED ORDER — AMLODIPINE BESYLATE 5 MG PO TABS: 5.0000 mg | ORAL_TABLET | Freq: Every day | ORAL | 5 refills | Status: DC

## 2017-03-30 MED ORDER — AZITHROMYCIN 250 MG PO TABS: ORAL_TABLET | ORAL | 0 refills | Status: DC

## 2017-03-30 MED ORDER — HALOPERIDOL 1 MG PO TABS: 1.0000 mg | ORAL_TABLET | Freq: Once | ORAL | 1 refills | Status: DC | PRN

## 2017-03-30 MED ORDER — TRAZODONE HCL 50 MG PO TABS: 50.0000 mg | ORAL_TABLET | Freq: Every day | ORAL | 5 refills | Status: DC

## 2017-03-30 NOTE — Telephone Encounter (Signed)
z-pak 

## 2017-03-30 NOTE — Telephone Encounter (Signed)
Called and spoke with pt's daughter Thomas [name]. Thomas [name] states pt has prod cough with clear to yellowish mucus, nasal drainage clear in color & increased sob with exertion x2w Denies fever, chills or sweats.  Thomas [name] is concerned about pt's symptoms, due to pt's hx of mesothelioma of pleura.   RA please advise. Thanks.

## 2017-03-30 NOTE — Telephone Encounter (Signed)
Spoke with patient's daughter. She is aware of RA's recs. RX has been sent to his pharmacy. Nothing else needed at time of call.

## 2017-04-02 ENCOUNTER — Telehealth: Payer: Self-pay | Admitting: Family Medicine

## 2017-04-02 ENCOUNTER — Encounter: Payer: Self-pay | Admitting: Family Medicine

## 2017-04-02 ENCOUNTER — Telehealth: Payer: Self-pay

## 2017-04-02 NOTE — Telephone Encounter (Signed)
Scott with [employer] stating the that pt PA for haloperidol was approved today for a year.

## 2017-04-02 NOTE — Telephone Encounter (Signed)
Received PA request for Thomas [name]. PA submitted & is pending. Key: [id number]

## 2017-04-03 NOTE — Telephone Encounter (Signed)
PA approved, form faxed back to pharmacy. 

## 2017-04-04 ENCOUNTER — Telehealth: Payer: Self-pay

## 2017-04-04 ENCOUNTER — Ambulatory Visit (INDEPENDENT_AMBULATORY_CARE_PROVIDER_SITE_OTHER): Payer: Medicare Other | Admitting: Family Medicine

## 2017-04-04 ENCOUNTER — Encounter: Payer: Self-pay | Admitting: Family Medicine

## 2017-04-04 VITALS — BP 142/102 | HR 78 | Temp 98.3°F | Ht 70.0 in | Wt 160.0 lb

## 2017-04-04 DIAGNOSIS — I10 Essential (primary) hypertension: Secondary | ICD-10-CM | POA: Diagnosis not present

## 2017-04-04 NOTE — Telephone Encounter (Signed)
Spoke with pt's daughter, Ivin Booty, and she states that pt's adult daycare facility called to report that he has had elevated BP today. They took it at 1:20pm and it was 175/103 L arm, 175/108 R arm. They waited 2mins and rechecked, it was 170/100. She states that they have not noticed any s/s related to stroke.   Spoke with Dr. Yong Channel and he advised that pt needs to be seen in office today. Pt scheduled with Dr. Volanda Napoleon. Daughter aware.

## 2017-04-04 NOTE — Progress Notes (Signed)
Subjective:    Patient ID: Thomas [name], male    DOB: 1926-02-10, 82 y.o.   MRN: [id number]  Chief Complaint  Patient presents with  . Acute Visit    HPI Patient was seen today for acute complaint.  Accompanied by his daughter.  Elevated bp: -pt with dx of HTN.  On clonidine 1 pill in am and 2 pills in pm, Norvasc 5 mg. -Pt was at a day facility where bp was elevated.  At 1:20 pm 175/103 in L arm pulse 68,  175/108 p 79 in R arm. -Repeat bp at 2:10pm 160/90,  2:17pm 160/88, and at 2:35pm 140/88. -Given recent CVA, pt was advised to come to PCP for further eval. -Pt endorses taking his meds this am. -Pt denies HA, dizziness, N/V, abd pain, changes in urine production.  States "today is one of my better days".  Pt does endorse stress as his wife is currently in the hospital.  They have been married for over 6 yrs.  Past Medical History:  Diagnosis Date  . Adenomatous colon polyp 2009  . Allergy   . Atrial fibrillation ([place])   . Carotid artery occlusion   . Diabetes mellitus without complication ([place])   . Diverticulosis of colon (without mention of hemorrhage)   . ED (erectile dysfunction)   . Esophageal stricture   . Esophagitis   . GERD (gastroesophageal reflux disease)   . Goals of care, counseling/discussion 12/23/2016  . Hiatal hernia   . Hypertension   . Mesothelioma ([place])   . Prostate cancer ([place])   . Stroke ([place])    hx 7 strokes  . Unspecified gastritis and gastroduodenitis without mention of hemorrhage     Past Surgical History:  Procedure Laterality Date  . ENDARTERECTOMY Left 03/05/2013   Procedure: ENDARTERECTOMY CAROTID;  Surgeon: Conrad [place], MD;  Location: [facility];  Service: Vascular;  Laterality: Left;  . ESOPHAGOGASTRODUODENOSCOPY (EGD) WITH ESOPHAGEAL DILATION    . PROSTATE SURGERY     Laser surgery    Family History  Problem Relation Age of Onset  . Heart disease Father        unclear specifics  . Cancer Mother   . Breast cancer Sister   .  Hyperlipidemia Daughter   . Hypertension Daughter   . Hypertension Son     Social History   Social History  . Marital status: Married    Spouse name: N/A  . Number of children: 6  . Years of education: 74   Occupational History  .      retired   Social History Main Topics  . Smoking status: Former Smoker    Packs/day: 0.50    Years: 4.00    Types: Cigarettes    Quit date: 12/21/1953  . Smokeless tobacco: Never Used  . Alcohol use No     Comment: H/O--one alcohol drink a day  . Drug use: No  . Sexual activity: Not on file   Other Topics Concern  . Not on file   Social History Narrative   03/09/17 Married 1948, living with wife and son. 6 children. 9 grandkids. 2 greatgrandkids.  (One son was killed in the [place])      [place]. [employer]- honorable discharge.       Hobbies: golfing about twice a month.     Outpatient Medications Prior to Visit  Medication Sig Dispense Refill  . amLODipine (NORVASC) 5 MG tablet Take 1 tablet (5 mg total) by mouth daily. [place]  tablet 5  . apixaban (ELIQUIS) 5 MG TABS tablet Take 1 tablet (5 mg total) by mouth 2 (two) times daily. 60 tablet 5  . aspirin EC 81 MG EC tablet Take 1 tablet (81 mg total) by mouth daily. (Patient taking differently: Take 81 mg by mouth daily as needed (for chest pain). )    . azithromycin (ZITHROMAX) 250 MG tablet Take 2 tablets for first day, then 1 tablet once a day until finished. 6 tablet 0  . cloNIDine (CATAPRES) 0.1 MG tablet Take 1 tablet (0.1 mg total) by mouth 2 (two) times daily. 90 tablet 4  . haloperidol (HALDOL) 1 MG tablet Take 1 tablet (1 mg total) by mouth once as needed for agitation. 30 tablet 1  . QUEtiapine (SEROQUEL) 25 MG tablet Take 1 tablet (25 mg total) by mouth at bedtime. 30 tablet 2  . rosuvastatin (CRESTOR) 20 MG tablet Take 1 tablet (20 mg total) by mouth daily.    Marland Kitchen senna-docusate (SENOKOT-S) 8.6-50 MG tablet Take 1 tablet by mouth at bedtime as needed  for mild constipation.    . traZODone (DESYREL) 50 MG tablet Take 1 tablet (50 mg total) by mouth at bedtime. 30 tablet 5   No facility-administered medications prior to visit.     Allergies  Allergen Reactions  . Penicillins Anaphylaxis and Swelling    THROAT SWELLS Has patient had a PCN reaction causing immediate rash, facial/tongue/throat swelling, SOB or lightheadedness with hypotension: Yes Has patient had a PCN reaction causing severe rash involving mucus membranes or skin necrosis: No Has patient had a PCN reaction that required hospitalization: No Has patient had a PCN reaction occurring within the last 10 years: No If all of the above answers are "NO", then may proceed with Cephalosporin use.   . Ace Inhibitors Hives    ROS  General: Denies fever, chills, night sweats, changes in weight, changes in appetite  +elevated bp HEENT: Denies headaches, ear pain, changes in vision, rhinorrhea, sore throat CV: Denies CP, palpitations, SOB, orthopnea Pulm: Denies SOB, cough, wheezing GI: Denies abdominal pain, nausea, vomiting, diarrhea, constipation GU: Denies dysuria, hematuria, frequency, vaginal discharge Msk: Denies muscle cramps, joint pains Neuro: Denies weakness, numbness, tingling Skin: Denies rashes, bruising Psych: Denies depression, anxiety, hallucinations      Objective:    Blood pressure (!) 142/102, pulse 78, temperature 98.3 F (36.8 C), temperature source Oral, height 5\' 10"  (1.778 m), weight 160 lb (72.6 kg), SpO2 99 %.   Gen. Pleasant, elderly male, in no distress, normal affect  HEENT: [place]/AT, face symmetric, conjunctiva clear, no scleral icterus, EOMI, nares patent without drainage, poor dentition, pharynx without erythema or exudate. Lungs: no accessory muscle uss, CTAB, no wheezes or rales Cardiovascular: RRR, heart sounds  normal, no m/r/g Abdomen: soft and non-tender, no hepatosplenomegaly, BS normal. Musculoskeletal: No deformities, no cyanosis or  clubbing, normal tone, strength in UEs 4/5 b/l Neuro:  A&Ox3, CN II-XII grossly intact, normal gait Skin:  Warm, no lesions/ rash   Wt Readings from Last 3 Encounters:  04/04/17 160 lb (72.6 kg)  03/16/17 156 lb (70.8 kg)  03/09/17 158 lb (71.7 kg)    Diabetic Foot Exam - Simple   No data filed     Lab Results  Component Value Date   WBC 5.2 02/21/2017   HGB 11.0 (L) 02/21/2017   HCT 34.9 (L) 02/21/2017   PLT 278 02/21/2017   GLUCOSE 111 (H) 02/21/2017   CHOL 134 02/16/2017   TRIG 85 02/16/2017  HDL 26 (L) 02/16/2017   LDLDIRECT 143 (H) 08/09/2016   LDLCALC 91 02/16/2017   ALT 14 (L) 02/15/2017   AST 22 02/15/2017   NA 141 02/21/2017   K 3.5 02/21/2017   CL 113 (H) 02/21/2017   CREATININE 1.13 02/21/2017   BUN 16 02/21/2017   CO2 21 (L) 02/21/2017   TSH 2.406 01/18/2017   PSA 4.16 (H) 05/18/2015   INR 1.35 02/15/2017   HGBA1C 6.7 (H) 02/16/2017    Assessment/Plan:  Elevated blood pressure reading with diagnosis of hypertension -BP initially 142/102.  138/79 on recheck. -Reassured pt and daughter.  Discussed increased stress can affect bp. -Will continue current bp meds (Norvasc 5 mg and Clonidine) -given RTC precautions.  Follow up with PCP in next 2 wks, sooner prn.

## 2017-04-04 NOTE — Patient Instructions (Signed)
Today you were seen for elevated blood pressure.  Since being seen in clinic your blood pressure has come down.  You can continue to take your blood pressure medicine as you have been.  No changes were made to your medicines.  You can follow up with your PCP in the next few weeks.   Today we also called the pharmacy to check on your prescription for Haldol.  It should be there and ready for pick up.

## 2017-04-09 ENCOUNTER — Telehealth: Payer: Self-pay | Admitting: Pulmonary Disease

## 2017-04-09 ENCOUNTER — Encounter: Payer: Self-pay | Admitting: Adult Health

## 2017-04-09 ENCOUNTER — Ambulatory Visit: Payer: Medicare Other | Admitting: Adult Health

## 2017-04-09 ENCOUNTER — Telehealth: Payer: Self-pay | Admitting: Family Medicine

## 2017-04-09 ENCOUNTER — Ambulatory Visit (INDEPENDENT_AMBULATORY_CARE_PROVIDER_SITE_OTHER)
Admission: RE | Admit: 2017-04-09 | Discharge: 2017-04-09 | Disposition: A | Discharge status: Home / Self Care | Payer: Medicare Other | Source: Ambulatory Visit | Attending: Adult Health | Admitting: Adult Health

## 2017-04-09 ENCOUNTER — Ambulatory Visit (INDEPENDENT_AMBULATORY_CARE_PROVIDER_SITE_OTHER): Payer: Medicare Other | Admitting: Adult Health

## 2017-04-09 VITALS — BP 106/64 | HR 74 | Ht 70.0 in | Wt 159.2 lb

## 2017-04-09 DIAGNOSIS — J90 Pleural effusion, not elsewhere classified: Secondary | ICD-10-CM

## 2017-04-09 DIAGNOSIS — J40 Bronchitis, not specified as acute or chronic: Secondary | ICD-10-CM | POA: Diagnosis not present

## 2017-04-09 DIAGNOSIS — C450 Mesothelioma of pleura: Secondary | ICD-10-CM | POA: Diagnosis not present

## 2017-04-09 MED ORDER — DOXYCYCLINE HYCLATE 100 MG PO TABS: 100.0000 mg | ORAL_TABLET | Freq: Two times a day (BID) | ORAL | 0 refills | Status: DC

## 2017-04-09 NOTE — Telephone Encounter (Signed)
Verbal order provided as requested 

## 2017-04-09 NOTE — Assessment & Plan Note (Signed)
CXR shows stable complete opacification of left hemithorax. Does not wish to pursue further evaluation /tx.

## 2017-04-09 NOTE — Telephone Encounter (Addendum)
Pt daughter Freda Munro called in and is asking for recommendations again. Advised that we have not heard from Dr Elsworth Soho yet and he will be paged again. Freda Munro states that Dr Elsworth Soho told them to call anytime anything new pops up with his breathing. Freda Munro states that they are not taking the patient the ED.   Please advise Dr Elsworth Soho. Thanks.

## 2017-04-09 NOTE — Telephone Encounter (Signed)
Will need ED evaluation May be time to consider palliative care since cancer is advanced

## 2017-04-09 NOTE — Telephone Encounter (Signed)
° °  Nicolette with [facility] call to ask for verbal orders to continue for home health pt for 2 times a week for 4 weeks    [phone]

## 2017-04-09 NOTE — Assessment & Plan Note (Signed)
Slow to resolve bronchitis  cxr w/ no acute changes   Plan  . Patient Instructions  Begin Mucinex DM Twice daily  As needed  Cough/congestion  . Doxycycline 100mg  Twice daily  For 1 week, take w/ food.  Please contact office for sooner follow up if symptoms do not improve or worsen or seek emergency care ] Follow up with Dr. Elsworth Soho  In 2 months and As needed

## 2017-04-09 NOTE — Patient Instructions (Addendum)
Begin Mucinex DM Twice daily  As needed  Cough/congestion  . Doxycycline 100mg  Twice daily  For 1 week, take w/ food.  Please contact office for sooner follow up if symptoms do not improve or worsen or seek emergency care ] Follow up with Dr. Elsworth Soho  In 2 months and As needed

## 2017-04-09 NOTE — Progress Notes (Signed)
@Patient  ID: Thomas [name], male    DOB: 04/23/1926, 83 y.o.   MRN: [id number]  No chief complaint on file.   Referring provider: Marin Olp, MD  HPI: 83 year old male never smoker followed for malignant mesothelioma Veteran in the [employer] including Nadine on Eliquis   Significant tests/ events  - L tcentesis 02/21/2016 : 1.5 liters of hazy, yellow fluid >Exudate/ wbc 1655 L >>P with cyt atypical worrisome for adenoca  10/20/16 1.3 L thora  10/27/16 CT chest showed large left effusion with thickened pleura in atelectasis with left lung  Pleura biopsy 11/2016 MALIGNANT MESOTHELIOMA, EPITHELIOID  04/09/2017 Acute OV : Cough  Patient presents for an acute office visit. He is accompanied by his sons. Patient says that the last 2 weeks , he's had increased cough with thick, congestion, shortness of breath, pleuritic type pain and sensitivity along the left rib cage. Took Zpack with no help. He denies any fever, hemoptysis, chest pain, orthopnea, PND or leg sign. Patient has a good appetite with no nausea, vomiting or diarrhea. Patient is on no oxygen at home. O2 saturations were 98%. On room air. Chest xray today shows Stable complete opacification LEFT hemithorax. RIGHT lung is clear. He is accompanied by his sons. They says he is aware of the mesothelioma but does wish to pursue any treatment.    Allergies  Allergen Reactions  . Penicillins Anaphylaxis and Swelling    THROAT SWELLS Has patient had a PCN reaction causing immediate rash, facial/tongue/throat swelling, SOB or lightheadedness with hypotension: Yes Has patient had a PCN reaction causing severe rash involving mucus membranes or skin necrosis: No Has patient had a PCN reaction that required hospitalization: No Has patient had a PCN reaction occurring within the last 10 years: No If all of the above answers are "NO", then may proceed with Cephalosporin use.   . Ace Inhibitors Hives     Immunization History  Administered Date(s) Administered  . Influenza Split 04/27/2011, 04/29/2012  . Influenza Whole 08/14/2004, 06/05/2007, 05/15/2008, 05/26/2009, 04/20/2010  . Influenza, High Dose Seasonal PF 05/29/2013, 06/02/2014, 05/18/2015  . Influenza-Unspecified 04/29/2016  . Pneumococcal Conjugate-13 05/18/2015  . Pneumococcal Polysaccharide-23 08/14/2002, 03/26/2008  . Td 08/14/1998, 04/09/2009    Past Medical History:  Diagnosis Date  . Adenomatous colon polyp 2009  . Allergy   . Atrial fibrillation ([place])   . Carotid artery occlusion   . Diabetes mellitus without complication ([place])   . Diverticulosis of colon (without mention of hemorrhage)   . ED (erectile dysfunction)   . Esophageal stricture   . Esophagitis   . GERD (gastroesophageal reflux disease)   . Goals of care, counseling/discussion 12/23/2016  . Hiatal hernia   . Hypertension   . Mesothelioma ([place])   . Prostate cancer ([place])   . Stroke ([place])    hx 7 strokes  . Unspecified gastritis and gastroduodenitis without mention of hemorrhage     Tobacco History: History  Smoking Status  . Former Smoker  . Packs/day: 0.50  . Years: 4.00  . Types: Cigarettes  . Quit date: 12/21/1953  Smokeless Tobacco  . Never Used   Counseling given: Not Answered   Outpatient Encounter Prescriptions as of 04/09/2017  Medication Sig  . amLODipine (NORVASC) 5 MG tablet Take 1 tablet (5 mg total) by mouth daily.  Marland Kitchen apixaban (ELIQUIS) 5 MG TABS tablet Take 1 tablet (5 mg total) by mouth 2 (two) times daily.  Marland Kitchen aspirin EC 81 MG EC tablet Take 1  tablet (81 mg total) by mouth daily. (Patient taking differently: Take 81 mg by mouth daily as needed (for chest pain). )  . cloNIDine (CATAPRES) 0.1 MG tablet Take 1 tablet (0.1 mg total) by mouth 2 (two) times daily.  . haloperidol (HALDOL) 1 MG tablet Take 1 tablet (1 mg total) by mouth once as needed for agitation.  . QUEtiapine (SEROQUEL) 25 MG tablet Take 1 tablet (25 mg  total) by mouth at bedtime.  . rosuvastatin (CRESTOR) 20 MG tablet Take 1 tablet (20 mg total) by mouth daily.  Marland Kitchen senna-docusate (SENOKOT-S) 8.6-50 MG tablet Take 1 tablet by mouth at bedtime as needed for mild constipation.  . traZODone (DESYREL) 50 MG tablet Take 1 tablet (50 mg total) by mouth at bedtime.  Marland Kitchen doxycycline (VIBRA-TABS) 100 MG tablet Take 1 tablet (100 mg total) by mouth 2 (two) times daily.  . [DISCONTINUED] azithromycin (ZITHROMAX) 250 MG tablet Take 2 tablets for first day, then 1 tablet once a day until finished. (Patient not taking: Reported on 04/09/2017)   No facility-administered encounter medications on file as of 04/09/2017.      Review of Systems  Constitutional:   No  weight loss, night sweats,  Fevers, chills,  +fatigue, or  lassitude.  HEENT:   No headaches,  Difficulty swallowing,  Tooth/dental problems, or  Sore throat,                No sneezing, itching, ear ache, nasal congestion, post nasal drip,   CV:  No chest pain,  Orthopnea, PND, swelling in lower extremities, anasarca, dizziness, palpitations, syncope.   GI  No heartburn, indigestion, abdominal pain, nausea, vomiting, diarrhea, change in bowel habits, loss of appetite, bloody stools.   Resp:   No chest wall deformity  Skin: no rash or lesions.  GU: no dysuria, change in color of urine, no urgency or frequency.  No flank pain, no hematuria   MS:  No joint pain or swelling.  No decreased range of motion.  No back pain.    Physical Exam  BP 106/64 (BP Location: Left Arm, Cuff Size: Normal)   Pulse 74   Ht 5\' 10"  (1.778 m)   Wt 159 lb 3.2 oz (72.2 kg)   SpO2 100%   BMI 22.84 kg/m   GEN: A/Ox3; pleasant , NAD, eldelry in wc    HEENT:  [place]/AT,  EACs-clear, TMs-wnl, NOSE-clear, THROAT-clear, no lesions, no postnasal drip or exudate noted.   NECK:  Supple w/ fair ROM; no JVD; normal carotid impulses w/o bruits; no thyromegaly or nodules palpated; no lymphadenopathy.    RESP  Clear  P &  A; w/o, wheezes/ rales/ or rhonchi. no accessory muscle use, no dullness to percussion  CARD:  RRR, no m/r/g, no peripheral edema, pulses intact, no cyanosis or clubbing.  GI:   Soft & nt; nml bowel sounds; no organomegaly or masses detected.   Musco: Warm bil, no deformities or joint swelling noted.   Neuro: alert, no focal deficits noted.    Skin: Warm, no lesions or rashes    Lab Results:  CBC    BNP No results found for: BNP  ProBNP    Component Value Date/Time   PROBNP 69.0 02/16/2016 1003    Imaging: Dg Chest 2 View  Result Date: 04/09/2017 CLINICAL DATA:  LEFT flank pain today. EXAM: CHEST  2 VIEW COMPARISON:  02/16/2017. FINDINGS: Stable complete opacification LEFT hemithorax. RIGHT lung is clear. Cardiomegaly is suspected. Overlying telemetry device has been  removed. No osseous findings. IMPRESSION: Stable exam. Electronically Signed   By: Staci Righter M.D.   On: 04/09/2017 15:35     Assessment & Plan:   Malignant mesothelioma of pleura (HCC) CXR shows stable complete opacification of left hemithorax. Does not wish to pursue further evaluation /tx.   Bronchitis Slow to resolve bronchitis  cxr w/ no acute changes   Plan  . Patient Instructions  Begin Mucinex DM Twice daily  As needed  Cough/congestion  . Doxycycline 100mg  Twice daily  For 1 week, take w/ food.  Please contact office for sooner follow up if symptoms do not improve or worsen or seek emergency care ] Follow up with Dr. Elsworth Soho  In 2 months and As needed         Rexene Edison, NP 04/09/2017

## 2017-04-09 NOTE — Telephone Encounter (Addendum)
Pt having left sided chest pain/discomfort and puffiness in chest.  Skin color is normal, compared to Right.  2-3 inch difference between the left and right side (swelling) - patient son states area is bulging Son notes that the swelling is external and can be seen.  Side is very tender and unable to be touched.  Denies increased cough, wheezing, chest congestion, fullness in chest and SOB. No mucus production. Pt has not fallen or hit his side that he recalls.  Zpak given 03/30/17 for flare - pt has hx of Mesothelioma Pt Son is concerned of possible fluid on lungs??  Dr Elsworth Soho please advise if you feel patient should be evaluated by ED or our office given his symptoms.

## 2017-04-09 NOTE — Telephone Encounter (Signed)
Spoke with Thomas [name] and advised her of RA's recs. She stated that the ED is not an option since the last time her father stayed there for over 8 hours and his BP became extremely high due to waiting.   Per Ashtyn, she spoke with TP this morning about this patient. She is was ok to use one of her held spots as a last resort. Patient has been scheduled with TP this afternoon at 245. Thomas [name] verbalized understanding. Nothing else was needed at time of call.

## 2017-04-10 ENCOUNTER — Telehealth: Payer: Self-pay | Admitting: Family Medicine

## 2017-04-10 NOTE — Telephone Encounter (Signed)
Left verbal on Minda's voicemail and advised to call back with any questions.

## 2017-04-10 NOTE — Telephone Encounter (Signed)
Okay for verbal 

## 2017-04-10 NOTE — Telephone Encounter (Signed)
Verbal order to continue Speech Therapy

## 2017-04-10 NOTE — Telephone Encounter (Signed)
Yes thanks 

## 2017-04-12 ENCOUNTER — Ambulatory Visit: Payer: Medicare Other | Admitting: Adult Health

## 2017-04-12 NOTE — Progress Notes (Signed)
Reviewed & agree with plan  

## 2017-04-15 ENCOUNTER — Other Ambulatory Visit: Payer: Self-pay | Admitting: Adult Health

## 2017-05-01 ENCOUNTER — Telehealth: Payer: Self-pay

## 2017-05-01 DIAGNOSIS — R1310 Dysphagia, unspecified: Secondary | ICD-10-CM

## 2017-05-01 NOTE — Telephone Encounter (Signed)
Minda @ [facility] states that she did recent visit with the patient and he was having increased difficulty swallowing. She is concerned about his aspiration risk. She would like approval for 4 additional visits and is also requesting pt have a referral for a  modified barium swallow.  Dr. Yong Channel - Please advise. Thanks!

## 2017-05-01 NOTE — Telephone Encounter (Signed)
Yes thanks, may give orders- these are all through home health it would seem?

## 2017-05-03 NOTE — Telephone Encounter (Signed)
Verbal orders called to [facility] w/ [facility].  Orders placed for MBS.

## 2017-05-04 ENCOUNTER — Encounter: Payer: Self-pay | Admitting: Family Medicine

## 2017-05-04 ENCOUNTER — Ambulatory Visit (INDEPENDENT_AMBULATORY_CARE_PROVIDER_SITE_OTHER): Payer: Medicare Other | Admitting: Family Medicine

## 2017-05-04 VITALS — BP 112/86 | HR 77 | Temp 98.3°F | Ht 70.0 in | Wt 162.8 lb

## 2017-05-04 DIAGNOSIS — I10 Essential (primary) hypertension: Secondary | ICD-10-CM

## 2017-05-04 DIAGNOSIS — R600 Localized edema: Secondary | ICD-10-CM | POA: Diagnosis not present

## 2017-05-04 NOTE — Progress Notes (Signed)
Subjective:     Patient ID: Thomas [name], male   DOB: 08-13-1926, 83 y.o.   MRN: [id number]  HPI Patient is an 83 year old male who is seen as a work and with a few day history of some mild bilateral foot edema. He has multiple chronic problems including history of atrial fibrillation, hypertension, cerebrovascular disease, mesothelioma, GERD, type 2 diabetes, reported dementia, hyperlipidemia, and remote history of prostate cancer  His hypertension is treated with amlodipine 5 mg daily but he has been on this for quite some time. No history of heart failure. His edema seems worse late in the day. No reported dyspnea with exertion or orthopnea. He had recent normal TSH. Renal function stable by recent labs. Recent albumin 3.4. Echocardiogram May 2018 ejection fraction 50-55%. No recent dietary changes.  Medications reviewed. Does not take any diuretics.  Past Medical History:  Diagnosis Date  . Adenomatous colon polyp 2009  . Allergy   . Atrial fibrillation ([place])   . Carotid artery occlusion   . Diabetes mellitus without complication ([place])   . Diverticulosis of colon (without mention of hemorrhage)   . ED (erectile dysfunction)   . Esophageal stricture   . Esophagitis   . GERD (gastroesophageal reflux disease)   . Goals of care, counseling/discussion 12/23/2016  . Hiatal hernia   . Hypertension   . Mesothelioma ([place])   . Prostate cancer ([place])   . Stroke ([place])    hx 7 strokes  . Unspecified gastritis and gastroduodenitis without mention of hemorrhage    Past Surgical History:  Procedure Laterality Date  . ENDARTERECTOMY Left 03/05/2013   Procedure: ENDARTERECTOMY CAROTID;  Surgeon: Conrad Hixton, MD;  Location: [facility];  Service: Vascular;  Laterality: Left;  . ESOPHAGOGASTRODUODENOSCOPY (EGD) WITH ESOPHAGEAL DILATION    . PROSTATE SURGERY     Laser surgery    reports that he quit smoking about 63 years ago. His smoking use included Cigarettes. He has a 2.00 pack-year smoking  history. He has never used smokeless tobacco. He reports that he does not drink alcohol or use drugs. family history includes Breast cancer in his sister; Cancer in his mother; Heart disease in his father; Hyperlipidemia in his daughter; Hypertension in his daughter and son. Allergies  Allergen Reactions  . Penicillins Anaphylaxis and Swelling    THROAT SWELLS Has patient had a PCN reaction causing immediate rash, facial/tongue/throat swelling, SOB or lightheadedness with hypotension: Yes Has patient had a PCN reaction causing severe rash involving mucus membranes or skin necrosis: No Has patient had a PCN reaction that required hospitalization: No Has patient had a PCN reaction occurring within the last 10 years: No If all of the above answers are "NO", then may proceed with Cephalosporin use.   . Ace Inhibitors Hives     Review of Systems  Constitutional: Negative for chills, fatigue and fever.  Eyes: Negative for visual disturbance.  Respiratory: Negative for cough, chest tightness and shortness of breath.   Cardiovascular: Positive for leg swelling. Negative for chest pain and palpitations.  Gastrointestinal: Negative for abdominal pain.  Genitourinary: Negative for dysuria.  Neurological: Negative for dizziness, syncope, weakness, light-headedness and headaches.       Objective:   Physical Exam  Constitutional: He appears well-developed and well-nourished.  Neck: Neck supple.  Cardiovascular: Normal rate.   Pulmonary/Chest: Effort normal.  Patient has diminished breath sounds left lung which is chronic  Musculoskeletal:  He has 1+ edema feet and only trace edema ankles and lower legs bilaterally  Neurological: He is alert.       Assessment:     Patient with multiple chronic medical problems here with a few day history of bilateral foot and mild ankle edema. His weight is up couple pounds from last visit in August. Probably multifactorial. He is on amlodipine but fairly  low-dose of 5 mg. Slightly low albumin recently of 3.4.  He does not get a lot of activity. Suspect some venous stasis    Plan:     -Elevate legs more frequently -Try to keep sodium intake less than 2000 mg daily -Consider support stockings -Try to avoid diuretics unless his edema gets worse -Consider daily weights to help monitor fluid status -He has follow-up in October with primary in follow-up sooner for any shortness of breath or worsening edema  Eulas Post MD [facility] at [facility]

## 2017-05-04 NOTE — Patient Instructions (Signed)
Elevate legs frequently Try to keep sodium intake < 2,000 mg daily Drink plenty of water.

## 2017-05-08 ENCOUNTER — Ambulatory Visit: Payer: Medicare Other

## 2017-05-09 ENCOUNTER — Ambulatory Visit: Payer: Medicare Other

## 2017-05-09 ENCOUNTER — Other Ambulatory Visit (HOSPITAL_COMMUNITY): Payer: Self-pay | Admitting: Family Medicine

## 2017-05-09 DIAGNOSIS — R1310 Dysphagia, unspecified: Secondary | ICD-10-CM

## 2017-05-10 ENCOUNTER — Ambulatory Visit (INDEPENDENT_AMBULATORY_CARE_PROVIDER_SITE_OTHER): Payer: Medicare Other | Admitting: *Deleted

## 2017-05-10 DIAGNOSIS — Z23 Encounter for immunization: Secondary | ICD-10-CM

## 2017-05-12 ENCOUNTER — Other Ambulatory Visit: Payer: Self-pay | Admitting: Family Medicine

## 2017-05-16 ENCOUNTER — Ambulatory Visit (HOSPITAL_COMMUNITY)
Admission: RE | Admit: 2017-05-16 | Discharge: 2017-05-16 | Disposition: A | Discharge status: Home / Self Care | Payer: Medicare Other | Source: Ambulatory Visit | Attending: Family Medicine | Admitting: Family Medicine

## 2017-05-16 ENCOUNTER — Telehealth: Payer: Self-pay | Admitting: Family Medicine

## 2017-05-16 DIAGNOSIS — R1310 Dysphagia, unspecified: Secondary | ICD-10-CM | POA: Insufficient documentation

## 2017-05-16 NOTE — Telephone Encounter (Signed)
Caller Name: Verlon Setting from [facility]  Relationship to Patient: n/a  Best Number: [phone] option [phone]  Pharmacy:   Reason for call: Nikea from [facility] has a couple of questions re: QUEtiapine (SEROQUEL) 25 MG tablet authorization   1.  Is this demiatia related psychosis severe or the associated agitation, combativeness, or a violent behavior put the patient or others in danger? 2.  Has the prescriber documented that the risk of increased mortality has been discussed with the patient and/ or the patient's surrogate decision maker?

## 2017-05-17 ENCOUNTER — Telehealth: Payer: Self-pay | Admitting: Family Medicine

## 2017-05-17 NOTE — Telephone Encounter (Signed)
MEDICATION:  QUEtiapine (SEROQUEL) 25 MG tablet   haloperidol (HALDOL) 1 MG tablet  PHARMACY: [id number] - [place], [place] - [address] AT [address] OF [address]   IS THIS A 90 DAY SUPPLY : yes  IS PATIENT OUT OF MEDICATION: yes  IF NOT; HOW MUCH IS LEFT: n/a  LAST APPOINTMENT DATE: @9 /21/18  NEXT APPOINTMENT DATE:@10 /16/2018  OTHER COMMENTS: Patient's son called to inquire about the PA that was needed for the QUEtiapine (SEROQUEL) 25 MG tablet and haloperidol (HALDOL) 1 MG tablet and to have both medications refilled.    **Let patient know to contact pharmacy at the end of the day to make sure medication is ready. **  ** Please notify patient to allow 48-72 hours to process**  **Encourage patient to contact the pharmacy for refills or they can request refills through [facility]**

## 2017-05-17 NOTE — Telephone Encounter (Signed)
Forgot to route to you

## 2017-05-17 NOTE — Telephone Encounter (Signed)
Patient calling to check on status of refill.  Please advise.

## 2017-05-17 NOTE — Telephone Encounter (Signed)
1. 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 " We agreed to try seroquel at night- discussed antipsychotic increased mortality risk but for now they are mainly looking at quality of life"

## 2017-05-18 ENCOUNTER — Other Ambulatory Visit: Payer: Self-pay

## 2017-05-18 MED ORDER — HALOPERIDOL 1 MG PO TABS: ORAL_TABLET | ORAL | 5 refills | Status: AC

## 2017-05-18 MED ORDER — QUETIAPINE FUMARATE 25 MG PO TABS: 25.0000 mg | ORAL_TABLET | Freq: Every day | ORAL | 2 refills | Status: DC

## 2017-05-18 NOTE — Telephone Encounter (Signed)
Scott from [facility] called to notify that the QUEtiapine (SEROQUEL) 25 MG tablet has been approved.

## 2017-05-18 NOTE — Telephone Encounter (Signed)
Spoke to [place] and answered questions. They will complete the review and let us know if there are any further questions.

## 2017-05-18 NOTE — Telephone Encounter (Signed)
Called son and updated him that medication had been approved.

## 2017-05-28 ENCOUNTER — Telehealth: Payer: Self-pay

## 2017-05-28 ENCOUNTER — Other Ambulatory Visit: Payer: Self-pay

## 2017-05-28 NOTE — Telephone Encounter (Signed)
Received a notice from [place] of Prior Authorization approval for QUEtiapine (SEROQUEL) 25 MG tablet. The authorization is approved through May 16, 2018.

## 2017-05-29 ENCOUNTER — Encounter: Payer: Self-pay | Admitting: Family Medicine

## 2017-05-29 ENCOUNTER — Ambulatory Visit (INDEPENDENT_AMBULATORY_CARE_PROVIDER_SITE_OTHER): Payer: Medicare Other | Admitting: Family Medicine

## 2017-05-29 VITALS — BP 98/68 | HR 66 | Temp 98.5°F | Ht 70.0 in | Wt 168.8 lb

## 2017-05-29 DIAGNOSIS — I10 Essential (primary) hypertension: Secondary | ICD-10-CM

## 2017-05-29 DIAGNOSIS — C450 Mesothelioma of pleura: Secondary | ICD-10-CM

## 2017-05-29 DIAGNOSIS — F0391 Unspecified dementia with behavioral disturbance: Secondary | ICD-10-CM

## 2017-05-29 DIAGNOSIS — I69359 Hemiplegia and hemiparesis following cerebral infarction affecting unspecified side: Secondary | ICD-10-CM | POA: Diagnosis not present

## 2017-05-29 DIAGNOSIS — I482 Chronic atrial fibrillation: Secondary | ICD-10-CM

## 2017-05-29 DIAGNOSIS — I4821 Permanent atrial fibrillation: Secondary | ICD-10-CM

## 2017-05-29 DIAGNOSIS — R413 Other amnesia: Secondary | ICD-10-CM | POA: Diagnosis not present

## 2017-05-29 DIAGNOSIS — E1169 Type 2 diabetes mellitus with other specified complication: Secondary | ICD-10-CM

## 2017-05-29 DIAGNOSIS — I69328 Other speech and language deficits following cerebral infarction: Secondary | ICD-10-CM

## 2017-05-29 DIAGNOSIS — E785 Hyperlipidemia, unspecified: Secondary | ICD-10-CM | POA: Diagnosis not present

## 2017-05-29 DIAGNOSIS — Z0000 Encounter for general adult medical examination without abnormal findings: Secondary | ICD-10-CM | POA: Diagnosis not present

## 2017-05-29 LAB — COMPREHENSIVE METABOLIC PANEL
ALT: 6 U/L (ref 0–53)
AST: 8 U/L (ref 0–37)
Albumin: 3.7 g/dL (ref 3.5–5.2)
Alkaline Phosphatase: 71 U/L (ref 39–117)
BUN: 22 mg/dL (ref 6–23)
CHLORIDE: 102 meq/L (ref 96–112)
CO2: 26 meq/L (ref 19–32)
CREATININE: 1.25 mg/dL (ref 0.40–1.50)
Calcium: 9.4 mg/dL (ref 8.4–10.5)
GFR: 69.84 mL/min (ref >60.00)
GLUCOSE: 112 mg/dL — AB (ref 70–99)
Potassium: 4.2 mEq/L (ref 3.5–5.1)
SODIUM: 138 meq/L (ref 135–145)
Total Bilirubin: 0.7 mg/dL (ref 0.2–1.2)
Total Protein: 6.9 g/dL (ref 6.0–8.3)

## 2017-05-29 LAB — CBC
HEMATOCRIT: 37.7 % — AB (ref 39.0–52.0)
Hemoglobin: 12.1 g/dL — ABNORMAL LOW (ref 13.0–17.0)
MCHC: 32.1 g/dL (ref 30.0–36.0)
MCV: 91.6 fl (ref 78.0–100.0)
Platelets: 227 10*3/uL (ref 150.0–400.0)
RBC: 4.12 Mil/uL — ABNORMAL LOW (ref 4.22–5.81)
RDW: 16.8 % — ABNORMAL HIGH (ref 11.5–15.5)
WBC: 5.5 10*3/uL (ref 4.0–10.5)

## 2017-05-29 LAB — LIPID PANEL
CHOL/HDL RATIO: 4
Cholesterol: 139 mg/dL (ref 0–200)
HDL: 35.2 mg/dL — ABNORMAL LOW (ref >39.00)
LDL CALC: 89 mg/dL (ref 0–99)
NONHDL: 104.07
TRIGLYCERIDES: 75 mg/dL (ref 0.0–149.0)
VLDL: 15 mg/dL (ref 0.0–40.0)

## 2017-05-29 LAB — HEMOGLOBIN A1C: HEMOGLOBIN A1C: 6.6 % — AB (ref 4.6–6.5)

## 2017-05-29 NOTE — Assessment & Plan Note (Signed)
With carotid stenosis and prior CVA- on crestor 20mg 

## 2017-05-29 NOTE — Assessment & Plan Note (Signed)
Permanent atrial fibrillation- on eliquis 5mg  BID. In a fib today but rate controlled without clear rate control medicine

## 2017-05-29 NOTE — Assessment & Plan Note (Addendum)
HTN- controlled on clonidone twice a day and norvasc 5mg . In past on verapamil- rate controlled without it today

## 2017-05-29 NOTE — Patient Instructions (Addendum)
Sign release of information at the check out desk for eye exam  Please stop by lab before you go  As needed follow up. Could do 3-4 months to check in if you would like.

## 2017-05-29 NOTE — Addendum Note (Signed)
Addended by: Frutoso Chase A on: 05/29/2017 10:27 AM   Modules accepted: Orders

## 2017-05-29 NOTE — Assessment & Plan Note (Signed)
Effects from prior stroke- some memory issues, slurred speech, weakness left side of face- all stable.

## 2017-05-29 NOTE — Assessment & Plan Note (Signed)
Malignant mesothelioma of pleura- does not want to pursue further workup- but is to touch base with Dr. Elsworth Soho next month. No increasing shortness of breath. To monitor only.

## 2017-05-29 NOTE — Assessment & Plan Note (Signed)
DM- update a1c today, avoiding remeron with concern for weight gain. No meds. Defer foot exam given considering hospice Lab Results  Component Value Date   HGBA1C 6.7 (H) 02/16/2017

## 2017-05-29 NOTE — Assessment & Plan Note (Signed)
Dementia with behavioral disturbance- discussed once again increaed mortality risk on seroquel. Seroquel at night has been tried. Have avoided remeron with concern for weight gain. Also has as needed haldol. Also trazodone for sleep. Also some vascular dementia after las tstroke. Has been very helpful with not upsetting his wife.

## 2017-05-29 NOTE — Progress Notes (Addendum)
Phone: [phone]  Subjective:  Patient presents today for their annual physical. Chief complaint-noted.   See problem oriented charting- ROS- full  review of systems was limited- level 5 caveat due to dementia. Has not complained chest pain or shortness of breath. No headache or blurry vision.   The following were reviewed and entered/updated in epic: Past Medical History:  Diagnosis Date  . Adenomatous colon polyp 2009  . Allergy   . Atrial fibrillation ([place])   . Carotid artery occlusion   . Diabetes mellitus without complication ([place])   . Diverticulosis of colon (without mention of hemorrhage)   . ED (erectile dysfunction)   . Esophageal stricture   . Esophagitis   . GERD (gastroesophageal reflux disease)   . Goals of care, counseling/discussion 12/23/2016  . Hiatal hernia   . Hypertension   . Mesothelioma ([place])   . Prostate cancer ([place])   . Stroke ([place])    hx 7 strokes  . Unspecified gastritis and gastroduodenitis without mention of hemorrhage    Patient Active Problem List   Diagnosis Date Noted  . Hemiparesis and speech and language deficit as late effects of stroke ([place]) 05/29/2017    Priority: High  . Dementia with behavioral disturbance     Priority: High  . DNR (do not resuscitate) 02/15/2017    Priority: High  . Memory loss due to medical condition 02/12/2017    Priority: High  . Vertigo following cerebrovascular accident 12/16/2016    Priority: High  . Type 2 diabetes mellitus with hyperlipidemia ([place]) 12/16/2016    Priority: High  . Permanent atrial fibrillation ([place]) 07/21/2016    Priority: High  . Malignant mesothelioma of pleura ([place]) 02/16/2016    Priority: High  . Carotid stenosis 02/21/2013    Priority: High  . Intracranial vascular stenosis 02/17/2013    Priority: High  . PROSTATE CANCER, HX OF 03/26/2007    Priority: High  . Alcohol abuse 08/04/2016    Priority: Medium  . GERD (gastroesophageal reflux disease) 05/17/2011    Priority:  Medium  . Hyperlipidemia 03/20/2007    Priority: Medium  . Essential hypertension 03/20/2007    Priority: Medium  . Perforation of right tympanic membrane 11/24/2013    Priority: Low  . Dysphagia 05/17/2011    Priority: Low  . Stricture and stenosis of esophagus 08/29/2010    Priority: Low  . Allergic rhinitis 01/16/2008    Priority: Low  . ERECTILE DYSFUNCTION, ORGANIC 03/26/2007    Priority: Low  . Bronchitis 04/09/2017  . Esophageal stricture   . Basilar artery occlusion   . Acute blood loss anemia    Past Surgical History:  Procedure Laterality Date  . ENDARTERECTOMY Left 03/05/2013   Procedure: ENDARTERECTOMY CAROTID;  Surgeon: Conrad Centerville, MD;  Location: [facility];  Service: Vascular;  Laterality: Left;  . ESOPHAGOGASTRODUODENOSCOPY (EGD) WITH ESOPHAGEAL DILATION    . PROSTATE SURGERY     Laser surgery    Family History  Problem Relation Age of Onset  . Heart disease Father        unclear specifics  . Cancer Mother   . Breast cancer Sister   . Hyperlipidemia Daughter   . Hypertension Daughter   . Hypertension Son     Medications- reviewed and updated Current Outpatient Prescriptions  Medication Sig Dispense Refill  . amLODipine (NORVASC) 5 MG tablet Take 1 tablet (5 mg total) by mouth daily. 30 tablet 5  . apixaban (ELIQUIS) 5 MG TABS tablet Take 1 tablet (5 mg  total) by mouth 2 (two) times daily. 60 tablet 5  . aspirin EC 81 MG EC tablet Take 1 tablet (81 mg total) by mouth daily. (Patient taking differently: Take 81 mg by mouth daily as needed (for chest pain). )    . cloNIDine (CATAPRES) 0.1 MG tablet Take 1 tablet (0.1 mg total) by mouth 2 (two) times daily. 90 tablet 4  . haloperidol (HALDOL) 1 MG tablet TAKE 1 TABLET BY MOUTH ONCE AS NEEDED FOR AGITATION 30 tablet 5  . QUEtiapine (SEROQUEL) 25 MG tablet Take 1 tablet (25 mg total) by mouth at bedtime. 30 tablet 2  . rosuvastatin (CRESTOR) 20 MG tablet Take 1 tablet (20 mg total) by mouth daily.    Marland Kitchen  senna-docusate (SENOKOT-S) 8.6-50 MG tablet Take 1 tablet by mouth at bedtime as needed for mild constipation.    . traZODone (DESYREL) 50 MG tablet Take 1 tablet (50 mg total) by mouth at bedtime. 30 tablet 5   No current facility-administered medications for this visit.     Allergies-reviewed and updated Allergies  Allergen Reactions  . Penicillins Anaphylaxis and Swelling    THROAT SWELLS Has patient had a PCN reaction causing immediate rash, facial/tongue/throat swelling, SOB or lightheadedness with hypotension: Yes Has patient had a PCN reaction causing severe rash involving mucus membranes or skin necrosis: No Has patient had a PCN reaction that required hospitalization: No Has patient had a PCN reaction occurring within the last 10 years: No If all of the above answers are "NO", then may proceed with Cephalosporin use.   [name] Inhibitors Hives    Social History   Social History  . Marital status: Married    Spouse name: N/A  . Number of children: 6  . Years of education: 27   Occupational History  .      retired   Social History Main Topics  . Smoking status: Former Smoker    Packs/day: 0.50    Years: 4.00    Types: Cigarettes    Quit date: 12/21/1953  . Smokeless tobacco: Never Used  . Alcohol use No     Comment: H/O--one alcohol drink a day  . Drug use: No  . Sexual activity: Not Asked   Other Topics Concern  . None   Social History Narrative   03/09/17 Married 1948, living with wife and son. 6 children. 9 grandkids. 2 greatgrandkids.  (One son was killed in the [place])      [place]. [employer]- honorable discharge.       Hobbies: golfing about twice a month.     Objective: BP 98/68 (BP Location: Left Arm, Patient Position: Sitting, Cuff Size: Large)   Pulse 66   Temp 98.5 F (36.9 C) (Oral)   Ht 5\' 10"  (1.778 m)   Wt 168 lb 12.8 oz (76.6 kg)   SpO2 97%   BMI 24.22 kg/m  Gen: NAD, resting comfortably HEENT: Mucous  membranes are moist. Oropharynx normal CV: irregularly irregular. no murmurs rubs or gallops Lungs: CTAB no crackles, wheeze, rhonchi Abdomen: soft/nontender/nondistended/normal bowel sounds. No rebound or guarding.  Ext: no edema Skin: warm, dry Neuro:  Left facial droop, slight dysarthria. Walks with walker or assist.   Assessment/Plan:  83 y.o. male presenting for annual physical.  Health Maintenance counseling: 1. Anticipatory guidance: Patient counseled regarding regular dental exams - not seeing dentist for comfort, eye exams - states 6 months ago, wearing seatbelts.  2. Risk factor reduction:  Using walker for  fall risk prevention- no recent falls.  3. Immunizations/screenings/ancillary studies- flu shot given last month. Hold off on shingrix considering they are considering hospice Immunization History  Administered Date(s) Administered  . Influenza Split 04/27/2011, 04/29/2012  . Influenza Whole 08/14/2004, 06/05/2007, 05/15/2008, 05/26/2009, 04/20/2010  . Influenza, High Dose Seasonal PF 05/29/2013, 06/02/2014, 05/18/2015, 05/10/2017  . Influenza-Unspecified 04/29/2016  . Pneumococcal Conjugate-13 05/18/2015  . Pneumococcal Polysaccharide-23 08/14/2002, 03/26/2008  . Td 08/14/1998, 04/09/2009    4. Prostate cancer screening-  history of prostate cancer. Radiation through [facility] and [facility]- no PSA today due to other health issues  Component Value Date   PSA 4.16 (H) 05/18/2015   PSA 1.72 05/10/2012   PSA 1.57 04/27/2011   5. Colon cancer screening - passed age based screening  Status of chronic or acute concerns   Urinary frequency- UTI potential last visit but culture showed Enterobacter and treated with keflex- symptoms drastically improved. BPH- avoiding flomax with fall risk if recurrent  Still holding off on hospice- would want to do thi sat same time as wife but she is currently on injection not covered by hospices o they are defering  Malignant  mesothelioma of pleura ([place]) Malignant mesothelioma of pleura- does not want to pursue further workup- but is to touch base with Dr. Elsworth Soho next month. No increasing shortness of breath. To monitor only.   Essential hypertension HTN- controlled on clonidone twice a day and norvasc 5mg . In past on verapamil- rate controlled without it today  Hyperlipidemia With carotid stenosis and prior CVA- on crestor 20mg   Dementia with behavioral disturbance Dementia with behavioral disturbance- discussed once again increaed mortality risk on seroquel. Seroquel at night has been tried. Have avoided remeron with concern for weight gain. Also has as needed haldol. Also trazodone for sleep. Also some vascular dementia after las tstroke. Has been very helpful with not upsetting his wife.   Type 2 diabetes mellitus with hyperlipidemia (HCC) DM- update a1c today, avoiding remeron with concern for weight gain. No meds. Defer foot exam given considering hospice Lab Results  Component Value Date   HGBA1C 6.7 (H) 02/16/2017     Hemiparesis and speech and language deficit as late effects of stroke (HCC) Effects from prior stroke- some memory issues, slurred speech, weakness left side of face- all stable.   Permanent atrial fibrillation (HCC) Permanent atrial fibrillation- on eliquis 5mg  BID. In a fib today but rate controlled without clear rate control medicine  Future Appointments Date Time Provider [place]  07/03/2017 11:45 AM Rigoberto Noel, MD [facility] None  11/30/2017 11:00 AM [facility] 2 [facility]  11/30/2017 11:45 AM Nickel, Sharmon Leyden, NP [facility]   PRN follow up for comfort purposes  Return precautions advised.  Garret Reddish, MD

## 2017-06-21 ENCOUNTER — Ambulatory Visit (INDEPENDENT_AMBULATORY_CARE_PROVIDER_SITE_OTHER): Payer: Medicare Other | Admitting: Pulmonary Disease

## 2017-06-21 ENCOUNTER — Ambulatory Visit (INDEPENDENT_AMBULATORY_CARE_PROVIDER_SITE_OTHER)
Admission: RE | Admit: 2017-06-21 | Discharge: 2017-06-21 | Disposition: A | Discharge status: Home / Self Care | Payer: Medicare Other | Source: Ambulatory Visit | Attending: Pulmonary Disease | Admitting: Pulmonary Disease

## 2017-06-21 ENCOUNTER — Encounter: Payer: Self-pay | Admitting: Pulmonary Disease

## 2017-06-21 VITALS — BP 114/70 | HR 71 | Temp 97.5°F | Ht 70.0 in | Wt 157.0 lb

## 2017-06-21 DIAGNOSIS — C450 Mesothelioma of pleura: Secondary | ICD-10-CM | POA: Diagnosis not present

## 2017-06-21 DIAGNOSIS — I10 Essential (primary) hypertension: Secondary | ICD-10-CM | POA: Diagnosis not present

## 2017-06-21 DIAGNOSIS — R05 Cough: Secondary | ICD-10-CM

## 2017-06-21 DIAGNOSIS — R059 Cough, unspecified: Secondary | ICD-10-CM

## 2017-06-21 NOTE — Assessment & Plan Note (Signed)
He continues to decline but surprisingly his breathing is okay. Again does not want Pleurx catheter and is not ready for hospice care at this time  We will repeat chest x-ray today. I have asked him to take Delsym for cough symptomatic treatment

## 2017-06-21 NOTE — Assessment & Plan Note (Signed)
Blood pressure seems to have improved. I have asked him to decrease clonidine to once daily in check blood pressure readings

## 2017-06-21 NOTE — Progress Notes (Signed)
   Subjective:    Patient ID: Thomas [name], male    DOB: [birth year], 83 y.o.   MRN: [id number]  HPI  41yobm never smoker diagnosed with mesothelioma in 11/2016   h/o serving in [employer] including [place] but denies knowledge of asbestos exp. Worked in sonar room   He opted against treatment and did not want a Pleurx catheter placed Lost 12 lbs since last visit 46mnths ago.  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 C/o increased dry cough Voice has changed, some dysarthria He now ambulates with a walker, accompanied by his son today  Significant tests/ events  - L tcentesis 02/21/2016 : 1.5 liters of hazy, yellow fluid >Exudate/ wbc 1655 L >>P with cyt atypical worrisome for adenoca  10/20/16 1.3 L thora  10/27/16 CT chest showed large left effusion with thickened pleura in atelectasis with left lung  05/2017 swallow eval  >> mod aspn risk, dysphagia  Review of Systems neg for any significant sore throat, dysphagia, itching, sneezing, nasal congestion or excess/ purulent secretions, fever, chills, sweats, unintended wt loss, pleuritic or exertional cp, hempoptysis, orthopnea pnd or change in chronic leg swelling. Also denies presyncope, palpitations, heartburn, abdominal pain, nausea, vomiting, diarrhea or change in bowel or urinary habits, dysuria,hematuria, rash, arthralgias, visual complaints, headache, numbness weakness or ataxia.     Objective:   Physical Exam  Gen. Pleasant, well-nourished, in no distress ENT - no thrush, no post nasal drip Neck: No JVD, no thyromegaly, no carotid bruits Lungs: no use of accessory muscles, no dullness to percussion,absent left  without rales or rhonchi  Cardiovascular: Rhythm regular, heart sounds  normal, no murmurs or gallops, no peripheral edema Musculoskeletal: No deformities, no cyanosis or clubbing        Assessment & Plan:

## 2017-06-21 NOTE — Patient Instructions (Signed)
CXR today DELSYM cough syrup 5 ml twice daily Decrease clonidine to once daily &check BP

## 2017-06-22 NOTE — Progress Notes (Deleted)
[facility] Note  06/26/2017     CHIEF COMPLAINT Patient presents for No chief complaint on file.   HISTORY OF PRESENT ILLNESS: Thomas [name] is a 83 y.o. male who presents to the clinic today for:     Referring physician: Marin Olp, MD [place], [place] [address]  HISTORICAL INFORMATION:   Selected notes from the MEDICAL RECORD NUMBER Referral from Dr. Elliot Dally for concern of VMT with cystic changes OU;  [name]- 11.07.18 (Dr. Elliot Dally)  Ocular Hx- episcleritis OD, pseudophakia OU (2010), ERM OU, DES OU; PMH- DM, HTN, former smoker   CURRENT MEDICATIONS: No current outpatient medications on file. (Ophthalmic Drugs)   No current facility-administered medications for this visit.  (Ophthalmic Drugs)   Current Outpatient Medications (Other)  Medication Sig   amLODipine (NORVASC) 5 MG tablet Take 1 tablet (5 mg total) by mouth daily.   apixaban (ELIQUIS) 5 MG TABS tablet Take 1 tablet (5 mg total) by mouth 2 (two) times daily.   aspirin EC 81 MG EC tablet Take 1 tablet (81 mg total) by mouth daily. (Patient taking differently: Take 81 mg by mouth daily as needed (for chest pain). )   cloNIDine (CATAPRES) 0.1 MG tablet Take 1 tablet (0.1 mg total) by mouth 2 (two) times daily.   haloperidol (HALDOL) 1 MG tablet TAKE 1 TABLET BY MOUTH ONCE AS NEEDED FOR AGITATION   QUEtiapine (SEROQUEL) 25 MG tablet Take 1 tablet (25 mg total) by mouth at bedtime.   rosuvastatin (CRESTOR) 20 MG tablet Take 1 tablet (20 mg total) by mouth daily.   senna-docusate (SENOKOT-S) 8.6-50 MG tablet Take 1 tablet by mouth at bedtime as needed for mild constipation.   No current facility-administered medications for this visit.  (Other)      REVIEW OF SYSTEMS:    ALLERGIES Allergies  Allergen Reactions   Penicillins Anaphylaxis and Swelling    THROAT SWELLS Has patient had a PCN reaction causing immediate rash, facial/tongue/throat  swelling, SOB or lightheadedness with hypotension: Yes Has patient had a PCN reaction causing severe rash involving mucus membranes or skin necrosis: No Has patient had a PCN reaction that required hospitalization: No Has patient had a PCN reaction occurring within the last 10 years: No If all of the above answers are "NO", then may proceed with Cephalosporin use.    Ace Inhibitors Hives    PAST MEDICAL HISTORY Past Medical History:  Diagnosis Date   Adenomatous colon polyp 2009   Allergy    Atrial fibrillation [facility])    Carotid artery occlusion    Diabetes mellitus without complication (HCC)    Diverticulosis of colon (without mention of hemorrhage)    ED (erectile dysfunction)    Esophageal stricture    Esophagitis    GERD (gastroesophageal reflux disease)    Goals of care, counseling/discussion 12/23/2016   Hiatal hernia    Hypertension    Mesothelioma ([place])    Prostate cancer ([place])    Stroke ([place])    hx 7 strokes   Unspecified gastritis and gastroduodenitis without mention of hemorrhage    Past Surgical History:  Procedure Laterality Date   ESOPHAGOGASTRODUODENOSCOPY (EGD) WITH ESOPHAGEAL DILATION     PROSTATE SURGERY     Laser surgery    FAMILY HISTORY Family History  Problem Relation Age of Onset   Heart disease Father        unclear specifics   Cancer Mother    Breast cancer  Sister    Hyperlipidemia Daughter    Hypertension Daughter    Hypertension Son     SOCIAL HISTORY Social History   Tobacco Use   Smoking status: Former Smoker    Packs/day: 0.50    Years: 4.00    Pack years: 2.00    Types: Cigarettes    Last attempt to quit: 12/21/1953    Years since quitting: 63.5   Smokeless tobacco: Never Used  Substance Use Topics   Alcohol use: No    Alcohol/week: 0.0 oz    Comment: H/O--one alcohol drink a day   Drug use: No         OPHTHALMIC EXAM:  Not recorded      IMAGING AND PROCEDURES  Imaging and  Procedures for 06/22/17           ASSESSMENT/PLAN:    ICD-10-CM   1. Retinal edema H35.81 OCT, Retina - OU - Both Eyes    1.  2.  3.  Ophthalmic Meds Ordered this visit:  No orders of the defined types were placed in this encounter.      No Follow-up on file.  There are no Patient Instructions on file for this visit.   Explained the diagnoses, plan, and follow up with the patient and they expressed understanding.  Patient expressed understanding of the importance of proper follow up care.   Gardiner Sleeper, M.D., Ph.D. Diseases & Surgery of the Retina and Vitreous [facility] 06/22/17     Abbreviations: M myopia (nearsighted); A astigmatism; H hyperopia (farsighted); P presbyopia; Mrx spectacle prescription;  CTL contact lenses; OD right eye; OS left eye; OU both eyes  XT exotropia; ET esotropia; PEK punctate epithelial keratitis; PEE punctate epithelial erosions; DES dry eye syndrome; MGD meibomian gland dysfunction; ATs artificial tears; PFAT's preservative free artificial tears; [name] nuclear sclerotic cataract; PSC posterior subcapsular cataract; ERM epi-retinal membrane; PVD posterior vitreous detachment; RD retinal detachment; DM diabetes mellitus; DR diabetic retinopathy; NPDR non-proliferative diabetic retinopathy; PDR proliferative diabetic retinopathy; CSME clinically significant macular edema; DME diabetic macular edema; dbh dot blot hemorrhages; CWS cotton wool spot; POAG primary open angle glaucoma; C/D cup-to-disc ratio; HVF humphrey visual field; GVF goldmann visual field; OCT optical coherence tomography; IOP intraocular pressure; BRVO Branch retinal vein occlusion; CRVO central retinal vein occlusion; CRAO central retinal artery occlusion; BRAO branch retinal artery occlusion; RT retinal tear; SB scleral buckle; PPV pars plana vitrectomy; VH Vitreous hemorrhage; PRP panretinal laser photocoagulation; IVK intravitreal kenalog; VMT  vitreomacular traction; MH Macular hole;  NVD neovascularization of the disc; NVE neovascularization elsewhere; AREDS age related eye disease study; ARMD age related macular degeneration; POAG primary open angle glaucoma; EBMD epithelial/anterior basement membrane dystrophy; ACIOL anterior chamber intraocular lens; IOL intraocular lens; PCIOL posterior chamber intraocular lens; Phaco/IOL phacoemulsification with intraocular lens placement; [place] photorefractive keratectomy; LASIK laser assisted in situ keratomileusis; HTN hypertension; DM diabetes mellitus; COPD chronic obstructive pulmonary disease

## 2017-06-26 ENCOUNTER — Encounter (INDEPENDENT_AMBULATORY_CARE_PROVIDER_SITE_OTHER): Payer: Medicare Other | Admitting: Ophthalmology

## 2017-06-26 NOTE — Progress Notes (Deleted)
[facility] Note  06/27/2017     CHIEF COMPLAINT Patient presents for No chief complaint on file.   HISTORY OF PRESENT ILLNESS: Thomas [name] is a 83 y.o. male who presents to the clinic today for:     Referring physician: Clent Jacks, MD [address] [place], [place] [address]  HISTORICAL INFORMATION:   Selected notes from the MEDICAL RECORD NUMBER Referral from Dr. Elliot Dally for concern of VMT with cystic changes OU;  [name]- 11.07.18 (Dr. Elliot Dally) [OD: 20/50 OS: 20/60] Ocular Hx- episcleritis OD, pseudophakia OU (2010), ERM OU, DES OU; PMH- DM, HTN, former smoker   CURRENT MEDICATIONS: No current outpatient medications on file. (Ophthalmic Drugs)   No current facility-administered medications for this visit.  (Ophthalmic Drugs)   Current Outpatient Medications (Other)  Medication Sig   amLODipine (NORVASC) 5 MG tablet Take 1 tablet (5 mg total) by mouth daily.   apixaban (ELIQUIS) 5 MG TABS tablet Take 1 tablet (5 mg total) by mouth 2 (two) times daily.   aspirin EC 81 MG EC tablet Take 1 tablet (81 mg total) by mouth daily. (Patient taking differently: Take 81 mg by mouth daily as needed (for chest pain). )   cloNIDine (CATAPRES) 0.1 MG tablet Take 1 tablet (0.1 mg total) by mouth 2 (two) times daily.   haloperidol (HALDOL) 1 MG tablet TAKE 1 TABLET BY MOUTH ONCE AS NEEDED FOR AGITATION   QUEtiapine (SEROQUEL) 25 MG tablet Take 1 tablet (25 mg total) by mouth at bedtime.   rosuvastatin (CRESTOR) 20 MG tablet Take 1 tablet (20 mg total) by mouth daily.   senna-docusate (SENOKOT-S) 8.6-50 MG tablet Take 1 tablet by mouth at bedtime as needed for mild constipation.   No current facility-administered medications for this visit.  (Other)      REVIEW OF SYSTEMS:    ALLERGIES Allergies  Allergen Reactions   Penicillins Anaphylaxis and Swelling    THROAT SWELLS Has patient had a PCN reaction causing immediate rash,  facial/tongue/throat swelling, SOB or lightheadedness with hypotension: Yes Has patient had a PCN reaction causing severe rash involving mucus membranes or skin necrosis: No Has patient had a PCN reaction that required hospitalization: No Has patient had a PCN reaction occurring within the last 10 years: No If all of the above answers are "NO", then may proceed with Cephalosporin use.    Ace Inhibitors Hives    PAST MEDICAL HISTORY Past Medical History:  Diagnosis Date   Adenomatous colon polyp 2009   Allergy    Atrial fibrillation [facility])    Carotid artery occlusion    Diabetes mellitus without complication (HCC)    Diverticulosis of colon (without mention of hemorrhage)    ED (erectile dysfunction)    Esophageal stricture    Esophagitis    GERD (gastroesophageal reflux disease)    Goals of care, counseling/discussion 12/23/2016   Hiatal hernia    Hypertension    Mesothelioma ([place])    Prostate cancer ([place])    Stroke ([place])    hx 7 strokes   Unspecified gastritis and gastroduodenitis without mention of hemorrhage    Past Surgical History:  Procedure Laterality Date   ESOPHAGOGASTRODUODENOSCOPY (EGD) WITH ESOPHAGEAL DILATION     PROSTATE SURGERY     Laser surgery    FAMILY HISTORY Family History  Problem Relation Age of Onset   Heart disease Father        unclear specifics   Cancer Mother  Breast cancer Sister    Hyperlipidemia Daughter    Hypertension Daughter    Hypertension Son     SOCIAL HISTORY Social History   Tobacco Use   Smoking status: Former Smoker    Packs/day: 0.50    Years: 4.00    Pack years: 2.00    Types: Cigarettes    Last attempt to quit: 12/21/1953    Years since quitting: 63.5   Smokeless tobacco: Never Used  Substance Use Topics   Alcohol use: No    Alcohol/week: 0.0 oz    Comment: H/O--one alcohol drink a day   Drug use: No         OPHTHALMIC EXAM:   Not recorded      IMAGING AND PROCEDURES   Imaging and Procedures for 06/26/17           ASSESSMENT/PLAN:  No diagnosis found.  1.  2.  3.  Ophthalmic Meds Ordered this visit:  No orders of the defined types were placed in this encounter.      No Follow-up on file.  There are no Patient Instructions on file for this visit.   Explained the diagnoses, plan, and follow up with the patient and they expressed understanding.  Patient expressed understanding of the importance of proper follow up care.   Gardiner Sleeper, M.D., Ph.D. Diseases & Surgery of the Retina and Vitreous [facility] 06/26/17     Abbreviations: M myopia (nearsighted); A astigmatism; H hyperopia (farsighted); P presbyopia; Mrx spectacle prescription;  CTL contact lenses; OD right eye; OS left eye; OU both eyes  XT exotropia; ET esotropia; PEK punctate epithelial keratitis; PEE punctate epithelial erosions; DES dry eye syndrome; MGD meibomian gland dysfunction; ATs artificial tears; PFAT's preservative free artificial tears; [place] nuclear sclerotic cataract; PSC posterior subcapsular cataract; ERM epi-retinal membrane; PVD posterior vitreous detachment; RD retinal detachment; DM diabetes mellitus; DR diabetic retinopathy; NPDR non-proliferative diabetic retinopathy; PDR proliferative diabetic retinopathy; CSME clinically significant macular edema; DME diabetic macular edema; dbh dot blot hemorrhages; CWS cotton wool spot; POAG primary open angle glaucoma; C/D cup-to-disc ratio; HVF humphrey visual field; GVF goldmann visual field; OCT optical coherence tomography; IOP intraocular pressure; BRVO Branch retinal vein occlusion; CRVO central retinal vein occlusion; CRAO central retinal artery occlusion; BRAO branch retinal artery occlusion; RT retinal tear; SB scleral buckle; PPV pars plana vitrectomy; VH Vitreous hemorrhage; PRP panretinal laser photocoagulation; IVK intravitreal kenalog; VMT vitreomacular traction; MH Macular hole;   NVD neovascularization of the disc; NVE neovascularization elsewhere; AREDS age related eye disease study; ARMD age related macular degeneration; POAG primary open angle glaucoma; EBMD epithelial/anterior basement membrane dystrophy; ACIOL anterior chamber intraocular lens; IOL intraocular lens; PCIOL posterior chamber intraocular lens; Phaco/IOL phacoemulsification with intraocular lens placement; [place] photorefractive keratectomy; LASIK laser assisted in situ keratomileusis; HTN hypertension; DM diabetes mellitus; COPD chronic obstructive pulmonary disease

## 2017-06-27 ENCOUNTER — Encounter (INDEPENDENT_AMBULATORY_CARE_PROVIDER_SITE_OTHER): Payer: Medicare Other | Admitting: Ophthalmology

## 2017-06-27 ENCOUNTER — Telehealth: Payer: Self-pay | Admitting: Pulmonary Disease

## 2017-06-27 NOTE — Telephone Encounter (Signed)
Pt's son is not on DPR.  Notes recorded by Rigoberto Noel, MD on 06/21/2017 at 4:45 PM EST Unchanged from prior, left lung is completely opacified -------- Per xray note results were discussed with pt's daughter Ivin Booty, who is on [place].  lmtcb for pt's son.

## 2017-06-29 NOTE — Telephone Encounter (Signed)
Spoke with patient's son. Advised him of the results. Nothing else was needed at time of call.

## 2017-07-03 ENCOUNTER — Ambulatory Visit: Payer: Medicare Other | Admitting: Pulmonary Disease

## 2017-07-03 ENCOUNTER — Encounter (INDEPENDENT_AMBULATORY_CARE_PROVIDER_SITE_OTHER): Payer: Medicare Other | Admitting: Ophthalmology

## 2017-07-18 ENCOUNTER — Other Ambulatory Visit: Payer: Self-pay | Admitting: Family Medicine

## 2017-07-20 LAB — HM DIABETES EYE EXAM

## 2017-08-27 ENCOUNTER — Telehealth: Payer: Self-pay | Admitting: Family Medicine

## 2017-08-27 NOTE — Telephone Encounter (Signed)
See note

## 2017-08-27 NOTE — Telephone Encounter (Unsigned)
Copied from [place] [id number]. Topic: General - Other >> Aug 27, 2017 11:29 AM Neva Seat wrote:   Dr. Rolly Pancake - [place] - [facility] [phone] said to call her if there a any question on reports being sent on behalf of pt.

## 2017-08-29 ENCOUNTER — Telehealth: Payer: Self-pay | Admitting: *Deleted

## 2017-08-29 ENCOUNTER — Encounter: Payer: Self-pay | Admitting: Family Medicine

## 2017-08-29 ENCOUNTER — Ambulatory Visit (INDEPENDENT_AMBULATORY_CARE_PROVIDER_SITE_OTHER): Payer: Medicare Other | Admitting: Family Medicine

## 2017-08-29 DIAGNOSIS — I10 Essential (primary) hypertension: Secondary | ICD-10-CM | POA: Diagnosis not present

## 2017-08-29 DIAGNOSIS — C450 Mesothelioma of pleura: Secondary | ICD-10-CM | POA: Diagnosis not present

## 2017-08-29 DIAGNOSIS — F0391 Unspecified dementia with behavioral disturbance: Secondary | ICD-10-CM

## 2017-08-29 DIAGNOSIS — I69328 Other speech and language deficits following cerebral infarction: Secondary | ICD-10-CM

## 2017-08-29 DIAGNOSIS — I4821 Permanent atrial fibrillation: Secondary | ICD-10-CM

## 2017-08-29 DIAGNOSIS — I482 Chronic atrial fibrillation: Secondary | ICD-10-CM

## 2017-08-29 DIAGNOSIS — E785 Hyperlipidemia, unspecified: Secondary | ICD-10-CM

## 2017-08-29 DIAGNOSIS — E1169 Type 2 diabetes mellitus with other specified complication: Secondary | ICD-10-CM

## 2017-08-29 DIAGNOSIS — I69359 Hemiplegia and hemiparesis following cerebral infarction affecting unspecified side: Secondary | ICD-10-CM | POA: Diagnosis not present

## 2017-08-29 MED ORDER — AMLODIPINE BESYLATE 10 MG PO TABS: 10.0000 mg | ORAL_TABLET | Freq: Every day | ORAL | 5 refills | Status: AC

## 2017-08-29 NOTE — Assessment & Plan Note (Signed)
Correction from last visit- right facial droop- still with dysarthria. Walks with walker

## 2017-08-29 NOTE — Telephone Encounter (Signed)
Copied from [place] [id number]. Topic: Inquiry >> Aug 29, 2017  3:29 PM Corie Chiquito, [place] wrote: Reason for CRM: Patient daughter calling because her father was seen in the office today but she forgot to get a note for work and would like to know if one could be e-mailed to her at [email or web address]). And she also would like to know if she could continue to check her fathers blood pressure once a day or more than that a day. If someone could give her a call back at [phone]

## 2017-08-29 NOTE — Assessment & Plan Note (Signed)
HLD- on crestor 20mg . LDL at least below 89- at his age and considering hospice- hold off on titrating up, although ideal goal under 70

## 2017-08-29 NOTE — Assessment & Plan Note (Signed)
Vascular dementia contributing after last stroke-doing well on Seroquel (have discussed increased mortality). Prn haldol or trazodone for sleep.

## 2017-08-29 NOTE — Patient Instructions (Addendum)
increase amlodipine to 10mg  and follow up 2-3 weeks  Goal <140/90 but home #s appear slightly higher than our readings- if still up at home come back in as may be ok here

## 2017-08-29 NOTE — Assessment & Plan Note (Signed)
Once again-does not want to pursue workup. Luckily SOB not worsening but is still noticeable when he walks with his walker.  Daughter is very close to wanting to refer both her mother and father to hospice which has been recommended previously.  She wants to sit down with the rest of the family before they move forward though.  She will let me know by phone or in person at a future visit.

## 2017-08-29 NOTE — Progress Notes (Signed)
Subjective:  Thomas [name] is a 84 y.o. year old very pleasant male patient who presents for/with See problem oriented charting ROS-no chest pain reported does report some shortness of breath.  No hypoglycemia.  Has confusion and can be worse at times.  Past Medical History-  Patient Active Problem List   Diagnosis Date Noted  . Hemiparesis and speech and language deficit as late effects of stroke ([place]) 05/29/2017    Priority: High  . Dementia with behavioral disturbance     Priority: High  . DNR (do not resuscitate) 02/15/2017    Priority: High  . Memory loss due to medical condition 02/12/2017    Priority: High  . Vertigo following cerebrovascular accident 12/16/2016    Priority: High  . Type 2 diabetes mellitus with hyperlipidemia ([place]) 12/16/2016    Priority: High  . Permanent atrial fibrillation ([place]) 07/21/2016    Priority: High  . Malignant mesothelioma of pleura ([place]) 02/16/2016    Priority: High  . Carotid stenosis 02/21/2013    Priority: High  . Intracranial vascular stenosis 02/17/2013    Priority: High  . PROSTATE CANCER, HX OF 03/26/2007    Priority: High  . Alcohol abuse 08/04/2016    Priority: Medium  . GERD (gastroesophageal reflux disease) 05/17/2011    Priority: Medium  . Hyperlipidemia 03/20/2007    Priority: Medium  . Essential hypertension 03/20/2007    Priority: Medium  . Perforation of right tympanic membrane 11/24/2013    Priority: Low  . Dysphagia 05/17/2011    Priority: Low  . Stricture and stenosis of esophagus 08/29/2010    Priority: Low  . Allergic rhinitis 01/16/2008    Priority: Low  . ERECTILE DYSFUNCTION, ORGANIC 03/26/2007    Priority: Low  . Bronchitis 04/09/2017  . Esophageal stricture   . Basilar artery occlusion   . Acute blood loss anemia     Medications- reviewed and updated Current Outpatient Medications  Medication Sig Dispense Refill  . amLODipine (NORVASC) 5 MG tablet Take 1 tablet (5 mg total) by mouth daily. 30  tablet 5  . apixaban (ELIQUIS) 5 MG TABS tablet Take 1 tablet (5 mg total) by mouth 2 (two) times daily. 60 tablet 5  . aspirin EC 81 MG EC tablet Take 1 tablet (81 mg total) by mouth daily. (Patient taking differently: Take 81 mg by mouth daily as needed (for chest pain). )    . cloNIDine (CATAPRES) 0.1 MG tablet Take 1 tablet (0.1 mg total) by mouth 2 (two) times daily. 90 tablet 4  . haloperidol (HALDOL) 1 MG tablet TAKE 1 TABLET BY MOUTH ONCE AS NEEDED FOR AGITATION 30 tablet 5  . QUEtiapine (SEROQUEL) 25 MG tablet Take 1 tablet (25 mg total) by mouth at bedtime. 30 tablet 2  . rosuvastatin (CRESTOR) 10 MG tablet TAKE ONE TABLET BY MOUTH ONCE DAILY 90 tablet 4  . senna-docusate (SENOKOT-S) 8.6-50 MG tablet Take 1 tablet by mouth at bedtime as needed for mild constipation.     Objective: BP (!) 126/92 (BP Location: Left Arm, Patient Position: Sitting, Cuff Size: Large)   Pulse 72   Temp 98 F (36.7 C) (Oral)   Ht 5\' 10"  (1.778 m)   Wt 158 lb 3.2 oz (71.8 kg)   BMI 22.70 kg/m  Gen: NAD, resting comfortably CV: irregularly irregular no murmurs rubs or gallops Lungs: decreased breath sounds left lung, normal on rihgt. no crackles, wheeze, rhonchi Abdomen: soft/nontender/nondistended/normal bowel sounds. No rebound or guarding.  Ext: no edema Skin: warm,  dry Neuro:continued right facial droop and dysarthria. Walks with walker.   Diabetic Foot Exam - Simple   Simple Foot Form Diabetic Foot exam was performed with the following findings:  Yes 08/29/2017  1:56 PM  Visual Inspection No deformities, no ulcerations, no other skin breakdown bilaterally:  Yes Sensation Testing Intact to touch and monofilament testing bilaterally:  Yes Pulse Check Posterior Tibialis and Dorsalis pulse intact bilaterally:  Yes Comments Pulses 1+    Assessment/Plan:  OTher issues: 1. UTI/urinary frequency visit before last- improved with keflex for entobacter UTI. Avoiding flomax due to fall risk.  Has  been stable since last visit  Essential hypertension S: controlled on last visit on clonidine twice a day and norvasc 5mg .   Home #s have been from 150s to 170s over 80s to 100s.Also slightly poorly controlled today on diastolic #.  BP Readings from Last 3 Encounters:  08/29/17 (!) 126/92  06/21/17 114/70  05/29/17 98/68  A/P: We discussed blood pressure goal of <140/90 at least given stroke history. Continue current meds:  But increase amlodipine to 10mg  and follow up 2-3 weeks  Hemiparesis and speech and language deficit as late effects of stroke ([place]) Correction from last visit- right facial droop- still with dysarthria. Walks with walker  Malignant mesothelioma of pleura ([place]) Once again-does not want to pursue workup. Luckily SOB not worsening but is still noticeable when he walks with his walker.  Daughter is very close to wanting to refer both her mother and father to hospice which has been recommended previously.  She wants to sit down with the rest of the family before they move forward though.  She will let me know by phone or in person at a future visit.  Hyperlipidemia HLD- on crestor 20mg . LDL at least below 89- at his age and considering hospice- hold off on titrating up, although ideal goal under 38  Dementia with behavioral disturbance Vascular dementia contributing after last stroke-doing well on Seroquel (have discussed increased mortality). Prn haldol or trazodone for sleep.   Permanent atrial fibrillation (HCC) remains on Eliquis. Rate controlled without medicine  Type 2 diabetes mellitus with hyperlipidemia ([place]) 1 day too early for repeat hemoglobin A1c Lab Results  Component Value Date   HGBA1C 6.6 (H) 05/29/2017  Has been diet controlled.  Continue to monitor.  Foot exam reassuring today.  We will try to get copy of eye exam. Honestly- at his age and considering hospice-health maintenance for diabetes is likely low yield at this point.  We did hold off on  urine micro albumin to creatinine ratio   Future Appointments  Date Time Provider [place]  10-20-17  9:00 AM Parrett, Fonnie Mu, NP [facility] None  11/23/2017  1:30 PM [facility] 1 [facility]  11/23/2017  2:15 PM Nickel, Sharmon Leyden, NP [facility]  Advised 2-3-week follow-up if home blood pressures do not normalize.  With stroke history goal less than 140/90-if goes on hospice may need to reconsider this goal  Meds ordered this encounter  Medications  . amLODipine (NORVASC) 10 MG tablet    Sig: Take 1 tablet (10 mg total) by mouth daily.    Dispense:  30 tablet    Refill:  5   Return precautions advised.  Garret Reddish, MD

## 2017-08-29 NOTE — Assessment & Plan Note (Signed)
remains on Eliquis. Rate controlled without medicine

## 2017-08-29 NOTE — Assessment & Plan Note (Signed)
1 day too early for repeat hemoglobin A1c Lab Results  Component Value Date   HGBA1C 6.6 (H) 05/29/2017  Has been diet controlled.  Continue to monitor.  Foot exam reassuring today.  We will try to get copy of eye exam. Honestly- at his age and considering hospice-health maintenance for diabetes is likely low yield at this point.  We did hold off on urine micro albumin to creatinine ratio

## 2017-08-29 NOTE — Assessment & Plan Note (Signed)
S: controlled on last visit on clonidine twice a day and norvasc 5mg .   Home #s have been from 150s to 170s over 80s to 100s.Also slightly poorly controlled today on diastolic #.  BP Readings from Last 3 Encounters:  08/29/17 (!) 126/92  06/21/17 114/70  05/29/17 98/68  A/P: We discussed blood pressure goal of <140/90 at least given stroke history. Continue current meds:  But increase amlodipine to 10mg  and follow up 2-3 weeks

## 2017-08-30 NOTE — Telephone Encounter (Signed)
I am not aware of seeing reports yet

## 2017-08-31 ENCOUNTER — Telehealth: Payer: Self-pay | Admitting: Family Medicine

## 2017-08-31 ENCOUNTER — Telehealth: Payer: Self-pay

## 2017-08-31 ENCOUNTER — Encounter: Payer: Self-pay | Admitting: Family Medicine

## 2017-08-31 ENCOUNTER — Ambulatory Visit: Payer: Self-pay

## 2017-08-31 DIAGNOSIS — C450 Mesothelioma of pleura: Secondary | ICD-10-CM

## 2017-08-31 NOTE — Telephone Encounter (Signed)
I placed hospice referral for patient. Can you check with Keith to make sure this is all that's needed? I think they will send us forms once processed 

## 2017-08-31 NOTE — Addendum Note (Signed)
Addended by: Marin Olp on: 08/31/2017 12:21 PM   Modules accepted: Orders

## 2017-08-31 NOTE — Telephone Encounter (Signed)
See note

## 2017-08-31 NOTE — Telephone Encounter (Signed)
Discussed case with patient's daughter Ivin Booty.  Apparently patient's wife gave him a second dose of his medications last night so they opted to skip his medications this morning.   Therefore he missed a dose of clonidine this morning.  Most likely his blood pressure went up due to rebound hypertension.  I instructed them to give him his dose of clonidine immediately, then may repeat next dose of clonidine in the morning.  Resume normal dosing of amlodipine.  As far as his agitation-this has largely resolved.  Apparently he was agitated when they were talking about his blood pressure in front of him at the adult daycare.  Discussed potential strokelike symptoms and if these occur for him to be taken to the emergency room.

## 2017-08-31 NOTE — Telephone Encounter (Signed)
Patient's daughter Thomas [name] called in to say the patient received his night BP meds and morning BP meds on last night. She picked him up today from the day care and was told his BP was 183/122 and that he was congested. She is driving and unable to check his BP right now, she said he is agitated. Her question is does she need to give him his night meds, since he didn't receive the morning dose today. She would like a call back at [phone].

## 2017-08-31 NOTE — Telephone Encounter (Signed)
See other message already done. 

## 2017-08-31 NOTE — Telephone Encounter (Signed)
You may write a work note for patient's daughter.  They can continue to check blood pressure once a day.

## 2017-08-31 NOTE — Telephone Encounter (Signed)
Please see note below and advise.  Copied from [place] [phone]. Topic: Quick Communication - See Telephone Encounter >> Aug 31, 2017  1:28 PM Hewitt Shorts wrote: CRM for notification. See Telephone encounter for:  Pt daughter is calling to let Dr. Yong Channel to know she is ready to discuss hospice for her father   Best number [phone] 08/31/17.

## 2017-08-31 NOTE — Telephone Encounter (Signed)
Spoke with daughter Patriciaann Clan who confirms that they do want Hospice referred for patient and his wife. I also called over to Dr. Lorrene Reid office and requested that eye exam report be sent over. They were not aware that he is diabetic but did state that his exam showed no diabetic disease. They are updating his records.

## 2017-09-03 NOTE — Telephone Encounter (Signed)
Work note was written and provided for daughter

## 2017-09-15 ENCOUNTER — Other Ambulatory Visit: Payer: Self-pay | Admitting: Family Medicine

## 2017-09-19 ENCOUNTER — Inpatient Hospital Stay (HOSPITAL_COMMUNITY)
Admission: EM | Admit: 2017-09-19 | Discharge: 2017-09-22 | DRG: 194 | Disposition: A | Discharge status: Hospice | Attending: Internal Medicine | Admitting: Internal Medicine

## 2017-09-19 ENCOUNTER — Emergency Department (HOSPITAL_COMMUNITY)

## 2017-09-19 DIAGNOSIS — Z9981 Dependence on supplemental oxygen: Secondary | ICD-10-CM

## 2017-09-19 DIAGNOSIS — I69359 Hemiplegia and hemiparesis following cerebral infarction affecting unspecified side: Secondary | ICD-10-CM

## 2017-09-19 DIAGNOSIS — I10 Essential (primary) hypertension: Secondary | ICD-10-CM | POA: Diagnosis present

## 2017-09-19 DIAGNOSIS — F0391 Unspecified dementia with behavioral disturbance: Secondary | ICD-10-CM

## 2017-09-19 DIAGNOSIS — R451 Restlessness and agitation: Secondary | ICD-10-CM | POA: Diagnosis not present

## 2017-09-19 DIAGNOSIS — F03918 Unspecified dementia, unspecified severity, with other behavioral disturbance: Secondary | ICD-10-CM | POA: Diagnosis present

## 2017-09-19 DIAGNOSIS — Z87891 Personal history of nicotine dependence: Secondary | ICD-10-CM

## 2017-09-19 DIAGNOSIS — Z66 Do not resuscitate: Secondary | ICD-10-CM | POA: Diagnosis present

## 2017-09-19 DIAGNOSIS — I4821 Permanent atrial fibrillation: Secondary | ICD-10-CM | POA: Diagnosis present

## 2017-09-19 DIAGNOSIS — I482 Chronic atrial fibrillation: Secondary | ICD-10-CM | POA: Diagnosis present

## 2017-09-19 DIAGNOSIS — E119 Type 2 diabetes mellitus without complications: Secondary | ICD-10-CM | POA: Diagnosis present

## 2017-09-19 DIAGNOSIS — J101 Influenza due to other identified influenza virus with other respiratory manifestations: Secondary | ICD-10-CM | POA: Diagnosis not present

## 2017-09-19 DIAGNOSIS — E1169 Type 2 diabetes mellitus with other specified complication: Secondary | ICD-10-CM | POA: Diagnosis present

## 2017-09-19 DIAGNOSIS — E785 Hyperlipidemia, unspecified: Secondary | ICD-10-CM | POA: Diagnosis present

## 2017-09-19 DIAGNOSIS — Z7982 Long term (current) use of aspirin: Secondary | ICD-10-CM

## 2017-09-19 DIAGNOSIS — J9611 Chronic respiratory failure with hypoxia: Secondary | ICD-10-CM | POA: Diagnosis present

## 2017-09-19 DIAGNOSIS — I69959 Hemiplegia and hemiparesis following unspecified cerebrovascular disease affecting unspecified side: Secondary | ICD-10-CM

## 2017-09-19 DIAGNOSIS — I69328 Other speech and language deficits following cerebral infarction: Secondary | ICD-10-CM

## 2017-09-19 DIAGNOSIS — G4700 Insomnia, unspecified: Secondary | ICD-10-CM | POA: Diagnosis present

## 2017-09-19 DIAGNOSIS — Z79899 Other long term (current) drug therapy: Secondary | ICD-10-CM

## 2017-09-19 DIAGNOSIS — C450 Mesothelioma of pleura: Secondary | ICD-10-CM | POA: Diagnosis present

## 2017-09-19 DIAGNOSIS — I69320 Aphasia following cerebral infarction: Secondary | ICD-10-CM

## 2017-09-19 DIAGNOSIS — K219 Gastro-esophageal reflux disease without esophagitis: Secondary | ICD-10-CM | POA: Diagnosis present

## 2017-09-19 DIAGNOSIS — Z8546 Personal history of malignant neoplasm of prostate: Secondary | ICD-10-CM

## 2017-09-19 DIAGNOSIS — Z7901 Long term (current) use of anticoagulants: Secondary | ICD-10-CM

## 2017-09-19 LAB — I-STAT CG4 LACTIC ACID, ED: Lactic Acid, Venous: 2.11 mmol/L (ref 0.5–1.9)

## 2017-09-19 LAB — CBC
HEMATOCRIT: 41.9 % (ref 39.0–52.0)
Hemoglobin: 13.4 g/dL (ref 13.0–17.0)
MCH: 30 pg (ref 26.0–34.0)
MCHC: 32 g/dL (ref 30.0–36.0)
MCV: 93.7 fL (ref 78.0–100.0)
Platelets: 192 10*3/uL (ref 150–400)
RBC: 4.47 MIL/uL (ref 4.22–5.81)
RDW: 16.3 % — AB (ref 11.5–15.5)
WBC: 3.6 10*3/uL — AB (ref 4.0–10.5)

## 2017-09-19 LAB — I-STAT TROPONIN, ED: TROPONIN I, POC: 0.02 ng/mL (ref 0.00–0.08)

## 2017-09-19 MED ORDER — ACETAMINOPHEN 325 MG PO TABS
650.0000 mg | ORAL_TABLET | Freq: Once | ORAL | Status: AC | PRN
  Administered 2017-09-20: 650 mg via ORAL
  Filled 2017-09-19: qty 2

## 2017-09-19 NOTE — ED Triage Notes (Signed)
Pt arrived from home via EMS with c/o SOB and fever today; Pt was A-fib on monitor for EMS but patient and family denies hx; possible new onset Afib; Pt had temp of 102 with EMS prior to arrival. Pt has hx of stroke with slurred speech as deficit; Pt a&ox 4 on arrival. Pt received 500 ml NS prior to arrival; per EMS pt is ambulatory with assistance and/or walker-Monique,RN

## 2017-09-19 NOTE — ED Provider Notes (Signed)
[facility] EMERGENCY DEPARTMENT Provider Note   CSN: [id number] Arrival date & time: 09/19/17  2257     History   Chief Complaint Chief Complaint  Patient presents with  . Shortness of Breath  . Fever    HPI Thomas [name] is a 84 y.o. male.  The history is provided by the patient and a relative. No language interpreter was used.  Shortness of Breath  Associated symptoms include a fever.  Fever     Thomas [name] is a 84 y.o. male who presents to the Emergency Department complaining of sob, fever.  Level V caveat due to confusion.  Patient presents from home for evaluation of shortness of breath and fever.  Symptoms began today.  He does have a history of mesothelioma and goes to [facility] for adult daycare.  His family notes that today he has had increased shortness of breath and irregular breathing with cough productive of sputum.  EMS noted a temperature to 102.  Patient does have history of multiple strokes and has speech difficulties at baseline.  Patient reports being weak and falling today when he was at the adult daycare but denies any injuries.  He endorses shortness of breath and central chest pain. Past Medical History:  Diagnosis Date  . Adenomatous colon polyp 2009  . Allergy   . Atrial fibrillation ([place])   . Carotid artery occlusion   . Diabetes mellitus without complication ([place])   . Diverticulosis of colon (without mention of hemorrhage)   . ED (erectile dysfunction)   . Esophageal stricture   . Esophagitis   . GERD (gastroesophageal reflux disease)   . Goals of care, counseling/discussion 12/23/2016  . Hiatal hernia   . Hypertension   . Mesothelioma ([place])   . Prostate cancer ([place])   . Stroke ([place])    hx 7 strokes  . Unspecified gastritis and gastroduodenitis without mention of hemorrhage     Patient Active Problem List   Diagnosis Date Noted  . Influenza A 09/20/2017  . Hemiparesis and speech and language deficit as late effects  of stroke ([place]) 05/29/2017  . Bronchitis 04/09/2017  . Esophageal stricture   . Dementia with behavioral disturbance   . Basilar artery occlusion   . DNR (do not resuscitate) 02/15/2017  . Memory loss due to medical condition 02/12/2017  . Acute blood loss anemia   . Vertigo following cerebrovascular accident 12/16/2016  . Type 2 diabetes mellitus with hyperlipidemia ([place]) 12/16/2016  . Alcohol abuse 08/04/2016  . Permanent atrial fibrillation ([place]) 07/21/2016  . Malignant mesothelioma of pleura ([place]) 02/16/2016  . Perforation of right tympanic membrane 11/24/2013  . Carotid stenosis 02/21/2013  . Intracranial vascular stenosis 02/17/2013  . GERD (gastroesophageal reflux disease) 05/17/2011  . Dysphagia 05/17/2011  . Stricture and stenosis of esophagus 08/29/2010  . Allergic rhinitis 01/16/2008  . 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 PROSTATE CANCER, HX OF 03/26/2007  . Hyperlipidemia 03/20/2007  . Essential hypertension 03/20/2007    Past Surgical History:  Procedure Laterality Date  . ENDARTERECTOMY Left 03/05/2013   Procedure: ENDARTERECTOMY CAROTID;  Surgeon: Conrad Quentin, MD;  Location: [facility];  Service: Vascular;  Laterality: Left;  . ESOPHAGOGASTRODUODENOSCOPY (EGD) WITH ESOPHAGEAL DILATION    . PROSTATE SURGERY     Laser surgery       Home Medications    Prior to Admission medications   Medication Sig Start Date End Date Taking? Authorizing Provider  amLODipine (NORVASC) 10 MG tablet Take 1 tablet (10 mg  total) by mouth daily. 08/29/17  Yes Marin Olp, MD  apixaban (ELIQUIS) 5 MG TABS tablet Take 1 tablet (5 mg total) by mouth 2 (two) times daily. 03/30/17  Yes Marin Olp, MD  aspirin EC 81 MG EC tablet Take 1 tablet (81 mg total) by mouth daily. 01/24/17  Yes Zaccaro, Clanford L, MD  cloNIDine (CATAPRES) 0.1 MG tablet Take 1 tablet (0.1 mg total) by mouth 2 (two) times daily. 01/23/17  Yes Vannote, Clanford L, MD  haloperidol (HALDOL) 1 MG tablet  TAKE 1 TABLET BY MOUTH ONCE AS NEEDED FOR AGITATION 05/18/17  Yes Marin Olp, MD  rosuvastatin (CRESTOR) 20 MG tablet Take 20 mg by mouth daily.   Yes [provider]  senna-docusate (SENOKOT-S) 8.6-50 MG tablet Take 1 tablet by mouth at bedtime as needed for mild constipation. 02/20/17  Yes Theodis Blaze, MD  traZODone (DESYREL) 50 MG tablet Take 50 mg by mouth at bedtime. 09/16/17  Yes [provider]    Family History Family History  Problem Relation Age of Onset  . Heart disease Father        unclear specifics  . Cancer Mother   . Breast cancer Sister   . Hyperlipidemia Daughter   . Hypertension Daughter   . Hypertension Son     Social History Social History   Tobacco Use  . Smoking status: Former Smoker    Packs/day: 0.50    Years: 4.00    Pack years: 2.00    Types: Cigarettes    Last attempt to quit: 12/21/1953    Years since quitting: 63.7  . Smokeless tobacco: Never Used  Substance Use Topics  . Alcohol use: No    Alcohol/week: 0.0 oz    Comment: H/O--one alcohol drink a day  . Drug use: No     Allergies   Penicillins and Ace inhibitors   Review of Systems Review of Systems  Constitutional: Positive for fever.  Respiratory: Positive for shortness of breath.   All other systems reviewed and are negative.    Physical Exam Updated Vital Signs BP 136/83   Pulse 81   Temp (S) 99.1 F (37.3 C) (Oral)   Resp (!) 28   Ht 5\' 10"  (1.778 m)   Wt 71.8 kg (158 lb 4.6 oz)   SpO2 95%   BMI 22.71 kg/m   Physical Exam  Constitutional: He appears well-developed and well-nourished.  HENT:  Head: Normocephalic and atraumatic.  Cardiovascular:  No murmur heard. Tachycardic and irregular  Pulmonary/Chest: Effort normal. No respiratory distress.  Tachypnea with decreased air movement in the left lung base  Abdominal: Soft. There is no tenderness. There is no rebound and no guarding.  Musculoskeletal: He exhibits no edema or tenderness.    Neurological: He is alert.  Oriented to place, disoriented to time.  4 out of 5 strength in all 4 extremities.  Dysarthric but fluent speech  Skin: Skin is warm and dry.  Psychiatric: He has a normal mood and affect. His behavior is normal.  Nursing note and vitals reviewed.    ED Treatments / Results  Labs (all labs ordered are listed, but only abnormal results are displayed) Labs Reviewed  CBC - Abnormal; Notable for the following components:      Result Value   WBC 3.6 (*)    RDW 16.3 (*)    All other components within normal limits  COMPREHENSIVE METABOLIC PANEL - Abnormal; Notable for the following components:   Glucose, Bld  120 (*)    Creatinine, Ser 1.29 (*)    Albumin 3.3 (*)    ALT 14 (*)    GFR calc non Af Amer 47 (*)    GFR calc Af Amer 55 (*)    All other components within normal limits  URINALYSIS, ROUTINE W REFLEX MICROSCOPIC - Abnormal; Notable for the following components:   APPearance HAZY (*)    Hgb urine dipstick SMALL (*)    Ketones, ur 5 (*)    Protein, ur 30 (*)    Bacteria, UA RARE (*)    All other components within normal limits  INFLUENZA PANEL BY PCR (TYPE A & B) - Abnormal; Notable for the following components:   Influenza A By PCR POSITIVE (*)    All other components within normal limits  BASIC METABOLIC PANEL - Abnormal; Notable for the following components:   Potassium 3.0 (*)    Glucose, Bld 141 (*)    Calcium 8.5 (*)    GFR calc non Af Amer 51 (*)    GFR calc Af Amer 59 (*)    All other components within normal limits  CBC WITH DIFFERENTIAL/PLATELET - Abnormal; Notable for the following components:   WBC 3.1 (*)    Hemoglobin 12.5 (*)    RDW 16.0 (*)    Neutro Abs 1.6 (*)    All other components within normal limits  I-STAT CG4 LACTIC ACID, ED - Abnormal; Notable for the following components:   Lactic Acid, Venous 2.11 (*)    All other components within normal limits  CBG MONITORING, ED - Abnormal; Notable for the following  components:   Glucose-Capillary 115 (*)    All other components within normal limits  CULTURE, BLOOD (ROUTINE X 2)  CULTURE, BLOOD (ROUTINE X 2)  I-STAT TROPONIN, ED  I-STAT CG4 LACTIC ACID, ED    EKG  EKG Interpretation  Date/Time:  Wednesday September 19 2017 23:11:48 EST Ventricular Rate:  98 PR Interval:    QRS Duration: 108 QT Interval:  417 QTC Calculation: 475 R Axis:   -38 Text Interpretation:  Atrial fibrillation Ventricular premature complex Left axis deviation Abnormal R-wave progression, early transition Repol abnrm suggests ischemia, diffuse leads Confirmed by Quintella Reichert [phone]) on 09/20/2017 12:08:13 AM Also confirmed by Quintella Reichert [phone]), editor Hattie Perch [phone])  on 09/20/2017 8:05:19 AM       Radiology Dg Chest 2 View  Result Date: 09/19/2017 CLINICAL DATA:  Short of breath with fever EXAM: CHEST  2 VIEW COMPARISON:  06/21/2017, 01/19/2017, 09/19/2016 FINDINGS: Complete opacification of the left thorax with mild shift of mediastinal contents to the right. The right lung is grossly clear. Cardiomediastinal silhouette obscured. Right-sided vascular congestion. No pneumothorax. IMPRESSION: Stable appearance of completely opacified left thorax. No acute interval changes. Electronically Signed   By: Donavan Foil M.D.   On: 09/19/2017 23:47    Procedures Procedures (including critical care time)  Medications Ordered in ED Medications  amLODipine (NORVASC) tablet 10 mg (not administered)  apixaban (ELIQUIS) tablet 5 mg (not administered)  aspirin EC tablet 81 mg (not administered)  cloNIDine (CATAPRES) tablet 0.1 mg (not administered)  haloperidol (HALDOL) tablet 1 mg (not administered)  rosuvastatin (CRESTOR) tablet 20 mg (not administered)  senna-docusate (Senokot-S) tablet 1 tablet (not administered)  traZODone (DESYREL) tablet 50 mg (not administered)  acetaminophen (TYLENOL) tablet 650 mg (not administered)    Or  acetaminophen (TYLENOL)  suppository 650 mg (not administered)  HYDROcodone-acetaminophen (NORCO/VICODIN) 5-325 MG per tablet 1-2 tablet (  not administered)  ketorolac (TORADOL) 15 MG/ML injection 15 mg (not administered)  ondansetron (ZOFRAN) tablet 4 mg (not administered)    Or  ondansetron (ZOFRAN) injection 4 mg (not administered)  oseltamivir (TAMIFLU) capsule 30 mg (not administered)  albuterol (PROVENTIL) (2.5 MG/3ML) 0.083% nebulizer solution 2.5 mg (2.5 mg Nebulization Given 09/20/17 0659)  hydrALAZINE (APRESOLINE) injection 10 mg (10 mg Intravenous Given 09/20/17 0657)  acetaminophen (TYLENOL) tablet 650 mg (650 mg Oral Given 09/20/17 0120)  sodium chloride 0.9 % bolus 250 mL (0 mLs Intravenous Stopped 09/20/17 0254)  oseltamivir (TAMIFLU) capsule 30 mg (30 mg Oral Given 09/20/17 0345)     Initial Impression / Assessment and Plan / ED Course  I have reviewed the triage vital signs and the nursing notes.  Pertinent labs & imaging results that were available during my care of the patient were reviewed by me and considered in my medical decision making (see chart for details).     Pt with hx/o mesothelioma here for evaluation of sob, cough, fever.  He is tachypneic in ED with no hypoxia, no evidence of pna.  Flu A is positive.  Given patient's decreased lung capacity plan to admit for observation.  Hospitalist consulted for admission.    Final Clinical Impressions(s) / ED Diagnoses   Final diagnoses:  Influenza A    ED Discharge Orders    None       Quintella Reichert, MD 09/20/17 [phone]

## 2017-09-20 ENCOUNTER — Encounter (HOSPITAL_COMMUNITY): Payer: Self-pay | Admitting: Family Medicine

## 2017-09-20 DIAGNOSIS — I10 Essential (primary) hypertension: Secondary | ICD-10-CM | POA: Diagnosis not present

## 2017-09-20 DIAGNOSIS — J101 Influenza due to other identified influenza virus with other respiratory manifestations: Secondary | ICD-10-CM | POA: Diagnosis present

## 2017-09-20 DIAGNOSIS — I482 Chronic atrial fibrillation: Secondary | ICD-10-CM | POA: Diagnosis not present

## 2017-09-20 DIAGNOSIS — C450 Mesothelioma of pleura: Secondary | ICD-10-CM | POA: Diagnosis not present

## 2017-09-20 DIAGNOSIS — I69359 Hemiplegia and hemiparesis following cerebral infarction affecting unspecified side: Secondary | ICD-10-CM | POA: Diagnosis not present

## 2017-09-20 LAB — BASIC METABOLIC PANEL
Anion gap: 13 (ref 5–15)
BUN: 15 mg/dL (ref 6–20)
CHLORIDE: 101 mmol/L (ref 101–111)
CO2: 23 mmol/L (ref 22–32)
CREATININE: 1.21 mg/dL (ref 0.61–1.24)
Calcium: 8.5 mg/dL — ABNORMAL LOW (ref 8.9–10.3)
GFR, EST AFRICAN AMERICAN: 59 mL/min — AB (ref >60)
GFR, EST NON AFRICAN AMERICAN: 51 mL/min — AB (ref >60)
Glucose, Bld: 141 mg/dL — ABNORMAL HIGH (ref 65–99)
POTASSIUM: 3 mmol/L — AB (ref 3.5–5.1)
SODIUM: 137 mmol/L (ref 135–145)

## 2017-09-20 LAB — COMPREHENSIVE METABOLIC PANEL
ALK PHOS: 67 U/L (ref 38–126)
ALT: 14 U/L — AB (ref 17–63)
AST: 15 U/L (ref 15–41)
Albumin: 3.3 g/dL — ABNORMAL LOW (ref 3.5–5.0)
Anion gap: 15 (ref 5–15)
BILIRUBIN TOTAL: 0.7 mg/dL (ref 0.3–1.2)
BUN: 15 mg/dL (ref 6–20)
CO2: 22 mmol/L (ref 22–32)
CREATININE: 1.29 mg/dL — AB (ref 0.61–1.24)
Calcium: 9 mg/dL (ref 8.9–10.3)
Chloride: 103 mmol/L (ref 101–111)
GFR calc Af Amer: 55 mL/min — ABNORMAL LOW (ref >60)
GFR, EST NON AFRICAN AMERICAN: 47 mL/min — AB (ref >60)
GLUCOSE: 120 mg/dL — AB (ref 65–99)
POTASSIUM: 3.5 mmol/L (ref 3.5–5.1)
Sodium: 140 mmol/L (ref 135–145)
TOTAL PROTEIN: 7.4 g/dL (ref 6.5–8.1)

## 2017-09-20 LAB — URINALYSIS, ROUTINE W REFLEX MICROSCOPIC
Bilirubin Urine: NEGATIVE
Glucose, UA: NEGATIVE mg/dL
Ketones, ur: 5 mg/dL — AB
Leukocytes, UA: NEGATIVE
Nitrite: NEGATIVE
PH: 5 (ref 5.0–8.0)
Protein, ur: 30 mg/dL — AB
SPECIFIC GRAVITY, URINE: 1.024 (ref 1.005–1.030)
SQUAMOUS EPITHELIAL / LPF: NONE SEEN

## 2017-09-20 LAB — CBG MONITORING, ED: GLUCOSE-CAPILLARY: 115 mg/dL — AB (ref 65–99)

## 2017-09-20 LAB — CBC WITH DIFFERENTIAL/PLATELET
BASOS ABS: 0 10*3/uL (ref 0.0–0.1)
Basophils Relative: 0 %
EOS ABS: 0 10*3/uL (ref 0.0–0.7)
EOS PCT: 0 %
HCT: 39.4 % (ref 39.0–52.0)
HEMOGLOBIN: 12.5 g/dL — AB (ref 13.0–17.0)
LYMPHS ABS: 1 10*3/uL (ref 0.7–4.0)
Lymphocytes Relative: 31 %
MCH: 29.3 pg (ref 26.0–34.0)
MCHC: 31.7 g/dL (ref 30.0–36.0)
MCV: 92.3 fL (ref 78.0–100.0)
Monocytes Absolute: 0.6 10*3/uL (ref 0.1–1.0)
Monocytes Relative: 18 %
NEUTROS PCT: 51 %
Neutro Abs: 1.6 10*3/uL — ABNORMAL LOW (ref 1.7–7.7)
PLATELETS: 185 10*3/uL (ref 150–400)
RBC: 4.27 MIL/uL (ref 4.22–5.81)
RDW: 16 % — ABNORMAL HIGH (ref 11.5–15.5)
WBC: 3.1 10*3/uL — AB (ref 4.0–10.5)

## 2017-09-20 LAB — INFLUENZA PANEL BY PCR (TYPE A & B)
INFLAPCR: POSITIVE — AB
INFLBPCR: NEGATIVE

## 2017-09-20 MED ORDER — HYDROCODONE-ACETAMINOPHEN 5-325 MG PO TABS: 1.0000 | ORAL_TABLET | ORAL | Status: DC | PRN

## 2017-09-20 MED ORDER — APIXABAN 5 MG PO TABS
5.0000 mg | ORAL_TABLET | Freq: Two times a day (BID) | ORAL | Status: DC
  Administered 2017-09-20 – 2017-09-22 (×5): 5 mg via ORAL
  Filled 2017-09-20 (×5): qty 1

## 2017-09-20 MED ORDER — OSELTAMIVIR PHOSPHATE 30 MG PO CAPS
30.0000 mg | ORAL_CAPSULE | Freq: Once | ORAL | Status: AC
  Administered 2017-09-20: 30 mg via ORAL
  Filled 2017-09-20: qty 1

## 2017-09-20 MED ORDER — TRAZODONE HCL 50 MG PO TABS
50.0000 mg | ORAL_TABLET | Freq: Every day | ORAL | Status: DC
  Administered 2017-09-20 – 2017-09-21 (×2): 50 mg via ORAL
  Filled 2017-09-20 (×2): qty 1

## 2017-09-20 MED ORDER — ACETAMINOPHEN 325 MG PO TABS: 650.0000 mg | ORAL_TABLET | Freq: Four times a day (QID) | ORAL | Status: DC | PRN

## 2017-09-20 MED ORDER — HYDRALAZINE HCL 20 MG/ML IJ SOLN
10.0000 mg | INTRAMUSCULAR | Status: DC | PRN
  Administered 2017-09-20 (×2): 10 mg via INTRAVENOUS
  Filled 2017-09-20 (×2): qty 1

## 2017-09-20 MED ORDER — AMLODIPINE BESYLATE 10 MG PO TABS
10.0000 mg | ORAL_TABLET | Freq: Every day | ORAL | Status: DC
  Administered 2017-09-20 – 2017-09-22 (×3): 10 mg via ORAL
  Filled 2017-09-20: qty 1
  Filled 2017-09-20: qty 2
  Filled 2017-09-20: qty 1

## 2017-09-20 MED ORDER — SODIUM CHLORIDE 0.9 % IV BOLUS (SEPSIS)
250.0000 mL | Freq: Once | INTRAVENOUS | Status: AC
  Administered 2017-09-20: 250 mL via INTRAVENOUS

## 2017-09-20 MED ORDER — ONDANSETRON HCL 4 MG/2ML IJ SOLN: 4.0000 mg | Freq: Four times a day (QID) | INTRAMUSCULAR | Status: DC | PRN

## 2017-09-20 MED ORDER — OSELTAMIVIR PHOSPHATE 30 MG PO CAPS
30.0000 mg | ORAL_CAPSULE | Freq: Two times a day (BID) | ORAL | Status: DC
  Administered 2017-09-20 – 2017-09-22 (×4): 30 mg via ORAL
  Filled 2017-09-20 (×4): qty 1

## 2017-09-20 MED ORDER — CLONIDINE HCL 0.1 MG PO TABS
0.1000 mg | ORAL_TABLET | Freq: Two times a day (BID) | ORAL | Status: DC
  Administered 2017-09-20 – 2017-09-22 (×5): 0.1 mg via ORAL
  Filled 2017-09-20 (×5): qty 1

## 2017-09-20 MED ORDER — HALOPERIDOL 1 MG PO TABS: 1.0000 mg | ORAL_TABLET | Freq: Three times a day (TID) | ORAL | Status: DC | PRN

## 2017-09-20 MED ORDER — SENNOSIDES-DOCUSATE SODIUM 8.6-50 MG PO TABS: 1.0000 | ORAL_TABLET | Freq: Every evening | ORAL | Status: DC | PRN

## 2017-09-20 MED ORDER — ONDANSETRON HCL 4 MG PO TABS: 4.0000 mg | ORAL_TABLET | Freq: Four times a day (QID) | ORAL | Status: DC | PRN

## 2017-09-20 MED ORDER — ALBUTEROL SULFATE (2.5 MG/3ML) 0.083% IN NEBU
2.5000 mg | INHALATION_SOLUTION | RESPIRATORY_TRACT | Status: DC | PRN
  Administered 2017-09-20 – 2017-09-21 (×3): 2.5 mg via RESPIRATORY_TRACT
  Filled 2017-09-20 (×3): qty 3

## 2017-09-20 MED ORDER — KETOROLAC TROMETHAMINE 15 MG/ML IJ SOLN: 15.0000 mg | Freq: Four times a day (QID) | INTRAMUSCULAR | Status: DC | PRN

## 2017-09-20 MED ORDER — ROSUVASTATIN CALCIUM 20 MG PO TABS
20.0000 mg | ORAL_TABLET | Freq: Every day | ORAL | Status: DC
  Administered 2017-09-20 – 2017-09-21 (×2): 20 mg via ORAL
  Filled 2017-09-20 (×2): qty 1

## 2017-09-20 MED ORDER — ASPIRIN EC 81 MG PO TBEC
81.0000 mg | DELAYED_RELEASE_TABLET | Freq: Every day | ORAL | Status: DC
  Administered 2017-09-20 – 2017-09-22 (×3): 81 mg via ORAL
  Filled 2017-09-20 (×3): qty 1

## 2017-09-20 MED ORDER — ACETAMINOPHEN 650 MG RE SUPP: 650.0000 mg | Freq: Four times a day (QID) | RECTAL | Status: DC | PRN

## 2017-09-20 NOTE — ED Notes (Signed)
Pt c/o increased WOB, RR 40, pt placed on 2L [place], reports eased WOB.

## 2017-09-20 NOTE — H&P (Signed)
History and Physical    Thomas [name] OZD:[id number] DOB: 09/08/1925 DOA: 09/19/2017  PCP: Marin Olp, MD   Patient coming from: Home  Chief Complaint: Respiratory distress, fever   HPI: Thomas [name] is a 84 y.o. male with medical history significant for history of strokes with residual expressive aphasia, atrial fibrillation on Eliquis, hypertension, and malignant mesothelioma with chronic hypoxic respiratory failure, now presenting to the emergency department for evaluation of acute respiratory distress and fever.  He is accompanied by his daughters who assist with the history.  Patient had reportedly been in his usual state of health until yesterday when he was noted to have increased difficulty breathing with frequent cough, tachypnea, and accessory muscle use.  There has not been any sick contacts per se, but the patient attends an adult daycare and lives with his daughters, one who works in a nursing home, and the other in an elementary school.  ED Course: Upon arrival to the ED, patient is found to be febrile to 38.6 C, saturating well on his usual supplemental oxygen, tachypneic in the 30s, and with normal blood pressure.  EKG features and atrial fibrillation with PVCs and left axis deviation.  Chest x-ray is notable for chronic complete opacification of the left hemithorax, but no acute changes. Chemistry panel is notable for a stable creatinine at 1.29. CBC reveals a mild leukopenia with WBC 3,600. Influenza A is positive. Blood cultures were collected and he was treated with 250 cc NS, Tamiflu, and acetaminophen. He remains hemodynamically stable, dyspneic at rest, and will be observed on the medical-surgical unit for ongoing evaluation and management of fever and acute respiratory distress secondary to influenza A.   Review of Systems:  All other systems reviewed and apart from HPI, are negative.  Past Medical History:  Diagnosis Date  . Adenomatous colon polyp 2009  .  Allergy   . Atrial fibrillation ([place])   . Carotid artery occlusion   . Diabetes mellitus without complication ([place])   . Diverticulosis of colon (without mention of hemorrhage)   . ED (erectile dysfunction)   . Esophageal stricture   . Esophagitis   . GERD (gastroesophageal reflux disease)   . Goals of care, counseling/discussion 12/23/2016  . Hiatal hernia   . Hypertension   . Mesothelioma ([place])   . Prostate cancer ([place])   . Stroke ([place])    hx 7 strokes  . Unspecified gastritis and gastroduodenitis without mention of hemorrhage     Past Surgical History:  Procedure Laterality Date  . ENDARTERECTOMY Left 03/05/2013   Procedure: ENDARTERECTOMY CAROTID;  Surgeon: Conrad [place], MD;  Location: [facility];  Service: Vascular;  Laterality: Left;  . ESOPHAGOGASTRODUODENOSCOPY (EGD) WITH ESOPHAGEAL DILATION    . PROSTATE SURGERY     Laser surgery     reports that he quit smoking about 63 years ago. His smoking use included cigarettes. He has a 2.00 pack-year smoking history. he has never used smokeless tobacco. He reports that he does not drink alcohol or use drugs.  Allergies  Allergen Reactions  . Penicillins Anaphylaxis and Swelling    THROAT SWELLS Has patient had a PCN reaction causing immediate rash, facial/tongue/throat swelling, SOB or lightheadedness with hypotension: Yes Has patient had a PCN reaction causing severe rash involving mucus membranes or skin necrosis: No Has patient had a PCN reaction that required hospitalization: No Has patient had a PCN reaction occurring within the last 10 years: No If all of the above answers are "NO", then may  proceed with Cephalosporin use.   [name] Inhibitors Hives    Family History  Problem Relation Age of Onset  . Heart disease Father        unclear specifics  . Cancer Mother   . Breast cancer Sister   . Hyperlipidemia Daughter   . Hypertension Daughter   . Hypertension Son      Prior to Admission medications   Medication Sig  Start Date End Date Taking? Authorizing Provider  amLODipine (NORVASC) 10 MG tablet Take 1 tablet (10 mg total) by mouth daily. 08/29/17  Yes Marin Olp, MD  apixaban (ELIQUIS) 5 MG TABS tablet Take 1 tablet (5 mg total) by mouth 2 (two) times daily. 03/30/17  Yes Marin Olp, MD  aspirin EC 81 MG EC tablet Take 1 tablet (81 mg total) by mouth daily. 01/24/17  Yes Jakes, Clanford L, MD  cloNIDine (CATAPRES) 0.1 MG tablet Take 1 tablet (0.1 mg total) by mouth 2 (two) times daily. 01/23/17  Yes Renken, Clanford L, MD  haloperidol (HALDOL) 1 MG tablet TAKE 1 TABLET BY MOUTH ONCE AS NEEDED FOR AGITATION 05/18/17  Yes Marin Olp, MD  rosuvastatin (CRESTOR) 20 MG tablet Take 20 mg by mouth daily.   Yes [provider]  senna-docusate (SENOKOT-S) 8.6-50 MG tablet Take 1 tablet by mouth at bedtime as needed for mild constipation. 02/20/17  Yes Theodis Blaze, MD  traZODone (DESYREL) 50 MG tablet Take 50 mg by mouth at bedtime. 09/16/17  Yes [provider]    Physical Exam: Vitals:   09/20/17 0300 09/20/17 0315 09/20/17 0330 09/20/17 0345  BP: (!) 155/85 (!) 150/85 (!) 143/92   Pulse: 77 75 81   Resp: (!) 35 (!) 34 (!) 35   Temp:    (S) 99.1 F (37.3 C)  TempSrc:    Oral  SpO2: 95% 93% 94%       Constitutional: NAD, calm, tachypneic Eyes: PERTLA, lids and conjunctivae normal ENMT: Mucous membranes are moist. Posterior pharynx clear of any exudate or lesions.   Neck: normal, supple, no masses, no thyromegaly Respiratory: Markedly diminished breath sounds on left. Tachypnea. No pallor or cyanosis.  Cardiovascular: Rate ~80 and irregular. No significant JVD. Abdomen: No distension, no tenderness, no masses palpated. Bowel sounds normal.  Musculoskeletal: no clubbing / cyanosis. No joint deformity upper and lower extremities.  Skin: no significant rashes, lesions, ulcers. Warm, dry, well-perfused. Neurologic: CN 2-12 grossly intact. Expressive aphasia.    Psychiatric: Alert and oriented to person, place, and situation. Pleasant and cooperative.    Labs on Admission: I have personally reviewed following labs and imaging studies  CBC: Recent Labs  Lab 09/19/17 2317  WBC 3.6*  HGB 13.4  HCT 41.9  MCV 93.7  PLT 709   Basic Metabolic Panel: Recent Labs  Lab 09/19/17 2317  NA 140  K 3.5  CL 103  CO2 22  GLUCOSE 120*  BUN 15  CREATININE 1.29*  CALCIUM 9.0   GFR: CrCl cannot be calculated (Unknown ideal weight.). Liver Function Tests: Recent Labs  Lab 09/19/17 2317  AST 15  ALT 14*  ALKPHOS 67  BILITOT 0.7  PROT 7.4  ALBUMIN 3.3*   No results for input(s): LIPASE, AMYLASE in the last 168 hours. No results for input(s): AMMONIA in the last 168 hours. Coagulation Profile: No results for input(s): INR, PROTIME in the last 168 hours. Cardiac Enzymes: No results for input(s): CKTOTAL, CKMB, CKMBINDEX, TROPONINI in the last 168 hours. BNP (  last 3 results) No results for input(s): PROBNP in the last 8760 hours. HbA1C: No results for input(s): HGBA1C in the last 72 hours. CBG: No results for input(s): GLUCAP in the last 168 hours. Lipid Profile: No results for input(s): CHOL, HDL, LDLCALC, TRIG, CHOLHDL, LDLDIRECT in the last 72 hours. Thyroid Function Tests: No results for input(s): TSH, T4TOTAL, FREET4, T3FREE, THYROIDAB in the last 72 hours. Anemia Panel: No results for input(s): VITAMINB12, FOLATE, FERRITIN, TIBC, IRON, RETICCTPCT in the last 72 hours. Urine analysis:    Component Value Date/Time   COLORURINE YELLOW 02/15/2017 2152   APPEARANCEUR HAZY (A) 02/15/2017 2152   LABSPEC 1.021 02/15/2017 2152   PHURINE 5.0 02/15/2017 2152   GLUCOSEU NEGATIVE 02/15/2017 2152   HGBUR SMALL (A) 02/15/2017 2152   HGBUR negative 04/20/2010 0000   BILIRUBINUR NEGATIVE 02/15/2017 2152   BILIRUBINUR n 02/12/2017 1609   KETONESUR 5 (A) 02/15/2017 2152   PROTEINUR 30 (A) 02/15/2017 2152   UROBILINOGEN 1.0 02/12/2017 1609    UROBILINOGEN 1.0 07/21/2013 1825   NITRITE POSITIVE (A) 02/15/2017 2152   LEUKOCYTESUR LARGE (A) 02/15/2017 2152   Sepsis Labs: @LABRCNTIP (procalcitonin:4,lacticidven:4) )No results found for this or any previous visit (from the past 240 hour(s)).   Radiological Exams on Admission: Dg Chest 2 View  Result Date: 09/19/2017 CLINICAL DATA:  Short of breath with fever EXAM: CHEST  2 VIEW COMPARISON:  06/21/2017, 01/19/2017, 09/19/2016 FINDINGS: Complete opacification of the left thorax with mild shift of mediastinal contents to the right. The right lung is grossly clear. Cardiomediastinal silhouette obscured. Right-sided vascular congestion. No pneumothorax. IMPRESSION: Stable appearance of completely opacified left thorax. No acute interval changes. Electronically Signed   By: Donavan Foil M.D.   On: 09/19/2017 23:47    EKG: Independently reviewed. Atrial fibrillation, PVC's.   Assessment/Plan   1. Influenza A with acute respiratory distress - Presents with fevers and acute respiratory distress  - Found to be febrile and tachypneic with leukopenia, stable CXR, and influenza A positive  - Treated in ED with small NS bolus, APAP, and Tamiflu  - Continue Tamiflu, maintain droplet precautions, continue supportive care with prn analgesia and antipyretics, prn nebs, gentle IVF hydration    2. Malignant mesothelioma; chronic hypoxic respiratory failure  - CXR appears stable  - Continue supplemental O2    3. Atrial fibrillation  - In rate-controlled a fib on admission  - CHADS-VASc at least 5 (age x2, CVA x2, HTN)  - Continue Eliquis    4. Hypertension  - BP at goal  - Continue clonidine and Norvasc    5. Hx of CVA with residual expressive aphasia  - No new deficits  - Continue ASA and statin    6. Insomnia  - Continue trazodone qHS   7. Agitation  - Calm and cooperative at time of admission  - Continue prn Haldol     DVT prophylaxis: Eliquis  Code Status: DNR Family  Communication: Daughters updated at bedside Disposition Plan: Observe on med-surg Consults called: None Admission status: Observation    Vianne Bulls, MD [facility] Pager [phone]  If 7PM-7AM, please contact night-coverage [email or web address] Password [email or web address]  09/20/2017, 4:03 AM

## 2017-09-20 NOTE — Progress Notes (Signed)
PROGRESS NOTE    [name]   WGN:[id number]  DOB: 03-Sep-1925  DOA: 09/19/2017 PCP: Marin Olp, MD   Brief Narrative:  Thomas [name] is a 84 y.o. male with medical history significant for history of strokes with residual expressive aphasia, atrial fibrillation on Eliquis, hypertension malignant mesothelioma with chronic hypoxic respiratory failure who presents to the hospital for acute shortness of breath and a fever. He is found to have Influenza. He is followed at home by Hospice.    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   Essential hypertension - on Clonidine and Norvasc which are being continued  DVT prophylaxis: Eliquis Code Status: DNR Family Communication: daughter Disposition Plan: home when stable Consultants:    Procedures:    Antimicrobials:  Anti-infectives (From admission, onward)   Start     Dose/Rate Route Frequency Ordered Stop   09/20/17 1800  oseltamivir (TAMIFLU) capsule 30 mg     30 mg Oral Every 12 hours 09/20/17 0403 09/25/17 0559   09/20/17 0300  oseltamivir (TAMIFLU) capsule 30 mg     30 mg Oral  Once 09/20/17 0252 09/20/17 0345       Objective: Vitals:   09/20/17 0600 09/20/17 0657 09/20/17 0715 09/20/17 0720  BP: (!) 166/99 (!) 160/94 136/83   Pulse: 85  81   Resp: (!) 36  (!) 28   Temp:      TempSrc:      SpO2: 94%  95% 95%  Weight:      Height:        Intake/Output Summary (Last 24 hours) at 09/20/2017 1043 Last data filed at  09/20/2017 0254 Gross per 24 hour  Intake 750 ml  Output -  Net 750 ml   Filed Weights   09/20/17 0400  Weight: 71.8 kg (158 lb 4.6 oz)    Examination: General exam: Appears comfortable  HEENT: PERRLA, oral mucosa moist, no sclera icterus or thrush Respiratory system: Clear to auscultation. Respiratory effort normal. Cardiovascular system: S1 & S2 heard, RRR.  No murmurs  Gastrointestinal system: Abdomen soft, non-tender, nondistended. Normal bowel sound. No organomegaly Central nervous system: Alert and oriented. No focal neurological deficits. Extremities: No cyanosis, clubbing or edema Skin: No rashes or ulcers Psychiatry:  Mood & affect appropriate.     Data Reviewed: I have personally reviewed following labs and imaging studies  CBC: Recent Labs  Lab 09/19/17 2317 09/20/17 0430  WBC 3.6* 3.1*  NEUTROABS  --  1.6*  HGB 13.4 12.5*  HCT 41.9 39.4  MCV 93.7 92.3  PLT 192 784   Basic Metabolic Panel: Recent Labs  Lab 09/19/17 2317 09/20/17 0430  NA 140 137  K 3.5 3.0*  CL 103 101  CO2 22 23  GLUCOSE 120* 141*  BUN 15 15  CREATININE 1.29* 1.21  CALCIUM 9.0 8.5*   GFR: Estimated Creatinine Clearance: 42 mL/min (by C-G formula based on SCr of 1.21 mg/dL). Liver Function Tests: Recent  Labs  Lab 09/19/17 2317  AST 15  ALT 14*  ALKPHOS 67  BILITOT 0.7  PROT 7.4  ALBUMIN 3.3*   No results for input(s): LIPASE, AMYLASE in the last 168 hours. No results for input(s): AMMONIA in the last 168 hours. Coagulation Profile: No results for input(s): INR, PROTIME in the last 168 hours. Cardiac Enzymes: No results for input(s): CKTOTAL, CKMB, CKMBINDEX, TROPONINI in the last 168 hours. BNP (last 3 results) No results for input(s): PROBNP in the last 8760 hours. HbA1C: No results for input(s): HGBA1C in the last 72 hours. CBG: Recent Labs  Lab 09/20/17 0556  GLUCAP 115*   Lipid Profile: No results for input(s): CHOL, HDL, LDLCALC, TRIG, CHOLHDL, LDLDIRECT  in the last 72 hours. Thyroid Function Tests: No results for input(s): TSH, T4TOTAL, FREET4, T3FREE, THYROIDAB in the last 72 hours. Anemia Panel: No results for input(s): VITAMINB12, FOLATE, FERRITIN, TIBC, IRON, RETICCTPCT in the last 72 hours. Urine analysis:    Component Value Date/Time   COLORURINE YELLOW 09/20/2017 0531   APPEARANCEUR HAZY (A) 09/20/2017 0531   LABSPEC 1.024 09/20/2017 0531   PHURINE 5.0 09/20/2017 0531   GLUCOSEU NEGATIVE 09/20/2017 0531   HGBUR SMALL (A) 09/20/2017 0531   HGBUR negative 04/20/2010 0000   BILIRUBINUR NEGATIVE 09/20/2017 0531   BILIRUBINUR n 02/12/2017 1609   KETONESUR 5 (A) 09/20/2017 0531   PROTEINUR 30 (A) 09/20/2017 0531   UROBILINOGEN 1.0 02/12/2017 1609   UROBILINOGEN 1.0 07/21/2013 1825   NITRITE NEGATIVE 09/20/2017 0531   LEUKOCYTESUR NEGATIVE 09/20/2017 0531   Sepsis Labs: @LABRCNTIP (procalcitonin:4,lacticidven:4) )No results found for this or any previous visit (from the past 240 hour(s)).       Radiology Studies: Dg Chest 2 View  Result Date: 09/19/2017 CLINICAL DATA:  Short of breath with fever EXAM: CHEST  2 VIEW COMPARISON:  06/21/2017, 01/19/2017, 09/19/2016 FINDINGS: Complete opacification of the left thorax with mild shift of mediastinal contents to the right. The right lung is grossly clear. Cardiomediastinal silhouette obscured. Right-sided vascular congestion. No pneumothorax. IMPRESSION: Stable appearance of completely opacified left thorax. No acute interval changes. Electronically Signed   By: Donavan Foil M.D.   On: 09/19/2017 23:47      Scheduled Meds: . amLODipine  10 mg Oral Daily  . apixaban  5 mg Oral BID  . aspirin EC  81 mg Oral Daily  . cloNIDine  0.1 mg Oral BID  . oseltamivir  30 mg Oral Q12H  . rosuvastatin  20 mg Oral q1800  . traZODone  50 mg Oral QHS   Continuous Infusions:   LOS: 0 days    Time spent in minutes: 35    Debbe Odea, MD [facility] Pager: [email or web address] Password [facility] 09/20/2017, 10:43 AM

## 2017-09-20 NOTE — ED Notes (Signed)
Pt ate lunch resting in bed another daughter at bedside

## 2017-09-20 NOTE — ED Notes (Signed)
Patient was transferred over to a Hospial bed from a stretcher.

## 2017-09-20 NOTE — ED Notes (Signed)
Received  Pt from pod b, pt on droplet precautions, daughter in room with pt has on mask

## 2017-09-20 NOTE — Progress Notes (Addendum)
[place] [facility] Liaison:  RN visit  Met with patient and daughter at bedside in the ED.  Patient was sitting up in bed and watching tv.  Patient alert and oriented to time, situation and place.  Patient denies any pain or needs at this time.  Plan for patient is to be admitted to floor upon bed availability and treatment of Flu A and symptoms.    We will continue to follow while hospitalized.   Thank you.   Edyth Gunnels, RN, BSN [facility] Liaison [phone]  All hospital liaisons are now on [place].

## 2017-09-20 NOTE — Progress Notes (Signed)
[place] ([facility]) GIP RN Visit  This is a related and covered GIP admission of 09/20/17 with [facility] diagnosis of Cerebral Atherosclerosis per Dr. Tomasa Hosteller. Family contacted [facility] with concerns related to patient having problems ambulating, elevated BP, SOB and thick secretions. RN provided education re elevated BP, recommended pushing fluids to help thin secretions and offered RX for Mucinex and Robitussin. Family initiated transfer to hospital. [facility] was contacted by ED physician. Patient arrived to [facility] ED at 2256 on 09/19/17 and tested positive for flu A. Patient is admitted on med-surg unit for ongoing evaluation and management of fever and acute respiratory distress secondary to Flu A.   Symptoms being managed: Temp at 2321 on 09/19/17 was 101.4 and WBC 3.1. Most recent temp 99.1 after receiving Tylenol. BP 148/82 at 0000 on 09/20/17, most recent 135/75 at 0715 on 09/20/17 after receiving hydralazine. Respirations 36 at 0000 on 09/20/17, most recent 32 after receiving albuterol.   He is receiving IV fluids and being treated with TAMIFLU, first dose at 0345 09/20/17 . He received PRN Tylenol 650 mg at 0120 today, PRN albuterol at 0659 today and PRN hydralazine at 0657 today. He has PRN orders for Tylenol, Haldol, Hydrocodone-acetaminophen, Toradal, Zofran and Senokot.   Patient has Out of Facility DNR.   Hospital Length of Stay: 1  Discharge Planning: Plan to return to home once patient symptoms are more manageable. Per family request, they do not want patient to come home until no longer contagious, as wife is also an [facility] patient and they are afraid she will contract Flu and it will lead to her demise.   Communications to [facility]: Spoke with daughter at bedside. Wife, currently an [facility] patient also, is at home. Family wants to be the one to update her.   Communications to [facility]: Mariel Kansky, [place] team manager updated at 8:10 this am. Threasa Beards, CSW, updated on patient  admission. Dr. Tomasa Hosteller contacted regarding admission and if related.   Transfer summary and medication list to be placed on shadow chart. Dr. Yong Channel and Dr. Tomasa Hosteller advised of admission. [facility] will continue to follow patient while hospitalized and anticipate any discharge needs. Please call with hospice related questions or concerns.   Thank you,  Edyth Gunnels, RN, [facility] Liaison [phone]   [facility] liaisons are listed on [place].

## 2017-09-21 ENCOUNTER — Telehealth: Payer: Self-pay | Admitting: Family Medicine

## 2017-09-21 DIAGNOSIS — I69959 Hemiplegia and hemiparesis following unspecified cerebrovascular disease affecting unspecified side: Secondary | ICD-10-CM | POA: Diagnosis not present

## 2017-09-21 DIAGNOSIS — I69328 Other speech and language deficits following cerebral infarction: Secondary | ICD-10-CM | POA: Diagnosis not present

## 2017-09-21 DIAGNOSIS — I69320 Aphasia following cerebral infarction: Secondary | ICD-10-CM | POA: Diagnosis not present

## 2017-09-21 DIAGNOSIS — E1169 Type 2 diabetes mellitus with other specified complication: Secondary | ICD-10-CM | POA: Diagnosis not present

## 2017-09-21 DIAGNOSIS — F0391 Unspecified dementia with behavioral disturbance: Secondary | ICD-10-CM | POA: Diagnosis not present

## 2017-09-21 DIAGNOSIS — I10 Essential (primary) hypertension: Secondary | ICD-10-CM

## 2017-09-21 DIAGNOSIS — K219 Gastro-esophageal reflux disease without esophagitis: Secondary | ICD-10-CM | POA: Diagnosis present

## 2017-09-21 DIAGNOSIS — Z7901 Long term (current) use of anticoagulants: Secondary | ICD-10-CM | POA: Diagnosis not present

## 2017-09-21 DIAGNOSIS — I482 Chronic atrial fibrillation: Secondary | ICD-10-CM | POA: Diagnosis not present

## 2017-09-21 DIAGNOSIS — J9611 Chronic respiratory failure with hypoxia: Secondary | ICD-10-CM | POA: Diagnosis present

## 2017-09-21 DIAGNOSIS — I69359 Hemiplegia and hemiparesis following cerebral infarction affecting unspecified side: Secondary | ICD-10-CM | POA: Diagnosis not present

## 2017-09-21 DIAGNOSIS — Z79899 Other long term (current) drug therapy: Secondary | ICD-10-CM | POA: Diagnosis not present

## 2017-09-21 DIAGNOSIS — E785 Hyperlipidemia, unspecified: Secondary | ICD-10-CM | POA: Diagnosis not present

## 2017-09-21 DIAGNOSIS — J101 Influenza due to other identified influenza virus with other respiratory manifestations: Secondary | ICD-10-CM | POA: Diagnosis not present

## 2017-09-21 DIAGNOSIS — Z87891 Personal history of nicotine dependence: Secondary | ICD-10-CM | POA: Diagnosis not present

## 2017-09-21 DIAGNOSIS — R451 Restlessness and agitation: Secondary | ICD-10-CM | POA: Diagnosis not present

## 2017-09-21 DIAGNOSIS — C450 Mesothelioma of pleura: Secondary | ICD-10-CM | POA: Diagnosis not present

## 2017-09-21 DIAGNOSIS — Z66 Do not resuscitate: Secondary | ICD-10-CM | POA: Diagnosis present

## 2017-09-21 DIAGNOSIS — Z7982 Long term (current) use of aspirin: Secondary | ICD-10-CM | POA: Diagnosis not present

## 2017-09-21 DIAGNOSIS — Z9981 Dependence on supplemental oxygen: Secondary | ICD-10-CM | POA: Diagnosis not present

## 2017-09-21 DIAGNOSIS — Z8546 Personal history of malignant neoplasm of prostate: Secondary | ICD-10-CM | POA: Diagnosis not present

## 2017-09-21 DIAGNOSIS — E119 Type 2 diabetes mellitus without complications: Secondary | ICD-10-CM | POA: Diagnosis present

## 2017-09-21 DIAGNOSIS — G4700 Insomnia, unspecified: Secondary | ICD-10-CM | POA: Diagnosis present

## 2017-09-21 LAB — BASIC METABOLIC PANEL
Anion gap: 15 (ref 5–15)
BUN: 16 mg/dL (ref 6–20)
CO2: 23 mmol/L (ref 22–32)
CREATININE: 1.18 mg/dL (ref 0.61–1.24)
Calcium: 8.5 mg/dL — ABNORMAL LOW (ref 8.9–10.3)
Chloride: 100 mmol/L — ABNORMAL LOW (ref 101–111)
GFR calc Af Amer: >60 mL/min (ref >60)
GFR, EST NON AFRICAN AMERICAN: 53 mL/min — AB (ref >60)
GLUCOSE: 148 mg/dL — AB (ref 65–99)
POTASSIUM: 3 mmol/L — AB (ref 3.5–5.1)
SODIUM: 138 mmol/L (ref 135–145)

## 2017-09-21 MED ORDER — POTASSIUM CHLORIDE CRYS ER 20 MEQ PO TBCR
40.0000 meq | EXTENDED_RELEASE_TABLET | Freq: Once | ORAL | Status: AC
  Administered 2017-09-21: 40 meq via ORAL
  Filled 2017-09-21: qty 2

## 2017-09-21 NOTE — Care Management Obs Status (Signed)
[facility] NOTIFICATION   Patient Details  Name: Thomas [name] MRN: [id number] Date of Birth: 07-17-1926   Medicare Observation Status Notification Given:  Yes    Carles Collet, RN 09/21/2017, 10:49 AM

## 2017-09-21 NOTE — Progress Notes (Addendum)
[place] ([facility]) GIP RN Visit  This is a related and covered GIP admission of 09/20/17 with [facility] diagnosis of Cerebral Atherosclerosis per Dr. Tomasa Hosteller.   Patient is receiving inpatient treatment for FLU A.  Patient afebrile today.  Patient has receivied another dose of TAMIFLU and scheduled doses continue.  Patient still with a lot of coughing and "couldn't breathe good" and received albuterol (PROVENTIL) as a PRN dose at 0559am today.  Patient tolerated well.  Per Caryl Pina, bedside RN, patient is "looking, sounding and feeling better".    Day of hospital GIP LOS:  2  Patient has Out of Facility DNR.  No changes to goals of care:  Patient/family wish to treat the treatable only, no extensive treatments to prolong life is desired.    Discharge Planning: Plan to return to home once patient symptoms are more manageable. Per family request, they do not want patient to come home until no longer contagious, as wife is also an [facility] patient and they are afraid she will contract Flu and it will lead to her demise.  No discharge needs at this time per family/patient.    Communications to [facility]: Spoke with daughter, Ivin Booty, over the phone.  Daughter, Freda Munro, was at bedside this morning with patient.   Wife, currently an [facility] patient also, is at home.  Ivin Booty advised that wife is aware of patient condition and "just misses him, ready for him to come home because they are inseparable".  Offered emotional support to family.   Communications to [facility]: Mariel Kansky, [place] team manager updated at [phone] this am. I advised Threasa Beards [facility] CSW, of patient condition today at 0935 am.  Handoff/report for [facility] Liaison to be sent at end of day if patient is still hospitalized.  If patient discharges, on call will be notified.    If you have any hospice related questions or concerns, please feel free to contact [facility].  If patient is discharged home and requires transportation,  please use [facility] services as he is already on hospice services.    Thank you,  Edyth Gunnels, RN, [place] [facility] Liaison [phone]   [facility] liaisons are listed on [place].

## 2017-09-21 NOTE — Telephone Encounter (Signed)
Please be advised.   Copied from [place]. Topic: Inquiry >> Sep 20, 2017  6:09 PM Oliver Pila B wrote: Reason for CRM: hospice and palliative care called to inform the pt has been admitted to [place] hospital to room  [place]

## 2017-09-21 NOTE — Discharge Instructions (Signed)

## 2017-09-21 NOTE — Progress Notes (Signed)
PROGRESS NOTE    Artem Bunte   ION:[id number]  DOB: [birth year]  DOA: 09/19/2017 PCP: Marin Olp, MD   Brief Narrative:  Thomas [name] is a 84 y.o. male with medical history significant for history of strokes with residual expressive aphasia, atrial fibrillation on Eliquis, hypertension malignant mesothelioma with chronic hypoxic respiratory failure who presents to the hospital for acute shortness of breath and a fever. He is found to have Influenza. He is followed at home by Hospice.    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   Essential hypertension - on Clonidine and Norvasc which are being continued  DVT prophylaxis: Eliquis Code Status: DNR Family Communication: daughter Disposition Plan: home when stable Consultants:    Procedures:    Antimicrobials:  Anti-infectives (From admission, onward)   Start     Dose/Rate Route Frequency Ordered Stop   09/20/17 1800  oseltamivir (TAMIFLU) capsule 30 mg     30 mg Oral Every 12 hours 09/20/17 0403 09/25/17 0559   09/20/17 0300  oseltamivir (TAMIFLU) capsule 30 mg     30 mg Oral  Once 09/20/17 0252 09/20/17 0345       Objective: Vitals:   09/20/17 2125 09/21/17 0132 09/21/17 0522 09/21/17 0916  BP: (!) 177/95 (!) 154/82 118/81 126/76  Pulse: 80 72 79 73  Resp: (!) 30 (!) 28 (!) 30 20  Temp: 98.2 F (36.8 C) 99 F (37.2 C) 98.1 F (36.7 C) 98.1 F (36.7 C)  TempSrc: Oral Axillary Oral Oral  SpO2: 100% 98% 97%  100%  Weight:      Height:        Intake/Output Summary (Last 24 hours) at 09/21/2017 1220 Last data filed at 09/21/2017 2440 Gross per 24 hour  Intake 240 ml  Output 575 ml  Net -335 ml   Filed Weights   09/20/17 0400  Weight: 71.8 kg (158 lb 4.6 oz)    Examination: General exam: Appears comfortable  HEENT: PERRLA, oral mucosa moist, no sclera icterus or thrush Respiratory system: Clear to auscultation. Respiratory effort normal. Cardiovascular system: S1 & S2 heard, RRR.  No murmurs  Gastrointestinal system: Abdomen soft, non-tender, nondistended. Normal bowel sound. No organomegaly Central nervous system: Alert and oriented. No focal neurological deficits. Extremities: No cyanosis, clubbing or edema Skin: No rashes or ulcers Psychiatry:  Mood & affect appropriate.     Data Reviewed: I have personally reviewed following labs and imaging studies  CBC: Recent Labs  Lab 09/19/17 2317 09/20/17 0430  WBC 3.6* 3.1*  NEUTROABS  --  1.6*  HGB 13.4 12.5*  HCT 41.9 39.4  MCV 93.7 92.3  PLT 192 102   Basic Metabolic Panel: Recent Labs  Lab 09/19/17 2317 09/20/17 0430 09/21/17 0806  NA 140 137 138  K 3.5 3.0* 3.0*  CL 103 101 100*  CO2 22 23 23   GLUCOSE 120* 141* 148*  BUN 15 15 16   CREATININE 1.29* 1.21 1.18  CALCIUM 9.0 8.5* 8.5*   GFR: Estimated Creatinine Clearance: 43.1 mL/min (by C-G formula based on SCr of 1.18 mg/dL). Liver Function Tests: Recent Labs  Lab 09/19/17 2317  AST 15  ALT 14*  ALKPHOS 67  BILITOT 0.7  PROT 7.4  ALBUMIN 3.3*   No results for input(s): LIPASE, AMYLASE in the last 168 hours. No results for input(s): AMMONIA in the last 168 hours. Coagulation Profile: No results for input(s): INR, PROTIME in the last 168 hours. Cardiac Enzymes: No results for input(s): CKTOTAL, CKMB, CKMBINDEX, TROPONINI in the last 168 hours. BNP (last 3 results) No results for input(s): PROBNP in the last 8760 hours. HbA1C: No results for input(s):  HGBA1C in the last 72 hours. CBG: Recent Labs  Lab 09/20/17 0556  GLUCAP 115*   Lipid Profile: No results for input(s): CHOL, HDL, LDLCALC, TRIG, CHOLHDL, LDLDIRECT in the last 72 hours. Thyroid Function Tests: No results for input(s): TSH, T4TOTAL, FREET4, T3FREE, THYROIDAB in the last 72 hours. Anemia Panel: No results for input(s): VITAMINB12, FOLATE, FERRITIN, TIBC, IRON, RETICCTPCT in the last 72 hours. Urine analysis:    Component Value Date/Time   COLORURINE YELLOW 09/20/2017 0531   APPEARANCEUR HAZY (A) 09/20/2017 0531   LABSPEC 1.024 09/20/2017 0531   PHURINE 5.0 09/20/2017 0531   GLUCOSEU NEGATIVE 09/20/2017 0531   HGBUR SMALL (A) 09/20/2017 0531   HGBUR negative 04/20/2010 0000   BILIRUBINUR NEGATIVE 09/20/2017 0531   BILIRUBINUR n 02/12/2017 1609   KETONESUR 5 (A) 09/20/2017 0531   PROTEINUR 30 (A) 09/20/2017 0531   UROBILINOGEN 1.0 02/12/2017 1609   UROBILINOGEN 1.0 07/21/2013 1825   NITRITE NEGATIVE 09/20/2017 0531   LEUKOCYTESUR NEGATIVE 09/20/2017 0531   Sepsis Labs: @LABRCNTIP (procalcitonin:4,lacticidven:4) )No results found for this or any previous visit (from the past 240 hour(s)).       Radiology Studies: Dg Chest 2 View  Result Date: 09/19/2017 CLINICAL DATA:  Short of breath with fever EXAM: CHEST  2 VIEW COMPARISON:  06/21/2017, 01/19/2017, 09/19/2016 FINDINGS: Complete opacification of the left thorax with mild shift of mediastinal contents to the right. The right lung is grossly clear. Cardiomediastinal silhouette obscured. Right-sided vascular congestion. No pneumothorax. IMPRESSION: Stable appearance of completely opacified left thorax. No acute interval changes. Electronically Signed   By: Donavan Foil M.D.   On: 09/19/2017 23:47      Scheduled Meds: . amLODipine  10 mg Oral Daily  . apixaban  5 mg Oral BID  . aspirin EC  81 mg Oral Daily  . cloNIDine  0.1 mg Oral BID  . oseltamivir  30 mg Oral Q12H  . rosuvastatin  20 mg Oral q1800    . traZODone  50 mg Oral QHS   Continuous Infusions:   LOS: 0 days    Time spent in minutes: 35    Debbe Odea, MD [facility] Pager: [email or web address] Password [email or web address] 09/21/2017, 12:20 PM

## 2017-09-21 NOTE — Progress Notes (Signed)
Ambulated pt down hallway. Tolerated well. O2 is 93% on Room Air.

## 2017-09-22 ENCOUNTER — Other Ambulatory Visit: Payer: Self-pay

## 2017-09-22 DIAGNOSIS — E1169 Type 2 diabetes mellitus with other specified complication: Secondary | ICD-10-CM

## 2017-09-22 DIAGNOSIS — E785 Hyperlipidemia, unspecified: Secondary | ICD-10-CM

## 2017-09-22 MED ORDER — OSELTAMIVIR PHOSPHATE 30 MG PO CAPS: 30.0000 mg | ORAL_CAPSULE | Freq: Two times a day (BID) | ORAL | 0 refills | Status: AC

## 2017-09-22 NOTE — Progress Notes (Signed)
Discharge instructions given. Pt being taken down by volunteer services.

## 2017-09-22 NOTE — Discharge Summary (Signed)
Physician Discharge Summary  Thomas [name] OIZ:[id number] DOB: 11/11/1925 DOA: 09/19/2017  PCP: Thomas Olp, MD  Admit date: 09/19/2017 Discharge date: 09/22/2017  Admitted From: home Disposition:  home   Recommendations for Outpatient Follow-up:  1. 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 No dyspnea at rest or with exertion. No other complaints today. Discussed discharge plans.   Brief Summary: Thomas [name] is a 84 y.o.malewith medical history significant forhistory of strokes with residual expressive aphasia, atrial fibrillation on Eliquis, hypertension malignant mesothelioma with chronic hypoxic respiratory failure who presents to the hospital for acute shortness of breath and a fever. He is found to have Influenza. He is followed at home by Hospice.     Hospital Course:  Principal Problem:   Influenza A - will continue Tamiflu - symptoms have resolved and he has been able to ambulate down the hall without noted hypoxia - pulse ox was 93% on room air- he will be discharged home today.   Active Problems:     Malignant mesothelioma of pleura  - on chronic O2- hospice follows at home    Permanent atrial fibrillation   - CHA2DS2-VASc Score 5 - cont Eliquis  - rate is controlled    Dementia with behavioral disturbance    Hemiparesis and speech and language deficit as late effects of stroke   Essential hypertension - on Clonidine and Norvasc which are being continued     Discharge  Exam: Vitals:   09/22/17 0538 09/22/17 0925  BP: 129/77 122/73  Pulse: 67 76  Resp: 18 18  Temp: 98.1 F (36.7 C) 98.2 F (36.8 C)  SpO2: 96% 95%   Vitals:   09/21/17 2134 09/22/17 0136 09/22/17 0538 09/22/17 0925  BP: 121/81 118/76 129/77 122/73  Pulse: 76 60 67 76  Resp: 20 18 18 18   Temp: 98.1 F (36.7 C) 98.9 F (37.2 C) 98.1 F (36.7 C) 98.2 F (36.8 C)  TempSrc: Oral Oral Oral Oral  SpO2: 97% 96% 96% 95%  Weight:      Height:        General: Pt is alert, awake, not in acute distress Cardiovascular: RRR, S1/S2 +, no rubs, no gallops Respiratory: CTA bilaterally, no wheezing, no rhonchi Abdominal: Soft, NT, ND, bowel sounds + Extremities: no edema, no cyanosis   Discharge Instructions  Discharge Instructions    Diet general   Complete by:  As directed    Increase activity slowly   Complete by:  As directed      Allergies as of 09/22/2017      Reactions   Penicillins Anaphylaxis, Swelling   THROAT SWELLS Has patient had a PCN reaction causing immediate rash, facial/tongue/throat swelling, SOB or lightheadedness with hypotension: Yes Has patient had a PCN reaction causing severe rash involving mucus membranes or skin necrosis: No Has patient had a PCN reaction that required hospitalization: No Has patient had a PCN reaction occurring within the last 10 years: No If all of the above answers are "NO", then may proceed with  Cephalosporin use.   Ace Inhibitors Hives      Medication List    TAKE these medications   amLODipine 10 MG tablet Commonly known as:  NORVASC Take 1 tablet (10 mg total) by mouth daily.   apixaban 5 MG Tabs tablet Commonly known as:  ELIQUIS Take 1 tablet (5 mg total) by mouth 2 (two) times daily.   aspirin 81 MG EC tablet Take 1 tablet (81 mg total) by mouth daily.   cloNIDine 0.1 MG tablet Commonly known as:  CATAPRES Take 1 tablet (0.1 mg total) by mouth 2 (two) times daily.   haloperidol 1 MG tablet Commonly known as:   HALDOL TAKE 1 TABLET BY MOUTH ONCE AS NEEDED FOR AGITATION   oseltamivir 30 MG capsule Commonly known as:  TAMIFLU Take 1 capsule (30 mg total) by mouth every 12 (twelve) hours.   rosuvastatin 20 MG tablet Commonly known as:  CRESTOR Take 20 mg by mouth daily.   senna-docusate 8.6-50 MG tablet Commonly known as:  Senokot-S Take 1 tablet by mouth at bedtime as needed for mild constipation.   traZODone 50 MG tablet Commonly known as:  DESYREL Take 50 mg by mouth at bedtime.       Allergies  Allergen Reactions  . Penicillins Anaphylaxis and Swelling    THROAT SWELLS Has patient had a PCN reaction causing immediate rash, facial/tongue/throat swelling, SOB or lightheadedness with hypotension: Yes Has patient had a PCN reaction causing severe rash involving mucus membranes or skin necrosis: No Has patient had a PCN reaction that required hospitalization: No Has patient had a PCN reaction occurring within the last 10 years: No If all of the above answers are "NO", then may proceed with Cephalosporin use.   Thomas [name] Inhibitors Hives     Procedures/Studies:    Dg Chest 2 View  Result Date: 09/19/2017 CLINICAL DATA:  Short of breath with fever EXAM: CHEST  2 VIEW COMPARISON:  06/21/2017, 01/19/2017, 09/19/2016 FINDINGS: Complete opacification of the left thorax with mild shift of mediastinal contents to the right. The right lung is grossly clear. Cardiomediastinal silhouette obscured. Right-sided vascular congestion. No pneumothorax. IMPRESSION: Stable appearance of completely opacified left thorax. No acute interval changes. Electronically Signed   By: Donavan Foil M.D.   On: 09/19/2017 23:47     The results of significant diagnostics from this hospitalization (including imaging, microbiology, ancillary and laboratory) are listed below for reference.     Microbiology: Recent Results (from the past 240 hour(s))  Blood Culture (routine x 2)     Status: None (Preliminary result)    Collection Time: 09/19/17 11:17 PM  Result Value Ref Range Status   Specimen Description BLOOD LEFT FOREARM  Final   Special Requests IN PEDIATRIC BOTTLE Blood Culture adequate volume  Final   Culture   Final    NO GROWTH 1 DAY Performed at [facility], [address] [place], [place] [address]    Report Status PENDING  Incomplete  Blood Culture (routine x 2)     Status: None (Preliminary result)   Collection Time: 09/19/17 11:19 PM  Result Value Ref Range Status   Specimen Description RIGHT ANTECUBITAL  Final   Special Requests   Final    BOTTLES DRAWN AEROBIC AND ANAEROBIC Blood Culture adequate volume   Culture   Final    NO GROWTH 1 DAY Performed at [facility], [place] [address] [place], [place] [address]    Report Status PENDING  Incomplete  Labs: BNP (last 3 results) No results for input(s): BNP in the last 8760 hours. Basic Metabolic Panel: Recent Labs  Lab 09/19/17 2317 09/20/17 0430 09/21/17 0806  NA 140 137 138  K 3.5 3.0* 3.0*  CL 103 101 100*  CO2 22 23 23   GLUCOSE 120* 141* 148*  BUN 15 15 16   CREATININE 1.29* 1.21 1.18  CALCIUM 9.0 8.5* 8.5*   Liver Function Tests: Recent Labs  Lab 09/19/17 2317  AST 15  ALT 14*  ALKPHOS 67  BILITOT 0.7  PROT 7.4  ALBUMIN 3.3*   No results for input(s): LIPASE, AMYLASE in the last 168 hours. No results for input(s): AMMONIA in the last 168 hours. CBC: Recent Labs  Lab 09/19/17 2317 09/20/17 0430  WBC 3.6* 3.1*  NEUTROABS  --  1.6*  HGB 13.4 12.5*  HCT 41.9 39.4  MCV 93.7 92.3  PLT 192 185   Cardiac Enzymes: No results for input(s): CKTOTAL, CKMB, CKMBINDEX, TROPONINI in the last 168 hours. BNP: Invalid input(s): POCBNP CBG: Recent Labs  Lab 09/20/17 0556  GLUCAP 115*   D-Dimer No results for input(s): DDIMER in the last 72 hours. Hgb A1c No results for input(s): HGBA1C in the last 72 hours. Lipid Profile No results for input(s): CHOL, HDL, LDLCALC, TRIG, CHOLHDL, LDLDIRECT  in the last 72 hours. Thyroid function studies No results for input(s): TSH, T4TOTAL, T3FREE, THYROIDAB in the last 72 hours.  Invalid input(s): FREET3 Anemia work up No results for input(s): VITAMINB12, FOLATE, FERRITIN, TIBC, IRON, RETICCTPCT in the last 72 hours. Urinalysis    Component Value Date/Time   COLORURINE YELLOW 09/20/2017 0531   APPEARANCEUR HAZY (A) 09/20/2017 0531   LABSPEC 1.024 09/20/2017 0531   PHURINE 5.0 09/20/2017 0531   GLUCOSEU NEGATIVE 09/20/2017 0531   HGBUR SMALL (A) 09/20/2017 0531   HGBUR negative 04/20/2010 0000   BILIRUBINUR NEGATIVE 09/20/2017 0531   BILIRUBINUR n 02/12/2017 1609   KETONESUR 5 (A) 09/20/2017 0531   PROTEINUR 30 (A) 09/20/2017 0531   UROBILINOGEN 1.0 02/12/2017 1609   UROBILINOGEN 1.0 07/21/2013 1825   NITRITE NEGATIVE 09/20/2017 0531   LEUKOCYTESUR NEGATIVE 09/20/2017 0531   Sepsis Labs Invalid input(s): PROCALCITONIN,  WBC,  LACTICIDVEN Microbiology Recent Results (from the past 240 hour(s))  Blood Culture (routine x 2)     Status: None (Preliminary result)   Collection Time: 09/19/17 11:17 PM  Result Value Ref Range Status   Specimen Description BLOOD LEFT FOREARM  Final   Special Requests IN PEDIATRIC BOTTLE Blood Culture adequate volume  Final   Culture   Final    NO GROWTH 1 DAY Performed at [facility], [place] [address], [place], [place] [address]    Report Status PENDING  Incomplete  Blood Culture (routine x 2)     Status: None (Preliminary result)   Collection Time: 09/19/17 11:19 PM  Result Value Ref Range Status   Specimen Description RIGHT ANTECUBITAL  Final   Special Requests   Final    BOTTLES DRAWN AEROBIC AND ANAEROBIC Blood Culture adequate volume   Culture   Final    NO GROWTH 1 DAY Performed at [facility], [place] [address] [place], [place] [address]    Report Status PENDING  Incomplete     Time coordinating discharge: Over 30 minutes  SIGNED:   Debbe Odea, MD  [facility] 09/22/2017, 9:39 AM Pager   If 7PM-7AM, please contact night-coverage [email or web address] Password [email or web address]

## 2017-09-24 ENCOUNTER — Telehealth: Payer: Self-pay | Admitting: *Deleted

## 2017-09-24 NOTE — Telephone Encounter (Signed)
Per chart review: Admit date: 09/19/2017 Discharge date: 09/22/2017  Admitted From: home Disposition:  home   Recommendations for Outpatient Follow-up:  1. Home with hospice  Discharge Condition:  stable   CODE STATUS:  DNR   Consultations:  none    Discharge Diagnoses:  Principal Problem:   Influenza A Active Problems:   Essential hypertension   Malignant mesothelioma of pleura (HCC)   Permanent atrial fibrillation (HCC)   Type 2 diabetes mellitus with hyperlipidemia ([place])   Dementia with behavioral disturbance   Hemiparesis and speech and language deficit as late effects of stroke [facility])  ___________________________________________________________________________________ Transition Care Management Follow-up Telephone Call   Date discharged? 09/22/17   How have you been since you were released from the hospital? "good"   Do you understand why you were in the hospital? yes   Do you understand the discharge instructions? yes   Where were you discharged to? Home with hospice   Items Reviewed:  Medications reviewed: yes  Allergies reviewed: yes  Dietary changes reviewed: yes  Referrals reviewed: yes   Functional Questionnaire:   Activities of Daily Living (ADLs):   He states they are independent in the following: home with hospice States they require assistance with the following: home with hospice   Any transportation issues/concerns?: no   Any patient concerns? no   Confirmed importance and date/time of follow-up visits scheduled yes  Daughter declined follow up visit for now. She states the hospice nurse came out today and all of his vital signs were stable and he is doing well. She states the hospice nurse will be back out tomorrow to check on him again and if it is determined that he needs a hospital follow up visit then she will call to schedule it.  Confirmed with patient if condition begins to worsen call PCP or go to the ER.  Patient was  given the office number and encouraged to call back with question or concerns.  : yes

## 2017-09-24 NOTE — Telephone Encounter (Signed)
Attempted call to complete TCM. Left voicemail requesting call back.

## 2017-09-25 ENCOUNTER — Telehealth: Payer: Self-pay | Admitting: Family Medicine

## 2017-09-25 LAB — CULTURE, BLOOD (ROUTINE X 2)
CULTURE: NO GROWTH
CULTURE: NO GROWTH
Special Requests: ADEQUATE
Special Requests: ADEQUATE

## 2017-09-25 NOTE — Telephone Encounter (Signed)
Please advise 

## 2017-09-25 NOTE — Telephone Encounter (Signed)
Copied from [place] [phone]. Topic: Quick Communication - See Telephone Encounter >> Sep 25, 2017  5:21 PM Arletha Grippe wrote: CRM for notification. See Telephone encounter for:   09/25/17. Hospice nurse came to home today, checking vitals, and everything sounds good, daughter wanted to know if pt needs to come in for hospital follow up appt.   Cb# [phone] - Ivin Booty

## 2017-09-26 NOTE — Telephone Encounter (Signed)
im ok with no hospital follow up with hospice following. If they would prefer to come in to see me- certainly would want to make that happen though

## 2017-09-27 ENCOUNTER — Telehealth: Payer: Self-pay | Admitting: Family Medicine

## 2017-09-27 NOTE — Telephone Encounter (Signed)
Please advise 

## 2017-09-27 NOTE — Telephone Encounter (Signed)
Copied from [place]. Topic: Quick Communication - See Telephone Encounter >> Sep 27, 2017  4:30 PM [place], Laban Emperor wrote: CRM for notification. See Telephone encounter for: Kenney Houseman pt caregiver states pt attends [employer]. And they need a letter stating it is ok for the pt to return to the daycare from been discharge from the [facility]. On Saturday with the flu.   [employer] contact number [phone], fax [phone]  [facility] contact# [phone] ext [phone]  09/27/17.

## 2017-09-27 NOTE — Telephone Encounter (Signed)
Called and spoke to daughter Ivin Booty. She states that hospice will be back out today so she is comfortable with him staying home for now. She states that she will call if anything changes.

## 2017-09-28 NOTE — Telephone Encounter (Signed)
Letter faxed as requested

## 2017-10-17 ENCOUNTER — Telehealth: Payer: Self-pay | Admitting: Pulmonary Disease

## 2017-10-17 ENCOUNTER — Other Ambulatory Visit: Payer: Self-pay | Admitting: Family Medicine

## 2017-10-17 NOTE — Telephone Encounter (Signed)
Okay to cancel appointment. Hospice can call us on an as-needed basis from here on

## 2017-10-17 NOTE — Telephone Encounter (Signed)
Patients daughter is aware. Appointment cancelled nothing further needed.

## 2017-10-17 NOTE — Telephone Encounter (Signed)
RA- do you want him to keep this appt? He is in Hospice now. Please advise, thanks

## 2017-10-18 NOTE — Telephone Encounter (Signed)
We can fill but im also fine with hospice doing all refills and making adjustments in meds

## 2017-10-18 NOTE — Telephone Encounter (Signed)
Copied from [place]. Topic: Quick Communication - Rx Refill/Question >> Oct 18, 2017  4:20 PM Neva Seat wrote: Seroquel 25 mg  [facility] # [id number]  [facility] [phone]   Pt is needing refills for the Rx.  Can Dr. Yong Channel refill them or will he allow [facility] to fill the Rx?

## 2017-10-19 ENCOUNTER — Ambulatory Visit: Payer: Medicare Other | Admitting: Adult Health

## 2017-10-22 ENCOUNTER — Telehealth: Payer: Self-pay | Admitting: Family Medicine

## 2017-10-22 NOTE — Telephone Encounter (Signed)
noted 

## 2017-10-22 NOTE — Telephone Encounter (Signed)
Call placed to Thomas [name] who confirmed. Status changed in chart

## 2017-10-22 NOTE — Telephone Encounter (Signed)
Copied from [place] [phone]. Topic: General - Deceased Patient >> 2017/10/31  4:15 PM Lolita Rieger, [place] wrote: Reason for CRM: pt deceased 10-31-2017 @5 :83 a.m.

## 2017-11-12 NOTE — Telephone Encounter (Signed)
Called Tanya and left a voicemail message asking for a return  Phone call

## 2017-11-12 DEATH — deceased

## 2017-11-23 ENCOUNTER — Encounter (HOSPITAL_COMMUNITY): Payer: Medicare Other

## 2017-11-23 ENCOUNTER — Ambulatory Visit: Payer: Medicare Other | Admitting: Family

## 2017-11-30 ENCOUNTER — Encounter (HOSPITAL_COMMUNITY): Payer: Medicare Other

## 2017-11-30 ENCOUNTER — Ambulatory Visit: Payer: Medicare Other | Admitting: Family

## 2018-02-13 IMAGING — MR MR HEAD W/O CM
9 of 10 series · 36 of 48 positions shown · non-contrast
Comparison: 01/18/2017.  01/06/2017.

CLINICAL DATA: Altered mental status. Weakness. Dysarthria. Right
mid brain infarction and right thalamic infarction.

EXAM:
MRI HEAD WITHOUT CONTRAST
TECHNIQUE: Multiplanar, multiecho pulse sequences of the brain and surrounding
structures were obtained without intravenous contrast.

[Series 3: T1 · sagittal · 5.0mm · 0.47mm/px · 3 of 26 slices shown]
[im 1/26]
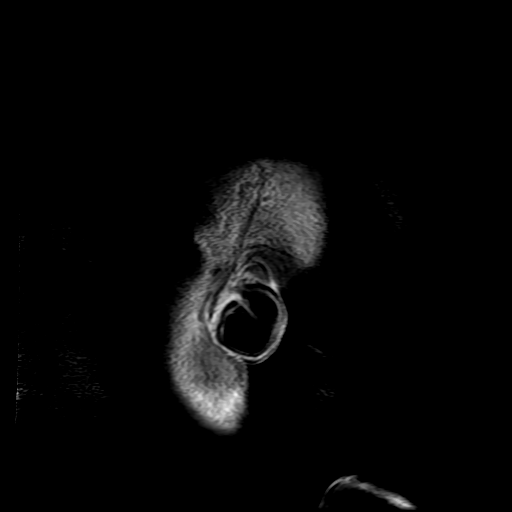
[im 13/26]
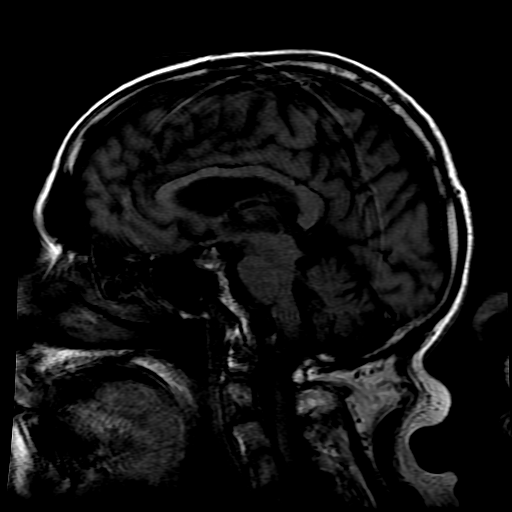
[im 26/26]
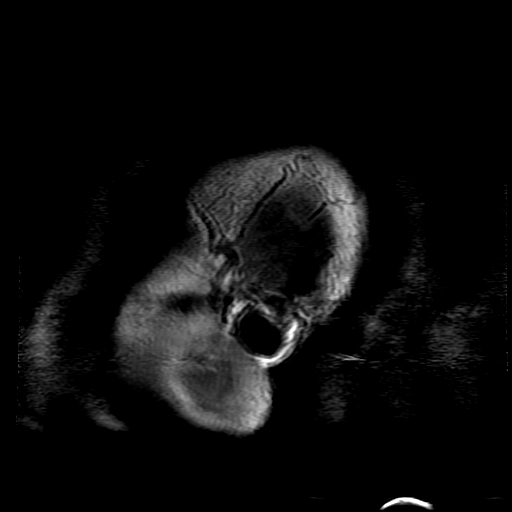

[Series 4: DWI · axial · 3.0mm · 1.09mm/px · z∈[-73,+70]mm · 8 of 98 slices shown (1 of 4)]
[im 1/98]
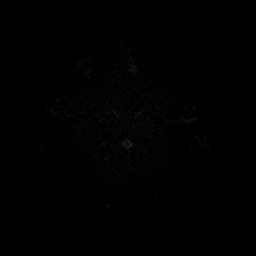
[im 11/98]
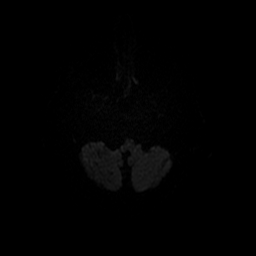
[im 33/98]
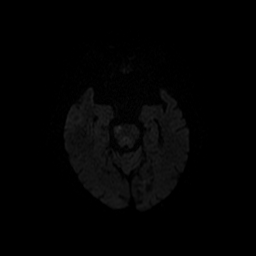
[im 44/98]
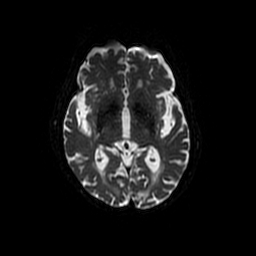
[im 54/98]
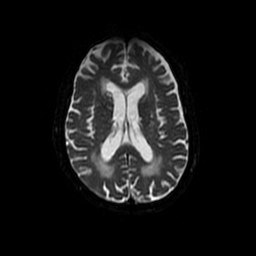
[im 65/98]
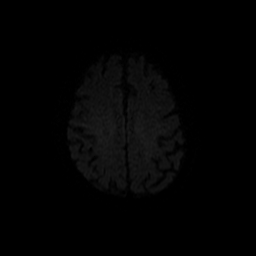
[im 87/98]
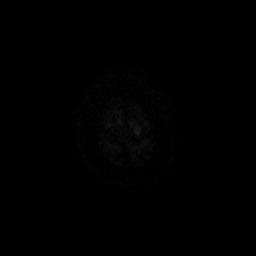
[im 98/98]
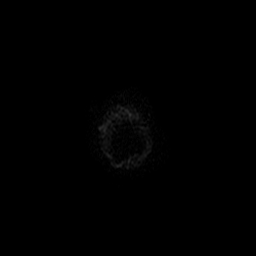

[Series 5: T2 · axial · 5.0mm · 0.47mm/px · z∈[-76,+66]mm · 2 of 25 slices shown (1 of 2)]
[im 1/25]
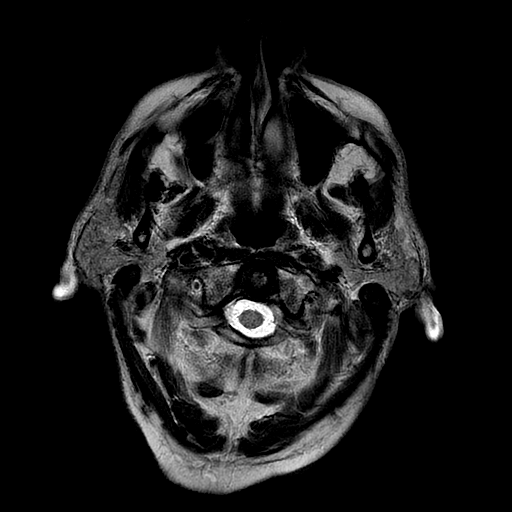
[im 25/25]
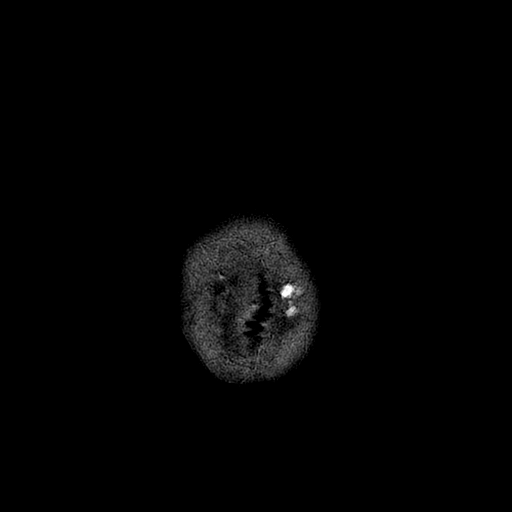

[Series 6: DWI · coronal · 5.0mm · 1.09mm/px · 7 of 76 slices shown (2 of 4)]
[im 1/76]
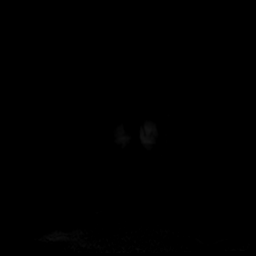
[im 13/76]
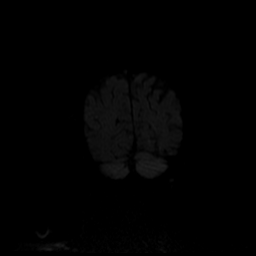
[im 26/76]
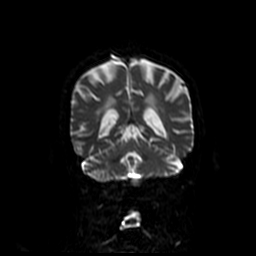
[im 38/76]
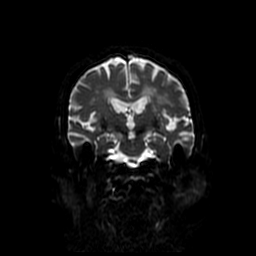
[im 51/76]
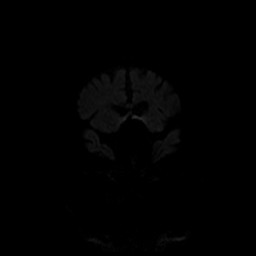
[im 63/76]
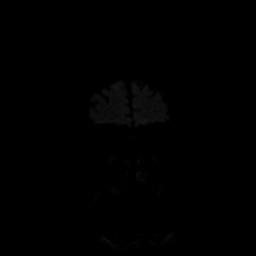
[im 76/76]
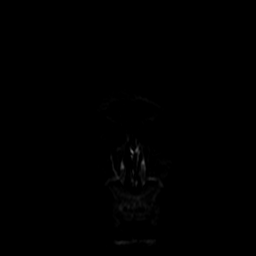

[Series 7: FLAIR · axial · 5.0mm · 0.47mm/px · z∈[-76,+66]mm · 2 of 25 slices shown]
[im 1/25]
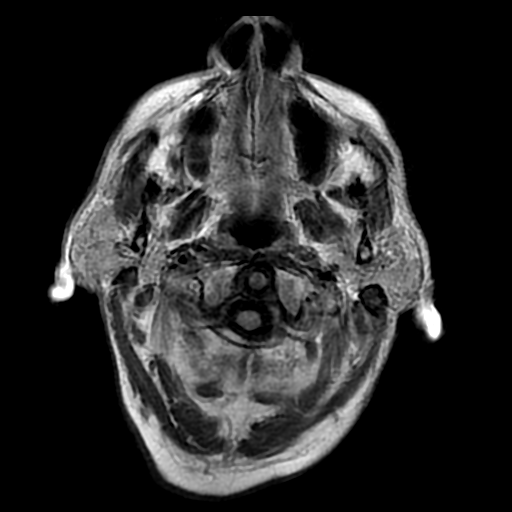
[im 25/25]
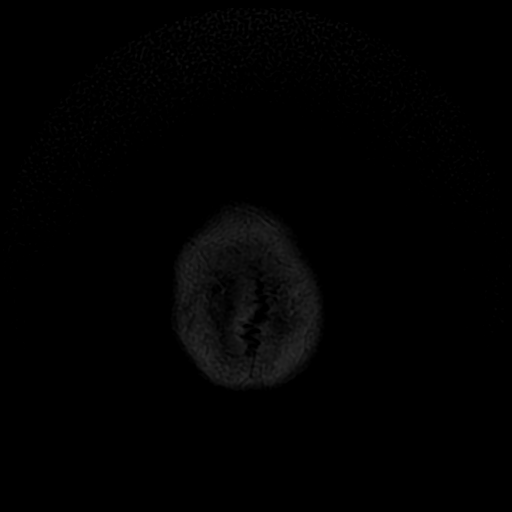

[Series 8: ax mpgr · axial · 5.0mm · 0.47mm/px · z∈[-77,+68]mm · 2 of 22 slices shown]
[im 1/22]
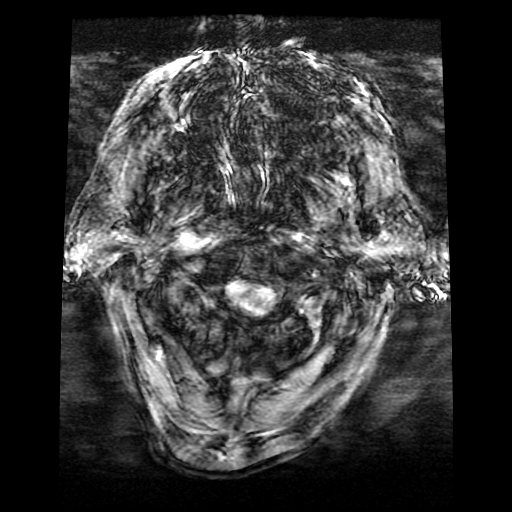
[im 22/22]
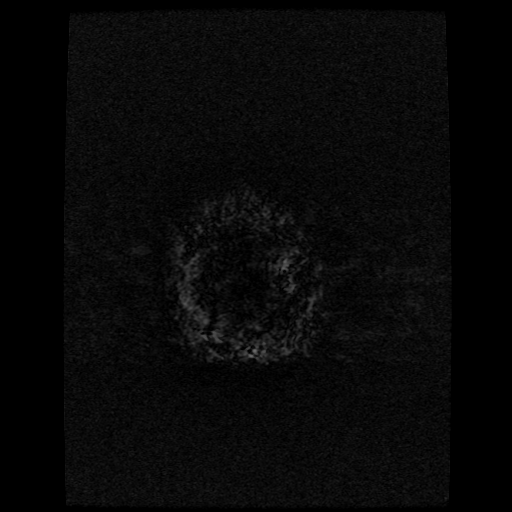

[Series 10: T2 · coronal · 5.0mm · 0.43mm/px · 3 of 33 slices shown (2 of 2)]
[im 1/33]
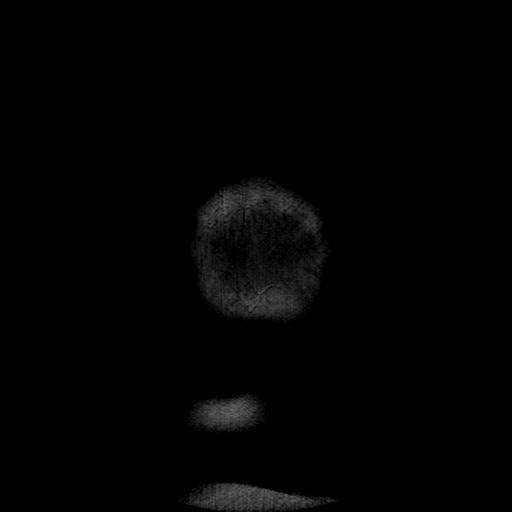
[im 17/33]
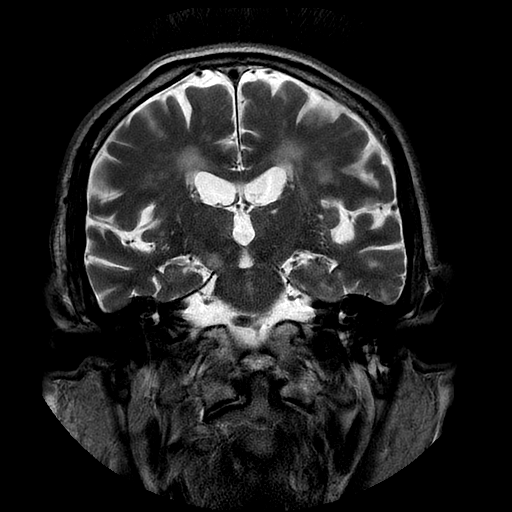
[im 33/33]
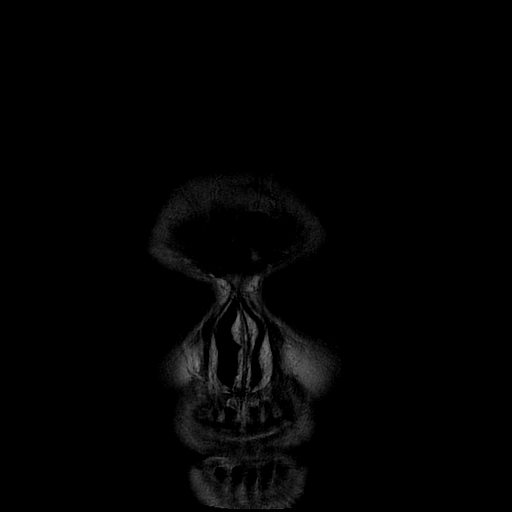

[Series 400: DWI · axial · 3.0mm · 1.09mm/px · z∈[-73,+70]mm · 5 of 49 slices shown (3 of 4)]
[im 1/49]
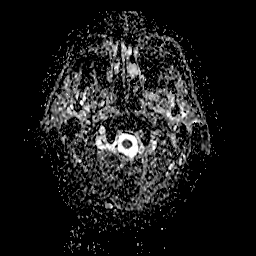
[im 13/49]
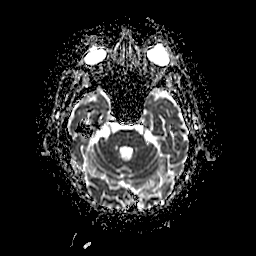
[im 25/49]
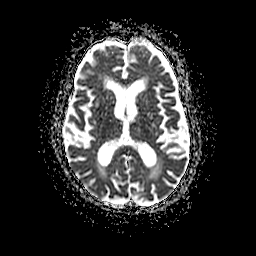
[im 37/49]
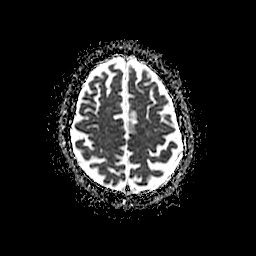
[im 49/49]
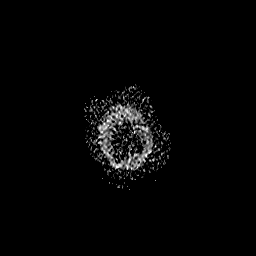

[Series 600: DWI · coronal · 5.0mm · 1.09mm/px · 4 of 38 slices shown (4 of 4)]
[im 1/38]
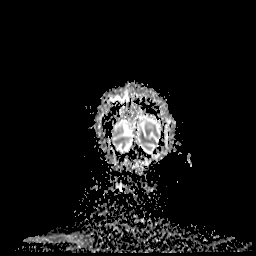
[im 13/38]
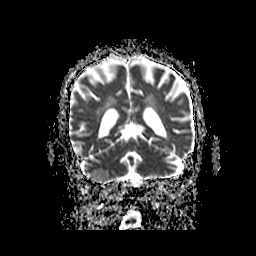
[im 25/38]
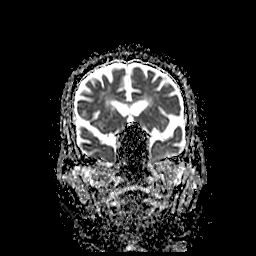
[im 38/38]
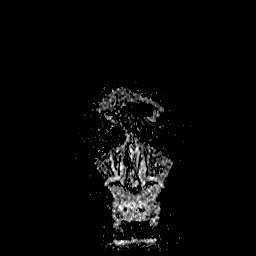

[36 of 48 positions shown; findings below may reference images not displayed]

FINDINGS: Brain: Diffusion imaging does not show any newly seen insult since
the study of 4 days ago. Expected evolutionary changes of recent
right cerebellar, right brainstem, right midbrain, right thalamus
and right occipital infarctions. Older infarctions within both
occipital lobes appear the same. Chronic small-vessel ischemic
changes throughout the deep and subcortical white matter appear the
same. No evidence of hemorrhage, mass lesion, hydrocephalus or
extra-axial collection.

Vascular: Major vessels at the base of the brain show flow.

Skull and upper cervical spine: Negative

Sinuses/Orbits: Clear/ normal. Small amount of fluid in the mastoid
air cells.

Other: None significant
IMPRESSION: No new insult since the previous study. Expected evolutionary
changes continue in the region of infarction in the right
cerebellum, right brainstem, right midbrain, right thalamus and
right occipital lobe. Older ischemic changes elsewhere appear the
same.
